# Patient Record
Sex: Female | Born: 1960 | Race: Black or African American | Hispanic: No | Marital: Married | Smoking: Never smoker
Health system: Southern US, Community
[De-identification: ages and names within clinical notes are randomized; demographics above are authoritative.]

## PROBLEM LIST (undated history)

## (undated) DIAGNOSIS — N809 Endometriosis, unspecified: Secondary | ICD-10-CM

## (undated) DIAGNOSIS — C801 Malignant (primary) neoplasm, unspecified: Secondary | ICD-10-CM

## (undated) DIAGNOSIS — D569 Thalassemia, unspecified: Secondary | ICD-10-CM

## (undated) DIAGNOSIS — I10 Essential (primary) hypertension: Secondary | ICD-10-CM

## (undated) HISTORY — PX: MYOMECTOMY: SHX85

## (undated) HISTORY — PX: DIAGNOSTIC LAPAROSCOPY: SUR761

---

## 2015-04-04 ENCOUNTER — Other Ambulatory Visit: Payer: Self-pay | Admitting: Gastroenterology

## 2015-04-04 DIAGNOSIS — K6389 Other specified diseases of intestine: Secondary | ICD-10-CM

## 2015-04-06 ENCOUNTER — Encounter (HOSPITAL_COMMUNITY): Payer: Self-pay | Admitting: Emergency Medicine

## 2015-04-06 ENCOUNTER — Telehealth: Payer: Self-pay | Admitting: Internal Medicine

## 2015-04-06 ENCOUNTER — Inpatient Hospital Stay (HOSPITAL_COMMUNITY)
Admission: EM | Admit: 2015-04-06 | Discharge: 2015-04-11 | DRG: 330 | Disposition: A | Discharge status: Home / Self Care | Payer: BLUE CROSS/BLUE SHIELD | Attending: Internal Medicine | Admitting: Internal Medicine

## 2015-04-06 ENCOUNTER — Emergency Department (HOSPITAL_COMMUNITY): Payer: BLUE CROSS/BLUE SHIELD

## 2015-04-06 ENCOUNTER — Inpatient Hospital Stay (HOSPITAL_COMMUNITY): Payer: BLUE CROSS/BLUE SHIELD

## 2015-04-06 DIAGNOSIS — R1110 Vomiting, unspecified: Secondary | ICD-10-CM

## 2015-04-06 DIAGNOSIS — Z886 Allergy status to analgesic agent status: Secondary | ICD-10-CM | POA: Diagnosis not present

## 2015-04-06 DIAGNOSIS — R1909 Other intra-abdominal and pelvic swelling, mass and lump: Secondary | ICD-10-CM | POA: Diagnosis present

## 2015-04-06 DIAGNOSIS — K660 Peritoneal adhesions (postprocedural) (postinfection): Secondary | ICD-10-CM | POA: Diagnosis present

## 2015-04-06 DIAGNOSIS — K769 Liver disease, unspecified: Secondary | ICD-10-CM

## 2015-04-06 DIAGNOSIS — D509 Iron deficiency anemia, unspecified: Secondary | ICD-10-CM | POA: Diagnosis present

## 2015-04-06 DIAGNOSIS — D259 Leiomyoma of uterus, unspecified: Secondary | ICD-10-CM | POA: Diagnosis present

## 2015-04-06 DIAGNOSIS — Z91011 Allergy to milk products: Secondary | ICD-10-CM

## 2015-04-06 DIAGNOSIS — C787 Secondary malignant neoplasm of liver and intrahepatic bile duct: Secondary | ICD-10-CM

## 2015-04-06 DIAGNOSIS — C19 Malignant neoplasm of rectosigmoid junction: Secondary | ICD-10-CM | POA: Diagnosis present

## 2015-04-06 DIAGNOSIS — C801 Malignant (primary) neoplasm, unspecified: Secondary | ICD-10-CM

## 2015-04-06 DIAGNOSIS — R1032 Left lower quadrant pain: Secondary | ICD-10-CM | POA: Diagnosis present

## 2015-04-06 DIAGNOSIS — I10 Essential (primary) hypertension: Secondary | ICD-10-CM | POA: Diagnosis present

## 2015-04-06 DIAGNOSIS — C189 Malignant neoplasm of colon, unspecified: Secondary | ICD-10-CM | POA: Diagnosis present

## 2015-04-06 DIAGNOSIS — N809 Endometriosis, unspecified: Secondary | ICD-10-CM | POA: Diagnosis present

## 2015-04-06 DIAGNOSIS — K625 Hemorrhage of anus and rectum: Secondary | ICD-10-CM | POA: Diagnosis present

## 2015-04-06 DIAGNOSIS — R1900 Intra-abdominal and pelvic swelling, mass and lump, unspecified site: Secondary | ICD-10-CM | POA: Diagnosis present

## 2015-04-06 DIAGNOSIS — D563 Thalassemia minor: Secondary | ICD-10-CM | POA: Diagnosis present

## 2015-04-06 DIAGNOSIS — K6389 Other specified diseases of intestine: Secondary | ICD-10-CM

## 2015-04-06 DIAGNOSIS — R630 Anorexia: Secondary | ICD-10-CM | POA: Diagnosis present

## 2015-04-06 DIAGNOSIS — Z79899 Other long term (current) drug therapy: Secondary | ICD-10-CM

## 2015-04-06 DIAGNOSIS — Z830 Family history of human immunodeficiency virus [HIV] disease: Secondary | ICD-10-CM | POA: Diagnosis not present

## 2015-04-06 DIAGNOSIS — C187 Malignant neoplasm of sigmoid colon: Secondary | ICD-10-CM

## 2015-04-06 DIAGNOSIS — E876 Hypokalemia: Secondary | ICD-10-CM | POA: Diagnosis present

## 2015-04-06 DIAGNOSIS — R112 Nausea with vomiting, unspecified: Secondary | ICD-10-CM

## 2015-04-06 DIAGNOSIS — D500 Iron deficiency anemia secondary to blood loss (chronic): Secondary | ICD-10-CM | POA: Diagnosis present

## 2015-04-06 DIAGNOSIS — D569 Thalassemia, unspecified: Secondary | ICD-10-CM | POA: Diagnosis present

## 2015-04-06 DIAGNOSIS — D649 Anemia, unspecified: Secondary | ICD-10-CM

## 2015-04-06 DIAGNOSIS — Z95828 Presence of other vascular implants and grafts: Secondary | ICD-10-CM

## 2015-04-06 DIAGNOSIS — Z800 Family history of malignant neoplasm of digestive organs: Secondary | ICD-10-CM

## 2015-04-06 HISTORY — DX: Thalassemia, unspecified: D56.9

## 2015-04-06 HISTORY — DX: Endometriosis, unspecified: N80.9

## 2015-04-06 HISTORY — DX: Essential (primary) hypertension: I10

## 2015-04-06 LAB — FOLATE: FOLATE: 32.7 ng/mL (ref >5.9)

## 2015-04-06 LAB — COMPREHENSIVE METABOLIC PANEL
ALT: 10 U/L — AB (ref 14–54)
AST: 22 U/L (ref 15–41)
Albumin: 3.9 g/dL (ref 3.5–5.0)
Alkaline Phosphatase: 63 U/L (ref 38–126)
Anion gap: 11 (ref 5–15)
BUN: 10 mg/dL (ref 6–20)
CHLORIDE: 104 mmol/L (ref 101–111)
CO2: 24 mmol/L (ref 22–32)
CREATININE: 0.9 mg/dL (ref 0.44–1.00)
Calcium: 9.5 mg/dL (ref 8.9–10.3)
GFR calc non Af Amer: >60 mL/min (ref >60)
Glucose, Bld: 105 mg/dL — ABNORMAL HIGH (ref 65–99)
Potassium: 3.2 mmol/L — ABNORMAL LOW (ref 3.5–5.1)
SODIUM: 139 mmol/L (ref 135–145)
Total Bilirubin: 0.8 mg/dL (ref 0.3–1.2)
Total Protein: 7.7 g/dL (ref 6.5–8.1)

## 2015-04-06 LAB — FERRITIN: Ferritin: 8 ng/mL — ABNORMAL LOW (ref 11–307)

## 2015-04-06 LAB — CBC WITH DIFFERENTIAL/PLATELET
BASOS ABS: 0 10*3/uL (ref 0.0–0.1)
Basophils Relative: 0 % (ref 0–1)
EOS ABS: 0.1 10*3/uL (ref 0.0–0.7)
Eosinophils Relative: 1 % (ref 0–5)
HCT: 32.6 % — ABNORMAL LOW (ref 36.0–46.0)
Hemoglobin: 9.6 g/dL — ABNORMAL LOW (ref 12.0–15.0)
LYMPHS PCT: 9 % — AB (ref 12–46)
Lymphs Abs: 0.7 10*3/uL (ref 0.7–4.0)
MCH: 17.5 pg — ABNORMAL LOW (ref 26.0–34.0)
MCHC: 29.4 g/dL — ABNORMAL LOW (ref 30.0–36.0)
MCV: 59.5 fL — ABNORMAL LOW (ref 78.0–100.0)
Monocytes Absolute: 0.3 10*3/uL (ref 0.1–1.0)
Monocytes Relative: 4 % (ref 3–12)
NEUTROS PCT: 86 % — AB (ref 43–77)
Neutro Abs: 6.6 10*3/uL (ref 1.7–7.7)
PLATELETS: 735 10*3/uL — AB (ref 150–400)
RBC: 5.48 MIL/uL — ABNORMAL HIGH (ref 3.87–5.11)
RDW: 29.4 % — ABNORMAL HIGH (ref 11.5–15.5)
WBC: 7.7 10*3/uL (ref 4.0–10.5)

## 2015-04-06 LAB — RETICULOCYTES: RBC.: 5.49 MIL/uL — ABNORMAL HIGH (ref 3.87–5.11)

## 2015-04-06 LAB — VITAMIN B12: VITAMIN B 12: 636 pg/mL (ref 180–914)

## 2015-04-06 LAB — LIPASE, BLOOD: Lipase: 15 U/L — ABNORMAL LOW (ref 22–51)

## 2015-04-06 LAB — IRON AND TIBC
Iron: 17 ug/dL — ABNORMAL LOW (ref 28–170)
Saturation Ratios: 4 % — ABNORMAL LOW (ref 10.4–31.8)
TIBC: 406 ug/dL (ref 250–450)
UIBC: 389 ug/dL

## 2015-04-06 LAB — AMMONIA: AMMONIA: 17 umol/L (ref 9–35)

## 2015-04-06 MED ORDER — SODIUM CHLORIDE 0.9 % IV SOLN: INTRAVENOUS | Status: DC

## 2015-04-06 MED ORDER — HYDROCODONE-ACETAMINOPHEN 5-325 MG PO TABS: 1.0000 | ORAL_TABLET | ORAL | Status: DC | PRN

## 2015-04-06 MED ORDER — ONDANSETRON HCL 4 MG PO TABS
4.0000 mg | ORAL_TABLET | Freq: Four times a day (QID) | ORAL | Status: DC | PRN
  Filled 2015-04-06: qty 1

## 2015-04-06 MED ORDER — ONDANSETRON HCL 4 MG/2ML IJ SOLN: 4.0000 mg | Freq: Three times a day (TID) | INTRAMUSCULAR | Status: DC | PRN

## 2015-04-06 MED ORDER — ACETAMINOPHEN 650 MG RE SUPP: 650.0000 mg | Freq: Four times a day (QID) | RECTAL | Status: DC | PRN

## 2015-04-06 MED ORDER — IOHEXOL 300 MG/ML  SOLN
100.0000 mL | Freq: Once | INTRAMUSCULAR | Status: AC | PRN
  Administered 2015-04-06: 100 mL via INTRAVENOUS

## 2015-04-06 MED ORDER — HYDROMORPHONE HCL 1 MG/ML IJ SOLN: 1.0000 mg | INTRAMUSCULAR | Status: DC | PRN

## 2015-04-06 MED ORDER — NIFEDIPINE ER 30 MG PO TB24
30.0000 mg | ORAL_TABLET | Freq: Every day | ORAL | Status: DC
  Administered 2015-04-07 – 2015-04-10 (×3): 30 mg via ORAL
  Filled 2015-04-06 (×5): qty 1

## 2015-04-06 MED ORDER — ONDANSETRON HCL 4 MG/2ML IJ SOLN
4.0000 mg | Freq: Once | INTRAMUSCULAR | Status: AC
  Administered 2015-04-06: 4 mg via INTRAVENOUS
  Filled 2015-04-06: qty 2

## 2015-04-06 MED ORDER — SODIUM CHLORIDE 0.9 % IV SOLN
INTRAVENOUS | Status: DC
  Administered 2015-04-06 – 2015-04-07 (×3): via INTRAVENOUS

## 2015-04-06 MED ORDER — ONDANSETRON HCL 4 MG/2ML IJ SOLN
4.0000 mg | Freq: Four times a day (QID) | INTRAMUSCULAR | Status: DC | PRN
  Administered 2015-04-07 (×3): 4 mg via INTRAVENOUS
  Filled 2015-04-06 (×3): qty 2

## 2015-04-06 MED ORDER — POTASSIUM CHLORIDE 10 MEQ/100ML IV SOLN
10.0000 meq | INTRAVENOUS | Status: AC
  Administered 2015-04-06 (×3): 10 meq via INTRAVENOUS
  Filled 2015-04-06 (×2): qty 100

## 2015-04-06 MED ORDER — SODIUM CHLORIDE 0.9 % IJ SOLN
3.0000 mL | Freq: Two times a day (BID) | INTRAMUSCULAR | Status: DC
  Administered 2015-04-07 – 2015-04-09 (×3): 3 mL via INTRAVENOUS

## 2015-04-06 MED ORDER — SODIUM CHLORIDE 0.9 % IV BOLUS (SEPSIS)
1000.0000 mL | Freq: Once | INTRAVENOUS | Status: AC
  Administered 2015-04-06: 1000 mL via INTRAVENOUS

## 2015-04-06 MED ORDER — IOHEXOL 300 MG/ML  SOLN
50.0000 mL | Freq: Once | INTRAMUSCULAR | Status: AC | PRN
  Administered 2015-04-06: 50 mL via ORAL

## 2015-04-06 MED ORDER — ACETAMINOPHEN 325 MG PO TABS
650.0000 mg | ORAL_TABLET | Freq: Four times a day (QID) | ORAL | Status: DC | PRN
  Administered 2015-04-06 – 2015-04-09 (×3): 650 mg via ORAL
  Filled 2015-04-06 (×4): qty 2

## 2015-04-06 NOTE — Consult Note (Signed)
[place]  [phone]) [phone] [phone]  Inpatient New Consult Note   Patient Care Team: Benito Mccreedy, MD as PCP - General (Internal Medicine) Juanita Craver, MD as Consulting Physician (Gastroenterology) 04/06/2015  Referring physician: Dr. Tana Coast  CHIEF COMPLAINTS/PURPOSE OF CONSULTATION:  Rectal bleeding  HISTORY OF PRESENTING ILLNESS:  Christina [name] 54 y.o. female with past medical history of endometriosis, uterine fibroids, thalassemia trait, iron deficient anemia, who presents to the [facility] emergency room for worsening nausea and vomiting. She was recently found to have a sigmoid rectal mass.   She has been having intermittent rectal bleeding for the past 3 months. She thought it was related to her endometriosis, did not seek medical attention. She also has intermittent mild nausea, especially in the morning, and left lower quadrant abdominal pain. She was found to have profound anemia with hemoglobin 6.5 last week, and received blood transfusion. She was referred to gastroenterologist Dr. Collene Mares last week, underwent colonoscopy 2 days ago, which showed a obstructing sigmoid rectal mass, per patient, the colonoscopy report is not available today. Biopsy was done, but the pathology report is still pending.  She developed severe nausea, and vomited several times with clear gastric liquid. She called [name], and I was told to come to [facility] emergency room by on-call physician Dr. Carlean Purl.  She had a CT scan done in the emergency room today.  Her appetite was fairly normal up to 2 days ago, when she was found to have a colorectal mass. She lost about 45 pounds in the past few weeks. 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  family history was positive for colon cancer in her maternal cousin. She is married, lives with her husband, no children.  MEDICAL HISTORY:  Past Medical History    Diagnosis Date  . Hypertension   . Endometriosis   . Thalassanemia     SURGICAL HISTORY: History reviewed. No pertinent past surgical history.  SOCIAL HISTORY: Social History   Social History  . Marital Status: Married    Spouse Name: N/A  . Number of Children: N/A  . Years of Education: N/A   Occupational History  . Not on file.   Social History Main Topics  . Smoking status: Never Smoker   . Smokeless tobacco: Not on file  . Alcohol Use: Yes  . Drug Use: No  . Sexual Activity: Not on file   Other Topics Concern  . Not on file   Social History Narrative  . No narrative on file    FAMILY HISTORY: History reviewed. No pertinent family history.  ALLERGIES:  is allergic to milk-related compounds and nsaids.  MEDICATIONS:  Current Facility-Administered Medications  Medication Dose Route Frequency Provider Last Rate Last Dose  . 0.9 %  sodium chloride infusion   Intravenous Continuous Ripudeep Krystal Eaton, MD 75 mL/hr at 04/06/15 1424    . acetaminophen (TYLENOL) tablet 650 mg  650 mg Oral Q6H PRN Ripudeep Krystal Eaton, MD   650 mg at 04/06/15 1522   Or  . acetaminophen (TYLENOL) suppository 650 mg  650 mg Rectal Q6H PRN Ripudeep Krystal Eaton, MD      . HYDROcodone-acetaminophen (NORCO/VICODIN) 5-325 MG per tablet 1 tablet  1 tablet Oral Q4H PRN Ripudeep K Rai, MD      . HYDROmorphone (DILAUDID) injection 1 mg  1 mg Intravenous Q4H PRN Ripudeep Krystal Eaton, MD      . Derrill Memo ON 04/07/2015] NIFEdipine (  PROCARDIA-XL/ADALAT CC) 24 hr tablet 30 mg  30 mg Oral Daily Ripudeep K Rai, MD      . ondansetron (ZOFRAN) tablet 4 mg  4 mg Oral Q6H PRN Ripudeep Krystal Eaton, MD       Or  . ondansetron (ZOFRAN) injection 4 mg  4 mg Intravenous Q6H PRN Ripudeep K Rai, MD      . potassium chloride 10 mEq in 100 mL IVPB  10 mEq Intravenous Q1 Hr x 3 Ripudeep K Rai, MD 100 mL/hr at 04/06/15 1514 10 mEq at 04/06/15 1514  . sodium chloride 0.9 % injection 3 mL  3 mL Intravenous Q12H Ripudeep K Rai, MD   3 mL at 04/06/15  1430    REVIEW OF SYSTEMS:   Constitutional: Denies fevers, chills or abnormal night sweats Eyes: Denies blurriness of vision, double vision or watery eyes Ears, nose, mouth, throat, and face: Denies mucositis or sore throat Respiratory: Denies cough, dyspnea or wheezes Cardiovascular: Denies palpitation, chest discomfort or lower extremity swelling Gastrointestinal:  Denies nausea, heartburn or change in bowel habits Skin: Denies abnormal skin rashes Lymphatics: Denies new lymphadenopathy or easy bruising Neurological:Denies numbness, tingling or new weaknesses Behavioral/Psych: Mood is stable, no new changes  All other systems were reviewed with the patient and are negative.  PHYSICAL EXAMINATION: ECOG PERFORMANCE STATUS: 1 - Symptomatic but completely ambulatory  Filed Vitals:   04/06/15 1330  BP: 147/72  Pulse: 78  Temp: 98.6 F (37 C)  Resp: 20   Filed Weights   04/06/15 0912 04/06/15 1330  Weight: 160 lb (72.576 kg) 160 lb (72.576 kg)    GENERAL:alert, no distress and comfortable SKIN: skin color, texture, turgor are normal, no rashes or significant lesions EYES: normal, conjunctiva are pink and non-injected, sclera clear OROPHARYNX:no exudate, no erythema and lips, buccal mucosa, and tongue normal  NECK: supple, thyroid normal size, non-tender, without nodularity LYMPH:  no palpable lymphadenopathy in the cervical, axillary or inguinal LUNGS: clear to auscultation and percussion with normal breathing effort HEART: regular rate & rhythm and no murmurs and no lower extremity edema ABDOMEN:abdomen soft, non-tender and normal bowel sounds Musculoskeletal:no cyanosis of digits and no clubbing  PSYCH: alert & oriented x 3 with fluent speech NEURO: no focal motor/sensory deficits  LABORATORY DATA:  I have reviewed the data as listed Lab Results  Component Value Date   WBC 7.7 04/06/2015   HGB 9.6* 04/06/2015   HCT 32.6* 04/06/2015   MCV 59.5* 04/06/2015   PLT 735*  04/06/2015    Recent Labs  04/06/15 0945  NA 139  K 3.2*  CL 104  CO2 24  GLUCOSE 105*  BUN 10  CREATININE 0.90  CALCIUM 9.5  GFRNONAA >60  GFRAA >60  PROT 7.7  ALBUMIN 3.9  AST 22  ALT 10*  ALKPHOS 63  BILITOT 0.8    RADIOGRAPHIC STUDIES: I have personally reviewed the radiological images as listed and agreed with the findings in the report. Ct Chest W Contrast  04/06/2015   ADDENDUM REPORT: 04/06/2015 13:18  ADDENDUM: Upon further evaluation, The masslike lesion in the pelvis previously question matted small bowel loops represent 9 cm sigmoid colon cancer.   Electronically Signed   By: Abelardo Diesel M.D.   On: 04/06/2015 13:18   04/06/2015   CLINICAL DATA:  Vomiting.  EXAM: CT CHEST, ABDOMEN, AND PELVIS WITH CONTRAST  TECHNIQUE: Multidetector CT imaging of the chest, abdomen and pelvis was performed following the standard protocol during bolus administration of intravenous  contrast.  CONTRAST:  24mL OMNIPAQUE IOHEXOL 300 MG/ML SOLN, 158mL OMNIPAQUE IOHEXOL 300 MG/ML SOLN  COMPARISON:  None.  FINDINGS: CT CHEST FINDINGS  Mediastinum/Nodes: There is no mediastinal or hilar lymphadenopathy. There is no mediastinal hematoma. The heart size is normal. There is no pericardial effusion.  Lungs/Pleura: There is no pulmonary mass or nodule. No focal pneumonia or pleural effusion is identified.  Musculoskeletal: No acute abnormality is identified within the visualized bones.  CT ABDOMEN PELVIS FINDINGS  Hepatobiliary: There is a 9.1 x 6.1 cm heterogeneously enhancing mass in the left lobe liver. There is a 0.9 x 1.6 cm mild enhancing mass in the dome of the liver. There is a 1.9 x 1.6 cm enhancing mass at the junction of caudate and right lobe liver. A 3 mm low density lesion is identified in the inferior left lobe liver. The gallbladder is normal.  Pancreas: Normal.  Spleen: Normal.  Adrenals/Urinary Tract: The adrenal glands are normal. They kidneys are normal. There is no nephrolithiasis or  hydroureteronephrosis bilaterally.  Stomach/Bowel: There is a question 5.8 cm conglomerate of matted bowel loops in the mid lower abdomen. There is no small bowel obstruction. The colon is normal.  Vascular/Lymphatic: The aorta is normal. There is no aneurysmal dilatation of the aorta. There is no abdominal lymphadenopathy.  Reproductive: There is a 6 x 6.9 cm heterogeneously enhancing mass question arising from the uterus. A second mass, inseparable from the uterus that is lower density than adjacent uterine myometrium measures 5.7 x 7.8 cm.  Other: None.  Musculoskeletal: No focal discrete lytic or blastic lesion is identified within the visualized bones.  IMPRESSION: No acute abnormality identified within the chest. No evidence of metastatic disease.  Several enhancing masses in the liver consistent with liver metastasis.  Masses within the pelvis likely of the Uterine origin. Given the findings within the liver, malignant neoplasm is not excluded.  A conglomerate of matted bowel loops in the mid lower abdomen. There is no evidence of small bowel obstruction.  Electronically Signed: By: Abelardo Diesel M.D. On: 04/06/2015 11:49   Ct Abdomen Pelvis W Contrast  04/06/2015   ADDENDUM REPORT: 04/06/2015 13:18  ADDENDUM: Upon further evaluation, The masslike lesion in the pelvis previously question matted small bowel loops represent 9 cm sigmoid colon cancer.   Electronically Signed   By: Abelardo Diesel M.D.   On: 04/06/2015 13:18   04/06/2015   CLINICAL DATA:  Vomiting.  EXAM: CT CHEST, ABDOMEN, AND PELVIS WITH CONTRAST  TECHNIQUE: Multidetector CT imaging of the chest, abdomen and pelvis was performed following the standard protocol during bolus administration of intravenous contrast.  CONTRAST:  23mL OMNIPAQUE IOHEXOL 300 MG/ML SOLN, 144mL OMNIPAQUE IOHEXOL 300 MG/ML SOLN  COMPARISON:  None.  FINDINGS: CT CHEST FINDINGS  Mediastinum/Nodes: There is no mediastinal or hilar lymphadenopathy. There is no mediastinal  hematoma. The heart size is normal. There is no pericardial effusion.  Lungs/Pleura: There is no pulmonary mass or nodule. No focal pneumonia or pleural effusion is identified.  Musculoskeletal: No acute abnormality is identified within the visualized bones.  CT ABDOMEN PELVIS FINDINGS  Hepatobiliary: There is a 9.1 x 6.1 cm heterogeneously enhancing mass in the left lobe liver. There is a 0.9 x 1.6 cm mild enhancing mass in the dome of the liver. There is a 1.9 x 1.6 cm enhancing mass at the junction of caudate and right lobe liver. A 3 mm low density lesion is identified in the inferior left lobe liver. The gallbladder  is normal.  Pancreas: Normal.  Spleen: Normal.  Adrenals/Urinary Tract: The adrenal glands are normal. They kidneys are normal. There is no nephrolithiasis or hydroureteronephrosis bilaterally.  Stomach/Bowel: There is a question 5.8 cm conglomerate of matted bowel loops in the mid lower abdomen. There is no small bowel obstruction. The colon is normal.  Vascular/Lymphatic: The aorta is normal. There is no aneurysmal dilatation of the aorta. There is no abdominal lymphadenopathy.  Reproductive: There is a 6 x 6.9 cm heterogeneously enhancing mass question arising from the uterus. A second mass, inseparable from the uterus that is lower density than adjacent uterine myometrium measures 5.7 x 7.8 cm.  Other: None.  Musculoskeletal: No focal discrete lytic or blastic lesion is identified within the visualized bones.  IMPRESSION: No acute abnormality identified within the chest. No evidence of metastatic disease.  Several enhancing masses in the liver consistent with liver metastasis.  Masses within the pelvis likely of the Uterine origin. Given the findings within the liver, malignant neoplasm is not excluded.  A conglomerate of matted bowel loops in the mid lower abdomen. There is no evidence of small bowel obstruction.  Electronically Signed: By: Abelardo Diesel M.D. On: 04/06/2015 11:49     ASSESSMENT & PLAN:   54 year old African-American female, with past medical history of hypertension, thalassemia trait, iron deficient anemia, no history of uterine fibroids, endometriosis, who presents with intermittent rectal bleeding, mild nausea, and mild intermittent left lower quadrant abdominal pain.   1. 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 Sharrie Rothman today. The large pelvic masses are likely uterine or fibroids, benign. The sigmoid rectal mass measures about 3.4 x 7 cm, with extranodal chronic extension. Her liver lesions are very concerning for metastasis. -I'll obtain her sigmoid colon mass biopsy result tomorrow. Although this is likely adenocarcinoma, neuroendocrine tumor is also a small possibility. -We have checked her tumor markers including CEA. -I recommend liver biopsy to confirm the liver metastasis. -We discussed the indication for surgery in stage IV colon cancer, including obstruction and severe bleeding. She does not have images findings or clinical symptom of bowel obstruction, but Dr. Collene Mares was not able to pass the scope during the colonoscopy, she may need standing or pedal surgery if she develops clinical symptoms of obstruction. -We also discussed palliative radiation can be considered for bleeding and obstruction. -Given the likely stage IV disease, she would need systemic chemotherapy. I'll wait for the final pathology result, and go over the chemotherapy regimen with her.  2. Anemia -Secondary to GI bleeding and iron deficiency -Ferritin level is pending, I will likely give her dose of ferriheme 510 mg when she is in hospital  Recommendations -I'll discuss with Dr. Shane Crutch, and GI to see if she would need stenting or surgery for her obstructing sigmoid rectal mass -liver biopsy by IR to confirm metastasis  -I will follow her colon mass biopsy pathology result -if biopsy confirmed  adenocarcinoma, I would need a port placement to start her chemotherapy -If ferritin is low, please give 1 dose of ferriheme 510 mg  I will follow up closely  All questions were answered. The patient knows to call the clinic with any problems, questions or concerns. I spent 55 minutes counseling the patient face to face. The total time spent in the appointment was 60 minutes and more than 50% was on counseling.     Truitt Merle, MD 04/06/2015 4:07 PM

## 2015-04-06 NOTE — Telephone Encounter (Signed)
Called [phone] - has had 3 episodes of vomiting last night into this AM, no bleeding, mild abd pain, no fever  Had colonoscopy 2 d ago Dr. Collene Mares  Per pt - scope would not pass through a mass   Told her to go to [facility] ED for eval

## 2015-04-06 NOTE — ED Notes (Signed)
Patient c/o vomiting that started last pm, patient reports that she is having "soft stools", and pain in her lt lower abd and lower back. Patient had colonoscopy on Friday that showed a mass in her sigmoid colon. Patient was advised by MD on call at [facility] Endoscopy clinic to come to ED.

## 2015-04-06 NOTE — H&P (Signed)
History and Physical        Hospital Admission Note Date: 04/06/2015  Patient name: Christina [name] Medical record number: [id number] Date of birth: 04/19/1960 Age: 54 y.o. Gender: female  PCP: No primary care provider on file.  Referring physician: Dr Audie Pinto   Chief Complaint:  Nausea, vomiting, rectal bleeding  HPI: Patient is a 54 year old female with hypertension, endometriosis, chronic anemia presented to ED with complaints of intractable nausea and vomiting that started last night. History was obtained from the patient and her husband in the room. Patient reported that she has been having rectal bleeding off and on for last 3 months. Patient was seen by gastroenterology, Dr. Collene Mares, underwent colonoscopy on 8/26 and Dr. Collene Mares was unable to pass the scope through the large nearly obstructing mass. Dr. Collene Mares had contacted surgery, biopsies were obtained of the mass, patient was scheduled to have CT scan of the abdomen on 8/29. However overnight patient started having several episodes of nausea and vomiting and presented to [facility] after the recommendations from Dr. Carlean Purl for further workup. At the time of my examination, patient complains of left lower quadrant abdominal pain, 6/10, not radiating, associated with nausea. Currently no vomiting, fevers or chills. Patient reports that she had loss of appetite in the last 1 week and may have lost about 5 pounds. She has a history of colon cancer in one of her cousins, otherwise not in her immediate family and siblings. ED workup showed sodium of 139, potassium 3.2, creatinine 0.9 every BC 7.7 hemoglobin 9.6, platelets 735. Per Dr. Collene Mares, patient had received 2 units of packed RBC transfusion last week in [place] for the hemoglobin of 6.2, confirmed from the patient. CT of the abdomen/pelvis showed several enhancing masses in the  liver consistent with liver metastases, masses in the pelvis likely of uterine origin, 9 cm sigmoid colon mass.    Review of Systems:  Constitutional: Denies fever, chills, diaphoresis, + poor appetite and fatigue.  HEENT: Denies photophobia, eye pain, redness, hearing loss, ear pain, congestion, sore throat, rhinorrhea, sneezing, mouth sores, trouble swallowing, neck pain, neck stiffness and tinnitus.   Respiratory: Denies SOB, DOE, cough, chest tightness,  and wheezing.   Cardiovascular: Denies chest pain, palpitations and leg swelling.  Gastrointestinal: Please see history of present illness  Genitourinary: Denies dysuria, urgency, frequency, hematuria, flank pain and difficulty urinating.  Musculoskeletal: Denies myalgias, back pain, joint swelling, arthralgias and gait problem.  Skin: Denies pallor, rash and wound.  Neurological: Denies dizziness, seizures, syncope, weakness, light-headedness, numbness and headaches.  Hematological: Denies adenopathy. Easy bruising, personal or family bleeding history  Psychiatric/Behavioral: Denies suicidal ideation, mood changes, confusion, nervousness, sleep disturbance and agitation  Past Medical History: Past Medical History  Diagnosis Date  . Hypertension   . Endometriosis   . Thalassanemia     History reviewed. No pertinent past surgical history.  Medications: Prior to Admission medications   Medication Sig Start Date End Date Taking? Authorizing Provider  NIFEdipine (PROCARDIA-XL/ADALAT CC) 30 MG 24 hr tablet Take 30 mg by mouth daily.   Yes Historical Provider, MD    Allergies:   Allergies  Allergen Reactions  . Milk-Related Compounds Diarrhea  .  Nsaids Other (See Comments)    Heavy menstrual bleeding     Social History:  reports that she has never smoked. She does not have any smokeless tobacco history on file. She reports that she drinks alcohol occasionally. She reports that she does not use illicit drugs.  Family  History: Patient reports no history of any GI or reproductive cancers in her immediate family or siblings. History of colon cancer in one of her cousins.  Physical Exam: Blood pressure 147/75, pulse 80, temperature 98.2 F (36.8 C), temperature source Oral, resp. rate 18, height 5\' 6"  (1.676 m), weight 72.576 kg (160 lb), SpO2 100 %. General: Alert, awake, oriented x3, in no acute distress. HEENT: normocephalic, atraumatic, anicteric sclera, pink conjunctiva, pupils equal and reactive to light and accomodation, oropharynx clear Neck: supple, no masses or lymphadenopathy, no goiter, no bruits  Heart: Regular rate and rhythm, without murmurs, rubs or gallops. Lungs: Clear to auscultation bilaterally, no wheezing, rales or rhonchi. Abdomen: Soft, mild tenderness in the left lower quadrant to deep palpation, nondistended, positive bowel sounds, no masses. Extremities: No clubbing, cyanosis or edema with positive pedal pulses. Neuro: Grossly intact, no focal neurological deficits, strength 5/5 upper and lower extremities bilaterally Psych: alert and oriented x 3, normal mood and affect Skin: no rashes or lesions, warm and dry   LABS on Admission:  Basic Metabolic Panel:  Recent Labs Lab 04/06/15 0945  NA 139  K 3.2*  CL 104  CO2 24  GLUCOSE 105*  BUN 10  CREATININE 0.90  CALCIUM 9.5   Liver Function Tests:  Recent Labs Lab 04/06/15 0945  AST 22  ALT 10*  ALKPHOS 63  BILITOT 0.8  PROT 7.7  ALBUMIN 3.9    Recent Labs Lab 04/06/15 0945  LIPASE 15*   No results for input(s): AMMONIA in the last 168 hours. CBC:  Recent Labs Lab 04/06/15 0945  WBC 7.7  NEUTROABS 6.6  HGB 9.6*  HCT 32.6*  MCV 59.5*  PLT 735*   Cardiac Enzymes: No results for input(s): CKTOTAL, CKMB, CKMBINDEX, TROPONINI in the last 168 hours. BNP: Invalid input(s): POCBNP CBG: No results for input(s): GLUCAP in the last 168 hours.  Radiological Exams on Admission:  Ct Chest W  Contrast  04/06/2015   ADDENDUM REPORT: 04/06/2015 13:18  ADDENDUM: Upon further evaluation, The masslike lesion in the pelvis previously question matted small bowel loops represent 9 cm sigmoid colon cancer.   Electronically Signed   By: Abelardo Diesel M.D.   On: 04/06/2015 13:18   04/06/2015   CLINICAL DATA:  Vomiting.  EXAM: CT CHEST, ABDOMEN, AND PELVIS WITH CONTRAST  TECHNIQUE: Multidetector CT imaging of the chest, abdomen and pelvis was performed following the standard protocol during bolus administration of intravenous contrast.  CONTRAST:  68mL OMNIPAQUE IOHEXOL 300 MG/ML SOLN, 127mL OMNIPAQUE IOHEXOL 300 MG/ML SOLN  COMPARISON:  None.  FINDINGS: CT CHEST FINDINGS  Mediastinum/Nodes: There is no mediastinal or hilar lymphadenopathy. There is no mediastinal hematoma. The heart size is normal. There is no pericardial effusion.  Lungs/Pleura: There is no pulmonary mass or nodule. No focal pneumonia or pleural effusion is identified.  Musculoskeletal: No acute abnormality is identified within the visualized bones.  CT ABDOMEN PELVIS FINDINGS  Hepatobiliary: There is a 9.1 x 6.1 cm heterogeneously enhancing mass in the left lobe liver. There is a 0.9 x 1.6 cm mild enhancing mass in the dome of the liver. There is a 1.9 x 1.6 cm enhancing mass at the junction  of caudate and right lobe liver. A 3 mm low density lesion is identified in the inferior left lobe liver. The gallbladder is normal.  Pancreas: Normal.  Spleen: Normal.  Adrenals/Urinary Tract: The adrenal glands are normal. They kidneys are normal. There is no nephrolithiasis or hydroureteronephrosis bilaterally.  Stomach/Bowel: There is a question 5.8 cm conglomerate of matted bowel loops in the mid lower abdomen. There is no small bowel obstruction. The colon is normal.  Vascular/Lymphatic: The aorta is normal. There is no aneurysmal dilatation of the aorta. There is no abdominal lymphadenopathy.  Reproductive: There is a 6 x 6.9 cm heterogeneously  enhancing mass question arising from the uterus. A second mass, inseparable from the uterus that is lower density than adjacent uterine myometrium measures 5.7 x 7.8 cm.  Other: None.  Musculoskeletal: No focal discrete lytic or blastic lesion is identified within the visualized bones.  IMPRESSION: No acute abnormality identified within the chest. No evidence of metastatic disease.  Several enhancing masses in the liver consistent with liver metastasis.  Masses within the pelvis likely of the Uterine origin. Given the findings within the liver, malignant neoplasm is not excluded.  A conglomerate of matted bowel loops in the mid lower abdomen. There is no evidence of small bowel obstruction.  Electronically Signed: By: Abelardo Diesel M.D. On: 04/06/2015 11:49   Ct Abdomen Pelvis W Contrast  04/06/2015   ADDENDUM REPORT: 04/06/2015 13:18  ADDENDUM: Upon further evaluation, The masslike lesion in the pelvis previously question matted small bowel loops represent 9 cm sigmoid colon cancer.   Electronically Signed   By: Abelardo Diesel M.D.   On: 04/06/2015 13:18   04/06/2015   CLINICAL DATA:  Vomiting.  EXAM: CT CHEST, ABDOMEN, AND PELVIS WITH CONTRAST  TECHNIQUE: Multidetector CT imaging of the chest, abdomen and pelvis was performed following the standard protocol during bolus administration of intravenous contrast.  CONTRAST:  2mL OMNIPAQUE IOHEXOL 300 MG/ML SOLN, 1104mL OMNIPAQUE IOHEXOL 300 MG/ML SOLN  COMPARISON:  None.  FINDINGS: CT CHEST FINDINGS  Mediastinum/Nodes: There is no mediastinal or hilar lymphadenopathy. There is no mediastinal hematoma. The heart size is normal. There is no pericardial effusion.  Lungs/Pleura: There is no pulmonary mass or nodule. No focal pneumonia or pleural effusion is identified.  Musculoskeletal: No acute abnormality is identified within the visualized bones.  CT ABDOMEN PELVIS FINDINGS  Hepatobiliary: There is a 9.1 x 6.1 cm heterogeneously enhancing mass in the left lobe  liver. There is a 0.9 x 1.6 cm mild enhancing mass in the dome of the liver. There is a 1.9 x 1.6 cm enhancing mass at the junction of caudate and right lobe liver. A 3 mm low density lesion is identified in the inferior left lobe liver. The gallbladder is normal.  Pancreas: Normal.  Spleen: Normal.  Adrenals/Urinary Tract: The adrenal glands are normal. They kidneys are normal. There is no nephrolithiasis or hydroureteronephrosis bilaterally.  Stomach/Bowel: There is a question 5.8 cm conglomerate of matted bowel loops in the mid lower abdomen. There is no small bowel obstruction. The colon is normal.  Vascular/Lymphatic: The aorta is normal. There is no aneurysmal dilatation of the aorta. There is no abdominal lymphadenopathy.  Reproductive: There is a 6 x 6.9 cm heterogeneously enhancing mass question arising from the uterus. A second mass, inseparable from the uterus that is lower density than adjacent uterine myometrium measures 5.7 x 7.8 cm.  Other: None.  Musculoskeletal: No focal discrete lytic or blastic lesion is identified within the  visualized bones.  IMPRESSION: No acute abnormality identified within the chest. No evidence of metastatic disease.  Several enhancing masses in the liver consistent with liver metastasis.  Masses within the pelvis likely of the Uterine origin. Given the findings within the liver, malignant neoplasm is not excluded.  A conglomerate of matted bowel loops in the mid lower abdomen. There is no evidence of small bowel obstruction.  Electronically Signed: By: Abelardo Diesel M.D. On: 04/06/2015 11:49    *I have personally reviewed the images above*    Assessment/Plan  Principal Problem:   Nausea & vomiting with colon mass newly diagnosed with liver metastasis - will admit for further workup, placed on clear liquid diet for now, IV fluid hydration, antiemetics - Discussed in detail with Dr. Collene Mares, Unable to read endoscopy report on the Epic. Per Dr. Collene Mares, patient had the  flexible sigmoidoscopy on 8/26 and had large nearly obstructing mass ~10cm in the rectosigmoid colon. Biopsies have been obtained. Dr. Collene Mares had already contacted general surgery to be seen on 8/31.  - I have consulted oncology, spoke with Dr. Burr Medico and general surgery, spoke with Dr. Lucia Gaskins  - There is a question of pelvic masses separately from the colon on the CT scan, I have ordered a pelvic and transvaginal ultrasound for further workup. Patient has long-standing history of endometriosis and fibroids - Ordered CEA, CA-19-9, CEA 125   Active Problems:   Pelvic mass, Liver metastasis - Patient has a long-standing history of endometriosis and fibroids, follow transvaginal and pelvic ultrasound for further workup to rule out separate GU malignancy     Hypokalemia likely due to nausea and vomiting - Replaced IV     Anemia - Patient appears to have anemia, but Dr. Collene Mares, had received 2 units of packed RBC transfusion last week with for a hemoglobin of 6.2  - Obtain anemia panel for further workup    essential hypertension Continue nifedipine   DVT prophylaxis:  SCDs   CODE STATUS:  full code   Family Communication: Admission, patients condition and plan of care including tests being ordered have been discussed with the patient and  Husband who indicates understanding and agree with the plan and Code Status  Disposition plan: Further plan will depend as patient's clinical course evolves and further radiologic and laboratory data become available.   Time Spent on Admission: 61 minutes   Trevar Boehringer M.D. [facility] 04/06/2015, 1:29 PM Pager: [phone]  If 7PM-7AM, please contact night-coverage [email or web address] Password [email or web address]

## 2015-04-06 NOTE — Progress Notes (Signed)
   Brief note - I spoke to patient on phone and had her come to ED. Obstructing sigmoid tumor - patient has report  CT suggests uterine and liver masses - ? Fibroids and liver mets from [facility] more likely [facility] to see I will let Dr. Collene Mares know she is here when I sign out.  Gatha Mayer, MD, [facility] [phone] (pager) 04/06/2015 1:41 PM

## 2015-04-06 NOTE — ED Provider Notes (Signed)
CSN: [id number]     Arrival date & time 04/06/15  0904 History   First MD Initiated Contact with Patient 04/06/15 0913     Chief Complaint  Patient presents with  . Emesis      HPI Patient c/o vomiting that started last pm, patient reports that she is having "soft stools", and pain in her lt lower abd and lower back. Patient had colonoscopy on Friday that showed a mass in her sigmoid colon. Patient was advised by MD on call at [facility] to come to ED.  Past Medical History  Diagnosis Date  . Hypertension   . Endometriosis   . Thalassanemia    History reviewed. No pertinent past surgical history. No family history on file. Social History  Substance Use Topics  . Smoking status: Never Smoker   . Smokeless tobacco: None  . Alcohol Use: Yes   OB History    No data available     Review of Systems  All other systems reviewed and are negative.     Allergies  Milk-related compounds and Nsaids  Home Medications   Prior to Admission medications   Medication Sig Start Date End Date Taking? Authorizing Provider  NIFEdipine (PROCARDIA-XL/ADALAT CC) 30 MG 24 hr tablet Take 30 mg by mouth daily.   Yes Historical Provider, MD   BP 147/75 mmHg  Pulse 80  Temp(Src) 98.2 F (36.8 C) (Oral)  Resp 18  Ht 5\' 6"  (1.676 m)  Wt 160 lb (72.576 kg)  BMI 25.84 kg/m2  SpO2 100% Physical Exam  Constitutional: She is oriented to person, place, and time. She appears well-developed and well-nourished. No distress.  HENT:  Head: Normocephalic and atraumatic.  Eyes: Pupils are equal, round, and reactive to light.  Neck: Normal range of motion.  Cardiovascular: Normal rate and intact distal pulses.   Pulmonary/Chest: No respiratory distress.  Abdominal: Normal appearance. She exhibits no distension. There is tenderness. There is no rebound and no guarding.    Musculoskeletal: Normal range of motion.  Neurological: She is alert and oriented to person, place, and time. No  cranial nerve deficit.  Skin: Skin is warm and dry. No rash noted.  Psychiatric: She has a normal mood and affect. Her behavior is normal.  Nursing note and vitals reviewed.   ED Course  Procedures (including critical care time) Medications  sodium chloride 0.9 % bolus 1,000 mL (0 mLs Intravenous Stopped 04/06/15 1017)  ondansetron (ZOFRAN) injection 4 mg (4 mg Intravenous Given 04/06/15 0945)  iohexol (OMNIPAQUE) 300 MG/ML solution 50 mL (50 mLs Oral Contrast Given 04/06/15 0949)  iohexol (OMNIPAQUE) 300 MG/ML solution 100 mL (100 mLs Intravenous Contrast Given 04/06/15 1118)    Labs Review Labs Reviewed  CBC WITH DIFFERENTIAL/PLATELET - Abnormal; Notable for the following:    RBC 5.48 (*)    Hemoglobin 9.6 (*)    HCT 32.6 (*)    MCV 59.5 (*)    MCH 17.5 (*)    MCHC 29.4 (*)    RDW 29.4 (*)    Platelets 735 (*)    Neutrophils Relative % 86 (*)    Lymphocytes Relative 9 (*)    All other components within normal limits  COMPREHENSIVE METABOLIC PANEL - Abnormal; Notable for the following:    Potassium 3.2 (*)    Glucose, Bld 105 (*)    ALT 10 (*)    All other components within normal limits  LIPASE, BLOOD - Abnormal; Notable for the following:    Lipase  15 (*)    All other components within normal limits    Imaging Review Ct Chest W Contrast  04/06/2015   CLINICAL DATA:  Vomiting.  EXAM: CT CHEST, ABDOMEN, AND PELVIS WITH CONTRAST  TECHNIQUE: Multidetector CT imaging of the chest, abdomen and pelvis was performed following the standard protocol during bolus administration of intravenous contrast.  CONTRAST:  22mL OMNIPAQUE IOHEXOL 300 MG/ML SOLN, 166mL OMNIPAQUE IOHEXOL 300 MG/ML SOLN  COMPARISON:  None.  FINDINGS: CT CHEST FINDINGS  Mediastinum/Nodes: There is no mediastinal or hilar lymphadenopathy. There is no mediastinal hematoma. The heart size is normal. There is no pericardial effusion.  Lungs/Pleura: There is no pulmonary mass or nodule. No focal pneumonia or pleural  effusion is identified.  Musculoskeletal: No acute abnormality is identified within the visualized bones.  CT ABDOMEN PELVIS FINDINGS  Hepatobiliary: There is a 9.1 x 6.1 cm heterogeneously enhancing mass in the left lobe liver. There is a 0.9 x 1.6 cm mild enhancing mass in the dome of the liver. There is a 1.9 x 1.6 cm enhancing mass at the junction of caudate and right lobe liver. A 3 mm low density lesion is identified in the inferior left lobe liver. The gallbladder is normal.  Pancreas: Normal.  Spleen: Normal.  Adrenals/Urinary Tract: The adrenal glands are normal. They kidneys are normal. There is no nephrolithiasis or hydroureteronephrosis bilaterally.  Stomach/Bowel: There is a question 5.8 cm conglomerate of matted bowel loops in the mid lower abdomen. There is no small bowel obstruction. The colon is normal.  Vascular/Lymphatic: The aorta is normal. There is no aneurysmal dilatation of the aorta. There is no abdominal lymphadenopathy.  Reproductive: There is a 6 x 6.9 cm heterogeneously enhancing mass question arising from the uterus. A second mass, inseparable from the uterus that is lower density than adjacent uterine myometrium measures 5.7 x 7.8 cm.  Other: None.  Musculoskeletal: No focal discrete lytic or blastic lesion is identified within the visualized bones.  IMPRESSION: No acute abnormality identified within the chest. No evidence of metastatic disease.  Several enhancing masses in the liver consistent with liver metastasis.  Masses within the pelvis likely of the Uterine origin. Given the findings within the liver, malignant neoplasm is not excluded.  A conglomerate of matted bowel loops in the mid lower abdomen. There is no evidence of small bowel obstruction.   Electronically Signed   By: Abelardo Diesel M.D.   On: 04/06/2015 11:49   Ct Abdomen Pelvis W Contrast  04/06/2015   CLINICAL DATA:  Vomiting.  EXAM: CT CHEST, ABDOMEN, AND PELVIS WITH CONTRAST  TECHNIQUE: Multidetector CT imaging  of the chest, abdomen and pelvis was performed following the standard protocol during bolus administration of intravenous contrast.  CONTRAST:  53mL OMNIPAQUE IOHEXOL 300 MG/ML SOLN, 128mL OMNIPAQUE IOHEXOL 300 MG/ML SOLN  COMPARISON:  None.  FINDINGS: CT CHEST FINDINGS  Mediastinum/Nodes: There is no mediastinal or hilar lymphadenopathy. There is no mediastinal hematoma. The heart size is normal. There is no pericardial effusion.  Lungs/Pleura: There is no pulmonary mass or nodule. No focal pneumonia or pleural effusion is identified.  Musculoskeletal: No acute abnormality is identified within the visualized bones.  CT ABDOMEN PELVIS FINDINGS  Hepatobiliary: There is a 9.1 x 6.1 cm heterogeneously enhancing mass in the left lobe liver. There is a 0.9 x 1.6 cm mild enhancing mass in the dome of the liver. There is a 1.9 x 1.6 cm enhancing mass at the junction of caudate and right lobe liver.  A 3 mm low density lesion is identified in the inferior left lobe liver. The gallbladder is normal.  Pancreas: Normal.  Spleen: Normal.  Adrenals/Urinary Tract: The adrenal glands are normal. They kidneys are normal. There is no nephrolithiasis or hydroureteronephrosis bilaterally.  Stomach/Bowel: There is a question 5.8 cm conglomerate of matted bowel loops in the mid lower abdomen. There is no small bowel obstruction. The colon is normal.  Vascular/Lymphatic: The aorta is normal. There is no aneurysmal dilatation of the aorta. There is no abdominal lymphadenopathy.  Reproductive: There is a 6 x 6.9 cm heterogeneously enhancing mass question arising from the uterus. A second mass, inseparable from the uterus that is lower density than adjacent uterine myometrium measures 5.7 x 7.8 cm.  Other: None.  Musculoskeletal: No focal discrete lytic or blastic lesion is identified within the visualized bones.  IMPRESSION: No acute abnormality identified within the chest. No evidence of metastatic disease.  Several enhancing masses in the  liver consistent with liver metastasis.  Masses within the pelvis likely of the Uterine origin. Given the findings within the liver, malignant neoplasm is not excluded.  A conglomerate of matted bowel loops in the mid lower abdomen. There is no evidence of small bowel obstruction.   Electronically Signed   By: Abelardo Diesel M.D.   On: 04/06/2015 11:49   I have personally reviewed and evaluated these images and lab results as part of my medical decision-making.    MDM   Final diagnoses:  Intractable vomiting with nausea, vomiting of unspecified type  Liver metastases        Leonard Schwartz, MD 04/06/15 1228

## 2015-04-06 NOTE — Consult Note (Addendum)
Re:   Christina [name] DOB:   04/19/1960 MRN:   [id number]   Consultation - Dirk Dress  ASSESSMENT AND PLAN: 1.  Obstructing sigmoid cancer  Though there is not dilated bowel on CT scan - Dr. Collene Mares could not get her scope through the tumor.  Unclear how much of a bowel prep she had.  But will probably need a sigmoid colectomy up front to control the primary cancer.  Dr. Johney Maine is our surgeon of the week and I will review this with him.  2.  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  Will need oncology input for further treatment.  CEA pending.  3.  Probably large uterine fibroid with dilated uterus.  Will need gynecology input for management and plan.  4.  HTN x 20+ years 5.  Thalassemia, trait 6.  Anemia  Probably secondary to the tumor  Chief Complaint  Patient presents with  . Emesis   REFERRING PHYSICIAN:  Dr. Estill Cotta  HISTORY OF PRESENT ILLNESS: Christina [name] is a 54 y.o. (DOB: 04/19/1960)  AA  female whose primary care physician is Dr. Emilee Hero Bonsu.  She comes to the [facility] ER having vomiting last night into this AM and passing blood by rectum.  She had a colonoscopy on Friday, 04/04/2015, by Dr. Verdia Kuba who saw a obstructing colon cancer.  I do not have Dr. Lorie Apley colonoscopy report, but apparently she talked to Dr. Zella Richer about the patient.  She has not prior history of GI disease.  She has had no prior colonoscopy.  She did have a laparoscopy for endometriosis.  And she had a myomectomy for a uterine tumor in 1999 in [place].  She also said that she had a endometrial ablations around the same time.  She has seen Dr. Tharon Aquas, GYN, in [place] - but last saw him about 2 years ago.  She had a CT scan on 04/06/2015 - showing several enhancing lesions in the liver, possible uterine fibroids, and a 9 cm mass in the sigmoid colon.  The bowel is not obviously obstructed.    Past Medical History  Diagnosis Date  . Hypertension   . Endometriosis   .  Thalassanemia      History reviewed. No pertinent past surgical history.    Current Facility-Administered Medications  Medication Dose Route Frequency Provider Last Rate Last Dose  . potassium chloride 10 mEq in 100 mL IVPB  10 mEq Intravenous Q1 Hr x 3 Ripudeep K Rai, MD 100 mL/hr at 04/06/15 1305 10 mEq at 04/06/15 1305   Current Outpatient Prescriptions  Medication Sig Dispense Refill  . NIFEdipine (PROCARDIA-XL/ADALAT CC) 30 MG 24 hr tablet Take 30 mg by mouth daily.        Allergies  Allergen Reactions  . Milk-Related Compounds Diarrhea  . Nsaids Other (See Comments)    Heavy menstrual bleeding     REVIEW OF SYSTEMS: Skin:  No history of rash.  No history of abnormal moles. Infection:  No history of hepatitis or HIV.  No history of MRSA. Neurologic:  No history of stroke.  No history of seizure.  No history of headaches. Cardiac:  HTN x 20 years.  Has not had any cardiac event. Pulmonary:  Does not smoke cigarettes.  No asthma or bronchitis.  No OSA/CPAP.  Endocrine:  No diabetes. No thyroid disease. Gastrointestinal:  No history of stomach disease.  No history of liver disease.  No history of gall bladder disease.  No history of  pancreas disease.  No history of colon disease. Urologic:  No history of kidney stones.  No history of bladder infections. Musculoskeletal:  No history of joint or back disease. GYN:  No children.  She did have a laparoscopy for endometriosis.  And she had a myomectomy for a uterine tumor in 1999 in [place].  She also said that she had a endometrial ablations around the same time.  She has seen Dr. Tharon Aquas, GYN, in [place] - but last saw him about 2 years ago.  Her last mammogram was at 54 years of age. Hematologic:  Thalassemia, trait Psycho-social:  The patient is oriented.   The patient has no obvious psychologic or social impairment to understanding our conversation and plan.  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 Her husband is in the room with her. His name is Iona Beard.  They were married in 2014.   She was born in [place], but grew up in [place].  Her husband is from [place].  PHYSICAL EXAM: BP 147/75 mmHg  Pulse 80  Temp(Src) 98.2 F (36.8 C) (Oral)  Resp 18  Ht 5\' 6"  (1.676 m)  Wt 72.576 kg (160 lb)  BMI 25.84 kg/m2  SpO2 100%  General: WN AA F who is alert and generally healthy appearing.  HEENT: Normal. Pupils equal. Neck: Supple. No mass.  No thyroid mass. Lymph Nodes:  No supraclavicular or cervical nodes. Lungs: Clear to auscultation and symmetric breath sounds. Heart:  RRR. No murmur or rub. Breasts:  Right - no mass  Left - no Mass  Abdomen: Soft. Mass effect in the LLQ with tenderness in this area. Rectal: Not done. Extremities:  Good strength and ROM  in upper and lower extremities. Neurologic:  Grossly intact to motor and sensory function. Psychiatric: Has normal mood and affect. Behavior is normal.   DATA REVIEWED: Epic notes  Alphonsa Overall, MD,  [facility] [address] [place].,  [place], [place]    [place] Phone:  [place]:  [phone]

## 2015-04-07 ENCOUNTER — Ambulatory Visit (HOSPITAL_COMMUNITY): Payer: Self-pay

## 2015-04-07 ENCOUNTER — Encounter (HOSPITAL_COMMUNITY): Payer: Self-pay | Admitting: Surgery

## 2015-04-07 DIAGNOSIS — D569 Thalassemia, unspecified: Secondary | ICD-10-CM | POA: Diagnosis present

## 2015-04-07 DIAGNOSIS — I10 Essential (primary) hypertension: Secondary | ICD-10-CM

## 2015-04-07 DIAGNOSIS — R1900 Intra-abdominal and pelvic swelling, mass and lump, unspecified site: Secondary | ICD-10-CM

## 2015-04-07 LAB — BASIC METABOLIC PANEL
Anion gap: 8 (ref 5–15)
CHLORIDE: 107 mmol/L (ref 101–111)
CO2: 28 mmol/L (ref 22–32)
Calcium: 9.1 mg/dL (ref 8.9–10.3)
Creatinine, Ser: 0.8 mg/dL (ref 0.44–1.00)
GFR calc non Af Amer: >60 mL/min (ref >60)
Glucose, Bld: 91 mg/dL (ref 65–99)
POTASSIUM: 3.3 mmol/L — AB (ref 3.5–5.1)
SODIUM: 143 mmol/L (ref 135–145)

## 2015-04-07 LAB — CBC
HEMATOCRIT: 30.9 % — AB (ref 36.0–46.0)
Hemoglobin: 8.9 g/dL — ABNORMAL LOW (ref 12.0–15.0)
MCH: 17.3 pg — ABNORMAL LOW (ref 26.0–34.0)
MCHC: 28.8 g/dL — ABNORMAL LOW (ref 30.0–36.0)
MCV: 60.2 fL — AB (ref 78.0–100.0)
Platelets: 597 10*3/uL — ABNORMAL HIGH (ref 150–400)
RBC: 5.13 MIL/uL — AB (ref 3.87–5.11)
RDW: 29.4 % — ABNORMAL HIGH (ref 11.5–15.5)
WBC: 7.2 10*3/uL (ref 4.0–10.5)

## 2015-04-07 LAB — CEA: CEA: 467.3 ng/mL — ABNORMAL HIGH (ref 0.0–4.7)

## 2015-04-07 LAB — CA 125: CA 125: 11.9 U/mL (ref 0.0–38.1)

## 2015-04-07 LAB — PROTIME-INR
INR: 1.17 (ref 0.00–1.49)
Prothrombin Time: 15.1 seconds (ref 11.6–15.2)

## 2015-04-07 LAB — CANCER ANTIGEN 19-9: CA 19-9: 1605 U/mL — ABNORMAL HIGH (ref 0–35)

## 2015-04-07 LAB — APTT: APTT: 32 s (ref 24–37)

## 2015-04-07 MED ORDER — CHLORHEXIDINE GLUCONATE 4 % EX LIQD
60.0000 mL | Freq: Once | CUTANEOUS | Status: DC
  Filled 2015-04-07: qty 60

## 2015-04-07 MED ORDER — NEOMYCIN SULFATE 500 MG PO TABS
1000.0000 mg | ORAL_TABLET | ORAL | Status: AC
  Administered 2015-04-07 (×3): 1000 mg via ORAL
  Filled 2015-04-07 (×3): qty 2

## 2015-04-07 MED ORDER — SODIUM CHLORIDE 0.9 % IV SOLN
INTRAVENOUS | Status: DC
  Administered 2015-04-08 (×2): via INTRAVENOUS
  Administered 2015-04-09: 1000 mL via INTRAVENOUS

## 2015-04-07 MED ORDER — LACTATED RINGERS IV BOLUS (SEPSIS): 1000.0000 mL | Freq: Three times a day (TID) | INTRAVENOUS | Status: AC | PRN

## 2015-04-07 MED ORDER — DEXTROSE 5 % IV SOLN
2.0000 g | INTRAVENOUS | Status: AC
  Administered 2015-04-08: 2 g via INTRAVENOUS
  Filled 2015-04-07: qty 2

## 2015-04-07 MED ORDER — LIP MEDEX EX OINT
1.0000 "application " | TOPICAL_OINTMENT | Freq: Two times a day (BID) | CUTANEOUS | Status: DC
  Administered 2015-04-07 – 2015-04-11 (×6): 1 via TOPICAL
  Filled 2015-04-07 (×2): qty 7

## 2015-04-07 MED ORDER — ALUM & MAG HYDROXIDE-SIMETH 200-200-20 MG/5ML PO SUSP: 30.0000 mL | Freq: Four times a day (QID) | ORAL | Status: DC | PRN

## 2015-04-07 MED ORDER — POLYETHYLENE GLYCOL 3350 17 G PO PACK
34.0000 g | PACK | Freq: Two times a day (BID) | ORAL | Status: DC
  Administered 2015-04-07 – 2015-04-08 (×2): 34 g via ORAL
  Filled 2015-04-07 (×4): qty 2

## 2015-04-07 MED ORDER — HEPARIN SODIUM (PORCINE) 5000 UNIT/ML IJ SOLN
5000.0000 [IU] | Freq: Once | INTRAMUSCULAR | Status: AC
  Administered 2015-04-08: 5000 [IU] via SUBCUTANEOUS

## 2015-04-07 MED ORDER — METRONIDAZOLE 500 MG PO TABS
1000.0000 mg | ORAL_TABLET | ORAL | Status: AC
  Administered 2015-04-07 (×3): 1000 mg via ORAL
  Filled 2015-04-07 (×3): qty 2

## 2015-04-07 MED ORDER — BUPIVACAINE LIPOSOME 1.3 % IJ SUSP
20.0000 mL | INTRAMUSCULAR | Status: AC
  Filled 2015-04-07 (×2): qty 20

## 2015-04-07 MED ORDER — SODIUM CHLORIDE 0.9 % IV SOLN
510.0000 mg | Freq: Once | INTRAVENOUS | Status: AC
  Administered 2015-04-07: 510 mg via INTRAVENOUS
  Filled 2015-04-07: qty 17

## 2015-04-07 MED ORDER — ALVIMOPAN 12 MG PO CAPS
12.0000 mg | ORAL_CAPSULE | Freq: Once | ORAL | Status: AC
  Administered 2015-04-08: 12 mg via ORAL
  Filled 2015-04-07: qty 1

## 2015-04-07 MED ORDER — CHLORHEXIDINE GLUCONATE 4 % EX LIQD
60.0000 mL | Freq: Once | CUTANEOUS | Status: AC
  Administered 2015-04-07: 4 via TOPICAL
  Filled 2015-04-07: qty 60

## 2015-04-07 MED ORDER — SODIUM CHLORIDE 0.9 % IV SOLN
INTRAVENOUS | Status: DC
  Filled 2015-04-07 (×2): qty 6

## 2015-04-07 MED ORDER — POTASSIUM CHLORIDE CRYS ER 20 MEQ PO TBCR
40.0000 meq | EXTENDED_RELEASE_TABLET | Freq: Once | ORAL | Status: AC
  Administered 2015-04-07: 40 meq via ORAL
  Filled 2015-04-07: qty 2

## 2015-04-07 NOTE — Progress Notes (Signed)
Christina [name]   DOB:04/19/1960   BC#:[id number]   HWT#:[id number]  Subjective: she had mild nausea this morning, otherwise doing well. Was seen by Dr. Johney Maine today    Objective:  Filed Vitals:   04/07/15 1350  BP: 157/79  Pulse: 77  Temp: 98 F (36.7 C)  Resp: 18    Body mass index is 25.84 kg/(m^2).  Intake/Output Summary (Last 24 hours) at 04/07/15 1818 Last data filed at 04/07/15 0551  Gross per 24 hour  Intake 1398.75 ml  Output      0 ml  Net 1398.75 ml     Sclerae unicteric  Oropharynx clear  No peripheral adenopathy  Lungs clear -- no rales or rhonchi  Heart regular rate and rhythm  Abdomen benign  MSK no focal spinal tenderness, no peripheral edema  Neuro nonfocal   CBG (last 3)  No results for input(s): GLUCAP in the last 72 hours.   Labs:  Lab Results  Component Value Date   WBC 7.2 04/07/2015   HGB 8.9* 04/07/2015   HCT 30.9* 04/07/2015   MCV 60.2* 04/07/2015   PLT 597* 04/07/2015   NEUTROABS 6.6 04/06/2015    _0 @  Urine Studies No results for input(s): UHGB, CRYS in the last 72 hours.  Invalid input(s): UACOL, UAPR, USPG, UPH, UTP, UGL, UKET, UBIL, UNIT, UROB, ULEU, UEPI, UWBC, URBC, UBAC, CAST, UCOM, BILUA  Basic Metabolic Panel:  Recent Labs Lab 04/06/15 0945 04/07/15 0537  NA 139 143  K 3.2* 3.3*  CL 104 107  CO2 24 28  GLUCOSE 105* 91  BUN 10 <5*  CREATININE 0.90 0.80  CALCIUM 9.5 9.1   GFR Estimated Creatinine Clearance: 81 mL/min (by C-G formula based on Cr of 0.8). Liver Function Tests:  Recent Labs Lab 04/06/15 0945  AST 22  ALT 10*  ALKPHOS 63  BILITOT 0.8  PROT 7.7  ALBUMIN 3.9    Recent Labs Lab 04/06/15 0945  LIPASE 15*    Recent Labs Lab 04/06/15 1432  AMMONIA 17   Coagulation profile  Recent Labs Lab 04/07/15 1100  INR 1.17    CBC:  Recent Labs Lab 04/06/15 0945 04/07/15 0537  WBC 7.7 7.2  NEUTROABS 6.6  --   HGB 9.6* 8.9*  HCT 32.6* 30.9*  MCV 59.5* 60.2*  PLT 735*  597*   Cardiac Enzymes: No results for input(s): CKTOTAL, CKMB, CKMBINDEX, TROPONINI in the last 168 hours. BNP: Invalid input(s): POCBNP CBG: No results for input(s): GLUCAP in the last 168 hours. D-Dimer No results for input(s): DDIMER in the last 72 hours. Hgb A1c No results for input(s): HGBA1C in the last 72 hours. Lipid Profile No results for input(s): CHOL, HDL, LDLCALC, TRIG, CHOLHDL, LDLDIRECT in the last 72 hours. Thyroid function studies No results for input(s): TSH, T4TOTAL, T3FREE, THYROIDAB in the last 72 hours.  Invalid input(s): FREET3 Anemia work up  Recent Labs  04/06/15 1432  VITAMINB12 636  FOLATE 32.7  FERRITIN 8*  TIBC 406  IRON 17*  RETICCTPCT RESULTS UNAVAILABLE DUE TO INTERFERING SUBSTANCE   Microbiology No results found for this or any previous visit (from the past 240 hour(s)).  CEA  Status: Finalresult Visible to patient:  Not Released Nextappt: None            Ref Range 1d ago    CEA 0.0 - 4.7 ng/mL 467.3 (H)   Comments: (NOTE)         Cancer antigen 19-9  Status: Finalresult Visible to patient:  Not  Released Nextappt: None            Ref Range 1d ago    CA 19-9 0 - 35 U/mL 1605 (H)          Studies:  Ct Chest W Contrast  04/06/2015   ADDENDUM REPORT: 04/06/2015 13:18  ADDENDUM: Upon further evaluation, The masslike lesion in the pelvis previously question matted small bowel loops represent 9 cm sigmoid colon cancer.   Electronically Signed   By: Abelardo Diesel M.D.   On: 04/06/2015 13:18   04/06/2015   CLINICAL DATA:  Vomiting.  EXAM: CT CHEST, ABDOMEN, AND PELVIS WITH CONTRAST  TECHNIQUE: Multidetector CT imaging of the chest, abdomen and pelvis was performed following the standard protocol during bolus administration of intravenous contrast.  CONTRAST:  49m OMNIPAQUE IOHEXOL 300 MG/ML SOLN, 1024mOMNIPAQUE IOHEXOL 300 MG/ML SOLN  COMPARISON:  None.  FINDINGS: CT CHEST FINDINGS   Mediastinum/Nodes: There is no mediastinal or hilar lymphadenopathy. There is no mediastinal hematoma. The heart size is normal. There is no pericardial effusion.  Lungs/Pleura: There is no pulmonary mass or nodule. No focal pneumonia or pleural effusion is identified.  Musculoskeletal: No acute abnormality is identified within the visualized bones.  CT ABDOMEN PELVIS FINDINGS  Hepatobiliary: There is a 9.1 x 6.1 cm heterogeneously enhancing mass in the left lobe liver. There is a 0.9 x 1.6 cm mild enhancing mass in the dome of the liver. There is a 1.9 x 1.6 cm enhancing mass at the junction of caudate and right lobe liver. A 3 mm low density lesion is identified in the inferior left lobe liver. The gallbladder is normal.  Pancreas: Normal.  Spleen: Normal.  Adrenals/Urinary Tract: The adrenal glands are normal. They kidneys are normal. There is no nephrolithiasis or hydroureteronephrosis bilaterally.  Stomach/Bowel: There is a question 5.8 cm conglomerate of matted bowel loops in the mid lower abdomen. There is no small bowel obstruction. The colon is normal.  Vascular/Lymphatic: The aorta is normal. There is no aneurysmal dilatation of the aorta. There is no abdominal lymphadenopathy.  Reproductive: There is a 6 x 6.9 cm heterogeneously enhancing mass question arising from the uterus. A second mass, inseparable from the uterus that is lower density than adjacent uterine myometrium measures 5.7 x 7.8 cm.  Other: None.  Musculoskeletal: No focal discrete lytic or blastic lesion is identified within the visualized bones.  IMPRESSION: No acute abnormality identified within the chest. No evidence of metastatic disease.  Several enhancing masses in the liver consistent with liver metastasis.  Masses within the pelvis likely of the Uterine origin. Given the findings within the liver, malignant neoplasm is not excluded.  A conglomerate of matted bowel loops in the mid lower abdomen. There is no evidence of small bowel  obstruction.  Electronically Signed: By: WeAbelardo Diesel.D. On: 04/06/2015 11:49   UsKorearansvaginal Non-ob  04/06/2015   CLINICAL DATA:  Followup CT to evaluate possible uterine masses. History of uterine fibroids, endometriosis.  EXAM: TRANSABDOMINAL AND TRANSVAGINAL ULTRASOUND OF PELVIS  TECHNIQUE: Both transabdominal and transvaginal ultrasound examinations of the pelvis were performed. Transabdominal technique was performed for global imaging of the pelvis including uterus, ovaries, adnexal regions, and pelvic cul-de-sac. It was necessary to proceed with endovaginal exam following the transabdominal exam to visualize the endometrium and ovaries.  COMPARISON:  CT today.  FINDINGS: Uterus  Measurements: 6.3 x 6.3 x 11.2 cm. There is at least 1 fibroid along the right side of the uterine body measuring  2.2 x 2.4 x 2.8 cm. Large mass seen on CT abutting the right-sided the lower uterus is not well demonstrated by ultrasound transabdominally nor endovaginally due to moderate adnexal bowel gas.  Endometrium  Thickness: 4 mm.  No focal abnormality visualized.  Right ovary  Measurements: Not visualized.  Left ovary  Measurements: Not visualized.  Other findings  No free fluid.  IMPRESSION: Mildly enlarged uterus with at least 1 fibroid over the right side of the uterine body measuring 2.8 cm. The large 7 cm mass seen on CT which appears to be arising from the right side of the lower uterine segment is not well demonstrated sonographically.  Nonvisualization of the ovaries.  No free fluid.   Electronically Signed   By: Marin Olp M.D.   On: 04/06/2015 21:05   US Pelvis Complete  04/06/2015   CLINICAL DATA:  Followup CT to evaluate possible uterine masses. History of uterine fibroids, endometriosis.  EXAM: TRANSABDOMINAL AND TRANSVAGINAL ULTRASOUND OF PELVIS  TECHNIQUE: Both transabdominal and transvaginal ultrasound examinations of the pelvis were performed. Transabdominal technique was performed for global  imaging of the pelvis including uterus, ovaries, adnexal regions, and pelvic cul-de-sac. It was necessary to proceed with endovaginal exam following the transabdominal exam to visualize the endometrium and ovaries.  COMPARISON:  CT today.  FINDINGS: Uterus  Measurements: 6.3 x 6.3 x 11.2 cm. There is at least 1 fibroid along the right side of the uterine body measuring 2.2 x 2.4 x 2.8 cm. Large mass seen on CT abutting the right-sided the lower uterus is not well demonstrated by ultrasound transabdominally nor endovaginally due to moderate adnexal bowel gas.  Endometrium  Thickness: 4 mm.  No focal abnormality visualized.  Right ovary  Measurements: Not visualized.  Left ovary  Measurements: Not visualized.  Other findings  No free fluid.  IMPRESSION: Mildly enlarged uterus with at least 1 fibroid over the right side of the uterine body measuring 2.8 cm. The large 7 cm mass seen on CT which appears to be arising from the right side of the lower uterine segment is not well demonstrated sonographically.  Nonvisualization of the ovaries.  No free fluid.   Electronically Signed   By: Marin Olp M.D.   On: 04/06/2015 21:05   Ct Abdomen Pelvis W Contrast  04/06/2015   ADDENDUM REPORT: 04/06/2015 13:18  ADDENDUM: Upon further evaluation, The masslike lesion in the pelvis previously question matted small bowel loops represent 9 cm sigmoid colon cancer.   Electronically Signed   By: Abelardo Diesel M.D.   On: 04/06/2015 13:18   04/06/2015   CLINICAL DATA:  Vomiting.  EXAM: CT CHEST, ABDOMEN, AND PELVIS WITH CONTRAST  TECHNIQUE: Multidetector CT imaging of the chest, abdomen and pelvis was performed following the standard protocol during bolus administration of intravenous contrast.  CONTRAST:  70m OMNIPAQUE IOHEXOL 300 MG/ML SOLN, 1078mOMNIPAQUE IOHEXOL 300 MG/ML SOLN  COMPARISON:  None.  FINDINGS: CT CHEST FINDINGS  Mediastinum/Nodes: There is no mediastinal or hilar lymphadenopathy. There is no mediastinal hematoma.  The heart size is normal. There is no pericardial effusion.  Lungs/Pleura: There is no pulmonary mass or nodule. No focal pneumonia or pleural effusion is identified.  Musculoskeletal: No acute abnormality is identified within the visualized bones.  CT ABDOMEN PELVIS FINDINGS  Hepatobiliary: There is a 9.1 x 6.1 cm heterogeneously enhancing mass in the left lobe liver. There is a 0.9 x 1.6 cm mild enhancing mass in the dome of the liver. There is a  1.9 x 1.6 cm enhancing mass at the junction of caudate and right lobe liver. A 3 mm low density lesion is identified in the inferior left lobe liver. The gallbladder is normal.  Pancreas: Normal.  Spleen: Normal.  Adrenals/Urinary Tract: The adrenal glands are normal. They kidneys are normal. There is no nephrolithiasis or hydroureteronephrosis bilaterally.  Stomach/Bowel: There is a question 5.8 cm conglomerate of matted bowel loops in the mid lower abdomen. There is no small bowel obstruction. The colon is normal.  Vascular/Lymphatic: The aorta is normal. There is no aneurysmal dilatation of the aorta. There is no abdominal lymphadenopathy.  Reproductive: There is a 6 x 6.9 cm heterogeneously enhancing mass question arising from the uterus. A second mass, inseparable from the uterus that is lower density than adjacent uterine myometrium measures 5.7 x 7.8 cm.  Other: None.  Musculoskeletal: No focal discrete lytic or blastic lesion is identified within the visualized bones.  IMPRESSION: No acute abnormality identified within the chest. No evidence of metastatic disease.  Several enhancing masses in the liver consistent with liver metastasis.  Masses within the pelvis likely of the Uterine origin. Given the findings within the liver, malignant neoplasm is not excluded.  A conglomerate of matted bowel loops in the mid lower abdomen. There is no evidence of small bowel obstruction.  Electronically Signed: By: Abelardo Diesel M.D. On: 04/06/2015 11:49    Assessment:  54 year old African-American female, with past medical history of hypertension, thalassemia trait, iron deficient anemia, no history of uterine fibroids, endometriosis, who presents with intermittent rectal bleeding, mild nausea, and mild intermittent left lower quadrant abdominal pain.  1. 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 Collene Mares today, who does not think her colon mass can be stented -I spoke with colorectal surgeon Dr. Johney Maine today. Giving the sigmoid colon mass causing obstruction, I agree with the plan to proceed with primary sigmoid colon tumor resection tomorrow morning -Dr. gross will also biopsy the left lobe liver lesion, and place a port for her tomorrow -I will plan to start chemotherapy when she recovers from her surgery, hopefully in 3-4 weeks. -I'll request MSI and the rest mutation test on her liver biopsy tissue  2. Iron deficient anemia -She has received IV Feraheme 1 dose today, we'll repeat next week   I will follow up.   Truitt Merle, MD 04/07/2015  6:18 PM

## 2015-04-07 NOTE — Progress Notes (Signed)
[facility]                                                                              Patient Demographics  Christina [name], is a 54 y.o. female, DOB - 04/19/1960, BZJ:[id number]  Admit date - 04/06/2015   Admitting Physician Clerance Umland Krystal Eaton, MD  Outpatient Primary MD for the patient is [name],[name], MD  LOS - 1   Chief Complaint  Patient presents with  . Emesis       Brief HPI    Patient is a 54 year old female with hypertension, endometriosis, chronic anemia presented to ED with complaints of intractable nausea and vomiting that started last night. History was obtained from the patient and her husband in the room. Patient reported that she has been having rectal bleeding off and on for last 3 months. Patient was seen by gastroenterology, Dr. Collene Mares, underwent colonoscopy on 8/26 and Dr. Collene Mares was unable to pass the scope through the large nearly obstructing mass. Dr. Collene Mares had contacted surgery, biopsies were obtained of the mass, patient was scheduled to have CT scan of the abdomen on 8/29. However overnight patient started having several episodes of nausea and vomiting and presented to [facility] after the recommendations from Dr. Carlean Purl for further workup. At the time of my examination, patient complains of left lower quadrant abdominal pain, 6/10, not radiating, associated with nausea. Currently no vomiting, fevers or chills. Patient reports that she had loss of appetite in the last 1 week and may have lost about 5 pounds. She has a history of colon cancer in one of her cousins, otherwise not in her immediate family and siblings. ED workup showed sodium of 139, potassium 3.2, creatinine 0.9 every BC 7.7 hemoglobin 9.6, platelets 735. Per Dr. Collene Mares, patient had received 2 units of packed RBC transfusion last week in [place] for the hemoglobin of 6.2, confirmed from the patient. CT of the abdomen/pelvis showed several enhancing masses in the liver consistent  with liver metastases, masses in the pelvis likely of uterine origin, 9 cm sigmoid colon mass.   Assessment & Plan   Principal Problem:  Nausea & vomiting with colon mass newly diagnosed with liver metastasis - Continue clear liquid diet for now, highly appreciate surgical service and oncology for their recommendations. - patient had the flexible sigmoidoscopy on 8/26 and had large nearly obstructing mass ~10cm in the rectosigmoid colon. Colon biopsies are still pending - CEA elevated at 467,CA-19-9 also elevated at 1605, CA-125 normal -Oncology following. Per surgery, planned colectomy and liver biopsy tomorrow 8/30, might need colostomy.  Active Problems:  Pelvic mass likely due to uterine fibroid  - Pelvic ultrasound showed mildly enlarged uterus with at least one fibroid and large 7 cm mass seen on CT is not well demonstrated sonographically   Hypokalemia likely due to nausea and vomiting Replaced   Iron deficiency anemia - Ferritin 8, will give 1 dose of IV Feraheme  essential hypertension Continue nifedipine  Code Status: Full code  Family Communication: Discussed in detail with the patient, all imaging results, lab results explained to the patient and her husband at the bedside   Disposition  Plan: Not medically ready  time Spent in minutes 25 minutes  Procedures  CT abdomen and pelvis Pelvic ultrasound and transvaginal ultrasound  Consults  Interventional radiology   Gen. Surgery Oncology  DVT Prophylaxis  heparin subcutaneous  Medications  Scheduled Meds: . [START ON 04/08/2015] alvimopan  12 mg Oral Once  . [START ON 04/08/2015] bupivacaine liposome  20 mL Infiltration On Call to OR  . [START ON 04/08/2015] cefoTEtan (CEFOTAN) 2 GM IVPB  2 g Intravenous On Call to OR  . chlorhexidine  60 mL Topical Once   And  . [START ON 04/08/2015] chlorhexidine  60 mL Topical Once  . [START ON 04/08/2015] clindamycin / gentamicin INTRAPERITONEAL Lavage irrigation    Intraperitoneal To OR  . [START ON 04/08/2015] heparin  5,000 Units Subcutaneous Once  . lip balm  1 application Topical BID  . metroNIDAZOLE  1,000 mg Oral 3 times per day on Mon  . neomycin  1,000 mg Oral 3 times per day on Mon  . NIFEdipine  30 mg Oral Daily  . polyethylene glycol  34 g Oral BID  . sodium chloride  3 mL Intravenous Q12H   Continuous Infusions: . sodium chloride     PRN Meds:.acetaminophen **OR** acetaminophen, alum & mag hydroxide-simeth, HYDROcodone-acetaminophen, HYDROmorphone (DILAUDID) injection, lactated ringers, ondansetron **OR** ondansetron (ZOFRAN) IV   Antibiotics   Anti-infectives    Start     Dose/Rate Route Frequency Ordered Stop   04/08/15 0800  clindamycin (CLEOCIN) 900 mg, gentamicin (GARAMYCIN) 240 mg in sodium chloride 0.9 % 1,000 mL for intraperitoneal lavage    Comments:  Pharmacy may adjust dosing strength, schedule, rate of infusion, etc as needed to optimize therapy    Intraperitoneal To Surgery 04/07/15 1128 04/09/15 0800   04/08/15 0600  cefoTEtan (CEFOTAN) 2 g in dextrose 5 % 50 mL IVPB     2 g 100 mL/hr over 30 Minutes Intravenous On call to O.R. 04/07/15 1128 04/09/15 0559   04/07/15 1300  neomycin (MYCIFRADIN) tablet 1,000 mg     1,000 mg Oral 3 times per day on Mon 04/07/15 1128 04/14/15 1259   04/07/15 1300  metroNIDAZOLE (FLAGYL) tablet 1,000 mg    Comments:  Take 2 pills (=1000mg ) by mouth at 1pm, 3pm, and 10pm the day before your colorectal operation   1,000 mg Oral 3 times per day on Mon 04/07/15 1128 04/14/15 1259        Subjective:   [name] was seen and examined today.  overnight remained stable however this morning had nausea but no vomiting. Left lower quadrant abdominal discomfort still persists. Patient denies dizziness, chest pain, shortness of breath,  new weakness, numbess, tingling. No acute events overnight.    Objective:   Blood pressure 121/73, pulse 75, temperature 98.1 F (36.7 C), temperature  source Oral, resp. rate 19, height 5\' 6"  (1.676 m), weight 72.576 kg (160 lb), SpO2 100 %.  Wt Readings from Last 3 Encounters:  04/06/15 72.576 kg (160 lb)     Intake/Output Summary (Last 24 hours) at 04/07/15 1222 Last data filed at 04/07/15 0551  Gross per 24 hour  Intake 1398.75 ml  Output      0 ml  Net 1398.75 ml    Exam  General: Alert and oriented x 3, NAD  HEENT:  PERRLA, EOMI, Anicteric Sclera, mucous membranes moist.   Neck: Supple, no JVD, no masses  CVS: S1 S2 auscultated, no rubs, murmurs or gallops. Regular rate and rhythm.  Respiratory: Clear to  auscultation bilaterally, no wheezing, rales or rhonchi  Abdomen: Soft, mildly tender in the left lower quadrant , nondistended, + bowel sounds  Ext: no cyanosis clubbing or edema  Neuro: AAOx3, Cr N's II- XII. Strength 5/5 upper and lower extremities bilaterally  Skin: No rashes  Psych: Normal affect and demeanor, alert and oriented x3    Data Review   Micro Results No results found for this or any previous visit (from the past 240 hour(s)).  Radiology Reports Ct Chest W Contrast  04/06/2015   ADDENDUM REPORT: 04/06/2015 13:18  ADDENDUM: Upon further evaluation, The masslike lesion in the pelvis previously question matted small bowel loops represent 9 cm sigmoid colon cancer.   Electronically Signed   By: Abelardo Diesel M.D.   On: 04/06/2015 13:18   04/06/2015   CLINICAL DATA:  Vomiting.  EXAM: CT CHEST, ABDOMEN, AND PELVIS WITH CONTRAST  TECHNIQUE: Multidetector CT imaging of the chest, abdomen and pelvis was performed following the standard protocol during bolus administration of intravenous contrast.  CONTRAST:  50mL OMNIPAQUE IOHEXOL 300 MG/ML SOLN, 162mL OMNIPAQUE IOHEXOL 300 MG/ML SOLN  COMPARISON:  None.  FINDINGS: CT CHEST FINDINGS  Mediastinum/Nodes: There is no mediastinal or hilar lymphadenopathy. There is no mediastinal hematoma. The heart size is normal. There is no pericardial effusion.   Lungs/Pleura: There is no pulmonary mass or nodule. No focal pneumonia or pleural effusion is identified.  Musculoskeletal: No acute abnormality is identified within the visualized bones.  CT ABDOMEN PELVIS FINDINGS  Hepatobiliary: There is a 9.1 x 6.1 cm heterogeneously enhancing mass in the left lobe liver. There is a 0.9 x 1.6 cm mild enhancing mass in the dome of the liver. There is a 1.9 x 1.6 cm enhancing mass at the junction of caudate and right lobe liver. A 3 mm low density lesion is identified in the inferior left lobe liver. The gallbladder is normal.  Pancreas: Normal.  Spleen: Normal.  Adrenals/Urinary Tract: The adrenal glands are normal. They kidneys are normal. There is no nephrolithiasis or hydroureteronephrosis bilaterally.  Stomach/Bowel: There is a question 5.8 cm conglomerate of matted bowel loops in the mid lower abdomen. There is no small bowel obstruction. The colon is normal.  Vascular/Lymphatic: The aorta is normal. There is no aneurysmal dilatation of the aorta. There is no abdominal lymphadenopathy.  Reproductive: There is a 6 x 6.9 cm heterogeneously enhancing mass question arising from the uterus. A second mass, inseparable from the uterus that is lower density than adjacent uterine myometrium measures 5.7 x 7.8 cm.  Other: None.  Musculoskeletal: No focal discrete lytic or blastic lesion is identified within the visualized bones.  IMPRESSION: No acute abnormality identified within the chest. No evidence of metastatic disease.  Several enhancing masses in the liver consistent with liver metastasis.  Masses within the pelvis likely of the Uterine origin. Given the findings within the liver, malignant neoplasm is not excluded.  A conglomerate of matted bowel loops in the mid lower abdomen. There is no evidence of small bowel obstruction.  Electronically Signed: By: Abelardo Diesel M.D. On: 04/06/2015 11:49   US Transvaginal Non-ob  04/06/2015   CLINICAL DATA:  Followup CT to evaluate  possible uterine masses. History of uterine fibroids, endometriosis.  EXAM: TRANSABDOMINAL AND TRANSVAGINAL ULTRASOUND OF PELVIS  TECHNIQUE: Both transabdominal and transvaginal ultrasound examinations of the pelvis were performed. Transabdominal technique was performed for global imaging of the pelvis including uterus, ovaries, adnexal regions, and pelvic cul-de-sac. It was necessary to proceed with  endovaginal exam following the transabdominal exam to visualize the endometrium and ovaries.  COMPARISON:  CT today.  FINDINGS: Uterus  Measurements: 6.3 x 6.3 x 11.2 cm. There is at least 1 fibroid along the right side of the uterine body measuring 2.2 x 2.4 x 2.8 cm. Large mass seen on CT abutting the right-sided the lower uterus is not well demonstrated by ultrasound transabdominally nor endovaginally due to moderate adnexal bowel gas.  Endometrium  Thickness: 4 mm.  No focal abnormality visualized.  Right ovary  Measurements: Not visualized.  Left ovary  Measurements: Not visualized.  Other findings  No free fluid.  IMPRESSION: Mildly enlarged uterus with at least 1 fibroid over the right side of the uterine body measuring 2.8 cm. The large 7 cm mass seen on CT which appears to be arising from the right side of the lower uterine segment is not well demonstrated sonographically.  Nonvisualization of the ovaries.  No free fluid.   Electronically Signed   By: Marin Olp M.D.   On: 04/06/2015 21:05   US Pelvis Complete  04/06/2015   CLINICAL DATA:  Followup CT to evaluate possible uterine masses. History of uterine fibroids, endometriosis.  EXAM: TRANSABDOMINAL AND TRANSVAGINAL ULTRASOUND OF PELVIS  TECHNIQUE: Both transabdominal and transvaginal ultrasound examinations of the pelvis were performed. Transabdominal technique was performed for global imaging of the pelvis including uterus, ovaries, adnexal regions, and pelvic cul-de-sac. It was necessary to proceed with endovaginal exam following the transabdominal  exam to visualize the endometrium and ovaries.  COMPARISON:  CT today.  FINDINGS: Uterus  Measurements: 6.3 x 6.3 x 11.2 cm. There is at least 1 fibroid along the right side of the uterine body measuring 2.2 x 2.4 x 2.8 cm. Large mass seen on CT abutting the right-sided the lower uterus is not well demonstrated by ultrasound transabdominally nor endovaginally due to moderate adnexal bowel gas.  Endometrium  Thickness: 4 mm.  No focal abnormality visualized.  Right ovary  Measurements: Not visualized.  Left ovary  Measurements: Not visualized.  Other findings  No free fluid.  IMPRESSION: Mildly enlarged uterus with at least 1 fibroid over the right side of the uterine body measuring 2.8 cm. The large 7 cm mass seen on CT which appears to be arising from the right side of the lower uterine segment is not well demonstrated sonographically.  Nonvisualization of the ovaries.  No free fluid.   Electronically Signed   By: Marin Olp M.D.   On: 04/06/2015 21:05   Ct Abdomen Pelvis W Contrast  04/06/2015   ADDENDUM REPORT: 04/06/2015 13:18  ADDENDUM: Upon further evaluation, The masslike lesion in the pelvis previously question matted small bowel loops represent 9 cm sigmoid colon cancer.   Electronically Signed   By: Abelardo Diesel M.D.   On: 04/06/2015 13:18   04/06/2015   CLINICAL DATA:  Vomiting.  EXAM: CT CHEST, ABDOMEN, AND PELVIS WITH CONTRAST  TECHNIQUE: Multidetector CT imaging of the chest, abdomen and pelvis was performed following the standard protocol during bolus administration of intravenous contrast.  CONTRAST:  51mL OMNIPAQUE IOHEXOL 300 MG/ML SOLN, 190mL OMNIPAQUE IOHEXOL 300 MG/ML SOLN  COMPARISON:  None.  FINDINGS: CT CHEST FINDINGS  Mediastinum/Nodes: There is no mediastinal or hilar lymphadenopathy. There is no mediastinal hematoma. The heart size is normal. There is no pericardial effusion.  Lungs/Pleura: There is no pulmonary mass or nodule. No focal pneumonia or pleural effusion is identified.   Musculoskeletal: No acute abnormality is identified within the  visualized bones.  CT ABDOMEN PELVIS FINDINGS  Hepatobiliary: There is a 9.1 x 6.1 cm heterogeneously enhancing mass in the left lobe liver. There is a 0.9 x 1.6 cm mild enhancing mass in the dome of the liver. There is a 1.9 x 1.6 cm enhancing mass at the junction of caudate and right lobe liver. A 3 mm low density lesion is identified in the inferior left lobe liver. The gallbladder is normal.  Pancreas: Normal.  Spleen: Normal.  Adrenals/Urinary Tract: The adrenal glands are normal. They kidneys are normal. There is no nephrolithiasis or hydroureteronephrosis bilaterally.  Stomach/Bowel: There is a question 5.8 cm conglomerate of matted bowel loops in the mid lower abdomen. There is no small bowel obstruction. The colon is normal.  Vascular/Lymphatic: The aorta is normal. There is no aneurysmal dilatation of the aorta. There is no abdominal lymphadenopathy.  Reproductive: There is a 6 x 6.9 cm heterogeneously enhancing mass question arising from the uterus. A second mass, inseparable from the uterus that is lower density than adjacent uterine myometrium measures 5.7 x 7.8 cm.  Other: None.  Musculoskeletal: No focal discrete lytic or blastic lesion is identified within the visualized bones.  IMPRESSION: No acute abnormality identified within the chest. No evidence of metastatic disease.  Several enhancing masses in the liver consistent with liver metastasis.  Masses within the pelvis likely of the Uterine origin. Given the findings within the liver, malignant neoplasm is not excluded.  A conglomerate of matted bowel loops in the mid lower abdomen. There is no evidence of small bowel obstruction.  Electronically Signed: By: Abelardo Diesel M.D. On: 04/06/2015 11:49    CBC  Recent Labs Lab 04/06/15 0945 04/07/15 0537  WBC 7.7 7.2  HGB 9.6* 8.9*  HCT 32.6* 30.9*  PLT 735* 597*  MCV 59.5* 60.2*  MCH 17.5* 17.3*  MCHC 29.4* 28.8*  RDW 29.4*  29.4*  LYMPHSABS 0.7  --   MONOABS 0.3  --   EOSABS 0.1  --   BASOSABS 0.0  --     Chemistries   Recent Labs Lab 04/06/15 0945 04/07/15 0537  NA 139 143  K 3.2* 3.3*  CL 104 107  CO2 24 28  GLUCOSE 105* 91  BUN 10 <5*  CREATININE 0.90 0.80  CALCIUM 9.5 9.1  AST 22  --   ALT 10*  --   ALKPHOS 63  --   BILITOT 0.8  --    ------------------------------------------------------------------------------------------------------------------ estimated creatinine clearance is 81 mL/min (by C-G formula based on Cr of 0.8). ------------------------------------------------------------------------------------------------------------------ No results for input(s): HGBA1C in the last 72 hours. ------------------------------------------------------------------------------------------------------------------ No results for input(s): CHOL, HDL, LDLCALC, TRIG, CHOLHDL, LDLDIRECT in the last 72 hours. ------------------------------------------------------------------------------------------------------------------ No results for input(s): TSH, T4TOTAL, T3FREE, THYROIDAB in the last 72 hours.  Invalid input(s): FREET3 ------------------------------------------------------------------------------------------------------------------  Recent Labs  04/06/15 1432  VITAMINB12 636  FOLATE 32.7  FERRITIN 8*  TIBC 406  IRON 17*  RETICCTPCT RESULTS UNAVAILABLE DUE TO INTERFERING SUBSTANCE    Coagulation profile  Recent Labs Lab 04/07/15 1100  INR 1.17    No results for input(s): DDIMER in the last 72 hours.  Cardiac Enzymes No results for input(s): CKMB, TROPONINI, MYOGLOBIN in the last 168 hours.  Invalid input(s): CK ------------------------------------------------------------------------------------------------------------------ Invalid input(s): POCBNP  No results for input(s): GLUCAP in the last 72 hours.   Dail Meece M.D. [facility] 04/07/2015, 12:22 PM  Pager:  [phone] Between 7am to 7pm - call Pager - [phone]  After 7pm go to [email or web address] -  password [email or web address]  Call night coverage person covering after 7pm

## 2015-04-07 NOTE — Consult Note (Signed)
Chief Complaint: Patient was seen in consultation today for image guided liver lesion biopsy Chief Complaint  Patient presents with  . 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 She was recently found to have a sigmoid rectal mass and has had intermittent rectal bleeding for the past 3 months.  Pt had colonoscopy 3 days ago but scope could not pass through mass- biopsy results pending. CT A/P 8/28 revealed several liver lesions . Request now received for image guided liver lesion biopsy.   Past Medical History  Diagnosis Date  . Hypertension   . Endometriosis   . Thalassanemia     Past Surgical History  Procedure Laterality Date  . Myomectomy      Gyn in [place]  . Diagnostic laparoscopy      Endometriosis    Allergies: Milk-related compounds and Nsaids  Medications: Prior to Admission medications   Medication Sig Start Date End Date Taking? Authorizing Provider  NIFEdipine (PROCARDIA-XL/ADALAT CC) 30 MG 24 hr tablet Take 30 mg by mouth daily.   Yes Historical Provider, MD     History reviewed. No pertinent family history.  Social History   Social History  . Marital Status: Married    Spouse Name: N/A  . Number of Children: N/A  . Years of Education: N/A   Occupational History  . Internal Medicine doctor     Works in [employer]  . Smoking status: Never Smoker   . Smokeless tobacco: None  . Alcohol Use: Yes  . Drug Use: No  . Sexual Activity: Not Asked   Other Topics Concern  . None   Social History Narrative      Review of Systems   Constitutional: Positive for unexpected weight change. Negative for fever and chills.  Respiratory:  Negative for cough and shortness of breath.   Cardiovascular: Negative for chest pain.  Gastrointestinal: Positive for nausea, vomiting and blood in stool.       Intermittent LLQ discomfort  Genitourinary: Negative for dysuria and hematuria.  Musculoskeletal: Negative for back pain.  Neurological: Negative for headaches.    Vital Signs: BP 121/73 mmHg  Pulse 75  Temp(Src) 98.1 F (36.7 C) (Oral)  Resp 19  Ht 5\' 6"  (1.676 m)  Wt 160 lb (72.576 kg)  BMI 25.84 kg/m2  SpO2 100%  Physical Exam  Constitutional: She is oriented to person, place, and time. She appears well-developed and well-nourished.  Cardiovascular: Normal rate and regular rhythm.   Pulmonary/Chest: Effort normal.  Sl dim BS rt base, left clear  Abdominal: Soft. Bowel sounds are normal.  Mild LLQ discomfort to palpation  Musculoskeletal: Normal range of motion. She exhibits no edema.  Neurological: She is alert and oriented to person, place, and time.    Mallampati Score:     Imaging: Ct Chest W Contrast  04/06/2015   ADDENDUM REPORT: 04/06/2015 13:18  ADDENDUM: Upon further evaluation, The masslike lesion in the pelvis previously question matted small bowel loops represent 9 cm sigmoid colon cancer.   Electronically Signed   By: Abelardo Diesel M.D.   On: 04/06/2015 13:18   04/06/2015   CLINICAL DATA:  Vomiting.  EXAM: CT CHEST, ABDOMEN, AND PELVIS WITH CONTRAST  TECHNIQUE: Multidetector CT imaging  of the chest, abdomen and pelvis was performed following the standard protocol during bolus administration of intravenous contrast.  CONTRAST:  70mL OMNIPAQUE IOHEXOL 300 MG/ML SOLN, 131mL OMNIPAQUE IOHEXOL 300 MG/ML SOLN  COMPARISON:  None.  FINDINGS: CT CHEST FINDINGS  Mediastinum/Nodes: There is no mediastinal or hilar lymphadenopathy. There is no mediastinal hematoma. The heart size is normal. There is no pericardial effusion.  Lungs/Pleura: There is no pulmonary mass or nodule. No focal pneumonia or pleural effusion is  identified.  Musculoskeletal: No acute abnormality is identified within the visualized bones.  CT ABDOMEN PELVIS FINDINGS  Hepatobiliary: There is a 9.1 x 6.1 cm heterogeneously enhancing mass in the left lobe liver. There is a 0.9 x 1.6 cm mild enhancing mass in the dome of the liver. There is a 1.9 x 1.6 cm enhancing mass at the junction of caudate and right lobe liver. A 3 mm low density lesion is identified in the inferior left lobe liver. The gallbladder is normal.  Pancreas: Normal.  Spleen: Normal.  Adrenals/Urinary Tract: The adrenal glands are normal. They kidneys are normal. There is no nephrolithiasis or hydroureteronephrosis bilaterally.  Stomach/Bowel: There is a question 5.8 cm conglomerate of matted bowel loops in the mid lower abdomen. There is no small bowel obstruction. The colon is normal.  Vascular/Lymphatic: The aorta is normal. There is no aneurysmal dilatation of the aorta. There is no abdominal lymphadenopathy.  Reproductive: There is a 6 x 6.9 cm heterogeneously enhancing mass question arising from the uterus. A second mass, inseparable from the uterus that is lower density than adjacent uterine myometrium measures 5.7 x 7.8 cm.  Other: None.  Musculoskeletal: No focal discrete lytic or blastic lesion is identified within the visualized bones.  IMPRESSION: No acute abnormality identified within the chest. No evidence of metastatic disease.  Several enhancing masses in the liver consistent with liver metastasis.  Masses within the pelvis likely of the Uterine origin. Given the findings within the liver, malignant neoplasm is not excluded.  A conglomerate of matted bowel loops in the mid lower abdomen. There is no evidence of small bowel obstruction.  Electronically Signed: By: Abelardo Diesel M.D. On: 04/06/2015 11:49   US Transvaginal Non-ob  04/06/2015   CLINICAL DATA:  Followup CT to evaluate possible uterine masses. History of uterine fibroids, endometriosis.  EXAM: TRANSABDOMINAL AND  TRANSVAGINAL ULTRASOUND OF PELVIS  TECHNIQUE: Both transabdominal and transvaginal ultrasound examinations of the pelvis were performed. Transabdominal technique was performed for global imaging of the pelvis including uterus, ovaries, adnexal regions, and pelvic cul-de-sac. It was necessary to proceed with endovaginal exam following the transabdominal exam to visualize the endometrium and ovaries.  COMPARISON:  CT today.  FINDINGS: Uterus  Measurements: 6.3 x 6.3 x 11.2 cm. There is at least 1 fibroid along the right side of the uterine body measuring 2.2 x 2.4 x 2.8 cm. Large mass seen on CT abutting the right-sided the lower uterus is not well demonstrated by ultrasound transabdominally nor endovaginally due to moderate adnexal bowel gas.  Endometrium  Thickness: 4 mm.  No focal abnormality visualized.  Right ovary  Measurements: Not visualized.  Left ovary  Measurements: Not visualized.  Other findings  No free fluid.  IMPRESSION: Mildly enlarged uterus with at least 1 fibroid over the right side of the uterine body measuring 2.8 cm. The large 7 cm mass seen on CT which appears to be arising from the right side of the lower uterine segment is not well demonstrated sonographically.  Nonvisualization  of the ovaries.  No free fluid.   Electronically Signed   By: Marin Olp M.D.   On: 04/06/2015 21:05   US Pelvis Complete  04/06/2015   CLINICAL DATA:  Followup CT to evaluate possible uterine masses. History of uterine fibroids, endometriosis.  EXAM: TRANSABDOMINAL AND TRANSVAGINAL ULTRASOUND OF PELVIS  TECHNIQUE: Both transabdominal and transvaginal ultrasound examinations of the pelvis were performed. Transabdominal technique was performed for global imaging of the pelvis including uterus, ovaries, adnexal regions, and pelvic cul-de-sac. It was necessary to proceed with endovaginal exam following the transabdominal exam to visualize the endometrium and ovaries.  COMPARISON:  CT today.  FINDINGS: Uterus   Measurements: 6.3 x 6.3 x 11.2 cm. There is at least 1 fibroid along the right side of the uterine body measuring 2.2 x 2.4 x 2.8 cm. Large mass seen on CT abutting the right-sided the lower uterus is not well demonstrated by ultrasound transabdominally nor endovaginally due to moderate adnexal bowel gas.  Endometrium  Thickness: 4 mm.  No focal abnormality visualized.  Right ovary  Measurements: Not visualized.  Left ovary  Measurements: Not visualized.  Other findings  No free fluid.  IMPRESSION: Mildly enlarged uterus with at least 1 fibroid over the right side of the uterine body measuring 2.8 cm. The large 7 cm mass seen on CT which appears to be arising from the right side of the lower uterine segment is not well demonstrated sonographically.  Nonvisualization of the ovaries.  No free fluid.   Electronically Signed   By: Marin Olp M.D.   On: 04/06/2015 21:05   Ct Abdomen Pelvis W Contrast  04/06/2015   ADDENDUM REPORT: 04/06/2015 13:18  ADDENDUM: Upon further evaluation, The masslike lesion in the pelvis previously question matted small bowel loops represent 9 cm sigmoid colon cancer.   Electronically Signed   By: Abelardo Diesel M.D.   On: 04/06/2015 13:18   04/06/2015   CLINICAL DATA:  Vomiting.  EXAM: CT CHEST, ABDOMEN, AND PELVIS WITH CONTRAST  TECHNIQUE: Multidetector CT imaging of the chest, abdomen and pelvis was performed following the standard protocol during bolus administration of intravenous contrast.  CONTRAST:  75mL OMNIPAQUE IOHEXOL 300 MG/ML SOLN, 175mL OMNIPAQUE IOHEXOL 300 MG/ML SOLN  COMPARISON:  None.  FINDINGS: CT CHEST FINDINGS  Mediastinum/Nodes: There is no mediastinal or hilar lymphadenopathy. There is no mediastinal hematoma. The heart size is normal. There is no pericardial effusion.  Lungs/Pleura: There is no pulmonary mass or nodule. No focal pneumonia or pleural effusion is identified.  Musculoskeletal: No acute abnormality is identified within the visualized bones.  CT  ABDOMEN PELVIS FINDINGS  Hepatobiliary: There is a 9.1 x 6.1 cm heterogeneously enhancing mass in the left lobe liver. There is a 0.9 x 1.6 cm mild enhancing mass in the dome of the liver. There is a 1.9 x 1.6 cm enhancing mass at the junction of caudate and right lobe liver. A 3 mm low density lesion is identified in the inferior left lobe liver. The gallbladder is normal.  Pancreas: Normal.  Spleen: Normal.  Adrenals/Urinary Tract: The adrenal glands are normal. They kidneys are normal. There is no nephrolithiasis or hydroureteronephrosis bilaterally.  Stomach/Bowel: There is a question 5.8 cm conglomerate of matted bowel loops in the mid lower abdomen. There is no small bowel obstruction. The colon is normal.  Vascular/Lymphatic: The aorta is normal. There is no aneurysmal dilatation of the aorta. There is no abdominal lymphadenopathy.  Reproductive: There is a 6 x 6.9 cm  heterogeneously enhancing mass question arising from the uterus. A second mass, inseparable from the uterus that is lower density than adjacent uterine myometrium measures 5.7 x 7.8 cm.  Other: None.  Musculoskeletal: No focal discrete lytic or blastic lesion is identified within the visualized bones.  IMPRESSION: No acute abnormality identified within the chest. No evidence of metastatic disease.  Several enhancing masses in the liver consistent with liver metastasis.  Masses within the pelvis likely of the Uterine origin. Given the findings within the liver, malignant neoplasm is not excluded.  A conglomerate of matted bowel loops in the mid lower abdomen. There is no evidence of small bowel obstruction.  Electronically Signed: By: Abelardo Diesel M.D. On: 04/06/2015 11:49    Labs:  CBC:  Recent Labs  04/06/15 0945 04/07/15 0537  WBC 7.7 7.2  HGB 9.6* 8.9*  HCT 32.6* 30.9*  PLT 735* 597*    COAGS: No results for input(s): INR, APTT in the last 8760 hours.  BMP:  Recent Labs  04/06/15 0945 04/07/15 0537  NA 139 143  K  3.2* 3.3*  CL 104 107  CO2 24 28  GLUCOSE 105* 91  BUN 10 <5*  CALCIUM 9.5 9.1  CREATININE 0.90 0.80  GFRNONAA >60 >60  GFRAA >60 >60    LIVER FUNCTION TESTS:  Recent Labs  04/06/15 0945  BILITOT 0.8  AST 22  ALT 10*  ALKPHOS 63  PROT 7.7  ALBUMIN 3.9    TUMOR MARKERS:  Recent Labs  04/06/15 1432  CEA 467.3*  CA199 1605*    Assessment and Plan: Pt with sigmoid/rectal mass, anemia, liver lesions. Sigmoid mass path pending. Request initially received for image guided liver lesion biopsy; however, [facility] has now scheduled pt for sigmoid colectomy tomorrow and will perform liver bx at that time. Pt aware of plans.     Thank you for this interesting consult.  I greatly enjoyed meeting Christina [name] and look forward to participating in their care.  A copy of this report was sent to the requesting provider on this date.  Signed: D. Rowe Robert 04/07/2015, 10:50 AM   I spent a total of 20 minutes in face to face in clinical consultation, greater than 50% of which was counseling/coordinating care for image guided liver lesion biopsy

## 2015-04-07 NOTE — Progress Notes (Signed)
[facility]  [address] [place], [place] [address] Phone: [phone] FAX: [phone]   Lonnetta Kniskern [id number] 04/19/1960   Problem List:   Principal Problem:   Nausea & vomiting Active Problems:   Liver metastasis from colorectal cancer   Cancer of sigmoid colon - partially obstructing    Pelvic mass - 9cm - ?fibroid vs drop metastasis   Hypokalemia   Anemia   Hypertension   Thalassanemia        Assessment  Near obstructing long sigmoid mass strongly suspicious for colorectal cancer.  Pathology pending  Large left lateral liver and a few small right liver masses strongly suspicious for metastatic colorectal cancer.  Large pelvic mass adherent to left upper uterus strongly suspicious for benign fibroid.  Plan:  Spent the past hour performing history and physical, reviewing films, discussing with my partners & patient & Dr Burr Medico.  The initial gastroenterologist, Dr. Collene Mares, had discussed with the initial surgeon, Dr. Zella Richer before the patient became admitted.    I believe that the colonic mass needs to be palliated since it is partially obstructing at the least with symptoms.  Dr. Burr Medico would like to see if the gastroenterology team could perhaps stent this mass to palliate the obstruction.  That would allow the patient to get to palliative chemotherapy faster, dependent on the pathology results which should be back by tomorrow morning.  ISurgery will wait to see what gastroenterology thinks.  If the patient is not able to be stented, I would proceed with palliative sigmoid colectomy.  Because she is not massively dilated/anemia/unstable, hopefully can do re-anastomosis in the absence of carcinomatosis or other surprises.  Try minimally invasive approach.  Laparoscopic versus robotic depending on equipment availability.  I would leave the presumed pelvic mass (fibroid) alone since she does not seem to having any pelvic  pain or bleeding issues.  While she has had endometriosis and and a myomectomy surgery in the past, it has been many years without any issues.  Would not want to make the surgery more complicated.  At some point she needs biopsy of the liver to diagnose the mass and confirm suspicion of metastatic disease.  If it comes to surgery, could do that at the same time.  Otherwise continue plan of interventional radiology perc core biopsy if primary can be palliated with stenting.  VTE prophylaxis- SCDs, etc  Mobilize as tolerated to help recovery  I updated the patient's status to the patient.  Recommendations were made.  Questions were answered.  She is amenable to surgery or stenting or chemotherapy as needed.  The patient expressed understanding & appreciation.   [name], M.D., F.A.C.S. Gastrointestinal and Minimally Invasive Surgery [facility] [address], [place] [address] [place], [place] [address] [phone] Main / Paging   04/07/2015  Subjective:  Chart reviewed.  History physical done.  Patient with some left lower quadrant discomfort but no vomiting.  Some nausea.  Tolerating some sips of clears.  Objective:  Vital signs:  Filed Vitals:   04/06/15 1131 04/06/15 1330 04/06/15 2135 04/07/15 0520  BP: 147/75 147/72 124/66 121/73  Pulse: 80 78 73 75  Temp:  98.6 F (37 C) 98.3 F (36.8 C) 98.1 F (36.7 C)  TempSrc:  Oral Oral Oral  Resp: 18 20 18 19   Height:  5' 6"  (1.676 m)    Weight:  72.576 kg (160 lb)    SpO2: 100% 100% 100% 100%  Last BM Date: 04/06/15  Intake/Output   Yesterday:  08/28 0701 - 08/29 0700 In: 1398.8 [P.O.:240; I.V.:1158.8] Out: -  This shift:     Bowel function:  Flatus: y  BM: small  Drain: n/a  Physical Exam:  General: Pt awake/alert/oriented x4 in no acute distress.  Calm, smiling Eyes: PERRL, normal EOM.  Sclera clear.  No icterus Neuro: CN II-XII intact w/o focal sensory/motor deficits. Lymph:  No head/neck/groin lymphadenopathy Psych:  No delerium/psychosis/paranoia HENT: Normocephalic, Mucus membranes moist.  No thrush Neck: Supple, No tracheal deviation Chest: No chest wall pain w good excursion CV:  Pulses intact.  Regular rhythm MS: Normal AROM mjr joints.  No obvious deformity Abdomen: Soft.  Nondistended.  Mildly tender at LLQ only.  No evidence of peritonitis.  No incarcerated hernias. Ext:  SCDs BLE.  No mjr edema.  No cyanosis Skin: No petechiae / purpura  Results:   Labs: Results for orders placed or performed during the hospital encounter of 04/06/15 (from the past 48 hour(s))  CBC with Differential/Platelet     Status: Abnormal   Collection Time: 04/06/15  9:45 AM  Result Value Ref Range   WBC 7.7 4.0 - 10.5 K/uL   RBC 5.48 (H) 3.87 - 5.11 MIL/uL   Hemoglobin 9.6 (L) 12.0 - 15.0 g/dL   HCT 32.6 (L) 36.0 - 46.0 %   MCV 59.5 (L) 78.0 - 100.0 fL   MCH 17.5 (L) 26.0 - 34.0 pg   MCHC 29.4 (L) 30.0 - 36.0 g/dL   RDW 29.4 (H) 11.5 - 15.5 %   Platelets 735 (H) 150 - 400 K/uL    Comment: RESULT REPEATED AND VERIFIED   Neutrophils Relative % 86 (H) 43 - 77 %   Lymphocytes Relative 9 (L) 12 - 46 %   Monocytes Relative 4 3 - 12 %   Eosinophils Relative 1 0 - 5 %   Basophils Relative 0 0 - 1 %   Neutro Abs 6.6 1.7 - 7.7 K/uL   Lymphs Abs 0.7 0.7 - 4.0 K/uL   Monocytes Absolute 0.3 0.1 - 1.0 K/uL   Eosinophils Absolute 0.1 0.0 - 0.7 K/uL   Basophils Absolute 0.0 0.0 - 0.1 K/uL   RBC Morphology TEARDROP CELLS     Comment: TARGET CELLS SCHISTOCYTES PRESENT (2-5/hpf)    Smear Review PLATELET COUNT CONFIRMED BY SMEAR   Comprehensive metabolic panel     Status: Abnormal   Collection Time: 04/06/15  9:45 AM  Result Value Ref Range   Sodium 139 135 - 145 mmol/L   Potassium 3.2 (L) 3.5 - 5.1 mmol/L   Chloride 104 101 - 111 mmol/L   CO2 24 22 - 32 mmol/L   Glucose, Bld 105 (H) 65 - 99 mg/dL   BUN 10 6 - 20 mg/dL   Creatinine, Ser 0.90 0.44 - 1.00 mg/dL   Calcium  9.5 8.9 - 10.3 mg/dL   Total Protein 7.7 6.5 - 8.1 g/dL   Albumin 3.9 3.5 - 5.0 g/dL   AST 22 15 - 41 U/L   ALT 10 (L) 14 - 54 U/L   Alkaline Phosphatase 63 38 - 126 U/L   Total Bilirubin 0.8 0.3 - 1.2 mg/dL   GFR calc non Af Amer >60 >60 mL/min   GFR calc Af Amer >60 >60 mL/min    Comment: (NOTE) The eGFR has been calculated using the CKD EPI equation. This calculation has not been validated in all clinical situations. eGFR's persistently <60 mL/min signify possible  Chronic Kidney Disease.    Anion gap 11 5 - 15  Lipase, blood     Status: Abnormal   Collection Time: 04/06/15  9:45 AM  Result Value Ref Range   Lipase 15 (L) 22 - 51 U/L  CEA     Status: Abnormal   Collection Time: 04/06/15  2:32 PM  Result Value Ref Range   CEA 467.3 (H) 0.0 - 4.7 ng/mL    Comment: (NOTE)       Roche ECLIA methodology       Nonsmokers  <3.9                                     Smokers     <5.6 Performed At: [facility] [place], [place] [id number] Lindon Romp MD [id number]   CA 125     Status: None   Collection Time: 04/06/15  2:32 PM  Result Value Ref Range   CA 125 11.9 0.0 - 38.1 U/mL    Comment: (NOTE) Roche ECLIA methodology Performed At: [facility] [place], [place] [id number] Lindon Romp MD [id number]   Cancer antigen 19-9     Status: Abnormal   Collection Time: 04/06/15  2:32 PM  Result Value Ref Range   CA 19-9 1605 (H) 0 - 35 U/mL    Comment: (NOTE) Results confirmed on dilution. Roche ECLIA methodology Performed At: [facility] [place], [place] [id number] Lindon Romp MD [id number]   Vitamin B12     Status: None   Collection Time: 04/06/15  2:32 PM  Result Value Ref Range   Vitamin B-12 636 180 - 914 pg/mL    Comment: (NOTE) This assay is not validated for testing neonatal or myeloproliferative syndrome specimens for Vitamin B12 levels. Performed at [facility]    Folate     Status: None   Collection Time: 04/06/15  2:32 PM  Result Value Ref Range   Folate 32.7 >5.9 ng/mL    Comment: Performed at [facility]  Iron and TIBC     Status: Abnormal   Collection Time: 04/06/15  2:32 PM  Result Value Ref Range   Iron 17 (L) 28 - 170 ug/dL   TIBC 406 250 - 450 ug/dL   Saturation Ratios 4 (L) 10.4 - 31.8 %   UIBC 389 ug/dL    Comment: Performed at [facility]  Ferritin     Status: Abnormal   Collection Time: 04/06/15  2:32 PM  Result Value Ref Range   Ferritin 8 (L) 11 - 307 ng/mL    Comment: Performed at [facility]  Reticulocytes     Status: Abnormal   Collection Time: 04/06/15  2:32 PM  Result Value Ref Range   Retic Ct Pct RESULTS UNAVAILABLE DUE TO INTERFERING SUBSTANCE 0.4 - 3.1 %   RBC. 5.49 (H) 3.87 - 5.11 MIL/uL   Retic Count, Manual NOT CALCULATED 19.0 - 186.0 K/uL  Ammonia     Status: None   Collection Time: 04/06/15  2:32 PM  Result Value Ref Range   Ammonia 17 9 - 35 umol/L  Basic metabolic panel     Status: Abnormal   Collection Time: 04/07/15  5:37 AM  Result Value Ref Range   Sodium 143 135 - 145 mmol/L   Potassium 3.3 (L) 3.5 - 5.1 mmol/L   Chloride 107  101 - 111 mmol/L   CO2 28 22 - 32 mmol/L   Glucose, Bld 91 65 - 99 mg/dL   BUN <5 (L) 6 - 20 mg/dL   Creatinine, Ser 0.80 0.44 - 1.00 mg/dL   Calcium 9.1 8.9 - 10.3 mg/dL   GFR calc non Af Amer >60 >60 mL/min   GFR calc Af Amer >60 >60 mL/min    Comment: (NOTE) The eGFR has been calculated using the CKD EPI equation. This calculation has not been validated in all clinical situations. eGFR's persistently <60 mL/min signify possible Chronic Kidney Disease.    Anion gap 8 5 - 15  CBC     Status: Abnormal   Collection Time: 04/07/15  5:37 AM  Result Value Ref Range   WBC 7.2 4.0 - 10.5 K/uL   RBC 5.13 (H) 3.87 - 5.11 MIL/uL   Hemoglobin 8.9 (L) 12.0 - 15.0 g/dL   HCT 30.9 (L) 36.0 - 46.0 %   MCV 60.2 (L) 78.0 - 100.0 fL   MCH 17.3 (L) 26.0 -  34.0 pg   MCHC 28.8 (L) 30.0 - 36.0 g/dL   RDW 29.4 (H) 11.5 - 15.5 %   Platelets 597 (H) 150 - 400 K/uL    Imaging / Studies: Ct Chest W Contrast  04/06/2015   ADDENDUM REPORT: 04/06/2015 13:18  ADDENDUM: Upon further evaluation, The masslike lesion in the pelvis previously question matted small bowel loops represent 9 cm sigmoid colon cancer.   Electronically Signed   By: Abelardo Diesel M.D.   On: 04/06/2015 13:18   04/06/2015   CLINICAL DATA:  Vomiting.  EXAM: CT CHEST, ABDOMEN, AND PELVIS WITH CONTRAST  TECHNIQUE: Multidetector CT imaging of the chest, abdomen and pelvis was performed following the standard protocol during bolus administration of intravenous contrast.  CONTRAST:  57m OMNIPAQUE IOHEXOL 300 MG/ML SOLN, 1025mOMNIPAQUE IOHEXOL 300 MG/ML SOLN  COMPARISON:  None.  FINDINGS: CT CHEST FINDINGS  Mediastinum/Nodes: There is no mediastinal or hilar lymphadenopathy. There is no mediastinal hematoma. The heart size is normal. There is no pericardial effusion.  Lungs/Pleura: There is no pulmonary mass or nodule. No focal pneumonia or pleural effusion is identified.  Musculoskeletal: No acute abnormality is identified within the visualized bones.  CT ABDOMEN PELVIS FINDINGS  Hepatobiliary: There is a 9.1 x 6.1 cm heterogeneously enhancing mass in the left lobe liver. There is a 0.9 x 1.6 cm mild enhancing mass in the dome of the liver. There is a 1.9 x 1.6 cm enhancing mass at the junction of caudate and right lobe liver. A 3 mm low density lesion is identified in the inferior left lobe liver. The gallbladder is normal.  Pancreas: Normal.  Spleen: Normal.  Adrenals/Urinary Tract: The adrenal glands are normal. They kidneys are normal. There is no nephrolithiasis or hydroureteronephrosis bilaterally.  Stomach/Bowel: There is a question 5.8 cm conglomerate of matted bowel loops in the mid lower abdomen. There is no small bowel obstruction. The colon is normal.  Vascular/Lymphatic: The aorta is normal.  There is no aneurysmal dilatation of the aorta. There is no abdominal lymphadenopathy.  Reproductive: There is a 6 x 6.9 cm heterogeneously enhancing mass question arising from the uterus. A second mass, inseparable from the uterus that is lower density than adjacent uterine myometrium measures 5.7 x 7.8 cm.  Other: None.  Musculoskeletal: No focal discrete lytic or blastic lesion is identified within the visualized bones.  IMPRESSION: No acute abnormality identified within the chest. No evidence of metastatic  disease.  Several enhancing masses in the liver consistent with liver metastasis.  Masses within the pelvis likely of the Uterine origin. Given the findings within the liver, malignant neoplasm is not excluded.  A conglomerate of matted bowel loops in the mid lower abdomen. There is no evidence of small bowel obstruction.  Electronically Signed: By: Abelardo Diesel M.D. On: 04/06/2015 11:49   US Transvaginal Non-ob  04/06/2015   CLINICAL DATA:  Followup CT to evaluate possible uterine masses. History of uterine fibroids, endometriosis.  EXAM: TRANSABDOMINAL AND TRANSVAGINAL ULTRASOUND OF PELVIS  TECHNIQUE: Both transabdominal and transvaginal ultrasound examinations of the pelvis were performed. Transabdominal technique was performed for global imaging of the pelvis including uterus, ovaries, adnexal regions, and pelvic cul-de-sac. It was necessary to proceed with endovaginal exam following the transabdominal exam to visualize the endometrium and ovaries.  COMPARISON:  CT today.  FINDINGS: Uterus  Measurements: 6.3 x 6.3 x 11.2 cm. There is at least 1 fibroid along the right side of the uterine body measuring 2.2 x 2.4 x 2.8 cm. Large mass seen on CT abutting the right-sided the lower uterus is not well demonstrated by ultrasound transabdominally nor endovaginally due to moderate adnexal bowel gas.  Endometrium  Thickness: 4 mm.  No focal abnormality visualized.  Right ovary  Measurements: Not visualized.   Left ovary  Measurements: Not visualized.  Other findings  No free fluid.  IMPRESSION: Mildly enlarged uterus with at least 1 fibroid over the right side of the uterine body measuring 2.8 cm. The large 7 cm mass seen on CT which appears to be arising from the right side of the lower uterine segment is not well demonstrated sonographically.  Nonvisualization of the ovaries.  No free fluid.   Electronically Signed   By: Marin Olp M.D.   On: 04/06/2015 21:05   US Pelvis Complete  04/06/2015   CLINICAL DATA:  Followup CT to evaluate possible uterine masses. History of uterine fibroids, endometriosis.  EXAM: TRANSABDOMINAL AND TRANSVAGINAL ULTRASOUND OF PELVIS  TECHNIQUE: Both transabdominal and transvaginal ultrasound examinations of the pelvis were performed. Transabdominal technique was performed for global imaging of the pelvis including uterus, ovaries, adnexal regions, and pelvic cul-de-sac. It was necessary to proceed with endovaginal exam following the transabdominal exam to visualize the endometrium and ovaries.  COMPARISON:  CT today.  FINDINGS: Uterus  Measurements: 6.3 x 6.3 x 11.2 cm. There is at least 1 fibroid along the right side of the uterine body measuring 2.2 x 2.4 x 2.8 cm. Large mass seen on CT abutting the right-sided the lower uterus is not well demonstrated by ultrasound transabdominally nor endovaginally due to moderate adnexal bowel gas.  Endometrium  Thickness: 4 mm.  No focal abnormality visualized.  Right ovary  Measurements: Not visualized.  Left ovary  Measurements: Not visualized.  Other findings  No free fluid.  IMPRESSION: Mildly enlarged uterus with at least 1 fibroid over the right side of the uterine body measuring 2.8 cm. The large 7 cm mass seen on CT which appears to be arising from the right side of the lower uterine segment is not well demonstrated sonographically.  Nonvisualization of the ovaries.  No free fluid.   Electronically Signed   By: Marin Olp M.D.   On:  04/06/2015 21:05   Ct Abdomen Pelvis W Contrast  04/06/2015   ADDENDUM REPORT: 04/06/2015 13:18  ADDENDUM: Upon further evaluation, The masslike lesion in the pelvis previously question matted small bowel loops represent 9 cm sigmoid  colon cancer.   Electronically Signed   By: Abelardo Diesel M.D.   On: 04/06/2015 13:18   04/06/2015   CLINICAL DATA:  Vomiting.  EXAM: CT CHEST, ABDOMEN, AND PELVIS WITH CONTRAST  TECHNIQUE: Multidetector CT imaging of the chest, abdomen and pelvis was performed following the standard protocol during bolus administration of intravenous contrast.  CONTRAST:  69m OMNIPAQUE IOHEXOL 300 MG/ML SOLN, 1064mOMNIPAQUE IOHEXOL 300 MG/ML SOLN  COMPARISON:  None.  FINDINGS: CT CHEST FINDINGS  Mediastinum/Nodes: There is no mediastinal or hilar lymphadenopathy. There is no mediastinal hematoma. The heart size is normal. There is no pericardial effusion.  Lungs/Pleura: There is no pulmonary mass or nodule. No focal pneumonia or pleural effusion is identified.  Musculoskeletal: No acute abnormality is identified within the visualized bones.  CT ABDOMEN PELVIS FINDINGS  Hepatobiliary: There is a 9.1 x 6.1 cm heterogeneously enhancing mass in the left lobe liver. There is a 0.9 x 1.6 cm mild enhancing mass in the dome of the liver. There is a 1.9 x 1.6 cm enhancing mass at the junction of caudate and right lobe liver. A 3 mm low density lesion is identified in the inferior left lobe liver. The gallbladder is normal.  Pancreas: Normal.  Spleen: Normal.  Adrenals/Urinary Tract: The adrenal glands are normal. They kidneys are normal. There is no nephrolithiasis or hydroureteronephrosis bilaterally.  Stomach/Bowel: There is a question 5.8 cm conglomerate of matted bowel loops in the mid lower abdomen. There is no small bowel obstruction. The colon is normal.  Vascular/Lymphatic: The aorta is normal. There is no aneurysmal dilatation of the aorta. There is no abdominal lymphadenopathy.  Reproductive:  There is a 6 x 6.9 cm heterogeneously enhancing mass question arising from the uterus. A second mass, inseparable from the uterus that is lower density than adjacent uterine myometrium measures 5.7 x 7.8 cm.  Other: None.  Musculoskeletal: No focal discrete lytic or blastic lesion is identified within the visualized bones.  IMPRESSION: No acute abnormality identified within the chest. No evidence of metastatic disease.  Several enhancing masses in the liver consistent with liver metastasis.  Masses within the pelvis likely of the Uterine origin. Given the findings within the liver, malignant neoplasm is not excluded.  A conglomerate of matted bowel loops in the mid lower abdomen. There is no evidence of small bowel obstruction.  Electronically Signed: By: WeAbelardo Diesel.D. On: 04/06/2015 11:49    Medications / Allergies: per chart  Antibiotics: Anti-infectives    None        Note: Portions of this report may have been transcribed using voice recognition software. Every effort was made to ensure accuracy; however, inadvertent computerized transcription errors may be present.   Any transcriptional errors that result from this process are unintentional.     StAdin HectorM.D., F.A.C.S. Gastrointestinal and Minimally Invasive Surgery [facility] [id number] [phone] / Paging   04/07/2015  CARE TEAM:  PCP: OSBenito MccreedyMD  Outpatient Care Team: Patient Care Team: GeBenito MccreedyMD as PCP - General (Internal Medicine) JyJuanita CraverMD as Consulting Physician (Gastroenterology)  Inpatient Treatment Team: Treatment Team: Attending Provider: RiMendel CorningMD; Consulting Physician: MdNolon NationsMD; Consulting Physician: JyJuanita CraverMD; Technician: SaGuadalupe MapleNTHawaiiConsulting Physician: YaTruitt MerleMD; Registered Nurse: PaRoe RutherfordRN; Rounding Team: WlThreasa BeardsMD; Technician: CrMertha Baars NT; Registered Nurse: KaLavell LusterRN

## 2015-04-07 NOTE — Progress Notes (Deleted)
HPI: 54 year old black female, initially seen by me in consultation on 04/03/15 for rectal bleeding [that she has had for about 3 months] and severe anemia requiring transfusions last week when she had bled down to 6.2 gm/dl had a flexible sigmoidoscopy [changed from a colonoscopy as I could not pass the scope through the mass] with biopsies of a large sigmoid colon near obstructing mass, presented to the ER with nausea and vomiting over the weekend-multiple liver mets on admission CT. Dr, Burr Medico has contacted me about stenting this mass so that she could proceed with chemotherapy. Appreciate all the help from the consultants.  PMH 1) Uterine fibroids with history of DUB. 2) HTN. 3) Endometriosis 4) Thalassemia.   Family History 2 brothers died of AIDS in their 20's. One maternal first cousin with colon cancer in their 6's. She has 2 other sisters in [place] who are healthy.  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 Denies ETOH or drug use.   Vital signs in last 24 hours: Temp:  [98 F (36.7 C)-98.3 F (36.8 C)] 98 F (36.7 C) (08/29 1350) Pulse Rate:  [73-77] 77 (08/29 1350) Resp:  [18-19] 18 (08/29 1350) BP: (121-157)/(66-79) 157/79 mmHg (08/29 1350) SpO2:  [100 %] 100 % (08/29 1350) Last BM Date: 04/07/15  Intake/Output from previous day: 08/28 0701 - 08/29 0700 In: 1398.8 [P.O.:240; I.V.:1158.8] Out: -  Intake/Output this shift:   General appearance: alert, cooperative, appears older than stated age and no distress Resp: clear to auscultation bilaterally Cardio: regular rate and rhythm, S1, S2 normal, no murmur, click, rub or gallop GI: soft with LLQ tenderness on palpation without gaurding; bowel sounds normal; no masses,  no organomegaly  Lab Results:  Recent Labs  04/06/15 0945 04/07/15 0537  WBC 7.7 7.2  HGB 9.6* 8.9*  HCT 32.6* 30.9*  PLT 735* 597*   BMET  Recent Labs  04/06/15 0945 04/07/15 0537  NA 139 143  K 3.2*  3.3*  CL 104 107  CO2 24 28  GLUCOSE 105* 91  BUN 10 <5*  CREATININE 0.90 0.80  CALCIUM 9.5 9.1   LFT  Recent Labs  04/06/15 0945  PROT 7.7  ALBUMIN 3.9  AST 22  ALT 10*  ALKPHOS 63  BILITOT 0.8   PT/INR  Recent Labs  04/07/15 1100  LABPROT 15.1  INR 1.17   Studies/Results: Ct Chest W Contrast  04/06/2015   ADDENDUM REPORT: 04/06/2015 13:18  ADDENDUM: Upon further evaluation, The masslike lesion in the pelvis previously question matted small bowel loops represent 9 cm sigmoid colon cancer.   Electronically Signed   By: Abelardo Diesel M.D.   On: 04/06/2015 13:18   04/06/2015   CLINICAL DATA:  Vomiting.  EXAM: CT CHEST, ABDOMEN, AND PELVIS WITH CONTRAST  TECHNIQUE: Multidetector CT imaging of the chest, abdomen and pelvis was performed following the standard protocol during bolus administration of intravenous contrast.  CONTRAST:  69mL OMNIPAQUE IOHEXOL 300 MG/ML SOLN, 167mL OMNIPAQUE IOHEXOL 300 MG/ML SOLN  COMPARISON:  None.  FINDINGS: CT CHEST FINDINGS  Mediastinum/Nodes: There is no mediastinal or hilar lymphadenopathy. There is no mediastinal hematoma. The heart size is normal. There is no pericardial effusion.  Lungs/Pleura: There is no pulmonary mass or nodule. No focal pneumonia or pleural effusion is identified.  Musculoskeletal: No acute abnormality is identified within the visualized bones.  CT ABDOMEN PELVIS FINDINGS  Hepatobiliary: There is a 9.1 x 6.1 cm heterogeneously enhancing mass in the left  lobe liver. There is a 0.9 x 1.6 cm mild enhancing mass in the dome of the liver. There is a 1.9 x 1.6 cm enhancing mass at the junction of caudate and right lobe liver. A 3 mm low density lesion is identified in the inferior left lobe liver. The gallbladder is normal.  Pancreas: Normal.  Spleen: Normal.  Adrenals/Urinary Tract: The adrenal glands are normal. They kidneys are normal. There is no nephrolithiasis or hydroureteronephrosis bilaterally.  Stomach/Bowel: There is a  question 5.8 cm conglomerate of matted bowel loops in the mid lower abdomen. There is no small bowel obstruction. The colon is normal.  Vascular/Lymphatic: The aorta is normal. There is no aneurysmal dilatation of the aorta. There is no abdominal lymphadenopathy.  Reproductive: There is a 6 x 6.9 cm heterogeneously enhancing mass question arising from the uterus. A second mass, inseparable from the uterus that is lower density than adjacent uterine myometrium measures 5.7 x 7.8 cm.  Other: None.  Musculoskeletal: No focal discrete lytic or blastic lesion is identified within the visualized bones.  IMPRESSION: No acute abnormality identified within the chest. No evidence of metastatic disease.  Several enhancing masses in the liver consistent with liver metastasis.  Masses within the pelvis likely of the Uterine origin. Given the findings within the liver, malignant neoplasm is not excluded.  A conglomerate of matted bowel loops in the mid lower abdomen. There is no evidence of small bowel obstruction.  Electronically Signed: By: Abelardo Diesel M.D. On: 04/06/2015 11:49   US Transvaginal Non-ob  04/06/2015   CLINICAL DATA:  Followup CT to evaluate possible uterine masses. History of uterine fibroids, endometriosis.  EXAM: TRANSABDOMINAL AND TRANSVAGINAL ULTRASOUND OF PELVIS  TECHNIQUE: Both transabdominal and transvaginal ultrasound examinations of the pelvis were performed. Transabdominal technique was performed for global imaging of the pelvis including uterus, ovaries, adnexal regions, and pelvic cul-de-sac. It was necessary to proceed with endovaginal exam following the transabdominal exam to visualize the endometrium and ovaries.  COMPARISON:  CT today.  FINDINGS: Uterus  Measurements: 6.3 x 6.3 x 11.2 cm. There is at least 1 fibroid along the right side of the uterine body measuring 2.2 x 2.4 x 2.8 cm. Large mass seen on CT abutting the right-sided the lower uterus is not well demonstrated by ultrasound  transabdominally nor endovaginally due to moderate adnexal bowel gas.  Endometrium  Thickness: 4 mm.  No focal abnormality visualized.  Right ovary  Measurements: Not visualized.  Left ovary  Measurements: Not visualized.  Other findings  No free fluid.  IMPRESSION: Mildly enlarged uterus with at least 1 fibroid over the right side of the uterine body measuring 2.8 cm. The large 7 cm mass seen on CT which appears to be arising from the right side of the lower uterine segment is not well demonstrated sonographically.  Nonvisualization of the ovaries.  No free fluid.   Electronically Signed   By: Marin Olp M.D.   On: 04/06/2015 21:05   US Pelvis Complete  04/06/2015   CLINICAL DATA:  Followup CT to evaluate possible uterine masses. History of uterine fibroids, endometriosis.  EXAM: TRANSABDOMINAL AND TRANSVAGINAL ULTRASOUND OF PELVIS  TECHNIQUE: Both transabdominal and transvaginal ultrasound examinations of the pelvis were performed. Transabdominal technique was performed for global imaging of the pelvis including uterus, ovaries, adnexal regions, and pelvic cul-de-sac. It was necessary to proceed with endovaginal exam following the transabdominal exam to visualize the endometrium and ovaries.  COMPARISON:  CT today.  FINDINGS: Uterus  Measurements:  6.3 x 6.3 x 11.2 cm. There is at least 1 fibroid along the right side of the uterine body measuring 2.2 x 2.4 x 2.8 cm. Large mass seen on CT abutting the right-sided the lower uterus is not well demonstrated by ultrasound transabdominally nor endovaginally due to moderate adnexal bowel gas.  Endometrium  Thickness: 4 mm.  No focal abnormality visualized.  Right ovary  Measurements: Not visualized.  Left ovary  Measurements: Not visualized.  Other findings  No free fluid.  IMPRESSION: Mildly enlarged uterus with at least 1 fibroid over the right side of the uterine body measuring 2.8 cm. The large 7 cm mass seen on CT which appears to be arising from the right side  of the lower uterine segment is not well demonstrated sonographically.  Nonvisualization of the ovaries.  No free fluid.   Electronically Signed   By: Marin Olp M.D.   On: 04/06/2015 21:05   Ct Abdomen Pelvis W Contrast  04/06/2015   ADDENDUM REPORT: 04/06/2015 13:18  ADDENDUM: Upon further evaluation, The masslike lesion in the pelvis previously question matted small bowel loops represent 9 cm sigmoid colon cancer.   Electronically Signed   By: Abelardo Diesel M.D.   On: 04/06/2015 13:18   04/06/2015   CLINICAL DATA:  Vomiting.  EXAM: CT CHEST, ABDOMEN, AND PELVIS WITH CONTRAST  TECHNIQUE: Multidetector CT imaging of the chest, abdomen and pelvis was performed following the standard protocol during bolus administration of intravenous contrast.  CONTRAST:  78mL OMNIPAQUE IOHEXOL 300 MG/ML SOLN, 161mL OMNIPAQUE IOHEXOL 300 MG/ML SOLN  COMPARISON:  None.  FINDINGS: CT CHEST FINDINGS  Mediastinum/Nodes: There is no mediastinal or hilar lymphadenopathy. There is no mediastinal hematoma. The heart size is normal. There is no pericardial effusion.  Lungs/Pleura: There is no pulmonary mass or nodule. No focal pneumonia or pleural effusion is identified.  Musculoskeletal: No acute abnormality is identified within the visualized bones.  CT ABDOMEN PELVIS FINDINGS  Hepatobiliary: There is a 9.1 x 6.1 cm heterogeneously enhancing mass in the left lobe liver. There is a 0.9 x 1.6 cm mild enhancing mass in the dome of the liver. There is a 1.9 x 1.6 cm enhancing mass at the junction of caudate and right lobe liver. A 3 mm low density lesion is identified in the inferior left lobe liver. The gallbladder is normal.  Pancreas: Normal.  Spleen: Normal.  Adrenals/Urinary Tract: The adrenal glands are normal. They kidneys are normal. There is no nephrolithiasis or hydroureteronephrosis bilaterally.  Stomach/Bowel: There is a question 5.8 cm conglomerate of matted bowel loops in the mid lower abdomen. There is no small bowel  obstruction. The colon is normal.  Vascular/Lymphatic: The aorta is normal. There is no aneurysmal dilatation of the aorta. There is no abdominal lymphadenopathy.  Reproductive: There is a 6 x 6.9 cm heterogeneously enhancing mass question arising from the uterus. A second mass, inseparable from the uterus that is lower density than adjacent uterine myometrium measures 5.7 x 7.8 cm.  Other: None.  Musculoskeletal: No focal discrete lytic or blastic lesion is identified within the visualized bones.  IMPRESSION: No acute abnormality identified within the chest. No evidence of metastatic disease.  Several enhancing masses in the liver consistent with liver metastasis.  Masses within the pelvis likely of the Uterine origin. Given the findings within the liver, malignant neoplasm is not excluded.  A conglomerate of matted bowel loops in the mid lower abdomen. There is no evidence of small bowel obstruction.  Electronically  Signed: By: Abelardo Diesel M.D. On: 04/06/2015 11:49    Medications: I have reviewed the patient's current medications.  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 As per my discussion with Dr. Johney Maine, and Dr. Burr Medico, I do not think this mass can be stented; plans are to do a partial colectomy tomorrow with a liver biopsy,  2) Severe iron deficiency anemia secondary to colon mass. 3) HTN. 4) Uterine fibroids vs drop mets.   LOS: 1 day   Lacey Dotson 04/07/2015, 6:41 PM

## 2015-04-07 NOTE — Progress Notes (Signed)
D/w Dr Burr Medico Tumor not amenable to stenting Plan colectomy & liver biopsy tomorrow.  Might need colostomy Will try bowel prep w oral Abx as tolerated, enema in AM Will do core liver Bx intraop - d/w Int Rad to hold off.  The anatomy & physiology of the digestive tract was discussed.  The pathophysiology of the colon was discussed.  Natural history risks without surgery was discussed.   I feel the risks of no intervention will lead to serious problems that outweigh the operative risks; therefore, I recommended a partial colectomy to remove the pathology.  Minimally invasive (Robotic/Laparoscopic) & open techniques were discussed.   Risks such as bleeding, infection, abscess, leak, reoperation, possible ostomy, hernia, heart attack, stroke, death, and other risks were discussed.  I noted a good likelihood this will help address the problem.   Goals of post-operative recovery were discussed as well.   Need for adequate nutrition, daily bowel regimen and healthy physical activity, to optimize recovery was noted as well. We will work to minimize complications.  Educational materials were available as well.  Questions were answered.  The patient expresses understanding & wishes to proceed with surgery.  Christina [name], M.D., F.A.C.S. Gastrointestinal and Minimally Invasive Surgery [facility] [address] [place], [place] [id number] [phone] Main / Paging

## 2015-04-07 NOTE — Consult Note (Signed)
WOC ostomy consult note Patient seen per request of Dr.Gross for preoperative stoma site selection. He has requested a marking for both a temporary loop ileostomy and a permanent colostomy. Patient's abdomen assessed in the sitting and standing positions.  She has a palpable, albeit narrow rectus muscle and a small subumbilical roll of adipose that will make a RLQ ileostomy a challenge.  She has a deep crease (which I have marked with a surgical marking pen) in the umbilical plane. Two sites are selected:   RUQ ileostomy mark is 5cm to the right of the umbilicus and 7.5ZW above.   LLQ colostomy mark is 6cm to the left of the umbilicus and 2cm below.  Both sites are marked with a surgical skin marking pen and are covered with a thin film transparent dressing. Patient is taught that in the event of an ostomy creation, that one of 4 WOC nurses (including myself) would be working with her to teach her about the stoma and pouching systems.  She is taught that the nurses her in the hospital are well skilled in the care of people having her surgery. She finds comfort in knowing that many of the staff members here are faithful and that they will be praying for her during the course of her stay with [place].  Patient's two friends, one of which is her pastor, are with her today and she asks them to stay for my marking and visit.  All are appreciative of the information provided.  Patient declines reading material about an ostomy or even seeing a sample pouch at this time as she states, "it might not happen". She does indicate that if she does receive an ostomy intraoperatively, she will be eager to learn and that her husband would also be a part of her "team". [place] nursing team will not follow, but will remain available to this patient, the nursing, surgical and medical teams.  Please re-consult if a stoma is created. Thanks, Maudie Flakes, MSN, RN, [place], [place], [place] [phone])

## 2015-04-08 ENCOUNTER — Encounter (HOSPITAL_COMMUNITY): Payer: Self-pay | Admitting: Certified Registered Nurse Anesthetist

## 2015-04-08 ENCOUNTER — Inpatient Hospital Stay (HOSPITAL_COMMUNITY): Payer: BLUE CROSS/BLUE SHIELD

## 2015-04-08 ENCOUNTER — Encounter (HOSPITAL_COMMUNITY)
Admission: EM | Disposition: A | Discharge status: Home / Self Care | Payer: Self-pay | Source: Home / Self Care | Attending: Internal Medicine

## 2015-04-08 ENCOUNTER — Inpatient Hospital Stay (HOSPITAL_COMMUNITY): Payer: BLUE CROSS/BLUE SHIELD | Admitting: Certified Registered Nurse Anesthetist

## 2015-04-08 DIAGNOSIS — C189 Malignant neoplasm of colon, unspecified: Secondary | ICD-10-CM | POA: Diagnosis present

## 2015-04-08 DIAGNOSIS — C787 Secondary malignant neoplasm of liver and intrahepatic bile duct: Secondary | ICD-10-CM

## 2015-04-08 HISTORY — PX: COLON RESECTION: SHX5231

## 2015-04-08 HISTORY — PX: PORTACATH PLACEMENT: SHX2246

## 2015-04-08 HISTORY — PX: LIVER BIOPSY: SHX301

## 2015-04-08 HISTORY — PX: LAPAROSCOPIC SIGMOID COLECTOMY: SHX5928

## 2015-04-08 LAB — CBC WITH DIFFERENTIAL/PLATELET
BASOS PCT: 0 % (ref 0–1)
Basophils Absolute: 0 10*3/uL (ref 0.0–0.1)
Eosinophils Absolute: 0.4 10*3/uL (ref 0.0–0.7)
Eosinophils Relative: 5 % (ref 0–5)
HEMATOCRIT: 30.2 % — AB (ref 36.0–46.0)
HEMOGLOBIN: 8.7 g/dL — AB (ref 12.0–15.0)
LYMPHS ABS: 1.5 10*3/uL (ref 0.7–4.0)
LYMPHS PCT: 20 % (ref 12–46)
MCH: 17.4 pg — AB (ref 26.0–34.0)
MCHC: 28.8 g/dL — AB (ref 30.0–36.0)
MCV: 60.3 fL — AB (ref 78.0–100.0)
MONOS PCT: 10 % (ref 3–12)
Monocytes Absolute: 0.7 10*3/uL (ref 0.1–1.0)
NEUTROS ABS: 4.7 10*3/uL (ref 1.7–7.7)
Neutrophils Relative %: 65 % (ref 43–77)
Platelets: 579 10*3/uL — ABNORMAL HIGH (ref 150–400)
RBC: 5.01 MIL/uL (ref 3.87–5.11)
RDW: 29.7 % — AB (ref 11.5–15.5)
WBC: 7.3 10*3/uL (ref 4.0–10.5)

## 2015-04-08 LAB — SURGICAL PCR SCREEN
MRSA, PCR: NEGATIVE
STAPHYLOCOCCUS AUREUS: NEGATIVE

## 2015-04-08 LAB — PROTIME-INR
INR: 1.26 (ref 0.00–1.49)
PROTHROMBIN TIME: 15.9 s — AB (ref 11.6–15.2)

## 2015-04-08 LAB — ABO/RH: ABO/RH(D): O NEG

## 2015-04-08 SURGERY — LAPAROSCOPIC SIGMOID COLON RESECTION
Anesthesia: General | Site: Chest

## 2015-04-08 MED ORDER — HEPARIN SOD (PORK) LOCK FLUSH 100 UNIT/ML IV SOLN
INTRAVENOUS | Status: DC | PRN
  Administered 2015-04-08: 500 [IU] via INTRAVENOUS

## 2015-04-08 MED ORDER — MIDAZOLAM HCL 2 MG/2ML IJ SOLN
INTRAMUSCULAR | Status: AC
Start: 2015-04-08 — End: 2015-04-08
  Filled 2015-04-08: qty 4

## 2015-04-08 MED ORDER — PROMETHAZINE HCL 25 MG/ML IJ SOLN
12.5000 mg | Freq: Once | INTRAMUSCULAR | Status: AC
  Administered 2015-04-08: 12.5 mg via INTRAVENOUS
  Filled 2015-04-08: qty 1

## 2015-04-08 MED ORDER — FENTANYL CITRATE (PF) 250 MCG/5ML IJ SOLN
INTRAMUSCULAR | Status: AC
  Filled 2015-04-08: qty 25

## 2015-04-08 MED ORDER — OXYCODONE HCL 5 MG/5ML PO SOLN
5.0000 mg | Freq: Once | ORAL | Status: DC | PRN
  Filled 2015-04-08: qty 5

## 2015-04-08 MED ORDER — MIDAZOLAM HCL 5 MG/5ML IJ SOLN
INTRAMUSCULAR | Status: DC | PRN
  Administered 2015-04-08: 2 mg via INTRAVENOUS

## 2015-04-08 MED ORDER — PROPOFOL 10 MG/ML IV BOLUS
INTRAVENOUS | Status: AC
  Filled 2015-04-08: qty 20

## 2015-04-08 MED ORDER — FENTANYL CITRATE (PF) 100 MCG/2ML IJ SOLN
INTRAMUSCULAR | Status: AC
  Filled 2015-04-08: qty 4

## 2015-04-08 MED ORDER — BUPIVACAINE-EPINEPHRINE 0.25% -1:200000 IJ SOLN
INTRAMUSCULAR | Status: AC
  Filled 2015-04-08: qty 1

## 2015-04-08 MED ORDER — EPHEDRINE SULFATE 50 MG/ML IJ SOLN
INTRAMUSCULAR | Status: AC
  Filled 2015-04-08: qty 1

## 2015-04-08 MED ORDER — DIPHENHYDRAMINE HCL 12.5 MG/5ML PO ELIX: 12.5000 mg | ORAL_SOLUTION | Freq: Four times a day (QID) | ORAL | Status: DC | PRN

## 2015-04-08 MED ORDER — FENTANYL CITRATE (PF) 100 MCG/2ML IJ SOLN
INTRAMUSCULAR | Status: DC | PRN
  Administered 2015-04-08 (×9): 50 ug via INTRAVENOUS

## 2015-04-08 MED ORDER — HYDROMORPHONE HCL 1 MG/ML IJ SOLN: 0.2500 mg | INTRAMUSCULAR | Status: DC | PRN

## 2015-04-08 MED ORDER — SACCHAROMYCES BOULARDII 250 MG PO CAPS
250.0000 mg | ORAL_CAPSULE | Freq: Two times a day (BID) | ORAL | Status: DC
  Administered 2015-04-08 – 2015-04-11 (×6): 250 mg via ORAL
  Filled 2015-04-08 (×7): qty 1

## 2015-04-08 MED ORDER — SODIUM CHLORIDE 0.9 % IV SOLN: 250.0000 mL | INTRAVENOUS | Status: DC | PRN

## 2015-04-08 MED ORDER — 0.9 % SODIUM CHLORIDE (POUR BTL) OPTIME
TOPICAL | Status: DC | PRN
  Administered 2015-04-08: 2000 mL

## 2015-04-08 MED ORDER — PROPOFOL 10 MG/ML IV BOLUS
INTRAVENOUS | Status: DC | PRN
  Administered 2015-04-08: 150 mg via INTRAVENOUS

## 2015-04-08 MED ORDER — DEXAMETHASONE SODIUM PHOSPHATE 10 MG/ML IJ SOLN
INTRAMUSCULAR | Status: DC | PRN
  Administered 2015-04-08: 10 mg via INTRAVENOUS

## 2015-04-08 MED ORDER — HEPARIN SODIUM (PORCINE) 5000 UNIT/ML IJ SOLN
INTRAMUSCULAR | Status: AC
  Filled 2015-04-08: qty 1

## 2015-04-08 MED ORDER — DEXTROSE 5 % IV SOLN
2.0000 g | Freq: Two times a day (BID) | INTRAVENOUS | Status: AC
  Administered 2015-04-09: 2 g via INTRAVENOUS
  Filled 2015-04-08: qty 2

## 2015-04-08 MED ORDER — BUPIVACAINE-EPINEPHRINE (PF) 0.25% -1:200000 IJ SOLN
INTRAMUSCULAR | Status: AC
  Filled 2015-04-08: qty 30

## 2015-04-08 MED ORDER — GLYCOPYRROLATE 0.2 MG/ML IJ SOLN
INTRAMUSCULAR | Status: AC
  Filled 2015-04-08: qty 3

## 2015-04-08 MED ORDER — ONDANSETRON HCL 4 MG/2ML IJ SOLN
INTRAMUSCULAR | Status: DC | PRN
  Administered 2015-04-08: 4 mg via INTRAVENOUS

## 2015-04-08 MED ORDER — HYDROMORPHONE HCL 1 MG/ML IJ SOLN
INTRAMUSCULAR | Status: DC | PRN
  Administered 2015-04-08: 1 mg via INTRAVENOUS
  Administered 2015-04-08 (×2): .5 mg via INTRAVENOUS

## 2015-04-08 MED ORDER — PHENYLEPHRINE HCL 10 MG/ML IJ SOLN
INTRAMUSCULAR | Status: DC | PRN
  Administered 2015-04-08: 40 ug via INTRAVENOUS

## 2015-04-08 MED ORDER — ONDANSETRON HCL 4 MG/2ML IJ SOLN
INTRAMUSCULAR | Status: AC
  Filled 2015-04-08: qty 2

## 2015-04-08 MED ORDER — PROMETHAZINE HCL 25 MG/ML IJ SOLN: 6.2500 mg | INTRAMUSCULAR | Status: DC | PRN

## 2015-04-08 MED ORDER — HYDROMORPHONE HCL 1 MG/ML IJ SOLN
0.5000 mg | INTRAMUSCULAR | Status: DC | PRN
  Administered 2015-04-08 – 2015-04-09 (×3): 1 mg via INTRAVENOUS
  Administered 2015-04-09: 0.5 mg via INTRAVENOUS
  Administered 2015-04-09 (×4): 1 mg via INTRAVENOUS
  Administered 2015-04-09: 0.5 mg via INTRAVENOUS
  Administered 2015-04-10 (×2): 1 mg via INTRAVENOUS
  Filled 2015-04-08 (×10): qty 1
  Filled 2015-04-08: qty 2

## 2015-04-08 MED ORDER — SODIUM CHLORIDE 0.9 % IV SOLN
INTRAVENOUS | Status: DC | PRN
  Administered 2015-04-08: 70 mL

## 2015-04-08 MED ORDER — ACETAMINOPHEN 500 MG PO TABS
1000.0000 mg | ORAL_TABLET | Freq: Three times a day (TID) | ORAL | Status: DC
  Administered 2015-04-08 – 2015-04-11 (×8): 1000 mg via ORAL
  Filled 2015-04-08 (×10): qty 2

## 2015-04-08 MED ORDER — BACITRACIN ZINC 500 UNIT/GM EX OINT
TOPICAL_OINTMENT | CUTANEOUS | Status: AC
  Filled 2015-04-08: qty 28.35

## 2015-04-08 MED ORDER — ROCURONIUM BROMIDE 100 MG/10ML IV SOLN
INTRAVENOUS | Status: AC
  Filled 2015-04-08: qty 1

## 2015-04-08 MED ORDER — LACTATED RINGERS IV SOLN
INTRAVENOUS | Status: DC
  Administered 2015-04-08: 1000 mL via INTRAVENOUS

## 2015-04-08 MED ORDER — NEOSTIGMINE METHYLSULFATE 10 MG/10ML IV SOLN
INTRAVENOUS | Status: AC
  Filled 2015-04-08: qty 1

## 2015-04-08 MED ORDER — CEFOTETAN DISODIUM-DEXTROSE 2-2.08 GM-% IV SOLR
INTRAVENOUS | Status: AC
  Filled 2015-04-08: qty 50

## 2015-04-08 MED ORDER — OXYCODONE HCL 5 MG PO TABS: 5.0000 mg | ORAL_TABLET | Freq: Once | ORAL | Status: DC | PRN

## 2015-04-08 MED ORDER — SODIUM CHLORIDE 0.9 % IJ SOLN
INTRAMUSCULAR | Status: AC
  Filled 2015-04-08: qty 50

## 2015-04-08 MED ORDER — SUGAMMADEX SODIUM 200 MG/2ML IV SOLN
INTRAVENOUS | Status: AC
  Filled 2015-04-08: qty 2

## 2015-04-08 MED ORDER — LACTATED RINGERS IV SOLN
INTRAVENOUS | Status: DC | PRN
  Administered 2015-04-08 (×2): via INTRAVENOUS

## 2015-04-08 MED ORDER — ENOXAPARIN SODIUM 40 MG/0.4ML ~~LOC~~ SOLN
40.0000 mg | SUBCUTANEOUS | Status: DC
  Administered 2015-04-09 – 2015-04-11 (×3): 40 mg via SUBCUTANEOUS
  Filled 2015-04-08 (×4): qty 0.4

## 2015-04-08 MED ORDER — SODIUM CHLORIDE 0.9 % IJ SOLN: 3.0000 mL | Freq: Two times a day (BID) | INTRAMUSCULAR | Status: DC

## 2015-04-08 MED ORDER — SUCCINYLCHOLINE CHLORIDE 20 MG/ML IJ SOLN
INTRAMUSCULAR | Status: DC | PRN
  Administered 2015-04-08: 100 mg via INTRAVENOUS

## 2015-04-08 MED ORDER — SUGAMMADEX SODIUM 200 MG/2ML IV SOLN
INTRAVENOUS | Status: DC | PRN
  Administered 2015-04-08: 150 mg via INTRAVENOUS

## 2015-04-08 MED ORDER — ALVIMOPAN 12 MG PO CAPS
12.0000 mg | ORAL_CAPSULE | Freq: Two times a day (BID) | ORAL | Status: DC
  Filled 2015-04-08 (×4): qty 1

## 2015-04-08 MED ORDER — LABETALOL HCL 5 MG/ML IV SOLN
INTRAVENOUS | Status: AC
  Administered 2015-04-08: 5 mg
  Filled 2015-04-08: qty 4

## 2015-04-08 MED ORDER — SODIUM CHLORIDE 0.9 % IJ SOLN: 3.0000 mL | INTRAMUSCULAR | Status: DC | PRN

## 2015-04-08 MED ORDER — ROCURONIUM BROMIDE 100 MG/10ML IV SOLN
INTRAVENOUS | Status: DC | PRN
  Administered 2015-04-08 (×2): 10 mg via INTRAVENOUS
  Administered 2015-04-08: 40 mg via INTRAVENOUS
  Administered 2015-04-08: 10 mg via INTRAVENOUS

## 2015-04-08 MED ORDER — LIDOCAINE HCL (CARDIAC) 20 MG/ML IV SOLN
INTRAVENOUS | Status: DC | PRN
  Administered 2015-04-08: 80 mg via INTRAVENOUS

## 2015-04-08 MED ORDER — HEPARIN SOD (PORK) LOCK FLUSH 100 UNIT/ML IV SOLN
INTRAVENOUS | Status: AC
  Filled 2015-04-08: qty 5

## 2015-04-08 MED ORDER — BUPIVACAINE-EPINEPHRINE 0.25% -1:200000 IJ SOLN
INTRAMUSCULAR | Status: DC | PRN
Start: 2015-04-08 — End: 2015-04-08
  Administered 2015-04-08: 30 mL

## 2015-04-08 MED ORDER — LIDOCAINE HCL (CARDIAC) 20 MG/ML IV SOLN
INTRAVENOUS | Status: AC
  Filled 2015-04-08: qty 5

## 2015-04-08 MED ORDER — DIPHENHYDRAMINE HCL 50 MG/ML IJ SOLN: 12.5000 mg | Freq: Four times a day (QID) | INTRAMUSCULAR | Status: DC | PRN

## 2015-04-08 MED ORDER — LACTATED RINGERS IR SOLN
Status: DC | PRN
  Administered 2015-04-08: 1000 mL

## 2015-04-08 MED ORDER — HYDROMORPHONE HCL 2 MG/ML IJ SOLN
INTRAMUSCULAR | Status: AC
  Filled 2015-04-08: qty 1

## 2015-04-08 MED ORDER — OXYCODONE HCL 5 MG PO TABS: 5.0000 mg | ORAL_TABLET | ORAL | Status: DC | PRN

## 2015-04-08 MED ORDER — PHENYLEPHRINE 40 MCG/ML (10ML) SYRINGE FOR IV PUSH (FOR BLOOD PRESSURE SUPPORT)
PREFILLED_SYRINGE | INTRAVENOUS | Status: AC
  Filled 2015-04-08: qty 10

## 2015-04-08 MED ORDER — LACTATED RINGERS IV SOLN
INTRAVENOUS | Status: DC | PRN
  Administered 2015-04-08: 13:00:00 via INTRAVENOUS

## 2015-04-08 MED ORDER — SODIUM CHLORIDE 0.9 % IR SOLN
Freq: Once | Status: AC
  Administered 2015-04-08: 10 mL
  Filled 2015-04-08: qty 1.2

## 2015-04-08 MED ORDER — GENTAMICIN SULFATE 40 MG/ML IJ SOLN
INTRAMUSCULAR | Status: DC | PRN
  Administered 2015-04-08: 16:00:00 via INTRAPERITONEAL

## 2015-04-08 MED ORDER — SODIUM CHLORIDE 0.9 % IJ SOLN
INTRAMUSCULAR | Status: AC
  Filled 2015-04-08: qty 10

## 2015-04-08 SURGICAL SUPPLY — 92 items
APPLIER CLIP 5 13 M/L LIGAMAX5 (MISCELLANEOUS)
APPLIER CLIP ROT 10 11.4 M/L (STAPLE)
BAG DECANTER FOR FLEXI CONT (MISCELLANEOUS) ×4 IMPLANT
BLADE EXTENDED COATED 6.5IN (ELECTRODE) IMPLANT
BLADE HEX COATED 2.75 (ELECTRODE) IMPLANT
BLADE SURG SZ11 CARB STEEL (BLADE) ×4 IMPLANT
CABLE HIGH FREQUENCY MONO STRZ (ELECTRODE) IMPLANT
CATH KIT ON-Q SILVERSOAK 7.5IN (CATHETERS) IMPLANT
CELLS DAT CNTRL 66122 CELL SVR (MISCELLANEOUS) IMPLANT
CHLORAPREP W/TINT 26ML (MISCELLANEOUS) ×4 IMPLANT
CLIP APPLIE 5 13 M/L LIGAMAX5 (MISCELLANEOUS) IMPLANT
CLIP APPLIE ROT 10 11.4 M/L (STAPLE) IMPLANT
COVER MAYO STAND STRL (DRAPES) ×4 IMPLANT
COVER PROBE U/S 5X48 (MISCELLANEOUS) ×4 IMPLANT
COVER PROBE W GEL 5X96 (DRAPES) ×4 IMPLANT
COVER SURGICAL LIGHT HANDLE (MISCELLANEOUS) ×4 IMPLANT
DECANTER SPIKE VIAL GLASS SM (MISCELLANEOUS) ×4 IMPLANT
DRAIN CHANNEL 19F RND (DRAIN) IMPLANT
DRAPE C-ARM 42X120 X-RAY (DRAPES) ×4 IMPLANT
DRAPE LAPAROSCOPIC ABDOMINAL (DRAPES) ×4 IMPLANT
DRAPE LAPAROTOMY T 98X78 PEDS (DRAPES) ×4 IMPLANT
DRAPE PED LAPAROTOMY (DRAPES) ×4 IMPLANT
DRAPE SHEET LG 3/4 BI-LAMINATE (DRAPES) ×4 IMPLANT
DRAPE UTILITY XL STRL (DRAPES) ×4 IMPLANT
DRAPE WARM FLUID 44X44 (DRAPE) ×4 IMPLANT
DRSG OPSITE POSTOP 4X10 (GAUZE/BANDAGES/DRESSINGS) IMPLANT
DRSG OPSITE POSTOP 4X6 (GAUZE/BANDAGES/DRESSINGS) ×4 IMPLANT
DRSG OPSITE POSTOP 4X8 (GAUZE/BANDAGES/DRESSINGS) IMPLANT
DRSG TEGADERM 2-3/8X2-3/4 SM (GAUZE/BANDAGES/DRESSINGS) ×8 IMPLANT
DRSG TEGADERM 4X4.75 (GAUZE/BANDAGES/DRESSINGS) IMPLANT
DRSG TELFA 3X8 NADH (GAUZE/BANDAGES/DRESSINGS) ×4 IMPLANT
ELECT PENCIL ROCKER SW 15FT (MISCELLANEOUS) ×8 IMPLANT
ELECT REM PT RETURN 15FT ADLT (MISCELLANEOUS) ×4 IMPLANT
ELECT REM PT RETURN 9FT ADLT (ELECTROSURGICAL) ×4
ELECTRODE REM PT RTRN 9FT ADLT (ELECTROSURGICAL) ×2 IMPLANT
ENDOLOOP SUT PDS II  0 18 (SUTURE)
ENDOLOOP SUT PDS II 0 18 (SUTURE) IMPLANT
EVACUATOR SILICONE 100CC (DRAIN) IMPLANT
GAUZE SPONGE 2X2 8PLY STRL LF (GAUZE/BANDAGES/DRESSINGS) ×4 IMPLANT
GAUZE SPONGE 4X4 12PLY STRL (GAUZE/BANDAGES/DRESSINGS) ×4 IMPLANT
GAUZE SPONGE 4X4 16PLY XRAY LF (GAUZE/BANDAGES/DRESSINGS) ×4 IMPLANT
GLOVE ECLIPSE 8.0 STRL XLNG CF (GLOVE) ×8 IMPLANT
GLOVE INDICATOR 8.0 STRL GRN (GLOVE) ×8 IMPLANT
GOWN STRL REUS W/TWL XL LVL3 (GOWN DISPOSABLE) ×16 IMPLANT
KIT BASIN OR (CUSTOM PROCEDURE TRAY) ×4 IMPLANT
KIT PORT POWER 8FR ISP CVUE (Catheter) ×4 IMPLANT
LEGGING LITHOTOMY PAIR STRL (DRAPES) ×4 IMPLANT
LUBRICANT JELLY K Y 4OZ (MISCELLANEOUS) IMPLANT
NEEDLE BIOPSY 14X6 SOFT TISS (NEEDLE) ×4 IMPLANT
NEEDLE HYPO 25X1 1.5 SAFETY (NEEDLE) ×4 IMPLANT
PACK BASIC VI WITH GOWN DISP (CUSTOM PROCEDURE TRAY) ×4 IMPLANT
PACK GENERAL/GYN (CUSTOM PROCEDURE TRAY) ×4 IMPLANT
PORT LAP GEL ALEXIS MED 5-9CM (MISCELLANEOUS) IMPLANT
RTRCTR WOUND ALEXIS 18CM MED (MISCELLANEOUS)
SCISSORS LAP 5X35 DISP (ENDOMECHANICALS) ×4 IMPLANT
SEALER TISSUE G2 STRG ARTC 35C (ENDOMECHANICALS) ×4 IMPLANT
SET IRRIG TUBING LAPAROSCOPIC (IRRIGATION / IRRIGATOR) ×4 IMPLANT
SLEEVE XCEL OPT CAN 5 100 (ENDOMECHANICALS) ×8 IMPLANT
SPONGE GAUZE 2X2 STER 10/PKG (GAUZE/BANDAGES/DRESSINGS) ×4
STAPLER CIRC ILS CVD 33MM 37CM (STAPLE) ×4 IMPLANT
STAPLER CUT CVD 40MM BLUE (STAPLE) ×4 IMPLANT
STAPLER VISISTAT 35W (STAPLE) ×4 IMPLANT
SUT ETHILON 2 0 PS N (SUTURE) IMPLANT
SUT MNCRL AB 4-0 PS2 18 (SUTURE) ×8 IMPLANT
SUT PDS AB 1 CTX 36 (SUTURE) IMPLANT
SUT PDS AB 1 TP1 96 (SUTURE) ×8 IMPLANT
SUT PROLENE 0 CT 2 (SUTURE) IMPLANT
SUT PROLENE 2 0 SH DA (SUTURE) ×8 IMPLANT
SUT SILK 2 0 (SUTURE) ×2
SUT SILK 2 0 SH CR/8 (SUTURE) ×4 IMPLANT
SUT SILK 2-0 18XBRD TIE 12 (SUTURE) ×2 IMPLANT
SUT SILK 3 0 (SUTURE) ×2
SUT SILK 3 0 SH CR/8 (SUTURE) ×4 IMPLANT
SUT SILK 3-0 18XBRD TIE 12 (SUTURE) ×2 IMPLANT
SUT VICRYL 0 UR6 27IN ABS (SUTURE) ×4 IMPLANT
SYR 20CC LL (SYRINGE) ×4 IMPLANT
SYRINGE 10CC LL (SYRINGE) ×4 IMPLANT
SYS LAPSCP GELPORT 120MM (MISCELLANEOUS)
SYSTEM LAPSCP GELPORT 120MM (MISCELLANEOUS) IMPLANT
TAPE UMBILICAL COTTON 1/8X30 (MISCELLANEOUS) ×4 IMPLANT
TOWEL OR 17X26 10 PK STRL BLUE (TOWEL DISPOSABLE) ×4 IMPLANT
TOWEL OR NON WOVEN STRL DISP B (DISPOSABLE) ×4 IMPLANT
TRAY FOLEY W/METER SILVER 14FR (SET/KITS/TRAYS/PACK) ×4 IMPLANT
TRAY FOLEY W/METER SILVER 16FR (SET/KITS/TRAYS/PACK) IMPLANT
TRAY LAPAROSCOPIC (CUSTOM PROCEDURE TRAY) ×4 IMPLANT
TROCAR BLADELESS OPT 5 100 (ENDOMECHANICALS) ×4 IMPLANT
TROCAR XCEL NON-BLD 11X100MML (ENDOMECHANICALS) IMPLANT
TUBING CONNECTING 10 (TUBING) IMPLANT
TUBING CONNECTING 10' (TUBING)
TUBING FILTER THERMOFLATOR (ELECTROSURGICAL) ×4 IMPLANT
TUNNELER SHEATH ON-Q 16GX12 DP (PAIN MANAGEMENT) IMPLANT
YANKAUER SUCT BULB TIP 10FT TU (MISCELLANEOUS) ×4 IMPLANT

## 2015-04-08 NOTE — Progress Notes (Signed)
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  Pathology pending  Large left lateral liver and a few small right liver masses strongly suspicious for metastatic colorectal cancer.  Large pelvic mass adherent to left upper uterus strongly suspicious for benign fibroid.  Plan:  Lap colectomy, liver biopsy, port-a-catheter  The anatomy & physiology of the digestive tract was discussed.  The pathophysiology of the colon was discussed.  Natural history risks without surgery was discussed.   I feel the risks of no intervention will lead to serious problems that outweigh the operative risks; therefore, I recommended a partial colectomy to remove the pathology.  Minimally invasive (Robotic/Laparoscopic) & open techniques were discussed.   Risks such as bleeding, infection, abscess, leak, reoperation, possible ostomy, hernia, heart attack, stroke, death, and other risks were discussed.  I noted a good likelihood this will help address the problem.   Goals of post-operative recovery were discussed as well.   Need for adequate nutrition, daily bowel regimen and healthy physical activity, to optimize recovery was noted as well. We will work to minimize complications.  Educational materials were available as well.  Questions were answered.  The patient expresses understanding & wishes to proceed with surgery.  Use of a central venous catheter for intravenous therapy was discussed.   Technique of catheter placement using ultrasound and fluoroscopy guidance was discussed.  Risks such as bleeding, infection, pneumothorax, catheter occlusion, reoperation, and other risks were discussed.   I noted a good likelihood this will help address the problem.  Questions were answered.  The patient expressed understanding & wishes to proceed.   I updated the patient's status to the patient.  Recommendations were made.  Questions were answered.  She is amenable to surgery or stenting or chemotherapy as needed.  The patient expressed understanding & appreciation.   [name], M.D., F.A.C.S. Gastrointestinal and Minimally Invasive Surgery [facility] [address] [place], [place] [address] [phone] Main / Paging   04/08/2015  Subjective:  No new events  Patient with some left lower quadrant discomfort but no vomiting.  Some nausea.  Tolerating some sips of clears.  Objective:  Vital signs:  Filed Vitals:   04/07/15 1350 04/07/15 2103 04/08/15 0523 04/08/15 1037  BP: 157/79 138/70 141/67 148/70  Pulse: 77 70 73 73  Temp: 98 F (36.7 C) 98.7 F (37.1 C) 98.3 F (36.8 C) 98.2 F (36.8 C)  TempSrc: Oral Oral Oral Oral  Resp: 18 18 18 20   Height:      Weight:      SpO2: 100% 98% 99% 100%    Last BM Date: 04/08/15  Intake/Output   Yesterday:  08/29 0701 - 08/30 0700 In: 1159.3 [P.O.:760; I.V.:399.3] Out: -  This shift:  Total I/O In: -  Out: 2 [Urine:1; Stool:1]  Bowel function:  Flatus: y  BM: small  Drain: n/a  Physical Exam:  General: Pt awake/alert/oriented x4 in no acute  distress.  Calm Eyes: PERRL, normal EOM.  Sclera clear.  No icterus Neuro: CN II-XII intact w/o focal sensory/motor deficits. Lymph: No head/neck/groin lymphadenopathy Psych:  No delerium/psychosis/paranoia HENT: Normocephalic, Mucus membranes moist.  No thrush Neck: Supple, No tracheal deviation Chest: No chest wall pain w good  excursion CV:  Pulses intact.  Regular rhythm MS: Normal AROM mjr joints.  No obvious deformity Abdomen: Soft.  Nondistended.  Minimally tender at LLQ only.  No evidence of peritonitis.  No incarcerated hernias. Ext:  SCDs BLE.  No mjr edema.  No cyanosis Skin: No petechiae / purpura  Results:   Labs: Results for orders placed or performed during the hospital encounter of 04/06/15 (from the past 48 hour(s))  CEA     Status: Abnormal   Collection Time: 04/06/15  2:32 PM  Result Value Ref Range   CEA 467.3 (H) 0.0 - 4.7 ng/mL    Comment: (NOTE)       Roche ECLIA methodology       Nonsmokers  <3.9                                     Smokers     <5.6 Performed At: [facility] [place], [place] [id number] Lindon Romp MD [id number]   CA 125     Status: None   Collection Time: 04/06/15  2:32 PM  Result Value Ref Range   CA 125 11.9 0.0 - 38.1 U/mL    Comment: (NOTE) Roche ECLIA methodology Performed At: [facility] [place], [place] [id number] Lindon Romp MD [id number]   Cancer antigen 19-9     Status: Abnormal   Collection Time: 04/06/15  2:32 PM  Result Value Ref Range   CA 19-9 1605 (H) 0 - 35 U/mL    Comment: (NOTE) Results confirmed on dilution. Roche ECLIA methodology Performed At: [facility] [place], [place] [id number] Lindon Romp MD [id number]   Vitamin B12     Status: None   Collection Time: 04/06/15  2:32 PM  Result Value Ref Range   Vitamin B-12 636 180 - 914 pg/mL    Comment: (NOTE) This assay is not validated for testing neonatal or myeloproliferative syndrome specimens for Vitamin B12 levels. Performed at [facility]   Folate     Status: None   Collection Time: 04/06/15  2:32 PM  Result Value Ref Range   Folate 32.7 >5.9 ng/mL    Comment: Performed at [facility]  Iron and TIBC     Status: Abnormal   Collection Time: 04/06/15  2:32 PM   Result Value Ref Range   Iron 17 (L) 28 - 170 ug/dL   TIBC 406 250 - 450 ug/dL   Saturation Ratios 4 (L) 10.4 - 31.8 %   UIBC 389 ug/dL    Comment: Performed at [facility]  Ferritin     Status: Abnormal   Collection Time: 04/06/15  2:32 PM  Result Value Ref Range   Ferritin 8 (L) 11 - 307 ng/mL    Comment: Performed at [facility]  Reticulocytes     Status: Abnormal   Collection Time: 04/06/15  2:32 PM  Result Value Ref Range   Retic Ct Pct RESULTS UNAVAILABLE DUE TO INTERFERING SUBSTANCE 0.4 - 3.1 %   RBC. 5.49 (H) 3.87 - 5.11 MIL/uL   Retic  Count, Manual NOT CALCULATED 19.0 - 186.0 K/uL  Ammonia     Status: None   Collection Time: 04/06/15  2:32 PM  Result Value Ref Range   Ammonia 17 9 - 35 umol/L  Basic metabolic panel     Status: Abnormal   Collection Time: 04/07/15  5:37 AM  Result Value Ref Range   Sodium 143 135 - 145 mmol/L   Potassium 3.3 (L) 3.5 - 5.1 mmol/L   Chloride 107 101 - 111 mmol/L   CO2 28 22 - 32 mmol/L   Glucose, Bld 91 65 - 99 mg/dL   BUN <5 (L) 6 - 20 mg/dL   Creatinine, Ser 0.80 0.44 - 1.00 mg/dL   Calcium 9.1 8.9 - 10.3 mg/dL   GFR calc non Af Amer >60 >60 mL/min   GFR calc Af Amer >60 >60 mL/min    Comment: (NOTE) The eGFR has been calculated using the CKD EPI equation. This calculation has not been validated in all clinical situations. eGFR's persistently <60 mL/min signify possible Chronic Kidney Disease.    Anion gap 8 5 - 15  CBC     Status: Abnormal   Collection Time: 04/07/15  5:37 AM  Result Value Ref Range   WBC 7.2 4.0 - 10.5 K/uL   RBC 5.13 (H) 3.87 - 5.11 MIL/uL   Hemoglobin 8.9 (L) 12.0 - 15.0 g/dL   HCT 30.9 (L) 36.0 - 46.0 %   MCV 60.2 (L) 78.0 - 100.0 fL   MCH 17.3 (L) 26.0 - 34.0 pg   MCHC 28.8 (L) 30.0 - 36.0 g/dL   RDW 29.4 (H) 11.5 - 15.5 %   Platelets 597 (H) 150 - 400 K/uL  Protime-INR     Status: None   Collection Time: 04/07/15 11:00 AM  Result Value Ref Range   Prothrombin Time 15.1 11.6 -  15.2 seconds   INR 1.17 0.00 - 1.49  APTT     Status: None   Collection Time: 04/07/15 11:00 AM  Result Value Ref Range   aPTT 32 24 - 37 seconds  Surgical pcr screen     Status: None   Collection Time: 04/08/15 12:57 AM  Result Value Ref Range   MRSA, PCR NEGATIVE NEGATIVE   Staphylococcus aureus NEGATIVE NEGATIVE    Comment:        The Xpert SA Assay (FDA approved for NASAL specimens in patients over 42 years of age), is one component of a comprehensive surveillance program.  Test performance has been validated by [facility] for patients greater than or equal to 19 year old. It is not intended to diagnose infection nor to guide or monitor treatment.   Protime-INR     Status: Abnormal   Collection Time: 04/08/15  5:53 AM  Result Value Ref Range   Prothrombin Time 15.9 (H) 11.6 - 15.2 seconds   INR 1.26 0.00 - 1.49  CBC WITH DIFFERENTIAL     Status: Abnormal   Collection Time: 04/08/15  5:53 AM  Result Value Ref Range   WBC 7.3 4.0 - 10.5 K/uL   RBC 5.01 3.87 - 5.11 MIL/uL   Hemoglobin 8.7 (L) 12.0 - 15.0 g/dL   HCT 30.2 (L) 36.0 - 46.0 %   MCV 60.3 (L) 78.0 - 100.0 fL   MCH 17.4 (L) 26.0 - 34.0 pg   MCHC 28.8 (L) 30.0 - 36.0 g/dL   RDW 29.7 (H) 11.5 - 15.5 %   Platelets 579 (H) 150 - 400 K/uL  Comment: LARGE PLATELETS PRESENT PLATELET COUNT CONFIRMED BY SMEAR    Neutrophils Relative % 65 43 - 77 %   Lymphocytes Relative 20 12 - 46 %   Monocytes Relative 10 3 - 12 %   Eosinophils Relative 5 0 - 5 %   Basophils Relative 0 0 - 1 %   Neutro Abs 4.7 1.7 - 7.7 K/uL   Lymphs Abs 1.5 0.7 - 4.0 K/uL   Monocytes Absolute 0.7 0.1 - 1.0 K/uL   Eosinophils Absolute 0.4 0.0 - 0.7 K/uL   Basophils Absolute 0.0 0.0 - 0.1 K/uL   RBC Morphology POLYCHROMASIA PRESENT     Comment: TARGET CELLS TEARDROP CELLS   Type and screen     Status: None (Preliminary result)   Collection Time: 04/08/15 11:18 AM  Result Value Ref Range   ABO/RH(D) O NEG    Antibody Screen PENDING     Sample Expiration 04/11/2015   ABO/Rh     Status: None   Collection Time: 04/08/15 11:24 AM  Result Value Ref Range   ABO/RH(D) O NEG     Imaging / Studies: US Transvaginal Non-ob  24-Apr-2015   CLINICAL DATA:  Followup CT to evaluate possible uterine masses. History of uterine fibroids, endometriosis.  EXAM: TRANSABDOMINAL AND TRANSVAGINAL ULTRASOUND OF PELVIS  TECHNIQUE: Both transabdominal and transvaginal ultrasound examinations of the pelvis were performed. Transabdominal technique was performed for global imaging of the pelvis including uterus, ovaries, adnexal regions, and pelvic cul-de-sac. It was necessary to proceed with endovaginal exam following the transabdominal exam to visualize the endometrium and ovaries.  COMPARISON:  CT today.  FINDINGS: Uterus  Measurements: 6.3 x 6.3 x 11.2 cm. There is at least 1 fibroid along the right side of the uterine body measuring 2.2 x 2.4 x 2.8 cm. Large mass seen on CT abutting the right-sided the lower uterus is not well demonstrated by ultrasound transabdominally nor endovaginally due to moderate adnexal bowel gas.  Endometrium  Thickness: 4 mm.  No focal abnormality visualized.  Right ovary  Measurements: Not visualized.  Left ovary  Measurements: Not visualized.  Other findings  No free fluid.  IMPRESSION: Mildly enlarged uterus with at least 1 fibroid over the right side of the uterine body measuring 2.8 cm. The large 7 cm mass seen on CT which appears to be arising from the right side of the lower uterine segment is not well demonstrated sonographically.  Nonvisualization of the ovaries.  No free fluid.   Electronically Signed   By: Marin Olp M.D.   On: 2015/04/24 21:05   US Pelvis Complete  2015/04/24   CLINICAL DATA:  Followup CT to evaluate possible uterine masses. History of uterine fibroids, endometriosis.  EXAM: TRANSABDOMINAL AND TRANSVAGINAL ULTRASOUND OF PELVIS  TECHNIQUE: Both transabdominal and transvaginal ultrasound examinations of  the pelvis were performed. Transabdominal technique was performed for global imaging of the pelvis including uterus, ovaries, adnexal regions, and pelvic cul-de-sac. It was necessary to proceed with endovaginal exam following the transabdominal exam to visualize the endometrium and ovaries.  COMPARISON:  CT today.  FINDINGS: Uterus  Measurements: 6.3 x 6.3 x 11.2 cm. There is at least 1 fibroid along the right side of the uterine body measuring 2.2 x 2.4 x 2.8 cm. Large mass seen on CT abutting the right-sided the lower uterus is not well demonstrated by ultrasound transabdominally nor endovaginally due to moderate adnexal bowel gas.  Endometrium  Thickness: 4 mm.  No focal abnormality visualized.  Right ovary  Measurements:  Not visualized.  Left ovary  Measurements: Not visualized.  Other findings  No free fluid.  IMPRESSION: Mildly enlarged uterus with at least 1 fibroid over the right side of the uterine body measuring 2.8 cm. The large 7 cm mass seen on CT which appears to be arising from the right side of the lower uterine segment is not well demonstrated sonographically.  Nonvisualization of the ovaries.  No free fluid.   Electronically Signed   By: Marin Olp M.D.   On: 04/06/2015 21:05    Medications / Allergies: per chart  Antibiotics: Anti-infectives    Start     Dose/Rate Route Frequency Ordered Stop   04/08/15 0800  clindamycin (CLEOCIN) 900 mg, gentamicin (GARAMYCIN) 240 mg in sodium chloride 0.9 % 1,000 mL for intraperitoneal lavage    Comments:  Pharmacy may adjust dosing strength, schedule, rate of infusion, etc as needed to optimize therapy    Intraperitoneal To Surgery 04/07/15 1128 04/09/15 0800   04/08/15 0600  cefoTEtan (CEFOTAN) 2 g in dextrose 5 % 50 mL IVPB     2 g 100 mL/hr over 30 Minutes Intravenous On call to O.R. 04/07/15 1128 04/09/15 0559   04/07/15 1300  neomycin (MYCIFRADIN) tablet 1,000 mg     1,000 mg Oral 3 times per day on Mon 04/07/15 1128 04/07/15 2208    04/07/15 1300  metroNIDAZOLE (FLAGYL) tablet 1,000 mg    Comments:  Take 2 pills (=1058m) by mouth at 1pm, 3pm, and 10pm the day before your colorectal operation   1,000 mg Oral 3 times per day on Mon 04/07/15 1128 04/07/15 2208        Note: Portions of this report may have been transcribed using voice recognition software. Every effort was made to ensure accuracy; however, inadvertent computerized transcription errors may be present.   Any transcriptional errors that result from this process are unintentional.     SAdin Hector M.D., F.A.C.S. Gastrointestinal and Minimally Invasive Surgery [facility] [address] [place] [phone] / Paging   04/08/2015  CARE TEAM:  PCP: OBenito Mccreedy MD  Outpatient Care Team: Patient Care Team: GBenito Mccreedy MD as PCP - General (Internal Medicine) JJuanita Craver MD as Consulting Physician (Gastroenterology) SMichael Boston MD as Consulting Physician (General Surgery)  Inpatient Treatment Team: Treatment Team: Attending Provider: RMendel Corning MD; Consulting Physician: MNolon Nations MD; Consulting Physician: JJuanita Craver MD; Technician: SGuadalupe Maple [place] Consulting Physician: YTruitt Merle MD; Registered Nurse: PRoe Rutherford RN; Rounding Team: WThreasa Beards MD; Technician: CMertha Baars NT; Registered Nurse: KLavell Luster RN; Registered Nurse: IBertrum Sol RN; Technician: KCarlisle Beers NT

## 2015-04-08 NOTE — Anesthesia Procedure Notes (Signed)
Procedure Name: Intubation Date/Time: 04/08/2015 1:25 PM Performed by: Montel Clock Pre-anesthesia Checklist: Patient identified, Emergency Drugs available, Suction available, Patient being monitored and Timeout performed Patient Re-evaluated:Patient Re-evaluated prior to inductionOxygen Delivery Method: Circle system utilized Preoxygenation: Pre-oxygenation with 100% oxygen Intubation Type: IV induction, Rapid sequence and Cricoid Pressure applied Laryngoscope Size: Mac and 3 Grade View: Grade II Tube type: Oral Tube size: 7.5 mm Number of attempts: 1 Airway Equipment and Method: Stylet Placement Confirmation: ETT inserted through vocal cords under direct vision,  positive ETCO2 and breath sounds checked- equal and bilateral Secured at: 21 cm Tube secured with: Tape Dental Injury: Teeth and Oropharynx as per pre-operative assessment

## 2015-04-08 NOTE — Progress Notes (Signed)
[facility]                                                                              Patient Demographics  Christina [name], is a 54 y.o. female, DOB - 04/19/1960, EVO:[id number]  Admit date - 04/06/2015   Admitting Physician Ripudeep Krystal Eaton, MD  Outpatient Primary MD for the patient is [name],[name], MD  LOS - 2   Chief Complaint  Patient presents with  . Emesis       Brief HPI    Patient is a 54 year old female with hypertension, endometriosis, chronic anemia presented to ED with complaints of intractable nausea and vomiting that started last night. History was obtained from the patient and her husband in the room. Patient reported that she has been having rectal bleeding off and on for last 3 months. Patient was seen by gastroenterology, Dr. Collene Mares, underwent colonoscopy on 8/26 and Dr. Collene Mares was unable to pass the scope through the large nearly obstructing mass. Dr. Collene Mares had contacted surgery, biopsies were obtained of the mass, patient was scheduled to have CT scan of the abdomen on 8/29. However overnight patient started having several episodes of nausea and vomiting and presented to [facility] after the recommendations from Dr. Carlean Purl for further workup. At the time of my examination, patient complains of left lower quadrant abdominal pain, 6/10, not radiating, associated with nausea. Currently no vomiting, fevers or chills. Patient reports that she had loss of appetite in the last 1 week and may have lost about 5 pounds. She has a history of colon cancer in one of her cousins, otherwise not in her immediate family and siblings. ED workup showed sodium of 139, potassium 3.2, creatinine 0.9 every BC 7.7 hemoglobin 9.6, platelets 735. Per Dr. Collene Mares, patient had received 2 units of packed RBC transfusion last week in [place] for the hemoglobin of 6.2, confirmed from the patient. CT of the abdomen/pelvis showed several enhancing masses in the liver consistent  with liver metastases, masses in the pelvis likely of uterine origin, 9 cm sigmoid colon mass.   Assessment & Plan   Principal Problem:  Nausea & vomiting with colon mass newly diagnosed with liver metastasis - patient had the flexible sigmoidoscopy on 8/26 and had large nearly obstructing mass ~10cm in the rectosigmoid colon. Colon biopsies are still pending - CEA elevated at 467,CA-19-9 also elevated at 1605, CA-125 normal - NPO today for lap colectomy, intraoperative liver biopsy and port placement  Active Problems:  Pelvic mass likely due to uterine fibroid  - Pelvic ultrasound showed mildly enlarged uterus with at least one fibroid and large 7 cm mass seen on CT is not well demonstrated sonographically    Iron deficiency anemia - Ferritin 8, s/p 1 dose of IV Feraheme 8/29  essential hypertension Continue nifedipine  Code Status: Full code  Family Communication: Discussed in detail with the patient, all imaging results, lab results explained to the patient and her husband at the bedside   Disposition Plan: Not medically ready  time Spent in minutes 25 minutes  Procedures  CT abdomen and pelvis Pelvic ultrasound and transvaginal ultrasound  Consults  Interventional  radiology   Gen. Surgery Oncology GI  DVT Prophylaxis  heparin subcutaneous  Medications  Scheduled Meds: . bupivacaine liposome  20 mL Infiltration On Call to OR  . cefoTEtan (CEFOTAN) 2 GM IVPB  2 g Intravenous On Call to OR  . chlorhexidine  60 mL Topical Once  . clindamycin / gentamicin INTRAPERITONEAL Lavage irrigation   Intraperitoneal To OR  . [MAR Hold] lip balm  1 application Topical BID  . [MAR Hold] NIFEdipine  30 mg Oral Daily  . [MAR Hold] polyethylene glycol  34 g Oral BID  . [MAR Hold] sodium chloride  3 mL Intravenous Q12H   Continuous Infusions: . sodium chloride Stopped (04/08/15 1203)   PRN Meds:.[MAR Hold] acetaminophen **OR** [MAR Hold] acetaminophen, [MAR Hold] alum & mag  hydroxide-simeth, [MAR Hold] HYDROcodone-acetaminophen, [MAR Hold]  HYDROmorphone (DILAUDID) injection, [MAR Hold] lactated ringers, [MAR Hold] ondansetron **OR** [MAR Hold] ondansetron (ZOFRAN) IV   Antibiotics   Anti-infectives    Start     Dose/Rate Route Frequency Ordered Stop   04/08/15 0800  clindamycin (CLEOCIN) 900 mg, gentamicin (GARAMYCIN) 240 mg in sodium chloride 0.9 % 1,000 mL for intraperitoneal lavage    Comments:  Pharmacy may adjust dosing strength, schedule, rate of infusion, etc as needed to optimize therapy    Intraperitoneal To Surgery 04/07/15 1128 04/09/15 0800   04/08/15 0600  cefoTEtan (CEFOTAN) 2 g in dextrose 5 % 50 mL IVPB     2 g 100 mL/hr over 30 Minutes Intravenous On call to O.R. 04/07/15 1128 04/09/15 0559   04/07/15 1300  neomycin (MYCIFRADIN) tablet 1,000 mg     1,000 mg Oral 3 times per day on Mon 04/07/15 1128 04/07/15 2208   04/07/15 1300  metroNIDAZOLE (FLAGYL) tablet 1,000 mg    Comments:  Take 2 pills (=1000mg ) by mouth at 1pm, 3pm, and 10pm the day before your colorectal operation   1,000 mg Oral 3 times per day on Mon 04/07/15 1128 04/07/15 2208        Subjective:   Christina [name] was seen and examined today. diarrhea due to colon prep otherwise no acute issues, no abdominal pain nausea vomiting. Patient denies dizziness, chest pain, shortness of breath,  new weakness, numbess, tingling. No acute events overnight.    Objective:   Blood pressure 148/70, pulse 73, temperature 98.2 F (36.8 C), temperature source Oral, resp. rate 20, height 5\' 6"  (1.676 m), weight 72.576 kg (160 lb), SpO2 100 %.  Wt Readings from Last 3 Encounters:  04/06/15 72.576 kg (160 lb)     Intake/Output Summary (Last 24 hours) at 04/08/15 1223 Last data filed at 04/08/15 0809  Gross per 24 hour  Intake 959.25 ml  Output      2 ml  Net 957.25 ml    Exam  General: Alert and oriented x 3, NAD  HEENT:  PERRLA, EOMI, Anicteric Sclera, mucous membranes  moist.   Neck: Supple, no JVD, no masses  CVS: S1 S2 auscultated, no rubs, murmurs or gallops. Regular rate and rhythm.  Respiratory: CTAB  Abdomen: Soft, NT, ND, NBS  Ext: no cyanosis clubbing or edema  Neuro: no new deficits  Skin: No rashes  Psych: Normal affect and demeanor, alert and oriented x3    Data Review   Micro Results Recent Results (from the past 240 hour(s))  Surgical pcr screen     Status: None   Collection Time: 04/08/15 12:57 AM  Result Value Ref Range Status   MRSA, PCR NEGATIVE NEGATIVE  Final   Staphylococcus aureus NEGATIVE NEGATIVE Final    Comment:        The Xpert SA Assay (FDA approved for NASAL specimens in patients over 69 years of age), is one component of a comprehensive surveillance program.  Test performance has been validated by [facility] for patients greater than or equal to 27 year old. It is not intended to diagnose infection nor to guide or monitor treatment.     Radiology Reports Ct Chest W Contrast  04/06/2015   ADDENDUM REPORT: 04/06/2015 13:18  ADDENDUM: Upon further evaluation, The masslike lesion in the pelvis previously question matted small bowel loops represent 9 cm sigmoid colon cancer.   Electronically Signed   By: Abelardo Diesel M.D.   On: 04/06/2015 13:18   04/06/2015   CLINICAL DATA:  Vomiting.  EXAM: CT CHEST, ABDOMEN, AND PELVIS WITH CONTRAST  TECHNIQUE: Multidetector CT imaging of the chest, abdomen and pelvis was performed following the standard protocol during bolus administration of intravenous contrast.  CONTRAST:  33mL OMNIPAQUE IOHEXOL 300 MG/ML SOLN, 187mL OMNIPAQUE IOHEXOL 300 MG/ML SOLN  COMPARISON:  None.  FINDINGS: CT CHEST FINDINGS  Mediastinum/Nodes: There is no mediastinal or hilar lymphadenopathy. There is no mediastinal hematoma. The heart size is normal. There is no pericardial effusion.  Lungs/Pleura: There is no pulmonary mass or nodule. No focal pneumonia or pleural effusion is identified.   Musculoskeletal: No acute abnormality is identified within the visualized bones.  CT ABDOMEN PELVIS FINDINGS  Hepatobiliary: There is a 9.1 x 6.1 cm heterogeneously enhancing mass in the left lobe liver. There is a 0.9 x 1.6 cm mild enhancing mass in the dome of the liver. There is a 1.9 x 1.6 cm enhancing mass at the junction of caudate and right lobe liver. A 3 mm low density lesion is identified in the inferior left lobe liver. The gallbladder is normal.  Pancreas: Normal.  Spleen: Normal.  Adrenals/Urinary Tract: The adrenal glands are normal. They kidneys are normal. There is no nephrolithiasis or hydroureteronephrosis bilaterally.  Stomach/Bowel: There is a question 5.8 cm conglomerate of matted bowel loops in the mid lower abdomen. There is no small bowel obstruction. The colon is normal.  Vascular/Lymphatic: The aorta is normal. There is no aneurysmal dilatation of the aorta. There is no abdominal lymphadenopathy.  Reproductive: There is a 6 x 6.9 cm heterogeneously enhancing mass question arising from the uterus. A second mass, inseparable from the uterus that is lower density than adjacent uterine myometrium measures 5.7 x 7.8 cm.  Other: None.  Musculoskeletal: No focal discrete lytic or blastic lesion is identified within the visualized bones.  IMPRESSION: No acute abnormality identified within the chest. No evidence of metastatic disease.  Several enhancing masses in the liver consistent with liver metastasis.  Masses within the pelvis likely of the Uterine origin. Given the findings within the liver, malignant neoplasm is not excluded.  A conglomerate of matted bowel loops in the mid lower abdomen. There is no evidence of small bowel obstruction.  Electronically Signed: By: Abelardo Diesel M.D. On: 04/06/2015 11:49   US Transvaginal Non-ob  04/06/2015   CLINICAL DATA:  Followup CT to evaluate possible uterine masses. History of uterine fibroids, endometriosis.  EXAM: TRANSABDOMINAL AND TRANSVAGINAL  ULTRASOUND OF PELVIS  TECHNIQUE: Both transabdominal and transvaginal ultrasound examinations of the pelvis were performed. Transabdominal technique was performed for global imaging of the pelvis including uterus, ovaries, adnexal regions, and pelvic cul-de-sac. It was necessary to proceed with endovaginal exam  following the transabdominal exam to visualize the endometrium and ovaries.  COMPARISON:  CT today.  FINDINGS: Uterus  Measurements: 6.3 x 6.3 x 11.2 cm. There is at least 1 fibroid along the right side of the uterine body measuring 2.2 x 2.4 x 2.8 cm. Large mass seen on CT abutting the right-sided the lower uterus is not well demonstrated by ultrasound transabdominally nor endovaginally due to moderate adnexal bowel gas.  Endometrium  Thickness: 4 mm.  No focal abnormality visualized.  Right ovary  Measurements: Not visualized.  Left ovary  Measurements: Not visualized.  Other findings  No free fluid.  IMPRESSION: Mildly enlarged uterus with at least 1 fibroid over the right side of the uterine body measuring 2.8 cm. The large 7 cm mass seen on CT which appears to be arising from the right side of the lower uterine segment is not well demonstrated sonographically.  Nonvisualization of the ovaries.  No free fluid.   Electronically Signed   By: Marin Olp M.D.   On: 04/06/2015 21:05   US Pelvis Complete  04/06/2015   CLINICAL DATA:  Followup CT to evaluate possible uterine masses. History of uterine fibroids, endometriosis.  EXAM: TRANSABDOMINAL AND TRANSVAGINAL ULTRASOUND OF PELVIS  TECHNIQUE: Both transabdominal and transvaginal ultrasound examinations of the pelvis were performed. Transabdominal technique was performed for global imaging of the pelvis including uterus, ovaries, adnexal regions, and pelvic cul-de-sac. It was necessary to proceed with endovaginal exam following the transabdominal exam to visualize the endometrium and ovaries.  COMPARISON:  CT today.  FINDINGS: Uterus  Measurements: 6.3  x 6.3 x 11.2 cm. There is at least 1 fibroid along the right side of the uterine body measuring 2.2 x 2.4 x 2.8 cm. Large mass seen on CT abutting the right-sided the lower uterus is not well demonstrated by ultrasound transabdominally nor endovaginally due to moderate adnexal bowel gas.  Endometrium  Thickness: 4 mm.  No focal abnormality visualized.  Right ovary  Measurements: Not visualized.  Left ovary  Measurements: Not visualized.  Other findings  No free fluid.  IMPRESSION: Mildly enlarged uterus with at least 1 fibroid over the right side of the uterine body measuring 2.8 cm. The large 7 cm mass seen on CT which appears to be arising from the right side of the lower uterine segment is not well demonstrated sonographically.  Nonvisualization of the ovaries.  No free fluid.   Electronically Signed   By: Marin Olp M.D.   On: 04/06/2015 21:05   Ct Abdomen Pelvis W Contrast  04/06/2015   ADDENDUM REPORT: 04/06/2015 13:18  ADDENDUM: Upon further evaluation, The masslike lesion in the pelvis previously question matted small bowel loops represent 9 cm sigmoid colon cancer.   Electronically Signed   By: Abelardo Diesel M.D.   On: 04/06/2015 13:18   04/06/2015   CLINICAL DATA:  Vomiting.  EXAM: CT CHEST, ABDOMEN, AND PELVIS WITH CONTRAST  TECHNIQUE: Multidetector CT imaging of the chest, abdomen and pelvis was performed following the standard protocol during bolus administration of intravenous contrast.  CONTRAST:  45mL OMNIPAQUE IOHEXOL 300 MG/ML SOLN, 130mL OMNIPAQUE IOHEXOL 300 MG/ML SOLN  COMPARISON:  None.  FINDINGS: CT CHEST FINDINGS  Mediastinum/Nodes: There is no mediastinal or hilar lymphadenopathy. There is no mediastinal hematoma. The heart size is normal. There is no pericardial effusion.  Lungs/Pleura: There is no pulmonary mass or nodule. No focal pneumonia or pleural effusion is identified.  Musculoskeletal: No acute abnormality is identified within the visualized bones.  CT ABDOMEN PELVIS  FINDINGS  Hepatobiliary: There is a 9.1 x 6.1 cm heterogeneously enhancing mass in the left lobe liver. There is a 0.9 x 1.6 cm mild enhancing mass in the dome of the liver. There is a 1.9 x 1.6 cm enhancing mass at the junction of caudate and right lobe liver. A 3 mm low density lesion is identified in the inferior left lobe liver. The gallbladder is normal.  Pancreas: Normal.  Spleen: Normal.  Adrenals/Urinary Tract: The adrenal glands are normal. They kidneys are normal. There is no nephrolithiasis or hydroureteronephrosis bilaterally.  Stomach/Bowel: There is a question 5.8 cm conglomerate of matted bowel loops in the mid lower abdomen. There is no small bowel obstruction. The colon is normal.  Vascular/Lymphatic: The aorta is normal. There is no aneurysmal dilatation of the aorta. There is no abdominal lymphadenopathy.  Reproductive: There is a 6 x 6.9 cm heterogeneously enhancing mass question arising from the uterus. A second mass, inseparable from the uterus that is lower density than adjacent uterine myometrium measures 5.7 x 7.8 cm.  Other: None.  Musculoskeletal: No focal discrete lytic or blastic lesion is identified within the visualized bones.  IMPRESSION: No acute abnormality identified within the chest. No evidence of metastatic disease.  Several enhancing masses in the liver consistent with liver metastasis.  Masses within the pelvis likely of the Uterine origin. Given the findings within the liver, malignant neoplasm is not excluded.  A conglomerate of matted bowel loops in the mid lower abdomen. There is no evidence of small bowel obstruction.  Electronically Signed: By: Abelardo Diesel M.D. On: 04/06/2015 11:49    CBC  Recent Labs Lab 04/06/15 0945 04/07/15 0537 04/08/15 0553  WBC 7.7 7.2 7.3  HGB 9.6* 8.9* 8.7*  HCT 32.6* 30.9* 30.2*  PLT 735* 597* 579*  MCV 59.5* 60.2* 60.3*  MCH 17.5* 17.3* 17.4*  MCHC 29.4* 28.8* 28.8*  RDW 29.4* 29.4* 29.7*  LYMPHSABS 0.7  --  1.5  MONOABS  0.3  --  0.7  EOSABS 0.1  --  0.4  BASOSABS 0.0  --  0.0    Chemistries   Recent Labs Lab 04/06/15 0945 04/07/15 0537  NA 139 143  K 3.2* 3.3*  CL 104 107  CO2 24 28  GLUCOSE 105* 91  BUN 10 <5*  CREATININE 0.90 0.80  CALCIUM 9.5 9.1  AST 22  --   ALT 10*  --   ALKPHOS 63  --   BILITOT 0.8  --    ------------------------------------------------------------------------------------------------------------------ estimated creatinine clearance is 81 mL/min (by C-G formula based on Cr of 0.8). ------------------------------------------------------------------------------------------------------------------ No results for input(s): HGBA1C in the last 72 hours. ------------------------------------------------------------------------------------------------------------------ No results for input(s): CHOL, HDL, LDLCALC, TRIG, CHOLHDL, LDLDIRECT in the last 72 hours. ------------------------------------------------------------------------------------------------------------------ No results for input(s): TSH, T4TOTAL, T3FREE, THYROIDAB in the last 72 hours.  Invalid input(s): FREET3 ------------------------------------------------------------------------------------------------------------------  Recent Labs  04/06/15 1432  VITAMINB12 636  FOLATE 32.7  FERRITIN 8*  TIBC 406  IRON 17*  RETICCTPCT RESULTS UNAVAILABLE DUE TO INTERFERING SUBSTANCE    Coagulation profile  Recent Labs Lab 04/07/15 1100 04/08/15 0553  INR 1.17 1.26    No results for input(s): DDIMER in the last 72 hours.  Cardiac Enzymes No results for input(s): CKMB, TROPONINI, MYOGLOBIN in the last 168 hours.  Invalid input(s): CK ------------------------------------------------------------------------------------------------------------------ Invalid input(s): POCBNP  No results for input(s): GLUCAP in the last 72 hours.   RAI,RIPUDEEP M.D. [facility] 04/08/2015, 12:23 PM  Pager:  [phone] Between  7am to 7pm - call Pager - [phone]  After 7pm go to [email or web address] - password [email or web address]  Call night coverage person covering after 7pm

## 2015-04-08 NOTE — Consult Note (Addendum)
Christina Craver, MD Physician Addendum Gastroenterology Progress Notes 04/07/2015  6:41 PM    Expand All Collapse All   HPI: 53 year old black female, initially seen by me in consultation on 04/03/15 for rectal bleeding [that she has had for about 3 months] and severe anemia requiring transfusions last week when she had bled down to 6.2 gm/dl had a flexible sigmoidoscopy [changed from a colonoscopy as I could not pass the scope through the mass] with biopsies of a large sigmoid colon near obstructing mass, presented to the ER with nausea and vomiting over the weekend-multiple liver mets on admission CT. Dr, Burr Medico has contacted me about stenting this mass so that she could proceed with chemotherapy. Appreciate all the help from the consultants.  PMH 1) Uterine fibroids with history of DUB. 2) HTN. 3) Endometriosis 4) Thalassemia.   Family History 2 brothers died of AIDS in their 8's. One maternal first cousin with colon cancer in their 23's. She has 2 other sisters in [place] who are healthy.  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 Denies ETOH or drug use.   Vital signs in last 24 hours: Temp:  [98 F (36.7 C)-98.3 F (36.8 C)] 98 F (36.7 C) (08/29 1350) Pulse Rate:  [73-77] 77 (08/29 1350) Resp:  [18-19] 18 (08/29 1350) BP: (121-157)/(66-79) 157/79 mmHg (08/29 1350) SpO2:  [100 %] 100 % (08/29 1350) Last BM Date: 04/07/15  Intake/Output from previous day: 08/28 0701 - 08/29 0700 In: 1398.8 [P.O.:240; I.V.:1158.8] Out: -  Intake/Output this shift:   General appearance: alert, cooperative, appears older than stated age and no distress Resp: clear to auscultation bilaterally Cardio: regular rate and rhythm, S1, S2 normal, no murmur, click, rub or gallop GI: soft with LLQ tenderness on palpation without gaurding; bowel sounds normal; no masses,  no organomegaly  Lab Results:  Recent Labs (last 2 labs)      Recent Labs   04/06/15 0945   04/07/15 0537   WBC  7.7  7.2   HGB  9.6*  8.9*   HCT  32.6*  30.9*   PLT  735*  597*      BMET  Recent Labs (last 2 labs)      Recent Labs   04/06/15 0945  04/07/15 0537   NA  139  143   K  3.2*  3.3*   CL  104  107   CO2  24  28   GLUCOSE  105*  91   BUN  10  <5*   CREATININE  0.90  0.80   CALCIUM  9.5  9.1      LFT  Recent Labs (last 2 labs)      Recent Labs   04/06/15 0945   PROT  7.7   ALBUMIN  3.9   AST  22   ALT  10*   ALKPHOS  63   BILITOT  0.8      PT/INR  Recent Labs (last 2 labs)      Recent Labs   04/07/15 1100   LABPROT  15.1   INR  1.17      Studies/Results:  Imaging Results (Last 48 hours)    Ct Chest W Contrast  04/06/2015   ADDENDUM REPORT: 04/06/2015 13:18  ADDENDUM: Upon further evaluation, The masslike lesion in the pelvis previously question matted small bowel loops represent 9 cm sigmoid colon cancer.   Electronically Signed   By: Mallie Darting.D.  On: 04/06/2015 13:18   04/06/2015   CLINICAL DATA:  Vomiting.  EXAM: CT CHEST, ABDOMEN, AND PELVIS WITH CONTRAST  TECHNIQUE: Multidetector CT imaging of the chest, abdomen and pelvis was performed following the standard protocol during bolus administration of intravenous contrast.  CONTRAST:  55mL OMNIPAQUE IOHEXOL 300 MG/ML SOLN, 158mL OMNIPAQUE IOHEXOL 300 MG/ML SOLN  COMPARISON:  None.  FINDINGS: CT CHEST FINDINGS  Mediastinum/Nodes: There is no mediastinal or hilar lymphadenopathy. There is no mediastinal hematoma. The heart size is normal. There is no pericardial effusion.  Lungs/Pleura: There is no pulmonary mass or nodule. No focal pneumonia or pleural effusion is identified.  Musculoskeletal: No acute abnormality is identified within the visualized bones.  CT ABDOMEN PELVIS FINDINGS  Hepatobiliary: There is a 9.1 x 6.1 cm heterogeneously enhancing mass in the left lobe liver. There is a 0.9 x 1.6 cm mild enhancing mass in the dome of the liver. There is a 1.9 x 1.6 cm enhancing mass at  the junction of caudate and right lobe liver. A 3 mm low density lesion is identified in the inferior left lobe liver. The gallbladder is normal.  Pancreas: Normal.  Spleen: Normal.  Adrenals/Urinary Tract: The adrenal glands are normal. They kidneys are normal. There is no nephrolithiasis or hydroureteronephrosis bilaterally.  Stomach/Bowel: There is a question 5.8 cm conglomerate of matted bowel loops in the mid lower abdomen. There is no small bowel obstruction. The colon is normal.  Vascular/Lymphatic: The aorta is normal. There is no aneurysmal dilatation of the aorta. There is no abdominal lymphadenopathy.  Reproductive: There is a 6 x 6.9 cm heterogeneously enhancing mass question arising from the uterus. A second mass, inseparable from the uterus that is lower density than adjacent uterine myometrium measures 5.7 x 7.8 cm.  Other: None.  Musculoskeletal: No focal discrete lytic or blastic lesion is identified within the visualized bones.  IMPRESSION: No acute abnormality identified within the chest. No evidence of metastatic disease.  Several enhancing masses in the liver consistent with liver metastasis.  Masses within the pelvis likely of the Uterine origin. Given the findings within the liver, malignant neoplasm is not excluded. A conglomerate of matted bowel loops in the mid lower abdomen. There is no evidence of small bowel obstruction.  Electronically Signed: By: Abelardo Diesel M.D. On: 04/06/2015 11:49   US Transvaginal Non-ob  04/06/2015   CLINICAL DATA:  Followup CT to evaluate possible uterine masses. History of uterine fibroids, endometriosis.  EXAM: TRANSABDOMINAL AND TRANSVAGINAL ULTRASOUND OF PELVIS  TECHNIQUE: Both transabdominal and transvaginal ultrasound examinations of the pelvis were performed. Transabdominal technique was performed for global imaging of the pelvis including uterus, ovaries, adnexal regions, and pelvic cul-de-sac. It was necessary to proceed with endovaginal exam  following the transabdominal exam to visualize the endometrium and ovaries.  COMPARISON:  CT today.  FINDINGS: Uterus  Measurements: 6.3 x 6.3 x 11.2 cm. There is at least 1 fibroid along the right side of the uterine body measuring 2.2 x 2.4 x 2.8 cm. Large mass seen on CT abutting the right-sided the lower uterus is not well demonstrated by ultrasound transabdominally nor endovaginally due to moderate adnexal bowel gas.  Endometrium  Thickness: 4 mm.  No focal abnormality visualized.  Right ovary  Measurements: Not visualized.  Left ovary  Measurements: Not visualized.  Other findings  No free fluid.  IMPRESSION: Mildly enlarged uterus with at least 1 fibroid over the right side of the uterine body measuring 2.8 cm. The large 7 cm  mass seen on CT which appears to be arising from the right side of the lower uterine segment is not well demonstrated sonographically.  Nonvisualization of the ovaries.  No free fluid.   Electronically Signed   By: Marin Olp M.D.   On: 04/06/2015 21:05   US Pelvis Complete  04/06/2015   CLINICAL DATA:  Followup CT to evaluate possible uterine masses. History of uterine fibroids, endometriosis.  EXAM: TRANSABDOMINAL AND TRANSVAGINAL ULTRASOUND OF PELVIS  TECHNIQUE: Both transabdominal and transvaginal ultrasound examinations of the pelvis were performed. Transabdominal technique was performed for global imaging of the pelvis including uterus, ovaries, adnexal regions, and pelvic cul-de-sac. It was necessary to proceed with endovaginal exam following the transabdominal exam to visualize the endometrium and ovaries.  COMPARISON:  CT today.  FINDINGS: Uterus  Measurements: 6.3 x 6.3 x 11.2 cm. There is at least 1 fibroid along the right side of the uterine body measuring 2.2 x 2.4 x 2.8 cm. Large mass seen on CT abutting the right-sided the lower uterus is not well demonstrated by ultrasound transabdominally nor endovaginally due to moderate adnexal bowel gas.  Endometrium  Thickness:  4 mm.  No focal abnormality visualized.  Right ovary  Measurements: Not visualized.  Left ovary  Measurements: Not visualized.  Other findings  No free fluid.  IMPRESSION: Mildly enlarged uterus with at least 1 fibroid over the right side of the uterine body measuring 2.8 cm. The large 7 cm mass seen on CT which appears to be arising from the right side of the lower uterine segment is not well demonstrated sonographically.  Nonvisualization of the ovaries.  No free fluid.   Electronically Signed   By: Marin Olp M.D.   On: 04/06/2015 21:05   Ct Abdomen Pelvis W Contrast  04/06/2015   ADDENDUM REPORT: 04/06/2015 13:18  ADDENDUM: Upon further evaluation, The masslike lesion in the pelvis previously question matted small bowel loops represent 9 cm sigmoid colon cancer.   Electronically Signed   By: Abelardo Diesel M.D.   On: 04/06/2015 13:18   04/06/2015   CLINICAL DATA:  Vomiting.  EXAM: CT CHEST, ABDOMEN, AND PELVIS WITH CONTRAST  TECHNIQUE: Multidetector CT imaging of the chest, abdomen and pelvis was performed following the standard protocol during bolus administration of intravenous contrast.  CONTRAST:  41mL OMNIPAQUE IOHEXOL 300 MG/ML SOLN, 125mL OMNIPAQUE IOHEXOL 300 MG/ML SOLN  COMPARISON:  None.  FINDINGS: CT CHEST FINDINGS  Mediastinum/Nodes: There is no mediastinal or hilar lymphadenopathy. There is no mediastinal hematoma. The heart size is normal. There is no pericardial effusion.  Lungs/Pleura: There is no pulmonary mass or nodule. No focal pneumonia or pleural effusion is identified.  Musculoskeletal: No acute abnormality is identified within the visualized bones.  CT ABDOMEN PELVIS FINDINGS  Hepatobiliary: There is a 9.1 x 6.1 cm heterogeneously enhancing mass in the left lobe liver. There is a 0.9 x 1.6 cm mild enhancing mass in the dome of the liver. There is a 1.9 x 1.6 cm enhancing mass at the junction of caudate and right lobe liver. A 3 mm low density lesion is identified in the inferior  left lobe liver. The gallbladder is normal.  Pancreas: Normal.  Spleen: Normal.  Adrenals/Urinary Tract: The adrenal glands are normal. They kidneys are normal. There is no nephrolithiasis or hydroureteronephrosis bilaterally.  Stomach/Bowel: There is a question 5.8 cm conglomerate of matted bowel loops in the mid lower abdomen. There is no small bowel obstruction. The colon is normal.  Vascular/Lymphatic: The  aorta is normal. There is no aneurysmal dilatation of the aorta. There is no abdominal lymphadenopathy.  Reproductive: There is a 6 x 6.9 cm heterogeneously enhancing mass question arising from the uterus. A second mass, inseparable from the uterus that is lower density than adjacent uterine myometrium measures 5.7 x 7.8 cm.  Other: None.  Musculoskeletal: No focal discrete lytic or blastic lesion is identified within the visualized bones.  IMPRESSION: No acute abnormality identified within the chest. No evidence of metastatic disease.  Several enhancing masses in the liver consistent with liver metastasis.  Masses within the pelvis likely of the Uterine origin. Given the findings within the liver, malignant neoplasm is not excluded. A conglomerate of matted bowel loops in the mid lower abdomen. There is no evidence of small bowel obstruction.  Electronically Signed: By: Abelardo Diesel M.D. On: 04/06/2015 11:49     Medications: I have reviewed the patient's current medications.  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 As per my discussion with Dr. Johney Maine, and Dr. Burr Medico, I do not think this mass can be stented; plans are to do a partial colectomy tomorrow with a liver biopsy,   2) Severe iron deficiency anemia secondary to colon mass. 3) HTN. 4) Uterine fibroids vs drop mets.    LOS: 1 day   Navea Woodrow 04/07/2015, 6:41 PM           Revision History

## 2015-04-08 NOTE — Op Note (Signed)
04/08/2015  5:33 PM  PATIENT:  Christina [name]  54 y.o. female  Patient Care Team: Benito Mccreedy, MD as PCP - General (Internal Medicine) Juanita Craver, MD as Consulting Physician (Gastroenterology) Michael Boston, MD as Consulting Physician (General Surgery)  PRE-OPERATIVE DIAGNOSIS:  obstructing sigmoid colon cancer, liver mass  POST-OPERATIVE DIAGNOSIS:  Obstructing sigmoid colon cancer, liver mass  PROCEDURE:   LAPAROSCOPIC SIGMOID COLECTOMY Laparoscopic mobilization of the splenic flexure of the colon CORE NEEDLE LIVER BIOPSY RIGID PROCTOSCOPY INSERTION PORT-A-CATH  SURGEON:  Surgeon(s): Michael Boston, MD Greer Pickerel, MD - Assist  ANESTHESIA:     local and general Experel at end  EBL:  Total I/O In: 1000 [I.V.:1000] Out: 45 [Urine:476; Stool:1; Blood:125]  Delay start of Pharmacological VTE agent (>24hrs) due to surgical blood loss or risk of bleeding:  no  DRAINS: none   SPECIMEN:    1.  Sigmoid colon. (open end proximal)  2.  Distal anastomotic ring.  3.  Cord liver biopsies.  Sent for frozen.  Adenocarcinoma confirmed.  DISPOSITION OF SPECIMEN:  PATHOLOGY  COUNTS:  YES  PLAN OF CARE: Admit to inpatient   PATIENT DISPOSITION:  PACU - hemodynamically stable.  INDICATION:    Pleasant physician with rectal bleeding.  Found to have near obstructing colon cancer mass.  Then became more obstructed.  Liver masses as well.  Recommedation made for laparotomy, possible open palliative resection of primary cancer since it has been nearly completely obstructing.  Also biopsy of liver.  Insertion of porta catheter.  The anatomy & physiology of the digestive tract was discussed.  The pathophysiology was discussed.  Natural history risks without surgery was discussed.   I worked to give an overview of the disease and the frequent need to have multispecialty involvement.  I feel the risks of no intervention will lead to serious problems that outweigh the operative  risks; therefore, I recommended a partial colectomy to remove the pathology.  Laparoscopic & open techniques were discussed.   Risks such as bleeding, infection, abscess, leak, reoperation, possible ostomy, hernia, heart attack, death, and other risks were discussed.  I noted a good likelihood this will help address the problem.   Goals of post-operative recovery were discussed as well.  We will work to minimize complications.  Educational materials on the pathology had been given in the office.  Questions were answered.    Use of a central venous catheter for intravenous therapy was discussed.  Technique of catheter placement using ultrasound and fluoroscopy guidance was discussed.  Risks such as bleeding, infection, pneumothorax, catheter occlusion, reoperation, and other risks were discussed.   I noted a good likelihood this will help address the problem.  Questions were answered.  The patient expressed understanding & wishes to proceed.  The patient expressed understanding & wished to proceed with surgery.  OR FINDINGS:   Patient had a bulky mass in the mid sigmoid colon.  Mild dilation proximally but no severe obstruction.  Patient had very bulky tumor in left lateral sector of liver erupting through posterior region but not adherent to stomach.  No obvious metastatic disease on peritoneum.  Large bulky fibroid on left anterior dome of uterus.    The anastomosis rests 13 cm from the anal verge by rigid proctoscopy.  It is an 8 Fr Clear Vue PowerPort.  Tip rests at the superior vena cava/right atrial junction  DESCRIPTION:   Informed consent was confirmed.  The patient underwent general anaesthesia without difficulty.  The patient was positioned appropriately.  VTE prevention in place.  The patient's abdomen was clipped, prepped, & draped in a sterile fashion.  Surgical timeout confirmed our plan.  The patient was positioned in reverse Trendelenburg.  Abdominal entry was gained using  optical entry technique in the right upper abdomen.  Entry was clean.  I induced carbon dioxide insufflation.  Camera inspection revealed no injury.  Extra ports were carefully placed under direct laparoscopic visualization.  I could see an obvious bulky mass in the proximal/mid sigmoid colon.  The left lateral sector liver was obviously enlarged.  I could roll it off the lesser curvature of the stomach to see the bulky mass.  This correlated with the obvious tumor replacing most of the left lateral sector.  I did 14G TruCut needle core biopsy passes through the left subcostal ridge into the liver through center of the mass x5 until I got 3 good cores.  Sent that for frozen section.  Came back positive for adenocarcinoma, favor colonic.  I turned attention back towards the lower abdomen.  I could see greater omental adhesions to the left pelvis and especially the large cluster of fibroids on the left anterior dome of the uterus.  I transected the greater omentum off this as well as the pelvic sidewall.  I reflected the greater omentum and the upper abdomen the small bowel in the upper abdomen. I scored the base of peritoneum of the right side of the mesentery of the left colon from the ligament of Treitz to the peritoneal reflection of the mid rectum.  I elevated the sigmoid mesentery and enetered into the retro-mesenteric plane. We were able to identify the left ureter and gonadal vessels. We kept those posterior within the retroperitoneum and elevated the left colon mesentery off that. I did isolated IMA pedicle but did not ligate it yet.  I continued distally and got into the avascular plane posterior to the mesorectum. This allowed me to help mobilize the rectum as well by freeing the mesorectum off the sacrum.  I mobilized the peritoneal coverings towards the peritoneal reflection on both the right and left sides of the rectum.  I could see the right and left ureters and stayed away from them.  I kept the  lateral vascular pedicles to the rectum intact.  I left the nerve ergentes in the retroperitoneal space.  I skeletonized the lymph nodes off the inferior mesenteric artery pedicle.  I went down to its takeoff from the aorta.  I isolated the inferior mesenteric vein off of the ligament of Treitz just cephalad to that as well.  After confirming the left ureter was out of the way, I went ahead and ligated the inferior mesenteric artery pedicle with bipolar EnSeal just near its takeoff from the aorta. I did ligate the inferior mesenteric vein in a similar fashion.  We ensured hemostasis. I skeletonized the mesorectum at the junction at the proximal rectum using blunt dissection & bipolar EnSeal.  I mobilized the left colon in a lateral to medial fashion off the line of Toldt up towards the splenic flexure.  Despite that I could not get good mobilization of the left colon to reach into the pelvis.  I therefore completed retroperitoneal dissection off the left kidney and adrenal.  I mobilized the splenic flexure and a lateral to medial and superior to inferior fashion.  Transected off the inferior pancreatic ridge is well.  Treated off to the came to the dominant left middle colic pedicle.  With that I had much  better mobilization of distal transverse colon to reach down into the pelvis.  I skeletonized the mesorectum of the proximal rectum.  I transected the proximal left colonic mesentery radially just proximal to the inferior mesenteric vein to keep that pedicle and lymph nodes tacked to the proximal descending colon since it was somewhat foreshortened and good margins.  I placed wound protector through a Pfannenstiel incision in the suprapubic region, taking care to avoid bladder injury. I transected bowel at the proximal rectum using a contour stapler. I was able to eviscerate the rectosigmoid and left colon out the wound.  The area in the proximal descending colon that I had previously transected its mesentery  was viable, soft, and easily reached down. I clamped the colon proximal to this area using a soft bowel clamp. I transected at the descending colon with a scalpel. I got healthy bleeding mucosa. I transected the remaining specimen mesentery in a radial fashion to preserve good blood supply to the proximal colon end.  We sent the rectosigmoid colon specimen off to go to pathology.  We sized the colon orifice.  I chose a 33 EEA anvil stapler system. I placed the anvil to the open end of the descending colon and closed around it using a 0 Prolene pursestring.  We did copious irrigation with crystalloid solution.  Hemostasis was good.  The distal end of the colon at the handle easily reached down to the rectal stump.  Dr Redmond Pulling scrubbed down and did gentle anal dilation and advanced the EEA stapler up the rectal stump. The spike was brought out at the provimal end of the rectal stump under direct visualization.  I attached the anvil of the proximal colon the spike of the stapler. Anvil was tightened down and held clamped for 60 seconds. The EEA stapler was fired and held clamped for 30 seconds. The stapler was released & removed. We noted 2 excellent anastomotic rings. Blue stitch is in the proximal ring.  Dr Redmond Pulling did rigid proctoscopy noted the anastomosis was at 13 cm from the anal verge consistent with the proximal rectum.  We did a final irrigation of antibiotic solution (900 mg clindamycin/240 mg gentamicin in a liter of crystalloid) & held that for the pelvic air leak test .  The rectum was insufflated the rectum while clamping the colon proximal to that anastomosis.  There was a negative air leak test. There was no tension of mesentery or bowel at the anastomosis.   Tissues looked viable.  We did diagnostic laparoscopy.  Hemostasis was good.   Ureters & bowel uninjured.  The anastomosis looked healthy.  I brought down greater omentum towards the pelvis  We changed gown and gloves.  The patient was  re-draped.  Sterile unused instruments were used from this point out per colon SSI prevention protocol.   I closed the 110mm port sites using Monocryl stitch and sterile dressing.  I closed the Pfannenstiel wound using a 0 Vicryl vertical peritoneal closure and a #1 PDS transverse anterior rectal fascial closure. I closed the skin with some interrupted Monocryl stitches. I placed antibiotic-soaked wicks into the closure at the corners & centrally x4 between those areas.  I used 0.25% bupivacaine at the beginning case near the incision.  At the end the case, I placed liposomal bupivacaine (Experel) as a field block around the Pfannenstiel incision.  Sterile dressings applied.  Next we focused attention on placement of the portacatheter per request of her medical oncologist.  Gen. GelPad paced behind  thoracic spine and the chest and neck were prepped and draped in a sterile fashion.  Surgical timeout confirmed our plan.  I used the SonoSite ultrasound to identify the right jugular vein.  I made a venipuncture on first attempt under ultrasonic guidance.  Passed a wire under fluoroscopic guidance into the inferior vena cava.  I created a pocket in the right lateral supine infraclavicular space.  I tunneled the Port-A-Cath from the clavicular wound to the puncture site on the right neck.  I placed 20 rolling sutures in 4 corners of the wound and through the port of the clear view.  Held this clamp.  Attached the catheter to the port.  It flushed well.  Placed a dilator over the wire using Seldinger technique.  I then replaced with the dilator inside the sheath.  I used fluoroscopy while I  pulled the wire such that the curved tip was at the superior vena cava/right atrial junction.  I then pulled back towards the neck puncture site to measure the intravenous segment.  I trimmed the tunneled catheter to that appropriate length.  Wire and tunneler removed.  Catheter passed through the sheath.  Sheath peeled away.   Fluoroscopy revealed the tip of the portacatheter  was more in the right atrium.  Pulled back 2 cm in the subcutaneous tissues.  Fluoroscopy confirmed tipped now at the superior vena cava/right atrial junction.  Catheter flushed and aspirated well.  Secured the catheter collar to the port.  Tied the Prolenes sutures down to secure the port to the infra clavicular pectoralis fascia in the pocket..  Catheter flushed and aspirated well.  Used flush of diluted heparinized saline.  Skin at the port site closed with 4 Monocryl suture.  Sterile dressing applied.  I placed the port of the  Patient is being extubated go to recovery room. I discussed postop care with the patient in detail the office & in the holding area. Instructions are written.  I'm about to locate family and discuss it with them as well.  [name], M.D., F.A.C.S. Gastrointestinal and Minimally Invasive Surgery [facility] [address], [place], [place] [address] [phone] Main / Paging

## 2015-04-08 NOTE — Discharge Instructions (Signed)
SURGERY: POST OP INSTRUCTIONS °(Surgery for small bowel obstruction, colon resection, etc)  ° °1. DIET: Follow a light bland diet the first 24 hours after arrival home, such as soup, liquids, crackers, etc.  Be sure to include lots of fluids daily.  Avoid fast food or heavy meals as your are more likely to get nauseated.  Stay on a low fat diet the next few days after surgery.  Gradually add a fiber supplement to your diet over the next week.   Your should try to eat a low-fat, high fiber diet the rest of your life thereafter (See Below). °  °2. Take your usually prescribed home medications unless otherwise directed.  OK to take aspirin.    If you are on strong blood thinners (warfarin/Coumadin, Plavix, Xerelto, Eliquis, etc), discuss with your surgeon, medicine PCP, and/or cardiologist for instructions on when to restart the blood thinner & if blood monitoring is needed (PT/INR blood check, etc).  Usually you can restart any strong blood thinners after the second postoperative day. ° °3. PAIN CONTROL: ° °Pain after surgery or related to activity is often due to strain/injury to muscle, tendon, nerves and/or incisions.  This pain is usually short-term and will improve in a few months.  ° °Many people find it helpful to do the following things TOGETHER to help speed the process of healing and to get back to regular activity more quickly: ° °1. Avoid heavy physical activity at first °a. No lifting greater than 20 pounds at first, then increase to lifting as tolerated over the next few weeks °b. Do not “push through” the pain.  Listen to your body and avoid positions and maneuvers than reproduce the pain.  Wait a few days before trying something more intense °c. Walking is okay as tolerated, but go slowly and stop when getting sore.  If you can walk 30 minutes without stopping or pain, you can try more intense activity (running, jogging, aerobics, cycling, swimming, treadmill, sex, sports, weightlifting, etc  ) °d. Remember: If it hurts to do it, then don’t do it! ° °2. Take Anti-inflammatory medication °a. Choose ONE of the following over-the-counter medications: °i.            Acetaminophen 500mg tabs (Tylenol) 1-2 pills with every meal and just before bedtime (avoid if you have liver problems) °ii.            Naproxen 220mg tabs (ex. Aleve) 1-2 pills twice a day (avoid if you have kidney, stomach, IBD, or bleeding problems) °iii. Ibuprofen 200mg tabs (ex. Advil, Motrin) 3-4 pills with every meal and just before bedtime (avoid if you have kidney, stomach, IBD, or bleeding problems) °b. Take with food/snack around the clock for 1-2 weeks °i. This helps the muscle and nerve tissues become less irritable and calm down faster ° °3. Use a Heating pad or Ice/Cold Pack °a. Most patients will experience some swelling and bruising around the incisions.  Swelling and bruising can take several weeks to resolve. °i. Ice packs or heating pads (30-60 minutes up to 6 times a day) will help. °ii. Use ice for the first few days to help decrease swelling and bruising °iii. Switch to heat to help relax tight/sore spots and speed recovery.  °iv. Some people prefer to use ice alone, heat alone, alternating between ice & heat.  Experiment to what works for you °a. May use warm bath/hottub  or showers ° °4. Try Gentle Massage and/or Stretching  °a. at the area of   pain many times a day °b. stop if you feel pain - do not overdo it °5. Prescription for pain medication (such as oxycodone, hydrocodone, etc) should be given to you upon discharge.  Take your pain medication as prescribed. °a. If you are having problems/concerns with the prescription medicine (does not control pain, nausea, vomiting, rash, itching, etc), please call us [phone] to see if we need to switch you to a different pain medicine that will work better for you and/or control your side effect better. °b. If you need a refill on your pain medication, please contact your  pharmacy.  They will contact our office to request authorization. Prescriptions will not be filled after 5 pm or on week-ends. °c.  °Try these steps together to help you body heal faster and avoid making things get worse.  Doing just one of these things may not be enough.   ° °If you are not getting better after two weeks or are noticing you are getting worse, contact our office for further advice; we may need to re-evaluate you & see what other things we can do to help. ° ° °GETTING TO GOOD BOWEL HEALTH. °Irregular bowel habits such as constipation and diarrhea can lead to many problems over time.  Having one soft bowel movement a day is the most important way to prevent further problems.  The anorectal canal is designed to handle stretching and feces to safely manage our ability to get rid of solid waste (feces, poop, stool) out of our body.  BUT, hard constipated stools can act like ripping concrete bricks and diarrhea can be a burning fire to this very sensitive area of our body, causing inflamed hemorrhoids, anal fissures, increasing risk is perirectal abscesses, abdominal pain/bloating, an making irritable bowel worse.     ° °The goal: ONE SOFT BOWEL MOVEMENT A DAY!  To have soft, regular bowel movements:  °• Drink plenty of fluids, consider 4-6 tall glasses of water a day.   °• Take plenty of fiber.  Fiber is the undigested part of plant food that passes into the colon, acting s “natures broom” to encourage bowel motility and movement.  Fiber can absorb and hold large amounts of water. This results in a larger, bulkier stool, which is soft and easier to pass. Work gradually over several weeks up to 6 servings a day of fiber (25g a day even more if needed) in the form of: °o Vegetables -- Root (potatoes, carrots, turnips), leafy green (lettuce, salad greens, celery, spinach), or cooked high residue (cabbage, broccoli, etc) °o Fruit -- Fresh (unpeeled skin & pulp), Dried (prunes, apricots, cherries, etc ),  or  stewed ( applesauce)  °o Whole grain breads, pasta, etc (whole wheat)  °o Bran cereals  °• Bulking Agents -- This type of water-retaining fiber generally is easily obtained each day by one of the following:  °o Psyllium bran -- The psyllium plant is remarkable because its ground seeds can retain so much water. This product is available as Metamucil, Konsyl, Effersyllium, Per Diem Fiber, or the less expensive generic preparation in drug and health food stores. Although labeled a laxative, it really is not a laxative.  °o Methylcellulose -- This is another fiber derived from wood which also retains water. It is available as Citrucel. °o Polyethylene Glycol - and “artificial” fiber commonly called Miralax or Glycolax.  It is helpful for people with gassy or bloated feelings with regular fiber °o Flax Seed - a less gassy fiber than   psyllium °• No reading or other relaxing activity while on the toilet. If bowel movements take longer than 5 minutes, you are too constipated °• AVOID CONSTIPATION.  High fiber and water intake usually takes care of this.  Sometimes a laxative is needed to stimulate more frequent bowel movements, but  °• Laxatives are not a good long-term solution as it can wear the colon out.  They can help jump-start bowels if constipated, but should be relied on constantly without discussing with your doctor °o Osmotics (Milk of Magnesia, Fleets phosphosoda, Magnesium citrate, MiraLax, GoLytely) are safer than  °o Stimulants (Senokot, Castor Oil, Dulcolax, Ex Lax)    °o Avoid taking laxatives for more than 7 days in a row. °•  IF SEVERELY CONSTIPATED, try a Bowel Retraining Program: °o Do not use laxatives.  °o Eat a diet high in roughage, such as bran cereals and leafy vegetables.  °o Drink six (6) ounces of prune or apricot juice each morning.  °o Eat two (2) large servings of stewed fruit each day.  °o Take one (1) heaping tablespoon of a psyllium-based bulking agent twice a day. Use sugar-free  sweetener when possible to avoid excessive calories.  °o Eat a normal breakfast.  °o Set aside 15 minutes after breakfast to sit on the toilet, but do not strain to have a bowel movement.  °o If you do not have a bowel movement by the third day, use an enema and repeat the above steps.  °• Controlling diarrhea °o Switch to liquids and simpler foods for a few days to avoid stressing your intestines further. °o Avoid dairy products (especially milk & ice cream) for a short time.  The intestines often can lose the ability to digest lactose when stressed. °o Avoid foods that cause gassiness or bloating.  Typical foods include beans and other legumes, cabbage, broccoli, and dairy foods.  Every person has some sensitivity to other foods, so listen to our body and avoid those foods that trigger problems for you. °o Adding fiber (Citrucel, Metamucil, psyllium, Miralax) gradually can help thicken stools by absorbing excess fluid and retrain the intestines to act more normally.  Slowly increase the dose over a few weeks.  Too much fiber too soon can backfire and cause cramping & bloating. °o Probiotics (such as active yogurt, Align, etc) may help repopulate the intestines and colon with normal bacteria and calm down a sensitive digestive tract.  Most studies show it to be of mild help, though, and such products can be costly. °o Medicines: °- Bismuth subsalicylate (ex. Kayopectate, Pepto Bismol) every 30 minutes for up to 6 doses can help control diarrhea.  Avoid if pregnant. °- Loperamide (Immodium) can slow down diarrhea.  Start with two tablets (4mg total) first and then try one tablet every 6 hours.  Avoid if you are having fevers or severe pain.  If you are not better or start feeling worse, stop all medicines and call your doctor for advice °o Call your doctor if you are getting worse or not better.  Sometimes further testing (cultures, endoscopy, X-ray studies, bloodwork, etc) may be needed to help diagnose and treat  the cause of the diarrhea. ° °TROUBLESHOOTING IRREGULAR BOWELS °1) Avoid extremes of bowel movements (no bad constipation/diarrhea) °2) Miralax 17gm mixed in 8oz. water or juice-daily. May use BID as needed.  °3) Gas-x,Phazyme, etc. as needed for gas & bloating.  °4) Soft,bland diet. No spicy,greasy,fried foods.  °5) Prilosec over-the-counter as needed  °6) May hold   gluten/wheat products from diet to see if symptoms improve.  7)  May try probiotics (Align, Activa, etc) to help calm the bowels down 7) If symptoms become worse call back immediately.   4. Wash / shower every day.  You may shower over the incision / wound.  Avoid baths until 5 days after surgery.  Continue to shower over incision(s) after the dressing is off.  5. Remove your waterproof bandages 5 days after surgery.  You may leave the incision open to air.  Remove any wicks or ribbons in your wound.  If you have an open wound, please see wound care instructions. You may replace a dressing/Band-Aid to cover the incision for comfort if you wish.  6. ACTIVITIES as tolerated:   a. You may resume regular (light) daily activities beginning the next day--such as daily self-care, walking, climbing stairs--gradually increasing activities as tolerated.  If you can walk 30 minutes without difficulty, it is safe to try more intense activity such as jogging, treadmill, bicycling, low-impact aerobics, swimming, etc. b. Save the most intensive and strenuous activity for last (Usually 3-6 weeks after surgery) such as sit-ups, heavy lifting, contact sports, etc  Refrain from any heavy lifting or straining until you are off narcotics for pain control.   c. DO NOT PUSH THROUGH PAIN.  Let pain be your guide: If it hurts to do something, don't do it.  Pain is your body warning you to avoid that activity for another week until the pain goes down. d. You may drive when you are no longer taking prescription pain medication, you can comfortably wear a seatbelt, and  you can safely maneuver your car and apply brakes. e. Dennis Bast may have sexual intercourse when it is comfortable. If it hurts to do something, don't do it.  7. FOLLOW UP in our office a. Please call [facility] at [phone] to set up an appointment to see your surgeon in the office for a follow-up appointment approximately 2-3 weeks after your surgery. b. Make sure that you call for this appointment the day you arrive home to insure a convenient appointment time.  8. IF YOU HAVE DISABILITY OR FAMILY LEAVE FORMS, BRING THEM TO THE OFFICE FOR PROCESSING.  DO NOT GIVE THEM TO YOUR DOCTOR.   WHEN TO CALL us [phone]: 1. Poor pain control 2. Reactions / problems with new medications (rash/itching, nausea, etc)  3. Fever over 101.5 F (38.5 C) 4. Inability to urinate 5. Nausea and/or vomiting 6. Worsening swelling or bruising 7. Continued bleeding from incision. 8. Increased pain, redness, or drainage from the incision  The clinic staff is available to answer your questions during regular business hours (8:30am-5pm).  Please dont hesitate to call and ask to speak to one of our nurses for clinical concerns.   A surgeon from [facility] is always on call at the hospitals   If you have a medical emergency, go to the nearest emergency room or call 911.    [facility], [place], [place], [place], [place]  [address] ? MAIN: [phone] ? TOLL FREE: [phone] ? FAX [phone] [email or web address]  GETTING TO GOOD BOWEL HEALTH. Irregular bowel habits such as constipation and diarrhea can lead to many problems over time.  Having one soft bowel movement a day is the most important way to prevent further problems.  The anorectal canal is designed to handle stretching and feces to safely manage our ability to get rid of solid waste (  feces, poop, stool) out of our body.  BUT, hard constipated stools can act like ripping concrete bricks and  diarrhea can be a burning fire to this very sensitive area of our body, causing inflamed hemorrhoids, anal fissures, increasing risk is perirectal abscesses, abdominal pain/bloating, an making irritable bowel worse.      The goal: ONE SOFT BOWEL MOVEMENT A DAY!  To have soft, regular bowel movements:   Drink plenty of fluids, consider 4-6 tall glasses of water a day.    Take plenty of fiber.  Fiber is the undigested part of plant food that passes into the colon, acting s natures broom to encourage bowel motility and movement.  Fiber can absorb and hold large amounts of water. This results in a larger, bulkier stool, which is soft and easier to pass. Work gradually over several weeks up to 6 servings a day of fiber (25g a day even more if needed) in the form of: o Vegetables -- Root (potatoes, carrots, turnips), leafy green (lettuce, salad greens, celery, spinach), or cooked high residue (cabbage, broccoli, etc) o Fruit -- Fresh (unpeeled skin & pulp), Dried (prunes, apricots, cherries, etc ),  or stewed ( applesauce)  o Whole grain breads, pasta, etc (whole wheat)  o Bran cereals   Bulking Agents -- This type of water-retaining fiber generally is easily obtained each day by one of the following:  o Psyllium bran -- The psyllium plant is remarkable because its ground seeds can retain so much water. This product is available as Metamucil, Konsyl, Effersyllium, Per Diem Fiber, or the less expensive generic preparation in drug and health food stores. Although labeled a laxative, it really is not a laxative.  o Methylcellulose -- This is another fiber derived from wood which also retains water. It is available as Citrucel. o Polyethylene Glycol - and artificial fiber commonly called Miralax or Glycolax.  It is helpful for people with gassy or bloated feelings with regular fiber o Flax Seed - a less gassy fiber than psyllium  No reading or other relaxing activity while on the toilet. If bowel  movements take longer than 5 minutes, you are too constipated  AVOID CONSTIPATION.  High fiber and water intake usually takes care of this.  Sometimes a laxative is needed to stimulate more frequent bowel movements, but   Laxatives are not a good long-term solution as it can wear the colon out.  They can help jump-start bowels if constipated, but should be relied on constantly without discussing with your doctor o Osmotics (Milk of Magnesia, Fleets phosphosoda, Magnesium citrate, MiraLax, GoLytely) are safer than  o Stimulants (Senokot, Castor Oil, Dulcolax, Ex Lax)    o Avoid taking laxatives for more than 7 days in a row.   IF SEVERELY CONSTIPATED, try a Bowel Retraining Program: o Do not use laxatives.  o Eat a diet high in roughage, such as bran cereals and leafy vegetables.  o Drink six (6) ounces of prune or apricot juice each morning.  o Eat two (2) large servings of stewed fruit each day.  o Take one (1) heaping tablespoon of a psyllium-based bulking agent twice a day. Use sugar-free sweetener when possible to avoid excessive calories.  o Eat a normal breakfast.  o Set aside 15 minutes after breakfast to sit on the toilet, but do not strain to have a bowel movement.  o If you do not have a bowel movement by the third day, use an enema and repeat the above steps.  Controlling diarrhea o Switch to liquids and simpler foods for a few days to avoid stressing your intestines further. o Avoid dairy products (especially milk & ice cream) for a short time.  The intestines often can lose the ability to digest lactose when stressed. o Avoid foods that cause gassiness or bloating.  Typical foods include beans and other legumes, cabbage, broccoli, and dairy foods.  Every person has some sensitivity to other foods, so listen to our body and avoid those foods that trigger problems for you. o Adding fiber (Citrucel, Metamucil, psyllium, Miralax) gradually can help thicken stools by absorbing excess  fluid and retrain the intestines to act more normally.  Slowly increase the dose over a few weeks.  Too much fiber too soon can backfire and cause cramping & bloating. o Probiotics (such as active yogurt, Align, etc) may help repopulate the intestines and colon with normal bacteria and calm down a sensitive digestive tract.  Most studies show it to be of mild help, though, and such products can be costly. o Medicines: - Bismuth subsalicylate (ex. Kayopectate, Pepto Bismol) every 30 minutes for up to 6 doses can help control diarrhea.  Avoid if pregnant. - Loperamide (Immodium) can slow down diarrhea.  Start with two tablets (4mg  total) first and then try one tablet every 6 hours.  Avoid if you are having fevers or severe pain.  If you are not better or start feeling worse, stop all medicines and call your doctor for advice o Call your doctor if you are getting worse or not better.  Sometimes further testing (cultures, endoscopy, X-ray studies, bloodwork, etc) may be needed to help diagnose and treat the cause of the diarrhea.  TROUBLESHOOTING IRREGULAR BOWELS 1) Avoid extremes of bowel movements (no bad constipation/diarrhea) 2) Miralax 17gm mixed in 8oz. water or juice-daily. May use BID as needed.  3) Gas-x,Phazyme, etc. as needed for gas & bloating.  4) Soft,bland diet. No spicy,greasy,fried foods.  5) Prilosec over-the-counter as needed  6) May hold gluten/wheat products from diet to see if symptoms improve.  7)  May try probiotics (Align, Activa, etc) to help calm the bowels down 7) If symptoms become worse call back immediately.  PORT-A-CATHETER PLACEMENT:  INSTRUCTIONS AFTER SURGERY  DIET: Follow a light bland diet the first night after arrival home, such as soup, liquids, crackers, etc.  Be sure to include lots of fluids daily.  Avoid fast food or heavy meals as your are more likely to get nauseated.    Take your usually prescribed home medications unless otherwise directed.  PAIN  CONTROL: Pain is best controlled by a usual combination of three different methods TOGETHER: Ice/Heat Over the counter acetaminophen pain medication Prescription pain medication  Most patients will experience some swelling and bruising around the incisions.  Ice packs or heating pads (30-60 minutes up to 6 times a day) will help. Use ice for the first few days to help decrease swelling and bruising, then switch to heat to help relax tight/sore spots and speed recovery.  Some people prefer to use ice alone, heat alone, alternating between ice & heat.  Experiment to what works for you.  Swelling and bruising can take several weeks to resolve.    It is helpful to take an over-the-counter pain medication regularly for the first few weeks.  Using acetaminophen (Tylenol, etc) 500-650mg  four times a day (every meal & bedtime) is usually safest since NSAIDs like ibuprofen are not advisable in patients with kidney disease.  A  prescription for pain medication (such as oxycodone, hydrocodone, etc) may be given to you upon discharge.  Take your pain medication as prescribed.   If you are having problems/concerns with the prescription medicine (does not control pain, nausea, vomiting, rash, itching, etc), please call us [phone] to see if we need to switch you to a different pain medicine that will work better for you and/or control your side effect better. If you need a refill on your pain medication, please contact your pharmacy.  They will contact our office to request authorization. Prescriptions will not be filled after 5 pm or on week-ends.  Avoid getting constipated.   Between the surgery and the pain medications, it is common to experience some constipation.  Increasing fluid intake and taking a fiber supplement (such as Metamucil, Citrucel, FiberCon, MiraLax, etc) 1-2 times a day regularly will usually help prevent this problem from occurring.  A mild laxative (prune juice, Milk of Magnesia,  MiraLax, etc) should be taken according to package directions if there are no bowel movements after 48 hours.    Wash / shower every day.   You may shower over the dressings as they are waterproof.  Continue to shower over incision(s) after the dressing is off.  The Chemotherapy / Oncology nurse will remove your waterproof bandages in their office a few days after surgery.  Do not remove the bandages unless you are 5 days out from surgery  ACTIVITIES as tolerated:   You may resume regular (light) daily activities beginning the next day--such as daily self-care, walking, climbing stairs--gradually increasing activities as tolerated.  If you can walk 30 minutes without difficulty, it is safe to try more intense activity such as jogging, treadmill, bicycling, low-impact aerobics, swimming, etc. Save the most intensive and strenuous activity for last such as push-ups, heavy lifting, contact sports, etc  Refrain from any heavy lifting or straining until you are off narcotics for pain control.    DO NOT PUSH THROUGH PAIN.  Let pain be your guide: If it hurts to do something, don't do it.  Pain is your body warning you to avoid that activity for another week until the pain goes down. You may drive when you are no longer taking prescription pain medication, you can comfortably wear a seatbelt, and you can safely maneuver your car and apply brakes. You may have sexual intercourse when it is comfortable.   FOLLOW UP with the Oncology / Chemotherapy nurses closely after surgery. Please call [facility] at [phone] only as needed.  The infusion nurses & Oncology usually follow you closely, making the need for follow-up in our office redundant and therefore not needed.  If they or you have concerns, please call us for possible follow-up in our office 9. IF YOU HAVE DISABILITY OR FAMILY LEAVE FORMS, BRING THEM TO THE OFFICE FOR PROCESSING.  DO NOT GIVE THEM TO YOUR DOCTOR.   WHEN TO CALL us [phone]  [phone]: Poor pain control Reactions / problems with new medications (rash/itching, nausea, etc)  Fever over 101.5 F (38.5 C) Worsening swelling or bruising Continued bleeding from incision. Increased pain, redness, or drainage from the incision   The clinic staff is available to answer your questions during regular business hours (8:30am-5pm).  Please dont hesitate to call and ask to speak to one of our nurses for clinical concerns.   If you have a medical emergency, go to the nearest emergency room or call 911.  A surgeon from [facility] is  always on call at the [facility], [place], [place], [place],   [address] ? MAIN: [phone] ? TOLL FREE: [phone] ?  FAX [phone] [email or web address]   Managing Pain  Pain after surgery or related to activity is often due to strain/injury to muscle, tendon, nerves and/or incisions.  This pain is usually short-term and will improve in a few months.   Many people find it helpful to do the following things TOGETHER to help speed the process of healing and to get back to regular activity more quickly:  6. Avoid heavy physical activity at first a. No lifting greater than 20 pounds at first, then increase to lifting as tolerated over the next few weeks b. Do not push through the pain.  Listen to your body and avoid positions and maneuvers than reproduce the pain.  Wait a few days before trying something more intense c. Walking is okay as tolerated, but go slowly and stop when getting sore.  If you can walk 30 minutes without stopping or pain, you can try more intense activity (running, jogging, aerobics, cycling, swimming, treadmill, sex, sports, weightlifting, etc ) d. Remember: If it hurts to do it, then dont do it!  7. Take Anti-inflammatory medication a. Choose ONE of the following over-the-counter medications: i.            Acetaminophen 500mg  tabs  (Tylenol) 1-2 pills with every meal and just before bedtime (avoid if you have liver problems) ii.            Naproxen 220mg  tabs (ex. Aleve) 1-2 pills twice a day (avoid if you have kidney, stomach, IBD, or bleeding problems) iii. Ibuprofen 200mg  tabs (ex. Advil, Motrin) 3-4 pills with every meal and just before bedtime (avoid if you have kidney, stomach, IBD, or bleeding problems) b. Take with food/snack around the clock for 1-2 weeks i. This helps the muscle and nerve tissues become less irritable and calm down faster  8. Use a Heating pad or Ice/Cold Pack a. 4-6 times a day b. May use warm bath/hottub  or showers  9. Try Gentle Massage and/or Stretching  a. at the area of pain many times a day b. stop if you feel pain - do not overdo it  Try these steps together to help you body heal faster and avoid making things get worse.  Doing just one of these things may not be enough.    If you are not getting better after two weeks or are noticing you are getting worse, contact our office for further advice; we may need to re-evaluate you & see what other things we can do to help.  Colorectal Cancer Colorectal cancer is an abnormal growth of tissue (tumor) in the colon or rectum that is cancerous (malignant). Unlike noncancerous (benign) tumors, malignant tumors can spread to other parts of your body. The colon is the large bowel or large intestine. The rectum is the last several inches of the colon.  RISK FACTORS The exact cause of colorectal cancer is unknown. However, the following factors may increase your chances of getting colorectal cancer:   Age older than 42 years.   Abnormal growths (polyps) on the inner wall of the colon or rectum.   Diabetes.   African American race.   Family history of hereditary nonpolyposis colorectal cancer. This condition is caused by changes in the genes that are responsible for repairing mismatched DNA.   Personal history of cancer. A  person who  has already had colorectal cancer may develop it a second time. Also, women with a history of ovarian, uterine, or breast cancer are at a somewhat higher risk of developing colorectal cancer.  Certain hereditary conditions.  Eating a diet that is high in fat (especially animal fat) and low in fiber, fruits, and vegetables.  Sedentary lifestyle.  Inflammatory bowel disease, including ulcerative colitis and Crohn's disease.   Smoking.   Excessive alcohol use.  SYMPTOMS Early colorectal cancer often does not cause symptoms. As the cancer grows, symptoms may include:   Changes in bowel habits.  Diarrhea.   Constipation.   Feeling like the bowel does not empty completely after a bowel movement.   Blood in the stool.   Stools that are narrower than usual.   Abdominal discomfort, pain, bloating, fullness, or cramps.  Frequent gas pain.   Unexplained weight loss.   Constant tiredness.   Nausea and vomiting.  DIAGNOSIS  Your health care provider will ask about your medical history. He or she may also perform a number of procedures, such as:   A physical exam.  A digital rectal exam.  A fecal occult blood test.  A barium enema.  Blood tests.   X-rays.   Imaging tests, such as CT scans or MRIs.   Taking a tissue sample (biopsy) from your colon or rectum to look for cancer cells.   A sigmoidoscopy to view the inside of the last part of your colon.   A colonoscopy to view the inside of your entire colon.   An endorectal ultrasound to see how deep a rectal tumor has grown and whether the cancer has spread to lymph nodes or other nearby tissues.  Your cancer will be staged to determine its severity and extent. Staging is a careful attempt to find out the size of the tumor, whether the cancer has spread, and if so, to what parts of the body. You may need to have more tests to determine the stage of your cancer. The test results will help determine  what treatment plan is best for you.   Stage 0. The cancer is found only in the innermost lining of the colon or rectum.   Stage I. The cancer has grown into the inner wall of the colon or rectum. The cancer has not yet reached the outer wall of the colon.   Stage II. The cancer extends more deeply into or through the wall of the colon or rectum. It may have invaded nearby tissue, but cancer cells have not spread to the lymph nodes.   Stage III. The cancer has spread to nearby lymph nodes but not to other parts of the body.   Stage IV. The cancer has spread to other parts of the body, such as the liver or lungs.  Your health care provider may tell you the detailed stage of your cancer, which includes both a number and a letter.  TREATMENT  Depending on the type and stage, colorectal cancer may be treated with surgery, radiation therapy, chemotherapy, targeted therapy, or radiofrequency ablation. Some people have a combination of these therapies. Surgery may be done to remove the polyps from your colon. In early stages, your health care provider may be able to do this during a colonoscopy. In later stages, surgery may be done to remove part of your colon.  HOME CARE INSTRUCTIONS   Take medicines only as directed by your health care provider.   Maintain a healthy diet.  Consider joining a support group. This may help you learn to cope with the stress of having colorectal cancer.   Seek advice to help you manage treatment of side effects.   Keep all follow-up visits as directed by your health care provider.   Inform your cancer specialist if you are admitted to the hospital.  SEEK MEDICAL CARE IF:  Your diarrhea or constipation does not go away.   Your bowel habits change.  You have increased abdominal pain.   You notice new fatigue or weakness.  You lose weight. Document Released: 07/26/2005 Document Revised: 12/10/2013 Document Reviewed: 01/18/2013 [employer]  Patient Information 2015 [place], [place]. This information is not intended to replace advice given to you by your health care provider. Make sure you discuss any questions you have with your health care provider.

## 2015-04-08 NOTE — Transfer of Care (Signed)
Immediate Anesthesia Transfer of Care Note  Patient: Christina [name]  Procedure(s) Performed: Procedure(s) with comments: LAPAROSCOPIC  RESECTION OF PART OF  SIGMOID COLON, CONTAINING CANCER,. INSERTION OF PORT A CATH (N/A) CORE NEEDLE LIVER BIOPSY (N/A) INSERTION PORT-A-CATH (N/A) - right ij  Patient Location: PACU  Anesthesia Type:General  Level of Consciousness: awake  Airway & Oxygen Therapy: Patient Spontanous Breathing and Patient connected to face mask oxygen  Post-op Assessment: Report given to RN and Post -op Vital signs reviewed and stable  Post vital signs: Reviewed and stable  Last Vitals:  Filed Vitals:   04/08/15 1037  BP: 148/70  Pulse: 73  Temp: 36.8 C  Resp: 20    Complications: No apparent anesthesia complications

## 2015-04-08 NOTE — Progress Notes (Signed)
Labetalol 5 mg given IV- per Dr Ola Spurr

## 2015-04-08 NOTE — Anesthesia Preprocedure Evaluation (Addendum)
Anesthesia Evaluation  Patient identified by MRN, date of birth, ID band Patient awake    Reviewed: Allergy & Precautions, NPO status , Patient's Chart, lab work & pertinent test results  Airway Mallampati: II  TM Distance: >3 FB Neck ROM: Full    Dental  (+) Teeth Intact, Dental Advisory Given   Pulmonary neg pulmonary ROS,  breath sounds clear to auscultation        Cardiovascular hypertension, Pt. on medications Rhythm:Regular Rate:Normal     Neuro/Psych negative neurological ROS     GI/Hepatic Liver mets Colon CA with liver mets   Endo/Other  negative endocrine ROS  Renal/GU negative Renal ROS     Musculoskeletal negative musculoskeletal ROS (+)   Abdominal   Peds  Hematology  (+) anemia ,   Anesthesia Other Findings   Reproductive/Obstetrics                            CBC    Component Value Date/Time   WBC 7.3 04/08/2015 0553   RBC 5.01 04/08/2015 0553   RBC 5.49* 04/06/2015 1432   HGB 8.7* 04/08/2015 0553   HCT 30.2* 04/08/2015 0553   PLT 579* 04/08/2015 0553   MCV 60.3* 04/08/2015 0553   MCH 17.4* 04/08/2015 0553   MCHC 28.8* 04/08/2015 0553   RDW 29.7* 04/08/2015 0553   LYMPHSABS 1.5 04/08/2015 0553   MONOABS 0.7 04/08/2015 0553   EOSABS 0.4 04/08/2015 0553   BASOSABS 0.0 04/08/2015 0553    Lab Results  Component Value Date   CREATININE 0.80 04/07/2015   BUN <5* 04/07/2015   NA 143 04/07/2015   K 3.3* 04/07/2015   CL 107 04/07/2015   CO2 28 04/07/2015    Anesthesia Physical Anesthesia Plan  ASA: III  Anesthesia Plan: General   Post-op Pain Management:    Induction: Intravenous  Airway Management Planned: Oral ETT  Additional Equipment:   Intra-op Plan:   Post-operative Plan: Extubation in OR  Informed Consent: I have reviewed the patients History and Physical, chart, labs and discussed the procedure including the risks, benefits and alternatives  for the proposed anesthesia with the patient or authorized representative who has indicated his/her understanding and acceptance.   Dental advisory given  Plan Discussed with: CRNA  Anesthesia Plan Comments:         Anesthesia Quick Evaluation

## 2015-04-08 NOTE — Anesthesia Postprocedure Evaluation (Signed)
  Anesthesia Post-op Note  Patient: Christina [name]  Procedure(s) Performed: Procedure(s) with comments: LAPAROSCOPIC  RESECTION OF PART OF  SIGMOID COLON, CONTAINING CANCER,. INSERTION OF PORT A CATH (N/A) CORE NEEDLE LIVER BIOPSY (N/A) INSERTION PORT-A-CATH (N/A) - right ij  Patient Location: PACU  Anesthesia Type:General  Level of Consciousness: awake and alert   Airway and Oxygen Therapy: Patient Spontanous Breathing  Post-op Pain: mild  Post-op Assessment: Post-op Vital signs reviewed              Post-op Vital Signs: Reviewed  Last Vitals:  Filed Vitals:   04/08/15 1832  BP: 174/81  Pulse:   Temp: 36.5 C  Resp: 14    Complications: No apparent anesthesia complications

## 2015-04-09 ENCOUNTER — Encounter (HOSPITAL_COMMUNITY): Payer: Self-pay | Admitting: Surgery

## 2015-04-09 DIAGNOSIS — C189 Malignant neoplasm of colon, unspecified: Secondary | ICD-10-CM

## 2015-04-09 LAB — BASIC METABOLIC PANEL
Anion gap: 10 (ref 5–15)
CALCIUM: 8.9 mg/dL (ref 8.9–10.3)
CHLORIDE: 103 mmol/L (ref 101–111)
CO2: 25 mmol/L (ref 22–32)
CREATININE: 0.86 mg/dL (ref 0.44–1.00)
GFR calc Af Amer: >60 mL/min (ref >60)
GFR calc non Af Amer: >60 mL/min (ref >60)
Glucose, Bld: 107 mg/dL — ABNORMAL HIGH (ref 65–99)
Potassium: 3.6 mmol/L (ref 3.5–5.1)
SODIUM: 138 mmol/L (ref 135–145)

## 2015-04-09 LAB — HEMOGLOBIN A1C
Hgb A1c MFr Bld: 5.7 % — ABNORMAL HIGH (ref 4.8–5.6)
MEAN PLASMA GLUCOSE: 117 mg/dL

## 2015-04-09 LAB — CBC
HEMATOCRIT: 28.9 % — AB (ref 36.0–46.0)
HEMOGLOBIN: 8.4 g/dL — AB (ref 12.0–15.0)
MCH: 17.5 pg — AB (ref 26.0–34.0)
MCHC: 29.1 g/dL — AB (ref 30.0–36.0)
MCV: 60.1 fL — ABNORMAL LOW (ref 78.0–100.0)
Platelets: 575 10*3/uL — ABNORMAL HIGH (ref 150–400)
RBC: 4.81 MIL/uL (ref 3.87–5.11)
RDW: 29.8 % — AB (ref 11.5–15.5)
WBC: 11.5 10*3/uL — ABNORMAL HIGH (ref 4.0–10.5)

## 2015-04-09 LAB — MAGNESIUM: Magnesium: 1.6 mg/dL — ABNORMAL LOW (ref 1.7–2.4)

## 2015-04-09 MED ORDER — METOPROLOL TARTRATE 12.5 MG HALF TABLET
12.5000 mg | ORAL_TABLET | Freq: Two times a day (BID) | ORAL | Status: DC | PRN
  Filled 2015-04-09: qty 1

## 2015-04-09 MED ORDER — SODIUM CHLORIDE 0.9 % IV SOLN: 250.0000 mL | INTRAVENOUS | Status: DC | PRN

## 2015-04-09 MED ORDER — METOPROLOL TARTRATE 1 MG/ML IV SOLN
5.0000 mg | Freq: Four times a day (QID) | INTRAVENOUS | Status: DC | PRN
  Filled 2015-04-09: qty 5

## 2015-04-09 MED ORDER — SODIUM CHLORIDE 0.9 % IJ SOLN: 3.0000 mL | INTRAMUSCULAR | Status: DC | PRN

## 2015-04-09 MED ORDER — SODIUM CHLORIDE 0.9 % IJ SOLN: 3.0000 mL | Freq: Two times a day (BID) | INTRAMUSCULAR | Status: DC

## 2015-04-09 MED ORDER — LACTATED RINGERS IV BOLUS (SEPSIS): 1000.0000 mL | Freq: Three times a day (TID) | INTRAVENOUS | Status: AC | PRN

## 2015-04-09 NOTE — Progress Notes (Signed)
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 - s/p lap colectomy 04/08/2015 Active Problems:   Liver metastasis from colorectal cancer   Nausea & vomiting    Pelvic mass - 9cm - ?fibroid vs drop metastasis   Hypokalemia   Anemia   Hypertension   Thalassanemia   Metastatic colon cancer to liver   1 Day Post-Op  04/08/2015  POST-OPERATIVE DIAGNOSIS: Obstructing sigmoid colon cancer, liver mass  PROCEDURE:  LAPAROSCOPIC SIGMOID COLECTOMY Laparoscopic mobilization of the splenic flexure of the colon CORE NEEDLE LIVER BIOPSY RIGID PROCTOSCOPY INSERTION PORT-A-CATH  SURGEON: Surgeon(s): Michael Boston, MD Greer Pickerel, MD - Assist  Assessment  Recovering  Plan:  -adv diet -Pain control. -f/u pathology -VTE prophylaxis- SCDs, etc -mobilize as tolerated to help recovery  I updated the patient's status to the patient & the patient's spouse.  Recommendations were made.  Questions were answered.  They expressed understanding & appreciation.   [name], M.D., F.A.C.S. Gastrointestinal and Minimally Invasive Surgery [facility] [address], [place], [place] [address] [phone] Main / Paging   04/09/2015  Subjective:  Walking in hallways C/o gassiness - mild Old blood in 1st BM Tol liquids  Objective:  Vital signs:  Filed Vitals:   04/08/15 2135 04/08/15 2204 04/09/15 0135 04/09/15 0602  BP: 164/80  172/84 152/79  Pulse: 71  76 80  Temp: 98.1 F (36.7 C)  98.4 F (36.9 C) 98.7 F (37.1 C)  TempSrc: Axillary  Oral Oral  Resp: _0 Height:      Weight:  73.573 kg (162 lb 3.2 oz)    SpO2: 100%  100% 100%    Last BM Date: 04/08/15  Intake/Output   Yesterday:  08/30 0701 -  08/31 0700 In: 3604.8 [P.O.:480; I.V.:3024.8; IV Piggyback:100] Out: 1502 [Urine:1376; Stool:1; Blood:125] This shift:     Bowel function:  Flatus: n  BM: small  Drain: n/a  Physical Exam:  General: Pt awake/alert/oriented x4 in no acute distress Eyes: PERRL, normal EOM.  Sclera clear.  No icterus Neuro: CN II-XII intact w/o focal sensory/motor deficits. Lymph: No head/neck/groin lymphadenopathy Psych:  No delerium/psychosis/paranoia HENT: Normocephalic, Mucus membranes moist.  No thrush Neck: Supple, No tracheal deviation Chest: No chest wall pain w good excursion CV:  Pulses intact.  Regular rhythm MS: Normal AROM mjr joints.  No obvious deformity Abdomen: Soft.  Nondistended.  Mildly tender at upper abdomen.  Pfannenstiel incision w/o pain.  No evidence of peritonitis.  No incarcerated hernias. Ext:  SCDs BLE.  No mjr edema.  No cyanosis Skin: No petechiae / purpura  Results:   Labs: Results for orders placed or performed during the hospital encounter of 04/06/15 (from the past 48 hour(s))  Protime-INR     Status: None   Collection Time: 04/07/15 11:00 AM  Result Value Ref Range   Prothrombin Time 15.1 11.6 - 15.2 seconds   INR 1.17 0.00 - 1.49  APTT     Status: None   Collection Time: 04/07/15 11:00 AM  Result Value Ref Range   aPTT 32 24 - 37 seconds  Surgical pcr screen     Status: None   Collection Time: 04/08/15 12:57 AM  Result Value Ref Range   MRSA, PCR NEGATIVE NEGATIVE  Staphylococcus aureus NEGATIVE NEGATIVE    Comment:        The Xpert SA Assay (FDA approved for NASAL specimens in patients over 2 years of age), is one component of a comprehensive surveillance program.  Test performance has been validated by [facility] for patients greater than or equal to 67 year old. It is not intended to diagnose infection nor to guide or monitor treatment.   Hemoglobin A1c     Status: Abnormal   Collection Time: 04/08/15  5:53 AM  Result Value Ref Range    Hgb A1c MFr Bld 5.7 (H) 4.8 - 5.6 %    Comment: (NOTE)         Pre-diabetes: 5.7 - 6.4         Diabetes: >6.4         Glycemic control for adults with diabetes: <7.0    Mean Plasma Glucose 117 mg/dL    Comment: (NOTE) Performed At: [facility] [place], [place] [id number] Lindon Romp MD IR:[id number]   Protime-INR     Status: Abnormal   Collection Time: 04/08/15  5:53 AM  Result Value Ref Range   Prothrombin Time 15.9 (H) 11.6 - 15.2 seconds   INR 1.26 0.00 - 1.49  CBC WITH DIFFERENTIAL     Status: Abnormal   Collection Time: 04/08/15  5:53 AM  Result Value Ref Range   WBC 7.3 4.0 - 10.5 K/uL   RBC 5.01 3.87 - 5.11 MIL/uL   Hemoglobin 8.7 (L) 12.0 - 15.0 g/dL   HCT 30.2 (L) 36.0 - 46.0 %   MCV 60.3 (L) 78.0 - 100.0 fL   MCH 17.4 (L) 26.0 - 34.0 pg   MCHC 28.8 (L) 30.0 - 36.0 g/dL   RDW 29.7 (H) 11.5 - 15.5 %   Platelets 579 (H) 150 - 400 K/uL    Comment: LARGE PLATELETS PRESENT PLATELET COUNT CONFIRMED BY SMEAR    Neutrophils Relative % 65 43 - 77 %   Lymphocytes Relative 20 12 - 46 %   Monocytes Relative 10 3 - 12 %   Eosinophils Relative 5 0 - 5 %   Basophils Relative 0 0 - 1 %   Neutro Abs 4.7 1.7 - 7.7 K/uL   Lymphs Abs 1.5 0.7 - 4.0 K/uL   Monocytes Absolute 0.7 0.1 - 1.0 K/uL   Eosinophils Absolute 0.4 0.0 - 0.7 K/uL   Basophils Absolute 0.0 0.0 - 0.1 K/uL   RBC Morphology POLYCHROMASIA PRESENT     Comment: TARGET CELLS TEARDROP CELLS   Type and screen     Status: None (Preliminary result)   Collection Time: 04/08/15 11:18 AM  Result Value Ref Range   ABO/RH(D) O NEG    Antibody Screen NEG    Sample Expiration 04/11/2015    Unit Number [id number]    Blood Component Type RED CELLS,LR    Unit division 00    Status of Unit ALLOCATED    Transfusion Status OK TO TRANSFUSE    Crossmatch Result Compatible    Unit Number [id number]    Blood Component Type RBC LR PHER2    Unit division 00    Status of Unit ALLOCATED     Transfusion Status OK TO TRANSFUSE    Crossmatch Result Compatible   ABO/Rh     Status: None   Collection Time: 04/08/15 11:24 AM  Result Value Ref Range   ABO/RH(D) O NEG   CBC     Status: Abnormal  Collection Time: 04/09/15  6:00 AM  Result Value Ref Range   WBC 11.5 (H) 4.0 - 10.5 K/uL   RBC 4.81 3.87 - 5.11 MIL/uL   Hemoglobin 8.4 (L) 12.0 - 15.0 g/dL   HCT 28.9 (L) 36.0 - 46.0 %   MCV 60.1 (L) 78.0 - 100.0 fL   MCH 17.5 (L) 26.0 - 34.0 pg   MCHC 29.1 (L) 30.0 - 36.0 g/dL   RDW 29.8 (H) 11.5 - 15.5 %   Platelets 575 (H) 150 - 400 K/uL    Comment: LARGE PLATELETS PRESENT PLATELET COUNT CONFIRMED BY SMEAR   Basic metabolic panel     Status: Abnormal   Collection Time: 04/09/15  6:00 AM  Result Value Ref Range   Sodium 138 135 - 145 mmol/L   Potassium 3.6 3.5 - 5.1 mmol/L   Chloride 103 101 - 111 mmol/L   CO2 25 22 - 32 mmol/L   Glucose, Bld 107 (H) 65 - 99 mg/dL   BUN <5 (L) 6 - 20 mg/dL   Creatinine, Ser 0.86 0.44 - 1.00 mg/dL   Calcium 8.9 8.9 - 10.3 mg/dL   GFR calc non Af Amer >60 >60 mL/min   GFR calc Af Amer >60 >60 mL/min    Comment: (NOTE) The eGFR has been calculated using the CKD EPI equation. This calculation has not been validated in all clinical situations. eGFR's persistently <60 mL/min signify possible Chronic Kidney Disease.    Anion gap 10 5 - 15  Magnesium     Status: Abnormal   Collection Time: 04/09/15  6:00 AM  Result Value Ref Range   Magnesium 1.6 (L) 1.7 - 2.4 mg/dL    Imaging / Studies: Dg Chest Port 1 View  04/08/2015   CLINICAL DATA:  New Port-A-Cath insertion.  EXAM: DG C-ARM 1-60 MIN-NO REPORT; PORTABLE CHEST - 1 VIEW  COMPARISON:  CT 04/06/2015  FINDINGS: Right anterior chest wall Port-A-Cath is present with tip projecting over the superior cavoatrial junction. Enlarged cardiac and mediastinal contours. No consolidative pulmonary opacities. Nipple shadow projects over the left lung base. Gas within the soft tissues overlying right  chest wall. No large pneumothorax.  IMPRESSION: Right anterior chest wall Port-A-Cath tip projects over the superior cavoatrial junction.  No large pneumothorax.   Electronically Signed   By: Lovey Newcomer M.D.   On: 04/08/2015 18:20   Dg C-arm 1-60 Min-no Report  04/08/2015   CLINICAL DATA: port placement   C-ARM 1-60 MINUTES  Fluoroscopy was utilized by the requesting physician.  No radiographic  interpretation.     Medications / Allergies: per chart  Antibiotics: Anti-infectives    Start     Dose/Rate Route Frequency Ordered Stop   04/09/15 0130  cefoTEtan (CEFOTAN) 2 g in dextrose 5 % 50 mL IVPB     2 g 100 mL/hr over 30 Minutes Intravenous Every 12 hours 04/08/15 1859 04/09/15 0142   04/08/15 1620  clindamycin (CLEOCIN) 900 mg, gentamicin (GARAMYCIN) 240 mg in sodium chloride 0.9 % 1,000 mL for intraperitoneal lavage  Status:  Discontinued       As needed 04/08/15 1621 04/08/15 1736   04/08/15 0800  clindamycin (CLEOCIN) 900 mg, gentamicin (GARAMYCIN) 240 mg in sodium chloride 0.9 % 1,000 mL for intraperitoneal lavage    Comments:  Pharmacy may adjust dosing strength, schedule, rate of infusion, etc as needed to optimize therapy    Intraperitoneal To Surgery 04/07/15 1128 04/09/15 0800   04/08/15 0600  cefoTEtan (CEFOTAN) 2 g  in dextrose 5 % 50 mL IVPB     2 g 100 mL/hr over 30 Minutes Intravenous On call to O.R. 04/07/15 1128 04/08/15 1330   04/07/15 1300  neomycin (MYCIFRADIN) tablet 1,000 mg     1,000 mg Oral 3 times per day on Mon 04/07/15 1128 04/07/15 2208   04/07/15 1300  metroNIDAZOLE (FLAGYL) tablet 1,000 mg    Comments:  Take 2 pills (=1075m) by mouth at 1pm, 3pm, and 10pm the day before your colorectal operation   1,000 mg Oral 3 times per day on Mon 04/07/15 1128 04/07/15 2208        Note: Portions of this report may have been transcribed using voice recognition software. Every effort was made to ensure accuracy; however, inadvertent computerized transcription errors  may be present.   Any transcriptional errors that result from this process are unintentional.     SAdin Hector M.D., F.A.C.S. Gastrointestinal and Minimally Invasive Surgery [facility] [address] [place] [phone] / Paging   04/09/2015  CARE TEAM:  PCP: OBenito Mccreedy MD  Outpatient Care Team: Patient Care Team: GBenito Mccreedy MD as PCP - General (Internal Medicine) JJuanita Craver MD as Consulting Physician (Gastroenterology) SMichael Boston MD as Consulting Physician (General Surgery) YTruitt Merle MD as Consulting Physician (Oncology)  Inpatient Treatment Team: Treatment Team: Attending Provider: RMendel Corning MD; Consulting Physician: MNolon Nations MD; Consulting Physician: JJuanita Craver MD; Technician: SGuadalupe Maple NHaw[place] Consulting Physician: YTruitt Merle MD; Registered Nurse: PRoe Rutherford RN; Rounding Team: WThreasa Beards MD; Technician: CMertha Baars NT; Registered Nurse: KLavell Luster RN; Registered Nurse: IBertrum Sol RN; Technician: BLeda Quail NT

## 2015-04-09 NOTE — Progress Notes (Signed)
[facility]                                                                              Patient Demographics  Christina [name], is a 54 y.o. female, DOB - 04/19/1960, XBD:[id number]  Admit date - 04/06/2015   Admitting Physician Ripudeep Krystal Eaton, MD  Outpatient Primary MD for the patient is [name],[name], MD  LOS - 3   Chief Complaint  Patient presents with  . Emesis       Brief HPI    Patient is a 54 year old female with hypertension, endometriosis, chronic anemia presented to ED with complaints of intractable nausea and vomiting that started last night. History was obtained from the patient and her husband in the room. Patient reported that she has been having rectal bleeding off and on for last 3 months. Patient was seen by gastroenterology, Dr. Collene Mares, underwent colonoscopy on 8/26 and Dr. Collene Mares was unable to pass the scope through the large nearly obstructing mass. Dr. Collene Mares had contacted surgery, biopsies were obtained of the mass, patient was scheduled to have CT scan of the abdomen on 8/29. However overnight patient started having several episodes of nausea and vomiting and presented to [facility] after the recommendations from Dr. Carlean Purl for further workup. At the time of my examination, patient complains of left lower quadrant abdominal pain, 6/10, not radiating, associated with nausea. Currently no vomiting, fevers or chills. Patient reports that she had loss of appetite in the last 1 week and may have lost about 5 pounds. She has a history of colon cancer in one of her cousins, otherwise not in her immediate family and siblings. ED workup showed sodium of 139, potassium 3.2, creatinine 0.9 every BC 7.7 hemoglobin 9.6, platelets 735. Per Dr. Collene Mares, patient had received 2 units of packed RBC transfusion last week in [place] for the hemoglobin of 6.2, confirmed from the patient. CT of the abdomen/pelvis showed several enhancing masses in the liver consistent  with liver metastases, masses in the pelvis likely of uterine origin, 9 cm sigmoid colon mass.  Oncology, general surgery was consulted. Patient underwent left breast upper colectomy, intraoperative liver biopsy and Port-A-Cath placement on 8/30.  Assessment & Plan   Principal Problem:  Nausea & vomiting with colon mass newly diagnosed with liver metastasis: Postop day #1 - patient had the flexible sigmoidoscopy on 8/26 and had large nearly obstructing mass ~10cm in the rectosigmoid colon. Colon biopsies are still pending - CEA elevated at 467,CA-19-9 also elevated at 1605, CA-125 normal - Underwent lap colectomy, intraoperative liver biopsy and port placement - Postop management by surgery  Active Problems:  Pelvic mass likely due to uterine fibroid  - Pelvic ultrasound showed mildly enlarged uterus with at least one fibroid and large 7 cm mass seen on CT is not well demonstrated sonographically    Iron deficiency anemia - Ferritin 8, s/p 1 dose of IV Feraheme 8/29  essential hypertension Continue nifedipine  Code Status: Full code  Family Communication: Discussed in detail with the patient, all imaging results, lab results explained to the patient and her husband at the bedside   Disposition Plan: Not  medically ready  time Spent in minutes 25 minutes  Procedures  CT abdomen and pelvis Pelvic ultrasound and transvaginal ultrasound  Consults  Interventional radiology   Gen. Surgery Oncology GI  DVT Prophylaxis  Lovenox  Medications  Scheduled Meds: . acetaminophen  1,000 mg Oral TID  . alvimopan  12 mg Oral BID  . enoxaparin (LOVENOX) injection  40 mg Subcutaneous Q24H  . lip balm  1 application Topical BID  . NIFEdipine  30 mg Oral Daily  . saccharomyces boulardii  250 mg Oral BID  . sodium chloride  3 mL Intravenous Q12H  . sodium chloride  3 mL Intravenous Q12H   Continuous Infusions:   PRN Meds:.sodium chloride, alum & mag hydroxide-simeth,  diphenhydrAMINE **OR** diphenhydrAMINE, HYDROmorphone (DILAUDID) injection, lactated ringers, metoprolol, metoprolol tartrate, ondansetron **OR** ondansetron (ZOFRAN) IV, promethazine, sodium chloride   Antibiotics   Anti-infectives    Start     Dose/Rate Route Frequency Ordered Stop   04/09/15 0130  cefoTEtan (CEFOTAN) 2 g in dextrose 5 % 50 mL IVPB     2 g 100 mL/hr over 30 Minutes Intravenous Every 12 hours 04/08/15 1859 04/09/15 0142   04/08/15 1620  clindamycin (CLEOCIN) 900 mg, gentamicin (GARAMYCIN) 240 mg in sodium chloride 0.9 % 1,000 mL for intraperitoneal lavage  Status:  Discontinued       As needed 04/08/15 1621 04/08/15 1736   04/08/15 0800  clindamycin (CLEOCIN) 900 mg, gentamicin (GARAMYCIN) 240 mg in sodium chloride 0.9 % 1,000 mL for intraperitoneal lavage  Status:  Discontinued    Comments:  Pharmacy may adjust dosing strength, schedule, rate of infusion, etc as needed to optimize therapy    Intraperitoneal To Surgery 04/07/15 1128 04/09/15 0944   04/08/15 0600  cefoTEtan (CEFOTAN) 2 g in dextrose 5 % 50 mL IVPB     2 g 100 mL/hr over 30 Minutes Intravenous On call to O.R. 04/07/15 1128 04/08/15 1330   04/07/15 1300  neomycin (MYCIFRADIN) tablet 1,000 mg     1,000 mg Oral 3 times per day on Mon 04/07/15 1128 04/07/15 2208   04/07/15 1300  metroNIDAZOLE (FLAGYL) tablet 1,000 mg    Comments:  Take 2 pills (=1000mg ) by mouth at 1pm, 3pm, and 10pm the day before your colorectal operation   1,000 mg Oral 3 times per day on Mon 04/07/15 1128 04/07/15 2208        Subjective:   Tam Savoia was seen and examined today. Doing well, ambulating in the hallways, no nausea or vomiting, no BM yet. Patient denies dizziness, chest pain, shortness of breath,  new weakness, numbess, tingling. No acute events overnight.    Objective:   Blood pressure 131/74, pulse 80, temperature 98 F (36.7 C), temperature source Oral, resp. rate 20, height 5\' 6"  (1.676 m), weight 73.573 kg  (162 lb 3.2 oz), SpO2 100 %.  Wt Readings from Last 3 Encounters:  04/08/15 73.573 kg (162 lb 3.2 oz)     Intake/Output Summary (Last 24 hours) at 04/09/15 1337 Last data filed at 04/09/15 1000  Gross per 24 hour  Intake 3604.75 ml  Output   2300 ml  Net 1304.75 ml    Exam  General: Alert and oriented x 3, NAD  HEENT:  PERRLA, EOMI,   Neck: Supple, no JVD, no masses  CVS: S1 S2 clear, RRR  Respiratory: CTAB  Abdomen: Soft, hypoactive bowel sounds  Ext: no cyanosis clubbing or edema  Neuro: no new deficits  Skin: No rashes  Psych: Normal  affect and demeanor, alert and oriented x3    Data Review   Micro Results Recent Results (from the past 240 hour(s))  Surgical pcr screen     Status: None   Collection Time: 04/08/15 12:57 AM  Result Value Ref Range Status   MRSA, PCR NEGATIVE NEGATIVE Final   Staphylococcus aureus NEGATIVE NEGATIVE Final    Comment:        The Xpert SA Assay (FDA approved for NASAL specimens in patients over 77 years of age), is one component of a comprehensive surveillance program.  Test performance has been validated by [facility] for patients greater than or equal to 83 year old. It is not intended to diagnose infection nor to guide or monitor treatment.     Radiology Reports Ct Chest W Contrast  04/06/2015   ADDENDUM REPORT: 04/06/2015 13:18  ADDENDUM: Upon further evaluation, The masslike lesion in the pelvis previously question matted small bowel loops represent 9 cm sigmoid colon cancer.   Electronically Signed   By: Abelardo Diesel M.D.   On: 04/06/2015 13:18   04/06/2015   CLINICAL DATA:  Vomiting.  EXAM: CT CHEST, ABDOMEN, AND PELVIS WITH CONTRAST  TECHNIQUE: Multidetector CT imaging of the chest, abdomen and pelvis was performed following the standard protocol during bolus administration of intravenous contrast.  CONTRAST:  69mL OMNIPAQUE IOHEXOL 300 MG/ML SOLN, 178mL OMNIPAQUE IOHEXOL 300 MG/ML SOLN  COMPARISON:  None.   FINDINGS: CT CHEST FINDINGS  Mediastinum/Nodes: There is no mediastinal or hilar lymphadenopathy. There is no mediastinal hematoma. The heart size is normal. There is no pericardial effusion.  Lungs/Pleura: There is no pulmonary mass or nodule. No focal pneumonia or pleural effusion is identified.  Musculoskeletal: No acute abnormality is identified within the visualized bones.  CT ABDOMEN PELVIS FINDINGS  Hepatobiliary: There is a 9.1 x 6.1 cm heterogeneously enhancing mass in the left lobe liver. There is a 0.9 x 1.6 cm mild enhancing mass in the dome of the liver. There is a 1.9 x 1.6 cm enhancing mass at the junction of caudate and right lobe liver. A 3 mm low density lesion is identified in the inferior left lobe liver. The gallbladder is normal.  Pancreas: Normal.  Spleen: Normal.  Adrenals/Urinary Tract: The adrenal glands are normal. They kidneys are normal. There is no nephrolithiasis or hydroureteronephrosis bilaterally.  Stomach/Bowel: There is a question 5.8 cm conglomerate of matted bowel loops in the mid lower abdomen. There is no small bowel obstruction. The colon is normal.  Vascular/Lymphatic: The aorta is normal. There is no aneurysmal dilatation of the aorta. There is no abdominal lymphadenopathy.  Reproductive: There is a 6 x 6.9 cm heterogeneously enhancing mass question arising from the uterus. A second mass, inseparable from the uterus that is lower density than adjacent uterine myometrium measures 5.7 x 7.8 cm.  Other: None.  Musculoskeletal: No focal discrete lytic or blastic lesion is identified within the visualized bones.  IMPRESSION: No acute abnormality identified within the chest. No evidence of metastatic disease.  Several enhancing masses in the liver consistent with liver metastasis.  Masses within the pelvis likely of the Uterine origin. Given the findings within the liver, malignant neoplasm is not excluded.  A conglomerate of matted bowel loops in the mid lower abdomen. There is  no evidence of small bowel obstruction.  Electronically Signed: By: Abelardo Diesel M.D. On: 04/06/2015 11:49   US Transvaginal Non-ob  04/06/2015   CLINICAL DATA:  Followup CT to evaluate possible uterine masses.  History of uterine fibroids, endometriosis.  EXAM: TRANSABDOMINAL AND TRANSVAGINAL ULTRASOUND OF PELVIS  TECHNIQUE: Both transabdominal and transvaginal ultrasound examinations of the pelvis were performed. Transabdominal technique was performed for global imaging of the pelvis including uterus, ovaries, adnexal regions, and pelvic cul-de-sac. It was necessary to proceed with endovaginal exam following the transabdominal exam to visualize the endometrium and ovaries.  COMPARISON:  CT today.  FINDINGS: Uterus  Measurements: 6.3 x 6.3 x 11.2 cm. There is at least 1 fibroid along the right side of the uterine body measuring 2.2 x 2.4 x 2.8 cm. Large mass seen on CT abutting the right-sided the lower uterus is not well demonstrated by ultrasound transabdominally nor endovaginally due to moderate adnexal bowel gas.  Endometrium  Thickness: 4 mm.  No focal abnormality visualized.  Right ovary  Measurements: Not visualized.  Left ovary  Measurements: Not visualized.  Other findings  No free fluid.  IMPRESSION: Mildly enlarged uterus with at least 1 fibroid over the right side of the uterine body measuring 2.8 cm. The large 7 cm mass seen on CT which appears to be arising from the right side of the lower uterine segment is not well demonstrated sonographically.  Nonvisualization of the ovaries.  No free fluid.   Electronically Signed   By: Marin Olp M.D.   On: 04/06/2015 21:05   US Pelvis Complete  04/06/2015   CLINICAL DATA:  Followup CT to evaluate possible uterine masses. History of uterine fibroids, endometriosis.  EXAM: TRANSABDOMINAL AND TRANSVAGINAL ULTRASOUND OF PELVIS  TECHNIQUE: Both transabdominal and transvaginal ultrasound examinations of the pelvis were performed. Transabdominal technique was  performed for global imaging of the pelvis including uterus, ovaries, adnexal regions, and pelvic cul-de-sac. It was necessary to proceed with endovaginal exam following the transabdominal exam to visualize the endometrium and ovaries.  COMPARISON:  CT today.  FINDINGS: Uterus  Measurements: 6.3 x 6.3 x 11.2 cm. There is at least 1 fibroid along the right side of the uterine body measuring 2.2 x 2.4 x 2.8 cm. Large mass seen on CT abutting the right-sided the lower uterus is not well demonstrated by ultrasound transabdominally nor endovaginally due to moderate adnexal bowel gas.  Endometrium  Thickness: 4 mm.  No focal abnormality visualized.  Right ovary  Measurements: Not visualized.  Left ovary  Measurements: Not visualized.  Other findings  No free fluid.  IMPRESSION: Mildly enlarged uterus with at least 1 fibroid over the right side of the uterine body measuring 2.8 cm. The large 7 cm mass seen on CT which appears to be arising from the right side of the lower uterine segment is not well demonstrated sonographically.  Nonvisualization of the ovaries.  No free fluid.   Electronically Signed   By: Marin Olp M.D.   On: 04/06/2015 21:05   Ct Abdomen Pelvis W Contrast  04/06/2015   ADDENDUM REPORT: 04/06/2015 13:18  ADDENDUM: Upon further evaluation, The masslike lesion in the pelvis previously question matted small bowel loops represent 9 cm sigmoid colon cancer.   Electronically Signed   By: Abelardo Diesel M.D.   On: 04/06/2015 13:18   04/06/2015   CLINICAL DATA:  Vomiting.  EXAM: CT CHEST, ABDOMEN, AND PELVIS WITH CONTRAST  TECHNIQUE: Multidetector CT imaging of the chest, abdomen and pelvis was performed following the standard protocol during bolus administration of intravenous contrast.  CONTRAST:  50mL OMNIPAQUE IOHEXOL 300 MG/ML SOLN, 155mL OMNIPAQUE IOHEXOL 300 MG/ML SOLN  COMPARISON:  None.  FINDINGS: CT CHEST FINDINGS  Mediastinum/Nodes: There is no mediastinal or hilar lymphadenopathy. There is no  mediastinal hematoma. The heart size is normal. There is no pericardial effusion.  Lungs/Pleura: There is no pulmonary mass or nodule. No focal pneumonia or pleural effusion is identified.  Musculoskeletal: No acute abnormality is identified within the visualized bones.  CT ABDOMEN PELVIS FINDINGS  Hepatobiliary: There is a 9.1 x 6.1 cm heterogeneously enhancing mass in the left lobe liver. There is a 0.9 x 1.6 cm mild enhancing mass in the dome of the liver. There is a 1.9 x 1.6 cm enhancing mass at the junction of caudate and right lobe liver. A 3 mm low density lesion is identified in the inferior left lobe liver. The gallbladder is normal.  Pancreas: Normal.  Spleen: Normal.  Adrenals/Urinary Tract: The adrenal glands are normal. They kidneys are normal. There is no nephrolithiasis or hydroureteronephrosis bilaterally.  Stomach/Bowel: There is a question 5.8 cm conglomerate of matted bowel loops in the mid lower abdomen. There is no small bowel obstruction. The colon is normal.  Vascular/Lymphatic: The aorta is normal. There is no aneurysmal dilatation of the aorta. There is no abdominal lymphadenopathy.  Reproductive: There is a 6 x 6.9 cm heterogeneously enhancing mass question arising from the uterus. A second mass, inseparable from the uterus that is lower density than adjacent uterine myometrium measures 5.7 x 7.8 cm.  Other: None.  Musculoskeletal: No focal discrete lytic or blastic lesion is identified within the visualized bones.  IMPRESSION: No acute abnormality identified within the chest. No evidence of metastatic disease.  Several enhancing masses in the liver consistent with liver metastasis.  Masses within the pelvis likely of the Uterine origin. Given the findings within the liver, malignant neoplasm is not excluded.  A conglomerate of matted bowel loops in the mid lower abdomen. There is no evidence of small bowel obstruction.  Electronically Signed: By: Abelardo Diesel M.D. On: 04/06/2015 11:49    Dg Chest Port 1 View  04/08/2015   CLINICAL DATA:  New Port-A-Cath insertion.  EXAM: DG C-ARM 1-60 MIN-NO REPORT; PORTABLE CHEST - 1 VIEW  COMPARISON:  CT 04/06/2015  FINDINGS: Right anterior chest wall Port-A-Cath is present with tip projecting over the superior cavoatrial junction. Enlarged cardiac and mediastinal contours. No consolidative pulmonary opacities. Nipple shadow projects over the left lung base. Gas within the soft tissues overlying right chest wall. No large pneumothorax.  IMPRESSION: Right anterior chest wall Port-A-Cath tip projects over the superior cavoatrial junction.  No large pneumothorax.   Electronically Signed   By: Lovey Newcomer M.D.   On: 04/08/2015 18:20   Dg C-arm 1-60 Min-no Report  04/08/2015   CLINICAL DATA: port placement   C-ARM 1-60 MINUTES  Fluoroscopy was utilized by the requesting physician.  No radiographic  interpretation.     CBC  Recent Labs Lab 04/06/15 0945 04/07/15 0537 04/08/15 0553 04/09/15 0600  WBC 7.7 7.2 7.3 11.5*  HGB 9.6* 8.9* 8.7* 8.4*  HCT 32.6* 30.9* 30.2* 28.9*  PLT 735* 597* 579* 575*  MCV 59.5* 60.2* 60.3* 60.1*  MCH 17.5* 17.3* 17.4* 17.5*  MCHC 29.4* 28.8* 28.8* 29.1*  RDW 29.4* 29.4* 29.7* 29.8*  LYMPHSABS 0.7  --  1.5  --   MONOABS 0.3  --  0.7  --   EOSABS 0.1  --  0.4  --   BASOSABS 0.0  --  0.0  --     Chemistries   Recent Labs Lab 04/06/15 0945 04/07/15 0537 04/09/15 0600  NA  139 143 138  K 3.2* 3.3* 3.6  CL 104 107 103  CO2 24 28 25   GLUCOSE 105* 91 107*  BUN 10 <5* <5*  CREATININE 0.90 0.80 0.86  CALCIUM 9.5 9.1 8.9  MG  --   --  1.6*  AST 22  --   --   ALT 10*  --   --   ALKPHOS 63  --   --   BILITOT 0.8  --   --    ------------------------------------------------------------------------------------------------------------------ estimated creatinine clearance is 75.8 mL/min (by C-G formula based on Cr of  0.86). ------------------------------------------------------------------------------------------------------------------  Recent Labs  04/08/15 0553  HGBA1C 5.7*   ------------------------------------------------------------------------------------------------------------------ No results for input(s): CHOL, HDL, LDLCALC, TRIG, CHOLHDL, LDLDIRECT in the last 72 hours. ------------------------------------------------------------------------------------------------------------------ No results for input(s): TSH, T4TOTAL, T3FREE, THYROIDAB in the last 72 hours.  Invalid input(s): FREET3 ------------------------------------------------------------------------------------------------------------------  Recent Labs  04/06/15 1432  VITAMINB12 636  FOLATE 32.7  FERRITIN 8*  TIBC 406  IRON 17*  RETICCTPCT RESULTS UNAVAILABLE DUE TO INTERFERING SUBSTANCE    Coagulation profile  Recent Labs Lab 04/07/15 1100 04/08/15 0553  INR 1.17 1.26    No results for input(s): DDIMER in the last 72 hours.  Cardiac Enzymes No results for input(s): CKMB, TROPONINI, MYOGLOBIN in the last 168 hours.  Invalid input(s): CK ------------------------------------------------------------------------------------------------------------------ Invalid input(s): POCBNP  No results for input(s): GLUCAP in the last 72 hours.   RAI,RIPUDEEP M.D. [facility] 04/09/2015, 1:37 PM  Pager: [phone] Between 7am to 7pm - call Pager - [phone]  After 7pm go to [email or web address] - password [email or web address]  Call night coverage person covering after 7pm

## 2015-04-10 LAB — CBC
HCT: 28.8 % — ABNORMAL LOW (ref 36.0–46.0)
Hemoglobin: 8.4 g/dL — ABNORMAL LOW (ref 12.0–15.0)
MCH: 17.8 pg — AB (ref 26.0–34.0)
MCHC: 29.2 g/dL — AB (ref 30.0–36.0)
MCV: 61 fL — ABNORMAL LOW (ref 78.0–100.0)
PLATELETS: 487 10*3/uL — AB (ref 150–400)
RBC: 4.72 MIL/uL (ref 3.87–5.11)
RDW: 30.5 % — AB (ref 11.5–15.5)
WBC: 11.5 10*3/uL — ABNORMAL HIGH (ref 4.0–10.5)

## 2015-04-10 LAB — BASIC METABOLIC PANEL
Anion gap: 6 (ref 5–15)
BUN: 8 mg/dL (ref 6–20)
CALCIUM: 8.8 mg/dL — AB (ref 8.9–10.3)
CO2: 29 mmol/L (ref 22–32)
CREATININE: 0.8 mg/dL (ref 0.44–1.00)
Chloride: 103 mmol/L (ref 101–111)
GFR calc Af Amer: >60 mL/min (ref >60)
Glucose, Bld: 96 mg/dL (ref 65–99)
POTASSIUM: 3.5 mmol/L (ref 3.5–5.1)
SODIUM: 138 mmol/L (ref 135–145)

## 2015-04-10 MED ORDER — OXYCODONE HCL 5 MG PO TABS
5.0000 mg | ORAL_TABLET | ORAL | Status: DC | PRN
  Administered 2015-04-10: 5 mg via ORAL
  Administered 2015-04-10 – 2015-04-11 (×5): 10 mg via ORAL
  Filled 2015-04-10 (×6): qty 2

## 2015-04-10 NOTE — Progress Notes (Signed)
[facility]                                                                              Patient Demographics  Christina [name], is a 54 y.o. female, DOB - 04/19/1960, OEU:[id number]  Admit date - 04/06/2015   Admitting Physician Ripudeep Krystal Eaton, MD  Outpatient Primary MD for the patient is [name],[name], MD  LOS - 4   Chief Complaint  Patient presents with  . Emesis       Brief HPI    Patient is a 54 year old female with hypertension, endometriosis, chronic anemia presented to ED with complaints of intractable nausea and vomiting that started last night. History was obtained from the patient and her husband in the room. Patient reported that she has been having rectal bleeding off and on for last 3 months. Patient was seen by gastroenterology, Dr. Collene Mares, underwent colonoscopy on 8/26 and Dr. Collene Mares was unable to pass the scope through the large nearly obstructing mass. Dr. Collene Mares had contacted surgery, biopsies were obtained of the mass, patient was scheduled to have CT scan of the abdomen on 8/29. However overnight patient started having several episodes of nausea and vomiting and presented to [facility] after the recommendations from Dr. Carlean Purl for further workup. At the time of my examination, patient complains of left lower quadrant abdominal pain, 6/10, not radiating, associated with nausea. Currently no vomiting, fevers or chills. Patient reports that she had loss of appetite in the last 1 week and may have lost about 5 pounds. She has a history of colon cancer in one of her cousins, otherwise not in her immediate family and siblings. ED workup showed sodium of 139, potassium 3.2, creatinine 0.9 every BC 7.7 hemoglobin 9.6, platelets 735. Per Dr. Collene Mares, patient had received 2 units of packed RBC transfusion last week in [place] for the hemoglobin of 6.2, confirmed from the patient. CT of the abdomen/pelvis showed several enhancing masses in the liver consistent  with liver metastases, masses in the pelvis likely of uterine origin, 9 cm sigmoid colon mass.  Oncology, general surgery was consulted. Patient underwent  upper colectomy, intraoperative liver biopsy and Port-A-Cath placement on 8/30.  Assessment & Plan   Principal Problem:  Nausea & vomiting with colon mass newly diagnosed with liver metastasis: Postop day #2 - patient had the flexible sigmoidoscopy on 8/26 and had large nearly obstructing mass ~10cm in the rectosigmoid colon. Colon biopsies are still pending - CEA elevated at 467,CA-19-9 also elevated at 1605, CA-125 normal - Underwent lap colectomy, intraoperative liver biopsy and port placement (per Dr. Johney Maine, frozen liver path with adenocarcinoma colon, but I cannot locate this in EPIC). - Postop management by surgery -Has been seen by oncology and will need to follow up with them in OP setting.  Active Problems:  Pelvic mass likely due to uterine fibroid  - Pelvic ultrasound showed mildly enlarged uterus with at least one fibroid and large 7 cm mass seen on CT is not well demonstrated sonographically    Iron deficiency anemia - Ferritin 8, s/p 1 dose of IV Feraheme 8/29  essential hypertension Continue nifedipine  Code Status:  Full code  Family Communication: Patient only  Disposition Plan: Not medically ready  time Spent in minutes 25 minutes  Procedures  CT abdomen and pelvis Pelvic ultrasound and transvaginal ultrasound  Consults  Interventional radiology   Gen. Surgery Oncology GI  DVT Prophylaxis  Lovenox  Medications  Scheduled Meds: . acetaminophen  1,000 mg Oral TID  . alvimopan  12 mg Oral BID  . enoxaparin (LOVENOX) injection  40 mg Subcutaneous Q24H  . lip balm  1 application Topical BID  . NIFEdipine  30 mg Oral Daily  . saccharomyces boulardii  250 mg Oral BID  . sodium chloride  3 mL Intravenous Q12H  . sodium chloride  3 mL Intravenous Q12H   Continuous Infusions:   PRN Meds:.sodium  chloride, alum & mag hydroxide-simeth, diphenhydrAMINE **OR** diphenhydrAMINE, HYDROmorphone (DILAUDID) injection, lactated ringers, metoprolol, metoprolol tartrate, ondansetron **OR** ondansetron (ZOFRAN) IV, oxyCODONE, promethazine, sodium chloride   Antibiotics   Anti-infectives    Start     Dose/Rate Route Frequency Ordered Stop   04/09/15 0130  cefoTEtan (CEFOTAN) 2 g in dextrose 5 % 50 mL IVPB     2 g 100 mL/hr over 30 Minutes Intravenous Every 12 hours 04/08/15 1859 04/09/15 0142   04/08/15 1620  clindamycin (CLEOCIN) 900 mg, gentamicin (GARAMYCIN) 240 mg in sodium chloride 0.9 % 1,000 mL for intraperitoneal lavage  Status:  Discontinued       As needed 04/08/15 1621 04/08/15 1736   04/08/15 0800  clindamycin (CLEOCIN) 900 mg, gentamicin (GARAMYCIN) 240 mg in sodium chloride 0.9 % 1,000 mL for intraperitoneal lavage  Status:  Discontinued    Comments:  Pharmacy may adjust dosing strength, schedule, rate of infusion, etc as needed to optimize therapy    Intraperitoneal To Surgery 04/07/15 1128 04/09/15 0944   04/08/15 0600  cefoTEtan (CEFOTAN) 2 g in dextrose 5 % 50 mL IVPB     2 g 100 mL/hr over 30 Minutes Intravenous On call to O.R. 04/07/15 1128 04/08/15 1330   04/07/15 1300  neomycin (MYCIFRADIN) tablet 1,000 mg     1,000 mg Oral 3 times per day on Mon 04/07/15 1128 04/07/15 2208   04/07/15 1300  metroNIDAZOLE (FLAGYL) tablet 1,000 mg    Comments:  Take 2 pills (=1000mg ) by mouth at 1pm, 3pm, and 10pm the day before your colorectal operation   1,000 mg Oral 3 times per day on Mon 04/07/15 1128 04/07/15 2208        Subjective:   Celisse Ciulla was seen and examined today. Doing well, ambulating in the hallways, no nausea or vomiting, no BM yet. Patient denies dizziness, chest pain, shortness of breath,  new weakness, numbess, tingling. No acute events overnight.    Objective:   Blood pressure 121/70, pulse 92, temperature 98.9 F (37.2 C), temperature source Oral, resp.  rate 18, height 5\' 6"  (1.676 m), weight 72.485 kg (159 lb 12.8 oz), SpO2 95 %.  Wt Readings from Last 3 Encounters:  04/10/15 72.485 kg (159 lb 12.8 oz)     Intake/Output Summary (Last 24 hours) at 04/10/15 0859 Last data filed at 04/10/15 0500  Gross per 24 hour  Intake      0 ml  Output   2700 ml  Net  -2700 ml    Exam  General: Alert and oriented x 3, NAD  HEENT:  PERRLA, EOMI,   Neck: Supple, no JVD, no masses  CVS: S1 S2 clear, RRR  Respiratory: CTAB  Abdomen: Soft, hypoactive bowel sounds  Ext: no cyanosis clubbing or edema  Neuro: no new deficits  Skin: No rashes  Psych: Normal affect and demeanor, alert and oriented x3    Data Review   Micro Results Recent Results (from the past 240 hour(s))  Surgical pcr screen     Status: None   Collection Time: 04/08/15 12:57 AM  Result Value Ref Range Status   MRSA, PCR NEGATIVE NEGATIVE Final   Staphylococcus aureus NEGATIVE NEGATIVE Final    Comment:        The Xpert SA Assay (FDA approved for NASAL specimens in patients over 23 years of age), is one component of a comprehensive surveillance program.  Test performance has been validated by [facility] for patients greater than or equal to 24 year old. It is not intended to diagnose infection nor to guide or monitor treatment.     Radiology Reports Ct Chest W Contrast  04/06/2015   ADDENDUM REPORT: 04/06/2015 13:18  ADDENDUM: Upon further evaluation, The masslike lesion in the pelvis previously question matted small bowel loops represent 9 cm sigmoid colon cancer.   Electronically Signed   By: Abelardo Diesel M.D.   On: 04/06/2015 13:18   04/06/2015   CLINICAL DATA:  Vomiting.  EXAM: CT CHEST, ABDOMEN, AND PELVIS WITH CONTRAST  TECHNIQUE: Multidetector CT imaging of the chest, abdomen and pelvis was performed following the standard protocol during bolus administration of intravenous contrast.  CONTRAST:  80mL OMNIPAQUE IOHEXOL 300 MG/ML SOLN, 139mL OMNIPAQUE  IOHEXOL 300 MG/ML SOLN  COMPARISON:  None.  FINDINGS: CT CHEST FINDINGS  Mediastinum/Nodes: There is no mediastinal or hilar lymphadenopathy. There is no mediastinal hematoma. The heart size is normal. There is no pericardial effusion.  Lungs/Pleura: There is no pulmonary mass or nodule. No focal pneumonia or pleural effusion is identified.  Musculoskeletal: No acute abnormality is identified within the visualized bones.  CT ABDOMEN PELVIS FINDINGS  Hepatobiliary: There is a 9.1 x 6.1 cm heterogeneously enhancing mass in the left lobe liver. There is a 0.9 x 1.6 cm mild enhancing mass in the dome of the liver. There is a 1.9 x 1.6 cm enhancing mass at the junction of caudate and right lobe liver. A 3 mm low density lesion is identified in the inferior left lobe liver. The gallbladder is normal.  Pancreas: Normal.  Spleen: Normal.  Adrenals/Urinary Tract: The adrenal glands are normal. They kidneys are normal. There is no nephrolithiasis or hydroureteronephrosis bilaterally.  Stomach/Bowel: There is a question 5.8 cm conglomerate of matted bowel loops in the mid lower abdomen. There is no small bowel obstruction. The colon is normal.  Vascular/Lymphatic: The aorta is normal. There is no aneurysmal dilatation of the aorta. There is no abdominal lymphadenopathy.  Reproductive: There is a 6 x 6.9 cm heterogeneously enhancing mass question arising from the uterus. A second mass, inseparable from the uterus that is lower density than adjacent uterine myometrium measures 5.7 x 7.8 cm.  Other: None.  Musculoskeletal: No focal discrete lytic or blastic lesion is identified within the visualized bones.  IMPRESSION: No acute abnormality identified within the chest. No evidence of metastatic disease.  Several enhancing masses in the liver consistent with liver metastasis.  Masses within the pelvis likely of the Uterine origin. Given the findings within the liver, malignant neoplasm is not excluded.  A conglomerate of matted  bowel loops in the mid lower abdomen. There is no evidence of small bowel obstruction.  Electronically Signed: By: Abelardo Diesel M.D. On: 04/06/2015 11:49  US Transvaginal Non-ob  04/06/2015   CLINICAL DATA:  Followup CT to evaluate possible uterine masses. History of uterine fibroids, endometriosis.  EXAM: TRANSABDOMINAL AND TRANSVAGINAL ULTRASOUND OF PELVIS  TECHNIQUE: Both transabdominal and transvaginal ultrasound examinations of the pelvis were performed. Transabdominal technique was performed for global imaging of the pelvis including uterus, ovaries, adnexal regions, and pelvic cul-de-sac. It was necessary to proceed with endovaginal exam following the transabdominal exam to visualize the endometrium and ovaries.  COMPARISON:  CT today.  FINDINGS: Uterus  Measurements: 6.3 x 6.3 x 11.2 cm. There is at least 1 fibroid along the right side of the uterine body measuring 2.2 x 2.4 x 2.8 cm. Large mass seen on CT abutting the right-sided the lower uterus is not well demonstrated by ultrasound transabdominally nor endovaginally due to moderate adnexal bowel gas.  Endometrium  Thickness: 4 mm.  No focal abnormality visualized.  Right ovary  Measurements: Not visualized.  Left ovary  Measurements: Not visualized.  Other findings  No free fluid.  IMPRESSION: Mildly enlarged uterus with at least 1 fibroid over the right side of the uterine body measuring 2.8 cm. The large 7 cm mass seen on CT which appears to be arising from the right side of the lower uterine segment is not well demonstrated sonographically.  Nonvisualization of the ovaries.  No free fluid.   Electronically Signed   By: Marin Olp M.D.   On: 04/06/2015 21:05   US Pelvis Complete  04/06/2015   CLINICAL DATA:  Followup CT to evaluate possible uterine masses. History of uterine fibroids, endometriosis.  EXAM: TRANSABDOMINAL AND TRANSVAGINAL ULTRASOUND OF PELVIS  TECHNIQUE: Both transabdominal and transvaginal ultrasound examinations of the  pelvis were performed. Transabdominal technique was performed for global imaging of the pelvis including uterus, ovaries, adnexal regions, and pelvic cul-de-sac. It was necessary to proceed with endovaginal exam following the transabdominal exam to visualize the endometrium and ovaries.  COMPARISON:  CT today.  FINDINGS: Uterus  Measurements: 6.3 x 6.3 x 11.2 cm. There is at least 1 fibroid along the right side of the uterine body measuring 2.2 x 2.4 x 2.8 cm. Large mass seen on CT abutting the right-sided the lower uterus is not well demonstrated by ultrasound transabdominally nor endovaginally due to moderate adnexal bowel gas.  Endometrium  Thickness: 4 mm.  No focal abnormality visualized.  Right ovary  Measurements: Not visualized.  Left ovary  Measurements: Not visualized.  Other findings  No free fluid.  IMPRESSION: Mildly enlarged uterus with at least 1 fibroid over the right side of the uterine body measuring 2.8 cm. The large 7 cm mass seen on CT which appears to be arising from the right side of the lower uterine segment is not well demonstrated sonographically.  Nonvisualization of the ovaries.  No free fluid.   Electronically Signed   By: Marin Olp M.D.   On: 04/06/2015 21:05   Ct Abdomen Pelvis W Contrast  04/06/2015   ADDENDUM REPORT: 04/06/2015 13:18  ADDENDUM: Upon further evaluation, The masslike lesion in the pelvis previously question matted small bowel loops represent 9 cm sigmoid colon cancer.   Electronically Signed   By: Abelardo Diesel M.D.   On: 04/06/2015 13:18   04/06/2015   CLINICAL DATA:  Vomiting.  EXAM: CT CHEST, ABDOMEN, AND PELVIS WITH CONTRAST  TECHNIQUE: Multidetector CT imaging of the chest, abdomen and pelvis was performed following the standard protocol during bolus administration of intravenous contrast.  CONTRAST:  31mL OMNIPAQUE IOHEXOL 300 MG/ML  SOLN, 139mL OMNIPAQUE IOHEXOL 300 MG/ML SOLN  COMPARISON:  None.  FINDINGS: CT CHEST FINDINGS  Mediastinum/Nodes: There is  no mediastinal or hilar lymphadenopathy. There is no mediastinal hematoma. The heart size is normal. There is no pericardial effusion.  Lungs/Pleura: There is no pulmonary mass or nodule. No focal pneumonia or pleural effusion is identified.  Musculoskeletal: No acute abnormality is identified within the visualized bones.  CT ABDOMEN PELVIS FINDINGS  Hepatobiliary: There is a 9.1 x 6.1 cm heterogeneously enhancing mass in the left lobe liver. There is a 0.9 x 1.6 cm mild enhancing mass in the dome of the liver. There is a 1.9 x 1.6 cm enhancing mass at the junction of caudate and right lobe liver. A 3 mm low density lesion is identified in the inferior left lobe liver. The gallbladder is normal.  Pancreas: Normal.  Spleen: Normal.  Adrenals/Urinary Tract: The adrenal glands are normal. They kidneys are normal. There is no nephrolithiasis or hydroureteronephrosis bilaterally.  Stomach/Bowel: There is a question 5.8 cm conglomerate of matted bowel loops in the mid lower abdomen. There is no small bowel obstruction. The colon is normal.  Vascular/Lymphatic: The aorta is normal. There is no aneurysmal dilatation of the aorta. There is no abdominal lymphadenopathy.  Reproductive: There is a 6 x 6.9 cm heterogeneously enhancing mass question arising from the uterus. A second mass, inseparable from the uterus that is lower density than adjacent uterine myometrium measures 5.7 x 7.8 cm.  Other: None.  Musculoskeletal: No focal discrete lytic or blastic lesion is identified within the visualized bones.  IMPRESSION: No acute abnormality identified within the chest. No evidence of metastatic disease.  Several enhancing masses in the liver consistent with liver metastasis.  Masses within the pelvis likely of the Uterine origin. Given the findings within the liver, malignant neoplasm is not excluded.  A conglomerate of matted bowel loops in the mid lower abdomen. There is no evidence of small bowel obstruction.  Electronically  Signed: By: Abelardo Diesel M.D. On: 04/06/2015 11:49   Dg Chest Port 1 View  04/08/2015   CLINICAL DATA:  New Port-A-Cath insertion.  EXAM: DG C-ARM 1-60 MIN-NO REPORT; PORTABLE CHEST - 1 VIEW  COMPARISON:  CT 04/06/2015  FINDINGS: Right anterior chest wall Port-A-Cath is present with tip projecting over the superior cavoatrial junction. Enlarged cardiac and mediastinal contours. No consolidative pulmonary opacities. Nipple shadow projects over the left lung base. Gas within the soft tissues overlying right chest wall. No large pneumothorax.  IMPRESSION: Right anterior chest wall Port-A-Cath tip projects over the superior cavoatrial junction.  No large pneumothorax.   Electronically Signed   By: Lovey Newcomer M.D.   On: 04/08/2015 18:20   Dg C-arm 1-60 Min-no Report  04/08/2015   CLINICAL DATA: port placement   C-ARM 1-60 MINUTES  Fluoroscopy was utilized by the requesting physician.  No radiographic  interpretation.     CBC  Recent Labs Lab 04/06/15 0945 04/07/15 0537 04/08/15 0553 04/09/15 0600 04/10/15 0530  WBC 7.7 7.2 7.3 11.5* 11.5*  HGB 9.6* 8.9* 8.7* 8.4* 8.4*  HCT 32.6* 30.9* 30.2* 28.9* 28.8*  PLT 735* 597* 579* 575* 487*  MCV 59.5* 60.2* 60.3* 60.1* 61.0*  MCH 17.5* 17.3* 17.4* 17.5* 17.8*  MCHC 29.4* 28.8* 28.8* 29.1* 29.2*  RDW 29.4* 29.4* 29.7* 29.8* 30.5*  LYMPHSABS 0.7  --  1.5  --   --   MONOABS 0.3  --  0.7  --   --   EOSABS 0.1  --  0.4  --   --   BASOSABS 0.0  --  0.0  --   --     Chemistries   Recent Labs Lab 04/06/15 0945 04/07/15 0537 04/09/15 0600 04/10/15 0530  NA 139 143 138 138  K 3.2* 3.3* 3.6 3.5  CL 104 107 103 103  CO2 24 28 25 29   GLUCOSE 105* 91 107* 96  BUN 10 <5* <5* 8  CREATININE 0.90 0.80 0.86 0.80  CALCIUM 9.5 9.1 8.9 8.8*  MG  --   --  1.6*  --   AST 22  --   --   --   ALT 10*  --   --   --   ALKPHOS 63  --   --   --   BILITOT 0.8  --   --   --     ------------------------------------------------------------------------------------------------------------------ estimated creatinine clearance is 81 mL/min (by C-G formula based on Cr of 0.8). ------------------------------------------------------------------------------------------------------------------  Recent Labs  04/08/15 0553  HGBA1C 5.7*   ------------------------------------------------------------------------------------------------------------------ No results for input(s): CHOL, HDL, LDLCALC, TRIG, CHOLHDL, LDLDIRECT in the last 72 hours. ------------------------------------------------------------------------------------------------------------------ No results for input(s): TSH, T4TOTAL, T3FREE, THYROIDAB in the last 72 hours.  Invalid input(s): FREET3 ------------------------------------------------------------------------------------------------------------------ No results for input(s): VITAMINB12, FOLATE, FERRITIN, TIBC, IRON, RETICCTPCT in the last 72 hours.  Coagulation profile  Recent Labs Lab 04/07/15 1100 04/08/15 0553  INR 1.17 1.26    No results for input(s): DDIMER in the last 72 hours.  Cardiac Enzymes No results for input(s): CKMB, TROPONINI, MYOGLOBIN in the last 168 hours.  Invalid input(s): CK ------------------------------------------------------------------------------------------------------------------ Invalid input(s): POCBNP  No results for input(s): GLUCAP in the last 72 hours.   Lelon Frohlich M.D. [facility] 04/10/2015, 8:59 AM  Pager: [phone] Between 7am to 7pm - call Pager - [phone]  After 7pm go to [email or web address] - password [email or web address]  Call night coverage person covering after 7pm

## 2015-04-10 NOTE — Progress Notes (Signed)
Pharmacy Brief Note - Alvimopan (Entereg)  The standing order set for alvimopan (Entereg) now includes an automatic order to discontinue the drug after the patient has had a bowel movement. The change was approved by the [place] and the Medical Executive Committee.   This patient has had bowel movements documented by nursing. Therefore, alvimopan has been discontinued. If there are questions, please contact the pharmacy at [phone].   Dia Sitter, PharmD, BCPS 04/10/2015 10:14 AM

## 2015-04-10 NOTE — Progress Notes (Signed)
2 Days Post-Op  Subjective: She is doing well.  Walking she is on a Lactose free diet.  She has had a BM.  She is currently using tylenol and Dilaudid for pain control.  Her incisions look fine.  I changed the lower dressing but did not take out the wicks yet.  Objective: Vital signs in last 24 hours: Temp:  [98 F (36.7 C)-99.1 F (37.3 C)] 98.9 F (37.2 C) (09/01 0500) Pulse Rate:  [80-102] 92 (09/01 0500) Resp:  [18-20] 18 (09/01 0500) BP: (110-158)/(59-74) 121/70 mmHg (09/01 0500) SpO2:  [95 %-100 %] 95 % (09/01 0500) Weight:  [72.485 kg (159 lb 12.8 oz)] 72.485 kg (159 lb 12.8 oz) (09/01 0553) Last BM Date: 04/09/15 Lactose free 2150 urine TM 99.3  Intake/Output from previous day: 08/31 0701 - 09/01 0700 In: -  Out: 2700 [Urine:2700] Intake/Output this shift:    General appearance: alert, cooperative and no distress Resp: clear to auscultation bilaterally GI: soft, sore, port sites look fine.  I took the dressing down and cleaned around the wicks placing a new dressing there.  Lab Results:   Recent Labs  04/09/15 0600 04/10/15 0530  WBC 11.5* 11.5*  HGB 8.4* 8.4*  HCT 28.9* 28.8*  PLT 575* 487*    BMET  Recent Labs  04/09/15 0600 04/10/15 0530  NA 138 138  K 3.6 3.5  CL 103 103  CO2 25 29  GLUCOSE 107* 96  BUN <5* 8  CREATININE 0.86 0.80  CALCIUM 8.9 8.8*   PT/INR  Recent Labs  04/07/15 1100 04/08/15 0553  LABPROT 15.1 15.9*  INR 1.17 1.26     Recent Labs Lab 04/06/15 0945  AST 22  ALT 10*  ALKPHOS 63  BILITOT 0.8  PROT 7.7  ALBUMIN 3.9     Lipase     Component Value Date/Time   LIPASE 15* 04/06/2015 0945     Studies/Results: Dg Chest Port 1 View  04/08/2015   CLINICAL DATA:  New Port-A-Cath insertion.  EXAM: DG C-ARM 1-60 MIN-NO REPORT; PORTABLE CHEST - 1 VIEW  COMPARISON:  CT 04/06/2015  FINDINGS: Right anterior chest wall Port-A-Cath is present with tip projecting over the superior cavoatrial junction. Enlarged cardiac  and mediastinal contours. No consolidative pulmonary opacities. Nipple shadow projects over the left lung base. Gas within the soft tissues overlying right chest wall. No large pneumothorax.  IMPRESSION: Right anterior chest wall Port-A-Cath tip projects over the superior cavoatrial junction.  No large pneumothorax.   Electronically Signed   By: Lovey Newcomer M.D.   On: 04/08/2015 18:20   Dg C-arm 1-60 Min-no Report  04/08/2015   CLINICAL DATA: port placement   C-ARM 1-60 MINUTES  Fluoroscopy was utilized by the requesting physician.  No radiographic  interpretation.     Medications: . acetaminophen  1,000 mg Oral TID  . alvimopan  12 mg Oral BID  . enoxaparin (LOVENOX) injection  40 mg Subcutaneous Q24H  . lip balm  1 application Topical BID  . NIFEdipine  30 mg Oral Daily  . saccharomyces boulardii  250 mg Oral BID  . sodium chloride  3 mL Intravenous Q12H  . sodium chloride  3 mL Intravenous Q12H     Prior to Admission medications   Medication Sig Start Date End Date Taking? Authorizing Provider  NIFEdipine (PROCARDIA-XL/ADALAT CC) 30 MG 24 hr tablet Take 30 mg by mouth daily.   Yes Historical Provider, MD  oxyCODONE (OXY IR/ROXICODONE) 5 MG immediate release tablet Take 1-2  tablets (5-10 mg total) by mouth every 4 (four) hours as needed for moderate pain, severe pain or breakthrough pain. 04/08/15   Michael Boston, MD     Assessment/Plan Obstructing sigmoid colon cancer, liver mass S/pLAPAROSCOPIC SIGMOID COLECTOMY, Laparoscopic mobilization of the splenic flexure of the colon CORE NEEDLE LIVER BIOPSY, RIGID PROCTOSCOPY, INSERTION PORT-A-CATH, 04/08/15, DR. Gross POD 2 Hz of uterine fibroids Hypertension Endometriosis Hx of Thalassemia Antibiotics:  Completed with preop dose DVT: Lovenox/SCD  Plan:  Add oxycodone and continue to mobilize.  See how she does with PO pain medicines.  She should be able to go home soon.  Path is still not ready.  She currently has no need for pre op BP  meds.    LOS: 4 days    Lon Klippel 04/10/2015

## 2015-04-11 LAB — BASIC METABOLIC PANEL
ANION GAP: 6 (ref 5–15)
BUN: 11 mg/dL (ref 6–20)
CALCIUM: 9 mg/dL (ref 8.9–10.3)
CO2: 30 mmol/L (ref 22–32)
Chloride: 103 mmol/L (ref 101–111)
Creatinine, Ser: 0.86 mg/dL (ref 0.44–1.00)
GFR calc Af Amer: >60 mL/min (ref >60)
GLUCOSE: 94 mg/dL (ref 65–99)
Potassium: 3.5 mmol/L (ref 3.5–5.1)
Sodium: 139 mmol/L (ref 135–145)

## 2015-04-11 LAB — CBC
HCT: 27.3 % — ABNORMAL LOW (ref 36.0–46.0)
Hemoglobin: 7.8 g/dL — ABNORMAL LOW (ref 12.0–15.0)
MCH: 17.7 pg — ABNORMAL LOW (ref 26.0–34.0)
MCHC: 28.6 g/dL — ABNORMAL LOW (ref 30.0–36.0)
MCV: 61.9 fL — AB (ref 78.0–100.0)
PLATELETS: 461 10*3/uL — AB (ref 150–400)
RBC: 4.41 MIL/uL (ref 3.87–5.11)
RDW: 31.4 % — AB (ref 11.5–15.5)
WBC: 9.8 10*3/uL (ref 4.0–10.5)

## 2015-04-11 MED ORDER — OXYCODONE HCL 5 MG PO TABS: 5.0000 mg | ORAL_TABLET | ORAL | Status: DC | PRN

## 2015-04-11 MED ORDER — ONDANSETRON HCL 4 MG PO TABS: 4.0000 mg | ORAL_TABLET | Freq: Four times a day (QID) | ORAL | Status: DC | PRN

## 2015-04-11 NOTE — Discharge Summary (Signed)
Physician Discharge Summary  Christina [name] QVZ:[id number] DOB: 04/19/1960 DOA: 04/06/2015  PCP: Benito Mccreedy, MD  Admit date: 04/06/2015 Discharge date: 04/11/2015  Time spent: 45 minutes  Recommendations for Outpatient Follow-up:  -Will be discharged home today. -Will follow up with Dr. Johney Maine in 2 weeks for continued post-op care. -Follow up with Dr. Burr Medico in 3 weeks for initiation of chemotherapy.   Discharge Diagnoses:  Principal Problem:   Cancer of sigmoid colon - partially obstructing - s/p lap colectomy 04/08/2015 Active Problems:   Nausea & vomiting    Pelvic mass - 9cm - ?fibroid vs drop metastasis   Liver metastasis from colorectal cancer   Hypokalemia   Anemia   Hypertension   Thalassanemia   Metastatic colon cancer to liver   Discharge Condition: Stable and improved  Filed Weights   04/08/15 2204 04/10/15 0553 04/11/15 0557  Weight: 73.573 kg (162 lb 3.2 oz) 72.485 kg (159 lb 12.8 oz) 73.12 kg (161 lb 3.2 oz)    History of present illness:  As per Dr. Tana Coast 8/28: Patient is a 54 year old female with hypertension, endometriosis, chronic anemia presented to ED with complaints of intractable nausea and vomiting that started last night. History was obtained from the patient and her husband in the room. Patient reported that she has been having rectal bleeding off and on for last 3 months. Patient was seen by gastroenterology, Dr. Collene Mares, underwent colonoscopy on 8/26 and Dr. Collene Mares was unable to pass the scope through the large nearly obstructing mass. Dr. Collene Mares had contacted surgery, biopsies were obtained of the mass, patient was scheduled to have CT scan of the abdomen on 8/29. However overnight patient started having several episodes of nausea and vomiting and presented to [facility] after the recommendations from Dr. Carlean Purl for further workup. At the time of my examination, patient complains of left lower quadrant abdominal pain, 6/10, not radiating, associated  with nausea. Currently no vomiting, fevers or chills. Patient reports that she had loss of appetite in the last 1 week and may have lost about 5 pounds. She has a history of colon cancer in one of her cousins, otherwise not in her immediate family and siblings. ED workup showed sodium of 139, potassium 3.2, creatinine 0.9 every BC 7.7 hemoglobin 9.6, platelets 735. Per Dr. Collene Mares, patient had received 2 units of packed RBC transfusion last week in [place] for the hemoglobin of 6.2, confirmed from the patient. CT of the abdomen/pelvis showed several enhancing masses in the liver consistent with liver metastases, masses in the pelvis likely of uterine origin, 9 cm sigmoid colon mass.   Hospital Course:   Nausea & vomiting with colon mass newly diagnosed with liver metastasis - patient had the flexible sigmoidoscopy on 8/26 and had large nearly obstructing mass ~10cm in the rectosigmoid colon. Colon biopsies are still pending - CEA elevated at 467,CA-19-9 also elevated at 1605, CA-125 normal - Underwent lap colectomy, intraoperative liver biopsy and port placement (per Dr. Johney Maine, frozen liver path with adenocarcinoma colon, but I cannot locate this in EPIC). - Postop management by surgery -Has been seen by oncology and will need to follow up with them in OP setting.  Active Problems:  Pelvic mass likely due to uterine fibroid  - Pelvic ultrasound showed mildly enlarged uterus with at least one fibroid and large 7 cm mass seen on CT is not well demonstrated sonographically   Iron deficiency anemia - Ferritin 8, s/p 1 dose of IV Feraheme 8/29  essential hypertension  Continue nifedipine  Procedures:  As above   Consultations:  Surgery  Oncology  Discharge Instructions  Discharge Instructions    Call MD for:  extreme fatigue    Complete by:  As directed      Call MD for:  hives    Complete by:  As directed      Call MD for:  persistant nausea and vomiting    Complete by:  As  directed      Call MD for:  redness, tenderness, or signs of infection (pain, swelling, redness, odor or green/yellow discharge around incision site)    Complete by:  As directed      Call MD for:  severe uncontrolled pain    Complete by:  As directed      Call MD for:    Complete by:  As directed   Temperature > 101.77F     Diet - low sodium heart healthy    Complete by:  As directed      Diet - low sodium heart healthy    Complete by:  As directed      Discharge instructions    Complete by:  As directed   Please see discharge instruction sheets.  Also refer to handout given an office.  Please call our office if you have any questions or concerns [phone]     Discharge wound care:    Complete by:  As directed   If you have closed incisions, shower and bathe over these incisions with soap and water every day.  Remove all surgical dressings on postoperative day #3.  You do not need to replace dressings over the closed incisions unless you feel more comfortable with a Band-Aid covering it.   If you have an open wound that requires packing, please see wound care instructions.  In general, remove all dressings, wash wound with soap and water and then replace with saline moistened gauze.  Do the dressing change at least every day.  Please call our office [phone] if you have further questions.     Driving Restrictions    Complete by:  As directed   No driving until off narcotics and can safely swerve away without pain during an emergency     Increase activity slowly    Complete by:  As directed   Walk an hour a day.  Use 20-30 minute walks.  When you can walk 30 minutes without difficulty, it is fine to restart low impact/moderate activities such as biking, jogging, swimming, sexual activity, etc.  Eventually you can increase to unrestricted activity when not feeling pain.  If you feel pain: STOP!Marland Kitchen   Let pain protect you from overdoing it.  Use ice/heat & over-the-counter pain  medications to help minimize soreness.  If that is not enough, then use your narcotic pain prescription as needed to remain active.  It is better to take extra pain medications and be more active than to stay bedridden to avoid all pain medications.     Increase activity slowly    Complete by:  As directed      Lifting restrictions    Complete by:  As directed   Avoid heavy lifting initially.  Do not push through pain.  You have no specific weight limit - if it hurts to do, DON'T DO IT.   If you feel no pain, you are not injuring anything.  Pain will protect you from injury.  Coughing and sneezing are far more stressful to your  incision than any lifting.  Avoid resuming heavy lifting / intense activity until off all narcotic pain medications.  When ready to exercise more, give yourself 2 weeks to gradually get back to full intense exercise/activity.     May shower / Bathe    Complete by:  As directed      May walk up steps    Complete by:  As directed      Sexual Activity Restrictions    Complete by:  As directed   Sexual activity as tolerated.  Do not push through pain.  Pain will protect you from injury.     Walk with assistance    Complete by:  As directed   Walk over an hour a day.  May use a walker/cane/companion to help with balance and stamina.            Medication List    TAKE these medications        NIFEdipine 30 MG 24 hr tablet  Commonly known as:  PROCARDIA-XL/ADALAT CC  Take 30 mg by mouth daily.     ondansetron 4 MG tablet  Commonly known as:  ZOFRAN  Take 1 tablet (4 mg total) by mouth every 6 (six) hours as needed for nausea.     oxyCODONE 5 MG immediate release tablet  Commonly known as:  Oxy IR/ROXICODONE  Take 1-2 tablets (5-10 mg total) by mouth every 4 (four) hours as needed for moderate pain, severe pain or breakthrough pain.     oxyCODONE 5 MG immediate release tablet  Commonly known as:  Oxy IR/ROXICODONE  Take 1-2 tablets (5-10 mg total) by mouth every  4 (four) hours as needed for moderate pain.       Allergies  Allergen Reactions  . Milk-Related Compounds Diarrhea  . Nsaids Other (See Comments)    Heavy menstrual bleeding        Follow-up Information    Follow up with GROSS,STEVEN C., MD. Schedule an appointment as soon as possible for a visit in 2 weeks.   Specialty:  General Surgery   Why:  To follow up after your operation, To follow up after your hospital stay   Contact information:   [place] [address] [phone]       Follow up with Truitt Merle, MD. Schedule an appointment as soon as possible for a visit in 2 weeks.   Specialties:  Hematology, Oncology   Contact information:   [place] [address] [phone]        The results of significant diagnostics from this hospitalization (including imaging, microbiology, ancillary and laboratory) are listed below for reference.    Significant Diagnostic Studies: Ct Chest W Contrast  04/06/2015   ADDENDUM REPORT: 04/06/2015 13:18  ADDENDUM: Upon further evaluation, The masslike lesion in the pelvis previously question matted small bowel loops represent 9 cm sigmoid colon cancer.   Electronically Signed   By: Abelardo Diesel M.D.   On: 04/06/2015 13:18   04/06/2015   CLINICAL DATA:  Vomiting.  EXAM: CT CHEST, ABDOMEN, AND PELVIS WITH CONTRAST  TECHNIQUE: Multidetector CT imaging of the chest, abdomen and pelvis was performed following the standard protocol during bolus administration of intravenous contrast.  CONTRAST:  54mL OMNIPAQUE IOHEXOL 300 MG/ML SOLN, 126mL OMNIPAQUE IOHEXOL 300 MG/ML SOLN  COMPARISON:  None.  FINDINGS: CT CHEST FINDINGS  Mediastinum/Nodes: There is no mediastinal or hilar lymphadenopathy. There is no mediastinal hematoma. The heart size is normal.  There is no pericardial effusion.  Lungs/Pleura: There is no pulmonary mass or nodule. No focal pneumonia or pleural effusion is identified.  Musculoskeletal: No acute  abnormality is identified within the visualized bones.  CT ABDOMEN PELVIS FINDINGS  Hepatobiliary: There is a 9.1 x 6.1 cm heterogeneously enhancing mass in the left lobe liver. There is a 0.9 x 1.6 cm mild enhancing mass in the dome of the liver. There is a 1.9 x 1.6 cm enhancing mass at the junction of caudate and right lobe liver. A 3 mm low density lesion is identified in the inferior left lobe liver. The gallbladder is normal.  Pancreas: Normal.  Spleen: Normal.  Adrenals/Urinary Tract: The adrenal glands are normal. They kidneys are normal. There is no nephrolithiasis or hydroureteronephrosis bilaterally.  Stomach/Bowel: There is a question 5.8 cm conglomerate of matted bowel loops in the mid lower abdomen. There is no small bowel obstruction. The colon is normal.  Vascular/Lymphatic: The aorta is normal. There is no aneurysmal dilatation of the aorta. There is no abdominal lymphadenopathy.  Reproductive: There is a 6 x 6.9 cm heterogeneously enhancing mass question arising from the uterus. A second mass, inseparable from the uterus that is lower density than adjacent uterine myometrium measures 5.7 x 7.8 cm.  Other: None.  Musculoskeletal: No focal discrete lytic or blastic lesion is identified within the visualized bones.  IMPRESSION: No acute abnormality identified within the chest. No evidence of metastatic disease.  Several enhancing masses in the liver consistent with liver metastasis.  Masses within the pelvis likely of the Uterine origin. Given the findings within the liver, malignant neoplasm is not excluded.  A conglomerate of matted bowel loops in the mid lower abdomen. There is no evidence of small bowel obstruction.  Electronically Signed: By: Abelardo Diesel M.D. On: 04/06/2015 11:49   US Transvaginal Non-ob  04/06/2015   CLINICAL DATA:  Followup CT to evaluate possible uterine masses. History of uterine fibroids, endometriosis.  EXAM: TRANSABDOMINAL AND TRANSVAGINAL ULTRASOUND OF PELVIS   TECHNIQUE: Both transabdominal and transvaginal ultrasound examinations of the pelvis were performed. Transabdominal technique was performed for global imaging of the pelvis including uterus, ovaries, adnexal regions, and pelvic cul-de-sac. It was necessary to proceed with endovaginal exam following the transabdominal exam to visualize the endometrium and ovaries.  COMPARISON:  CT today.  FINDINGS: Uterus  Measurements: 6.3 x 6.3 x 11.2 cm. There is at least 1 fibroid along the right side of the uterine body measuring 2.2 x 2.4 x 2.8 cm. Large mass seen on CT abutting the right-sided the lower uterus is not well demonstrated by ultrasound transabdominally nor endovaginally due to moderate adnexal bowel gas.  Endometrium  Thickness: 4 mm.  No focal abnormality visualized.  Right ovary  Measurements: Not visualized.  Left ovary  Measurements: Not visualized.  Other findings  No free fluid.  IMPRESSION: Mildly enlarged uterus with at least 1 fibroid over the right side of the uterine body measuring 2.8 cm. The large 7 cm mass seen on CT which appears to be arising from the right side of the lower uterine segment is not well demonstrated sonographically.  Nonvisualization of the ovaries.  No free fluid.   Electronically Signed   By: Marin Olp M.D.   On: 04/06/2015 21:05   US Pelvis Complete  04/06/2015   CLINICAL DATA:  Followup CT to evaluate possible uterine masses. History of uterine fibroids, endometriosis.  EXAM: TRANSABDOMINAL AND TRANSVAGINAL ULTRASOUND OF PELVIS  TECHNIQUE: Both transabdominal and transvaginal  ultrasound examinations of the pelvis were performed. Transabdominal technique was performed for global imaging of the pelvis including uterus, ovaries, adnexal regions, and pelvic cul-de-sac. It was necessary to proceed with endovaginal exam following the transabdominal exam to visualize the endometrium and ovaries.  COMPARISON:  CT today.  FINDINGS: Uterus  Measurements: 6.3 x 6.3 x 11.2 cm. There  is at least 1 fibroid along the right side of the uterine body measuring 2.2 x 2.4 x 2.8 cm. Large mass seen on CT abutting the right-sided the lower uterus is not well demonstrated by ultrasound transabdominally nor endovaginally due to moderate adnexal bowel gas.  Endometrium  Thickness: 4 mm.  No focal abnormality visualized.  Right ovary  Measurements: Not visualized.  Left ovary  Measurements: Not visualized.  Other findings  No free fluid.  IMPRESSION: Mildly enlarged uterus with at least 1 fibroid over the right side of the uterine body measuring 2.8 cm. The large 7 cm mass seen on CT which appears to be arising from the right side of the lower uterine segment is not well demonstrated sonographically.  Nonvisualization of the ovaries.  No free fluid.   Electronically Signed   By: Marin Olp M.D.   On: 04/06/2015 21:05   Ct Abdomen Pelvis W Contrast  04/06/2015   ADDENDUM REPORT: 04/06/2015 13:18  ADDENDUM: Upon further evaluation, The masslike lesion in the pelvis previously question matted small bowel loops represent 9 cm sigmoid colon cancer.   Electronically Signed   By: Abelardo Diesel M.D.   On: 04/06/2015 13:18   04/06/2015   CLINICAL DATA:  Vomiting.  EXAM: CT CHEST, ABDOMEN, AND PELVIS WITH CONTRAST  TECHNIQUE: Multidetector CT imaging of the chest, abdomen and pelvis was performed following the standard protocol during bolus administration of intravenous contrast.  CONTRAST:  45mL OMNIPAQUE IOHEXOL 300 MG/ML SOLN, 175mL OMNIPAQUE IOHEXOL 300 MG/ML SOLN  COMPARISON:  None.  FINDINGS: CT CHEST FINDINGS  Mediastinum/Nodes: There is no mediastinal or hilar lymphadenopathy. There is no mediastinal hematoma. The heart size is normal. There is no pericardial effusion.  Lungs/Pleura: There is no pulmonary mass or nodule. No focal pneumonia or pleural effusion is identified.  Musculoskeletal: No acute abnormality is identified within the visualized bones.  CT ABDOMEN PELVIS FINDINGS  Hepatobiliary: There  is a 9.1 x 6.1 cm heterogeneously enhancing mass in the left lobe liver. There is a 0.9 x 1.6 cm mild enhancing mass in the dome of the liver. There is a 1.9 x 1.6 cm enhancing mass at the junction of caudate and right lobe liver. A 3 mm low density lesion is identified in the inferior left lobe liver. The gallbladder is normal.  Pancreas: Normal.  Spleen: Normal.  Adrenals/Urinary Tract: The adrenal glands are normal. They kidneys are normal. There is no nephrolithiasis or hydroureteronephrosis bilaterally.  Stomach/Bowel: There is a question 5.8 cm conglomerate of matted bowel loops in the mid lower abdomen. There is no small bowel obstruction. The colon is normal.  Vascular/Lymphatic: The aorta is normal. There is no aneurysmal dilatation of the aorta. There is no abdominal lymphadenopathy.  Reproductive: There is a 6 x 6.9 cm heterogeneously enhancing mass question arising from the uterus. A second mass, inseparable from the uterus that is lower density than adjacent uterine myometrium measures 5.7 x 7.8 cm.  Other: None.  Musculoskeletal: No focal discrete lytic or blastic lesion is identified within the visualized bones.  IMPRESSION: No acute abnormality identified within the chest. No evidence of metastatic disease.  Several enhancing masses in the liver consistent with liver metastasis.  Masses within the pelvis likely of the Uterine origin. Given the findings within the liver, malignant neoplasm is not excluded.  A conglomerate of matted bowel loops in the mid lower abdomen. There is no evidence of small bowel obstruction.  Electronically Signed: By: Abelardo Diesel M.D. On: 04/06/2015 11:49   Dg Chest Port 1 View  04/08/2015   CLINICAL DATA:  New Port-A-Cath insertion.  EXAM: DG C-ARM 1-60 MIN-NO REPORT; PORTABLE CHEST - 1 VIEW  COMPARISON:  CT 04/06/2015  FINDINGS: Right anterior chest wall Port-A-Cath is present with tip projecting over the superior cavoatrial junction. Enlarged cardiac and mediastinal  contours. No consolidative pulmonary opacities. Nipple shadow projects over the left lung base. Gas within the soft tissues overlying right chest wall. No large pneumothorax.  IMPRESSION: Right anterior chest wall Port-A-Cath tip projects over the superior cavoatrial junction.  No large pneumothorax.   Electronically Signed   By: Lovey Newcomer M.D.   On: 04/08/2015 18:20   Dg C-arm 1-60 Min-no Report  04/08/2015   CLINICAL DATA: port placement   C-ARM 1-60 MINUTES  Fluoroscopy was utilized by the requesting physician.  No radiographic  interpretation.     Microbiology: Recent Results (from the past 240 hour(s))  Surgical pcr screen     Status: None   Collection Time: 04/08/15 12:57 AM  Result Value Ref Range Status   MRSA, PCR NEGATIVE NEGATIVE Final   Staphylococcus aureus NEGATIVE NEGATIVE Final    Comment:        The Xpert SA Assay (FDA approved for NASAL specimens in patients over 31 years of age), is one component of a comprehensive surveillance program.  Test performance has been validated by [facility] for patients greater than or equal to 24 year old. It is not intended to diagnose infection nor to guide or monitor treatment.      Labs: Basic Metabolic Panel:  Recent Labs Lab 04/06/15 0945 04/07/15 0537 04/09/15 0600 04/10/15 0530 04/11/15 0601  NA 139 143 138 138 139  K 3.2* 3.3* 3.6 3.5 3.5  CL 104 107 103 103 103  CO2 24 28 25 29 30   GLUCOSE 105* 91 107* 96 94  BUN 10 <5* <5* 8 11  CREATININE 0.90 0.80 0.86 0.80 0.86  CALCIUM 9.5 9.1 8.9 8.8* 9.0  MG  --   --  1.6*  --   --    Liver Function Tests:  Recent Labs Lab 04/06/15 0945  AST 22  ALT 10*  ALKPHOS 63  BILITOT 0.8  PROT 7.7  ALBUMIN 3.9    Recent Labs Lab 04/06/15 0945  LIPASE 15*    Recent Labs Lab 04/06/15 1432  AMMONIA 17   CBC:  Recent Labs Lab 04/06/15 0945 04/07/15 0537 04/08/15 0553 04/09/15 0600 04/10/15 0530 04/11/15 0601  WBC 7.7 7.2 7.3 11.5* 11.5* 9.8    NEUTROABS 6.6  --  4.7  --   --   --   HGB 9.6* 8.9* 8.7* 8.4* 8.4* 7.8*  HCT 32.6* 30.9* 30.2* 28.9* 28.8* 27.3*  MCV 59.5* 60.2* 60.3* 60.1* 61.0* 61.9*  PLT 735* 597* 579* 575* 487* 461*   Cardiac Enzymes: No results for input(s): CKTOTAL, CKMB, CKMBINDEX, TROPONINI in the last 168 hours. BNP: BNP (last 3 results) No results for input(s): BNP in the last 8760 hours.  ProBNP (last 3 results) No results for input(s): PROBNP in the last 8760 hours.  CBG: No results for  input(s): GLUCAP in the last 168 hours.     SignedLelon Frohlich  [facility] Pager: [phone] 04/11/2015, 2:40 PM

## 2015-04-11 NOTE — Progress Notes (Signed)
Discharge instructions discussed with patient. Voices understanding . Am assessment unchanged. Discharged via w/c with husband

## 2015-04-11 NOTE — Progress Notes (Signed)
Patient ID: Christina [name], female   DOB: 04/19/1960, 54 y.o.   MRN: [id number]     [address] [place], [place] [address]    Phone: [phone] FAX: [phone]     Subjective: No n/v, having BMs and tolerating POs.  Wants to go home.   Objective:  Vital signs:  Filed Vitals:   04/10/15 1000 04/10/15 1400 04/10/15 2157 04/11/15 0557  BP: 128/65 95/50 127/69 131/72  Pulse: 99 92 87 85  Temp: 98.7 F (37.1 C) 98.9 F (37.2 C) 98 F (36.7 C) 99 F (37.2 C)  TempSrc: Oral Oral Oral Oral  Resp:  17 16 16   Height:      Weight:    73.12 kg (161 lb 3.2 oz)  SpO2: 100% 96% 95% 100%    Last BM Date: 04/10/15  Intake/Output   Yesterday:  09/01 0701 - 09/02 0700 In: 240 [P.O.:240] Out: 1625 [Urine:1625] This shift:    I/O last 3 completed shifts: In: 240 [P.O.:240] Out: 2175 [Urine:2175]    Physical Exam: General: Pt awake/alert/oriented x4 in no acute distress Abdomen: Soft.  Nondistended.  Mildly tender at incisions only.  Incisions are c/d/i.  Lower pelvic dressing was removed.  No evidence of peritonitis.  No incarcerated hernias. Chest: right chest PAC dressing c/d/i.    Problem List:   Principal Problem:   Cancer of sigmoid colon - partially obstructing - s/p lap colectomy 04/08/2015 Active Problems:   Nausea & vomiting    Pelvic mass - 9cm - ?fibroid vs drop metastasis   Liver metastasis from colorectal cancer   Hypokalemia   Anemia   Hypertension   Thalassanemia   Metastatic colon cancer to liver    Results:   Labs: Results for orders placed or performed during the hospital encounter of 04/06/15 (from the past 48 hour(s))  CBC     Status: Abnormal   Collection Time: 04/10/15  5:30 AM  Result Value Ref Range   WBC 11.5 (H) 4.0 - 10.5 K/uL   RBC 4.72 3.87 - 5.11 MIL/uL   Hemoglobin 8.4 (L) 12.0 - 15.0 g/dL   HCT 28.8 (L) 36.0 - 46.0 %   MCV 61.0 (L) 78.0 - 100.0 fL   MCH 17.8  (L) 26.0 - 34.0 pg   MCHC 29.2 (L) 30.0 - 36.0 g/dL   RDW 30.5 (H) 11.5 - 15.5 %   Platelets 487 (H) 150 - 400 K/uL    Comment: SPECIMEN CHECKED FOR CLOTS PLATELET COUNT CONFIRMED BY SMEAR LARGE PLATELETS PRESENT   Basic metabolic panel     Status: Abnormal   Collection Time: 04/10/15  5:30 AM  Result Value Ref Range   Sodium 138 135 - 145 mmol/L   Potassium 3.5 3.5 - 5.1 mmol/L   Chloride 103 101 - 111 mmol/L   CO2 29 22 - 32 mmol/L   Glucose, Bld 96 65 - 99 mg/dL   BUN 8 6 - 20 mg/dL   Creatinine, Ser 0.80 0.44 - 1.00 mg/dL   Calcium 8.8 (L) 8.9 - 10.3 mg/dL   GFR calc non Af Amer >60 >60 mL/min   GFR calc Af Amer >60 >60 mL/min    Comment: (NOTE) The eGFR has been calculated using the CKD EPI equation. This calculation has not been validated in all clinical situations. eGFR's persistently <60 mL/min signify possible Chronic Kidney Disease.    Anion gap 6 5 - 15  CBC  Status: Abnormal   Collection Time: 04/11/15  6:01 AM  Result Value Ref Range   WBC 9.8 4.0 - 10.5 K/uL   RBC 4.41 3.87 - 5.11 MIL/uL   Hemoglobin 7.8 (L) 12.0 - 15.0 g/dL   HCT 27.3 (L) 36.0 - 46.0 %   MCV 61.9 (L) 78.0 - 100.0 fL   MCH 17.7 (L) 26.0 - 34.0 pg   MCHC 28.6 (L) 30.0 - 36.0 g/dL   RDW 31.4 (H) 11.5 - 15.5 %   Platelets 461 (H) 150 - 400 K/uL    Comment: SPECIMEN CHECKED FOR CLOTS REPEATED TO VERIFY PLATELET COUNT CONFIRMED BY SMEAR   Basic metabolic panel     Status: None   Collection Time: 04/11/15  6:01 AM  Result Value Ref Range   Sodium 139 135 - 145 mmol/L   Potassium 3.5 3.5 - 5.1 mmol/L   Chloride 103 101 - 111 mmol/L   CO2 30 22 - 32 mmol/L   Glucose, Bld 94 65 - 99 mg/dL   BUN 11 6 - 20 mg/dL   Creatinine, Ser 0.86 0.44 - 1.00 mg/dL   Calcium 9.0 8.9 - 10.3 mg/dL   GFR calc non Af Amer >60 >60 mL/min   GFR calc Af Amer >60 >60 mL/min    Comment: (NOTE) The eGFR has been calculated using the CKD EPI equation. This calculation has not been validated in all clinical  situations. eGFR's persistently <60 mL/min signify possible Chronic Kidney Disease.    Anion gap 6 5 - 15    Imaging / Studies: No results found.  Medications / Allergies:  Scheduled Meds: . acetaminophen  1,000 mg Oral TID  . enoxaparin (LOVENOX) injection  40 mg Subcutaneous Q24H  . lip balm  1 application Topical BID  . NIFEdipine  30 mg Oral Daily  . saccharomyces boulardii  250 mg Oral BID  . sodium chloride  3 mL Intravenous Q12H  . sodium chloride  3 mL Intravenous Q12H   Continuous Infusions:  PRN Meds:.sodium chloride, alum & mag hydroxide-simeth, diphenhydrAMINE **OR** diphenhydrAMINE, HYDROmorphone (DILAUDID) injection, lactated ringers, metoprolol, metoprolol tartrate, ondansetron **OR** ondansetron (ZOFRAN) IV, oxyCODONE, promethazine, sodium chloride  Antibiotics: Anti-infectives    Start     Dose/Rate Route Frequency Ordered Stop   04/09/15 0130  cefoTEtan (CEFOTAN) 2 g in dextrose 5 % 50 mL IVPB     2 g 100 mL/hr over 30 Minutes Intravenous Every 12 hours 04/08/15 1859 04/09/15 0142   04/08/15 1620  clindamycin (CLEOCIN) 900 mg, gentamicin (GARAMYCIN) 240 mg in sodium chloride 0.9 % 1,000 mL for intraperitoneal lavage  Status:  Discontinued       As needed 04/08/15 1621 04/08/15 1736   04/08/15 0800  clindamycin (CLEOCIN) 900 mg, gentamicin (GARAMYCIN) 240 mg in sodium chloride 0.9 % 1,000 mL for intraperitoneal lavage  Status:  Discontinued    Comments:  Pharmacy may adjust dosing strength, schedule, rate of infusion, etc as needed to optimize therapy    Intraperitoneal To Surgery 04/07/15 1128 04/09/15 0944   04/08/15 0600  cefoTEtan (CEFOTAN) 2 g in dextrose 5 % 50 mL IVPB     2 g 100 mL/hr over 30 Minutes Intravenous On call to O.R. 04/07/15 1128 04/08/15 1330   04/07/15 1300  neomycin (MYCIFRADIN) tablet 1,000 mg     1,000 mg Oral 3 times per day on Mon 04/07/15 1128 04/07/15 2208   04/07/15 1300  metroNIDAZOLE (FLAGYL) tablet 1,000 mg    Comments:  Take 2  pills (=105m) by mouth at 1pm, 3pm, and 10pm the day before your colorectal operation   1,000 mg Oral 3 times per day on Mon 04/07/15 1128 04/07/15 2208        Assessment/Plan Obstructing sigmoid colon cancer, liver mass POD#3 laparoscopic sigmoid colectomy, mobilization of splenic flexure, core needle liver biopsy, rigid proctoscopy, insertion of PAC 04/08/15 Dr. GJohney Maine-tolerating POs, having BMs, afebrile, ambulating and pain well controlled.  Meets criteria for discharge.  Instructions provided.    EErby Pian [facility] Pager [phone](7A-4:30P)   04/11/2015 9:21 AM

## 2015-04-12 LAB — TYPE AND SCREEN
ABO/RH(D): O NEG
ANTIBODY SCREEN: NEGATIVE
Unit division: 0
Unit division: 0

## 2015-04-16 ENCOUNTER — Encounter: Payer: Self-pay | Admitting: *Deleted

## 2015-04-16 ENCOUNTER — Telehealth: Payer: Self-pay | Admitting: Hematology

## 2015-04-16 ENCOUNTER — Other Ambulatory Visit (HOSPITAL_COMMUNITY)
Admission: RE | Admit: 2015-04-16 | Discharge: 2015-04-16 | Disposition: A | Discharge status: Home / Self Care | Payer: BLUE CROSS/BLUE SHIELD | Source: Ambulatory Visit | Attending: Hematology | Admitting: Hematology

## 2015-04-16 DIAGNOSIS — C189 Malignant neoplasm of colon, unspecified: Secondary | ICD-10-CM | POA: Diagnosis present

## 2015-04-16 DIAGNOSIS — C787 Secondary malignant neoplasm of liver and intrahepatic bile duct: Secondary | ICD-10-CM | POA: Insufficient documentation

## 2015-04-16 NOTE — Progress Notes (Signed)
Email request to [facility] Pathology for Extended KRAS testing on the following case:  Patient: Christina [name], Christina [name] Collected: 04/08/2015 Client: [facility] Accession: [id number] Received: 04/08/2015 Michael Boston, MD DOB: 04/19/1960 Age: 54 Gender: F Reported: 04/10/2015 [address] Patient Ph: [phone] MRN #: [id number] [place], [place] [address]

## 2015-04-16 NOTE — Telephone Encounter (Signed)
Hosp f/u appt-s/w patient and gave hosp f/u appt for 09/13 @ 10:45 w/Dr. Burr Medico

## 2015-04-22 ENCOUNTER — Encounter: Payer: Self-pay | Admitting: *Deleted

## 2015-04-22 ENCOUNTER — Ambulatory Visit (HOSPITAL_BASED_OUTPATIENT_CLINIC_OR_DEPARTMENT_OTHER): Payer: BLUE CROSS/BLUE SHIELD

## 2015-04-22 ENCOUNTER — Other Ambulatory Visit: Payer: Self-pay | Admitting: *Deleted

## 2015-04-22 ENCOUNTER — Telehealth: Payer: Self-pay | Admitting: Hematology

## 2015-04-22 ENCOUNTER — Ambulatory Visit (HOSPITAL_BASED_OUTPATIENT_CLINIC_OR_DEPARTMENT_OTHER): Payer: BLUE CROSS/BLUE SHIELD | Admitting: Hematology

## 2015-04-22 VITALS — BP 144/80 | HR 82 | Temp 98.4°F | Resp 20 | Ht 66.0 in | Wt 158.2 lb

## 2015-04-22 DIAGNOSIS — C787 Secondary malignant neoplasm of liver and intrahepatic bile duct: Secondary | ICD-10-CM

## 2015-04-22 DIAGNOSIS — D500 Iron deficiency anemia secondary to blood loss (chronic): Secondary | ICD-10-CM

## 2015-04-22 DIAGNOSIS — C189 Malignant neoplasm of colon, unspecified: Secondary | ICD-10-CM

## 2015-04-22 DIAGNOSIS — C187 Malignant neoplasm of sigmoid colon: Secondary | ICD-10-CM

## 2015-04-22 DIAGNOSIS — Z23 Encounter for immunization: Secondary | ICD-10-CM | POA: Diagnosis not present

## 2015-04-22 LAB — CBC WITH DIFFERENTIAL/PLATELET
BASO%: 0.3 % (ref 0.0–2.0)
BASOS ABS: 0 10*3/uL (ref 0.0–0.1)
EOS ABS: 0.3 10*3/uL (ref 0.0–0.5)
EOS%: 3.7 % (ref 0.0–7.0)
HEMATOCRIT: 33.9 % — AB (ref 34.8–46.6)
HEMOGLOBIN: 9.8 g/dL — AB (ref 11.6–15.9)
LYMPH%: 20.7 % (ref 14.0–49.7)
MCH: 17.9 pg — AB (ref 25.1–34.0)
MCHC: 28.9 g/dL — ABNORMAL LOW (ref 31.5–36.0)
MCV: 62 fL — AB (ref 79.5–101.0)
MONO#: 0.5 10*3/uL (ref 0.1–0.9)
MONO%: 5.4 % (ref 0.0–14.0)
NEUT%: 69.9 % (ref 38.4–76.8)
NEUTROS ABS: 6 10*3/uL (ref 1.5–6.5)
PLATELETS: 816 10*3/uL — AB (ref 145–400)
RBC: 5.46 10*6/uL — ABNORMAL HIGH (ref 3.70–5.45)
RDW: 38.7 % — ABNORMAL HIGH (ref 11.2–14.5)
WBC: 8.6 10*3/uL (ref 3.9–10.3)
lymph#: 1.8 10*3/uL (ref 0.9–3.3)

## 2015-04-22 LAB — COMPREHENSIVE METABOLIC PANEL (CC13)
ALBUMIN: 3.5 g/dL (ref 3.5–5.0)
ALK PHOS: 94 U/L (ref 40–150)
ALT: 11 U/L (ref 0–55)
ANION GAP: 9 meq/L (ref 3–11)
AST: 18 U/L (ref 5–34)
BILIRUBIN TOTAL: 0.43 mg/dL (ref 0.20–1.20)
BUN: 10.7 mg/dL (ref 7.0–26.0)
CALCIUM: 10.2 mg/dL (ref 8.4–10.4)
CO2: 29 mEq/L (ref 22–29)
Chloride: 105 mEq/L (ref 98–109)
Creatinine: 1.1 mg/dL (ref 0.6–1.1)
EGFR: 67 mL/min/{1.73_m2} — AB (ref >90)
GLUCOSE: 91 mg/dL (ref 70–140)
Potassium: 3.9 mEq/L (ref 3.5–5.1)
SODIUM: 143 meq/L (ref 136–145)
TOTAL PROTEIN: 8.2 g/dL (ref 6.4–8.3)

## 2015-04-22 LAB — TECHNOLOGIST REVIEW

## 2015-04-22 MED ORDER — LIDOCAINE-PRILOCAINE 2.5-2.5 % EX CREA: 1.0000 "application " | TOPICAL_CREAM | CUTANEOUS | Status: DC | PRN

## 2015-04-22 MED ORDER — ONDANSETRON HCL 8 MG PO TABS: 8.0000 mg | ORAL_TABLET | Freq: Two times a day (BID) | ORAL | Status: DC

## 2015-04-22 MED ORDER — INFLUENZA VAC SPLIT QUAD 0.5 ML IM SUSY
0.5000 mL | PREFILLED_SYRINGE | Freq: Once | INTRAMUSCULAR | Status: AC
  Administered 2015-04-22: 0.5 mL via INTRAMUSCULAR
  Filled 2015-04-22: qty 0.5

## 2015-04-22 NOTE — Telephone Encounter (Signed)
Gave and printed appt sched and avs for pt for Sept and OCT °

## 2015-04-22 NOTE — Progress Notes (Signed)
[place]  [phone] [phone]  Clinic follow up Note   Patient Care Team: Benito Mccreedy, MD as PCP - General (Internal Medicine) Juanita Craver, MD as Consulting Physician (Gastroenterology) Michael Boston, MD as Consulting Physician (General Surgery) Truitt Merle, MD as Consulting Physician (Oncology) 04/22/2015  CHIEF COMPLAINTS:  Follow up metastatic colon cancer      Oncology History   Metastatic colon cancer to liver   Staging form: Colon and Rectum, AJCC 7th Edition     Pathologic stage from 04/08/2015: T3, N0, M1 - Signed by Truitt Merle, MD on 04/22/2015  Presented to ER with intractable N/V; intermittent rectal bleeding X 3 months      Metastatic colon cancer to liver   04/03/2015 Procedure Colonoscopy showed a obstructing sigmoid rectal mass. Biopsy showed adenocarcinoma.   04/06/2015 Tumor Marker CEA=467.3 / CA19.9=1605   04/06/2015 Imaging CT chest, abdomen and pelvis with contrast showed sigmoid colon rectal mass, multiple (4) liver metastasis, with the largest 9.1 x 6.1 cm mass in the left lobe..   04/08/2015 Initial Diagnosis Metastatic colon cancer to liver   04/08/2015 Surgery Laparoscopic sigmoid colectomy, liver biopsy, port cath insertion, by Dr. Johney Maine    04/08/2015 Pathology Results Sigmoid colon segmental resection showed adenocarcinoma with mucinous features, pT 3, 15 lymph nodes all negative, surgical margins were negative. Liver biopsy showed metastatic adenocarcinoma.     HISTORY OF PRESENTING ILLNESS:  Christina [name] 54 y.o. female with past medical history of endometriosis, uterine fibroids, thalassemia trait, iron deficient anemia, who was recently found to have a sigmoid rectal cancer. I initially saw her in the hospital, she is here for the first follow-up.  She has been having intermittent rectal bleeding for the past 3 months. She thought it was related to her endometriosis, did not seek medical attention. She also  has intermittent mild nausea, especially in the morning, and left lower quadrant abdominal pain. She was found to have profound anemia with hemoglobin 6.5 last week, and received blood transfusion. She was referred to gastroenterologist Dr. Collene Mares last week, underwent colonoscopy 2 days ago, which showed a obstructing sigmoid rectal mass, per patient, the colonoscopy report is not available today. Biopsy was done, but the pathology report is still pending.  She developed severe nausea, and vomited several times with clear gastric liquid. She called [name], and I was told to come to [facility] emergency room by on-call physician Dr. Carlean Purl. She had a CT scan done in the emergency room today.  Her appetite was fairly normal up to 2 days ago before recent hospital admission, when she was found to have a colorectal mass. She lost about 45 pounds in the past few weeks. 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 family history was positive for colon cancer in her maternal cousin. She is married, lives with her husband, no children.  INTERIM HISTORY She returns for follow up. She has been recovering well from her surgery. She still has mild to moderate RUQ pain, which she feels is from the liver biopsy. She only took a few pills of pain killer, she uses ibuprofen most of time. she is mildly constipated, and she has tried multiple laxatives. she has some hot flush during day since the surgery, no fever or chills. Appetite has been good, she is eating well.   MEDICAL HISTORY:  Past Medical History  Diagnosis Date  . Hypertension   . Endometriosis   . Thalassanemia  SURGICAL HISTORY: Past Surgical History  Procedure Laterality Date  . Myomectomy      Gyn in [place]  . Diagnostic laparoscopy      Endometriosis  . Colon resection N/A 04/08/2015    Procedure: LAPAROSCOPIC  RESECTION OF PART OF  SIGMOID COLON;  Surgeon: Michael Boston, MD;   Location: [facility] OR[facility];  Service: General;  Laterality: N/A;  . Liver biopsy N/A 04/08/2015    Procedure: CORE NEEDLE LIVER BIOPSY;  Surgeon: Michael Boston, MD;  Location: [facility];  Service: General;  Laterality: N/A;  . Portacath placement N/A 04/08/2015    Procedure: INSERTION PORT-A-CATH;  Surgeon: Michael Boston, MD;  Location: [facility];  Service: General;  Laterality: N/A;    SOCIAL HISTORY: Social History   Social History  . Marital Status: Married    Spouse Name: N/A  . Number of Children: N/A  . Years of Education: N/A   Occupational History  . Internal Medicine doctor     Works in [employer]  . Smoking status: Never Smoker   . Smokeless tobacco: Not on file  . Alcohol Use: Yes  . Drug Use: No  . Sexual Activity: Not on file   Other Topics Concern  . Not on file   Social History Narrative   Married, husband [name]    FAMILY HISTORY: History reviewed. No pertinent family history.  ALLERGIES:  is allergic to lactose intolerance (gi); milk-related compounds; and nsaids.  MEDICATIONS:  Current Outpatient Prescriptions  Medication Sig Dispense Refill  . NIFEdipine (PROCARDIA-XL/ADALAT CC) 30 MG 24 hr tablet Take 30 mg by mouth daily.    . ondansetron (ZOFRAN) 4 MG tablet Take 1 tablet (4 mg total) by mouth every 6 (six) hours as needed for nausea. 20 tablet 0  . oxyCODONE (OXY IR/ROXICODONE) 5 MG immediate release tablet Take 1-2 tablets (5-10 mg total) by mouth every 4 (four) hours as needed for moderate pain. 30 tablet 0   No current facility-administered medications for this visit.    REVIEW OF SYSTEMS:   Constitutional: Denies fevers, chills or abnormal night sweats, (+) hot flash and weight loss Eyes: Denies blurriness of vision, double vision or watery eyes Ears, nose, mouth, throat, and face: Denies mucositis or sore throat Respiratory: Denies cough, dyspnea or wheezes Cardiovascular: Denies palpitation, chest discomfort or lower  extremity swelling Gastrointestinal:  Denies nausea, heartburn, (+) RUQ pain, and mild constipation Skin: Denies abnormal skin rashes Lymphatics: Denies new lymphadenopathy or easy bruising Neurological:Denies numbness, tingling or new weaknesses Behavioral/Psych: Mood is stable, no new changes  All other systems were reviewed with the patient and are negative.  PHYSICAL EXAMINATION: ECOG PERFORMANCE STATUS: 1 - Symptomatic but completely ambulatory  Filed Vitals:   04/22/15 1139  BP: 144/80  Pulse: 82  Temp: 98.4 F (36.9 C)  Resp: 20   Filed Weights   04/22/15 1139  Weight: 158 lb 3.2 oz (71.759 kg)    GENERAL:alert, no distress and comfortable SKIN: skin color, texture, turgor are normal, no rashes or significant lesions EYES: normal, conjunctiva are pink and non-injected, sclera clear OROPHARYNX:no exudate, no erythema and lips, buccal mucosa, and tongue normal  NECK: supple, thyroid normal size, non-tender, without nodularity LYMPH:  no palpable lymphadenopathy in the cervical, axillary or inguinal LUNGS: clear to auscultation and percussion with normal breathing effort HEART: regular rate & rhythm and no murmurs and no lower extremity edema ABDOMEN:abdomen soft, non-tender and normal bowel sounds Musculoskeletal:no cyanosis of digits and no  clubbing  PSYCH: alert & oriented x 3 with fluent speech NEURO: no focal motor/sensory deficits  LABORATORY DATA:  I have reviewed the data as listed CBC Latest Ref Rng 04/22/2015 04/11/2015 04/10/2015  WBC 3.9 - 10.3 10e3/uL 8.6 9.8 11.5(H)  Hemoglobin 11.6 - 15.9 g/dL 9.8(L) 7.8(L) 8.4(L)  Hematocrit 34.8 - 46.6 % 33.9(L) 27.3(L) 28.8(L)  Platelets 145 - 400 10e3/uL 816(H) 461(H) 487(H)    CMP Latest Ref Rng 04/22/2015 04/11/2015 04/10/2015  Glucose 70 - 140 mg/dl 91 94 96  BUN 7.0 - 26.0 mg/dL 10._0 Creatinine 0.6 - 1.1 mg/dL 1.1 0.86 0.80  Sodium 136 - 145 mEq/L 143 139 138  Potassium 3.5 - 5.1 mEq/L 3.9 3.5 3.5  Chloride  101 - 111 mmol/L - 103 103  CO2 22 - 29 mEq/L _1 Calcium 8.4 - 10.4 mg/dL 10.2 9.0 8.8(L)  Total Protein 6.4 - 8.3 g/dL 8.2 - -  Total Bilirubin 0.20 - 1.20 mg/dL 0.43 - -  Alkaline Phos 40 - 150 U/L 94 - -  AST 5 - 34 U/L 18 - -  ALT 0 - 55 U/L 11 - -      Pathology report Diagnosis 04/08/2015 1. Liver, needle/core biopsy, ? cancer - METASTATIC ADENOCARCINOMA. 2. Colon, segmental resection for tumor, sigmoid colon mass open end proximal - COLONIC ADENOCARCINOMA WITH MUCINOUS FEATURES EXTENDING INTO PERICOLONIC ADIPOSE TISSUE AND SUBSEROSAL CONNECTIVE TISSUE. - MARGINS NOT INVOLVED. - FIFTEEN BENIGN LYMPH NODES (0/15). 3. Colon, resection margin (donut), distal anastomic ring - BENIGN COLON. - NO EVIDENCE OF MALIGNANCY.  Microscopic Comment Specimen: Sigmoid colon with liver biopsy and anastomotic rings. Procedure: Segmental resection with liver biopsy. Tumor site: Distal sigmoid. Specimen integrity: Intact. Macroscopic intactness of mesorectum: N/A Macroscopic tumor perforation: No. Invasive tumor: Maximum size: 8 cm Histologic type(s): Colorectal adenocarcinoma with mucinous features. Histologic grade and differentiation: G2: moderately differentiated/low grade Type of polyp in which invasive carcinoma arose: No residual polyp. Microscopic extension of invasive tumor: Into pericolonic adipose tissue and subserosal connective tissue. Lymph-Vascular invasion: Present. Peri-neural invasion: Present. Tumor deposit(s) (discontinuous extramural extension): No. Resection margins: Proximal margin: Free of tumor. Distal margin: Free of tumor. Circumferential (radial) (posterior ascending, posterior descending; lateral and posterior mid-rectum; and entire lower 1/3 rectum): N/A Mesenteric margin (sigmoid and transverse): Free of tumor. Distance closest margin (if all above margins negative): 2 cm from mesenteric margin. Treatment effect (neo-adjuvant therapy):  No. Additional polyp(s): No. Non-neoplastic findings: None. Lymph nodes: number examined - 15; number positive: 0 Pathologic Staging: pT3, pN0, pM1 Ancillary studies: MMR pending. (JDP:gt, 04/10/15) 2. Mismatch Repair (MMR) Protein Immunohistochemistry (IHC) IHC Expression Result: MLH1: Preserved nuclear expression (greater 50% tumor expression) MSH2: Preserved nuclear expression (greater 50% tumor expression) MSH6: Preserved nuclear expression (greater 50% tumor expression) PMS2: Preserved nuclear expression (greater 50% tumor expression) * Internal control demonstrates intact nuclear expression Interpretation: NORMAL   RADIOGRAPHIC STUDIES: I have personally reviewed the radiological images as listed and agreed with the findings in the report.  Ct Chest abdomen and pelvis W Contrast 04/06/2015    IMPRESSION: No acute abnormality identified within the chest. No evidence of metastatic disease.  Several enhancing masses in the liver consistent with liver metastasis.  Masses within the pelvis likely of the Uterine origin. Given the findings within the liver, malignant neoplasm is not excluded.  A conglomerate of matted bowel loops in the mid lower abdomen. There is no evidence of small bowel obstruction.  Electronically Signed: By: Abelardo Diesel  M.D. On: 04/06/2015 11:49    Ct Abdomen Pelvis W Contrast  04/06/2015   ADDENDUM REPORT: 04/06/2015 13:18  ADDENDUM: Upon further evaluation, The masslike lesion in the pelvis previously question matted small bowel loops represent 9 cm sigmoid colon cancer.   Electronically Signed   By: Abelardo Diesel M.D.   On: 04/06/2015 13:18      ASSESSMENT & PLAN:  54 year old African-American female, with past medical history of hypertension, thalassemia trait, iron deficient anemia, uterine fibroids, endometriosis, who presents with intermittent rectal bleeding, mild nausea, and mild intermittent left lower quadrant abdominal pain.   1. Sigmoid rectal  adenocarcinoma, with liver metastases, pT3N0M1, MMR normal, Foundation One [place] testing pending -I reviewed her surgical pathology findings with her in great details. Her liver biopsy confirmed metastatic adenocarcinoma  From colon cancer. Her primary sigmoid rectal tumor has been completely removed. -We again reviewed her CT findings, it showed at least 4 liver metastatic lesion, with the largest one measuring 9.1 cm in the left upper lobe. No other distant metastasis -We reviewed the natural history of metastatic colon cancer, and incurable nature at this is stage, giving her large metastatic disease including the liver. We also discussed that if she had excellent response to chemotherapy, I'll present her restaging CT scan in our GI tumor Board, to see if she is a candidate for liver surgery or liver targeted therapy to maximally controlled her disease. -Given her young age, good performance status, I recommend systemic chemotherapy. -We discussed different chemotherapy regimen, including FOLFOX and FOFIRI, and the role of Avastin and EGFR inhibitor if her tumor is negative for K-ras and NRAS mutation. She opted FOLFIRI.  -Her request her tumor to be tested for Foundation one last week, the results are still pending -She is recovering well from the surgery, but still has moderate pain from the surgery and biopsy, I'll give her more time to recover, and tentatively schedule her first chemotherapy treatment for September 26 -I'll add on avastin or EGFR inhibitor panitumumab with the second or third cycle chemo -chemo class next week   2. Iron deficient Anemia -Secondary to GI bleeding and iron deficiency -Ferritin level was 8, consistent with iron deficiency from GI bleeding -She received Feraheme 510 mg in the hospital, I'll set up her second infusion in our consent for next week  3. RUQ abdominal pain -Likely related to her colon surgery and liver biopsy -She has oxycodone at home, but does  not use much, she takes NSAIDs for the pain  4. Weight loss and malnutrition -I encouraged her to increase calorie and protein intake, and gain some weight back -Nutrition consult  Plan -chemo class next week -Feraheme next week -first FOLFIRI on 9/26  -Nutrition consult -Lab today   All questions were answered. The patient knows to call the clinic with any problems, questions or concerns. I spent 55 minutes counseling the patient face to face. The total time spent in the appointment was 60 minutes and more than 50% was on counseling.  All questions were answered. The patient knows to call the clinic with any problems, questions or concerns. I spent 45 minutes counseling the patient face to face. The total time spent in the appointment was 55 minutes and more than 50% was on counseling.     Truitt Merle, MD 04/22/2015 12:06 PM

## 2015-04-22 NOTE — Progress Notes (Signed)
Oncology Nurse Navigator Documentation  Oncology Nurse Navigator Flowsheets 04/22/2015  Referral date to RadOnc/MedOnc 04/06/2015  Navigator Encounter Type Initial MedOnc  Patient Visit Type Medonc  Treatment Phase Treatment planning  Barriers/Navigation Needs Family concerns-treatment options, side effects  Interventions Referrals  Referrals Nutrition/dietician  Support Groups/Services GI  Time Spent with Patient 75  Met with patient, husband Iona Beard and sister, Terrence Dupont during new patient visit. Explained the role of the GI Nurse Navigator and provided New Patient Packet with information on: 1. Colorectal cancer 2. Support groups 3. Advanced Directives 4. Fall Safety Plan Answered questions, reviewed current treatment plan using TEACH back and provided emotional support. Provided copy of current treatment plan. 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 Escorted patient and family to scheduler, then to lab.  Merceda Elks, RN, BSN GI Oncology [place]

## 2015-04-23 ENCOUNTER — Telehealth: Payer: Self-pay | Admitting: *Deleted

## 2015-04-23 LAB — CEA: CEA: 299.4 ng/mL — AB (ref 0.0–5.0)

## 2015-04-23 NOTE — Telephone Encounter (Signed)
VM left by patient wishing to discuss her chemotherapy schedule. Called back and left VM that I will call her again tomorrow. Looks like we could consolidate the iron and chemo class into same day.

## 2015-04-23 NOTE — Telephone Encounter (Signed)
Per staff message and POF I have scheduled appts. Advised scheduler of appts. JMW  

## 2015-04-24 ENCOUNTER — Telehealth: Payer: Self-pay | Admitting: *Deleted

## 2015-04-24 NOTE — Telephone Encounter (Signed)
Oncology Nurse Navigator Documentation  Oncology Nurse Navigator Flowsheets 04/24/2015  Referral date to RadOnc/MedOnc -  Navigator Encounter Type Telephone  Patient Visit Type -  Treatment Phase Treatment  Barriers/Navigation Needs Family concerns  Interventions Coordination of Care--chemo appointments  Referrals -  Coordination of Care POF to scheduler to move feraheme to 9/22 and chemo to Thursday (off Th & Fri)  Support Groups/Services -  Time Spent with Patient 10

## 2015-04-25 ENCOUNTER — Telehealth: Payer: Self-pay | Admitting: Hematology

## 2015-04-25 ENCOUNTER — Telehealth: Payer: Self-pay | Admitting: *Deleted

## 2015-04-25 NOTE — Telephone Encounter (Signed)
s.w. pt and advised on appts....pt ok and aware °

## 2015-04-25 NOTE — Telephone Encounter (Signed)
Per staff message and POF I have scheduled appts. Advised scheduler of appts and no available on 9/22 moved to 9/23. JMW

## 2015-04-29 ENCOUNTER — Encounter (HOSPITAL_COMMUNITY): Payer: Self-pay

## 2015-04-29 ENCOUNTER — Ambulatory Visit: Payer: BLUE CROSS/BLUE SHIELD

## 2015-04-29 ENCOUNTER — Other Ambulatory Visit: Payer: Self-pay | Admitting: *Deleted

## 2015-04-30 ENCOUNTER — Telehealth: Payer: Self-pay | Admitting: Hematology

## 2015-04-30 ENCOUNTER — Telehealth: Payer: Self-pay | Admitting: *Deleted

## 2015-04-30 NOTE — Telephone Encounter (Signed)
Left message to confirmed appointment for 09/27 moved to 09/29 with Chemo.

## 2015-04-30 NOTE — Telephone Encounter (Signed)
VM from patient to follow up on reschedule of her chemotherapy from 9/26 to 9/29 due to her work schedule.  Forwarded this message to scheduler (had already made this request and sent POF).

## 2015-05-01 ENCOUNTER — Other Ambulatory Visit: Payer: BLUE CROSS/BLUE SHIELD

## 2015-05-02 ENCOUNTER — Ambulatory Visit (HOSPITAL_BASED_OUTPATIENT_CLINIC_OR_DEPARTMENT_OTHER): Payer: BLUE CROSS/BLUE SHIELD

## 2015-05-02 ENCOUNTER — Other Ambulatory Visit: Payer: Self-pay | Admitting: Hematology

## 2015-05-02 VITALS — BP 141/82 | HR 67 | Temp 98.5°F | Resp 20

## 2015-05-02 DIAGNOSIS — D500 Iron deficiency anemia secondary to blood loss (chronic): Secondary | ICD-10-CM | POA: Diagnosis not present

## 2015-05-02 DIAGNOSIS — D649 Anemia, unspecified: Secondary | ICD-10-CM

## 2015-05-02 MED ORDER — SODIUM CHLORIDE 0.9 % IJ SOLN
10.0000 mL | INTRAMUSCULAR | Status: DC | PRN
  Administered 2015-05-02: 10 mL via INTRAVENOUS
  Filled 2015-05-02: qty 10

## 2015-05-02 MED ORDER — SODIUM CHLORIDE 0.9 % IV SOLN
510.0000 mg | Freq: Once | INTRAVENOUS | Status: AC
  Administered 2015-05-02: 510 mg via INTRAVENOUS
  Filled 2015-05-02: qty 17

## 2015-05-02 MED ORDER — HEPARIN SOD (PORK) LOCK FLUSH 100 UNIT/ML IV SOLN
500.0000 [IU] | Freq: Once | INTRAVENOUS | Status: AC
  Administered 2015-05-02: 500 [IU] via INTRAVENOUS
  Filled 2015-05-02: qty 5

## 2015-05-02 MED ORDER — SODIUM CHLORIDE 0.9 % IV SOLN
Freq: Once | INTRAVENOUS | Status: AC
  Administered 2015-05-02: 15:00:00 via INTRAVENOUS

## 2015-05-02 NOTE — Progress Notes (Signed)
Patient received Feraheme today. Post vital signs stable.  Patient observed for 30 minutes post infusion.  Discharged home with no complaints.

## 2015-05-02 NOTE — Patient Instructions (Signed)

## 2015-05-05 ENCOUNTER — Ambulatory Visit: Payer: BLUE CROSS/BLUE SHIELD

## 2015-05-05 ENCOUNTER — Ambulatory Visit: Payer: BLUE CROSS/BLUE SHIELD | Admitting: Hematology

## 2015-05-05 ENCOUNTER — Encounter: Payer: BLUE CROSS/BLUE SHIELD | Admitting: Nutrition

## 2015-05-08 ENCOUNTER — Encounter: Payer: Self-pay | Admitting: *Deleted

## 2015-05-08 ENCOUNTER — Ambulatory Visit (HOSPITAL_BASED_OUTPATIENT_CLINIC_OR_DEPARTMENT_OTHER): Payer: BLUE CROSS/BLUE SHIELD | Admitting: Hematology

## 2015-05-08 ENCOUNTER — Telehealth: Payer: Self-pay | Admitting: Hematology

## 2015-05-08 ENCOUNTER — Encounter: Payer: Self-pay | Admitting: Hematology

## 2015-05-08 ENCOUNTER — Ambulatory Visit (HOSPITAL_BASED_OUTPATIENT_CLINIC_OR_DEPARTMENT_OTHER): Payer: BLUE CROSS/BLUE SHIELD

## 2015-05-08 ENCOUNTER — Telehealth: Payer: Self-pay | Admitting: *Deleted

## 2015-05-08 VITALS — BP 167/87 | HR 73 | Temp 98.6°F | Resp 18 | Ht 66.0 in | Wt 159.6 lb

## 2015-05-08 DIAGNOSIS — C189 Malignant neoplasm of colon, unspecified: Secondary | ICD-10-CM

## 2015-05-08 DIAGNOSIS — D509 Iron deficiency anemia, unspecified: Secondary | ICD-10-CM

## 2015-05-08 DIAGNOSIS — Z5111 Encounter for antineoplastic chemotherapy: Secondary | ICD-10-CM | POA: Diagnosis not present

## 2015-05-08 DIAGNOSIS — C787 Secondary malignant neoplasm of liver and intrahepatic bile duct: Secondary | ICD-10-CM

## 2015-05-08 DIAGNOSIS — C19 Malignant neoplasm of rectosigmoid junction: Secondary | ICD-10-CM

## 2015-05-08 LAB — CBC WITH DIFFERENTIAL/PLATELET
BASO%: 0.3 % (ref 0.0–2.0)
Basophils Absolute: 0 10*3/uL (ref 0.0–0.1)
EOS ABS: 0.6 10*3/uL — AB (ref 0.0–0.5)
EOS%: 7.5 % — ABNORMAL HIGH (ref 0.0–7.0)
HCT: 36.8 % (ref 34.8–46.6)
HGB: 10.7 g/dL — ABNORMAL LOW (ref 11.6–15.9)
LYMPH%: 19.6 % (ref 14.0–49.7)
MCH: 19.8 pg — ABNORMAL LOW (ref 25.1–34.0)
MCHC: 29.1 g/dL — AB (ref 31.5–36.0)
MCV: 68.1 fL — AB (ref 79.5–101.0)
MONO#: 0.4 10*3/uL (ref 0.1–0.9)
MONO%: 5.8 % (ref 0.0–14.0)
NEUT#: 4.9 10*3/uL (ref 1.5–6.5)
NEUT%: 66.8 % (ref 38.4–76.8)
PLATELETS: 419 10*3/uL — AB (ref 145–400)
RBC: 5.4 10*6/uL (ref 3.70–5.45)
WBC: 7.4 10*3/uL (ref 3.9–10.3)
lymph#: 1.4 10*3/uL (ref 0.9–3.3)

## 2015-05-08 LAB — COMPREHENSIVE METABOLIC PANEL (CC13)
ALT: 17 U/L (ref 0–55)
ANION GAP: 8 meq/L (ref 3–11)
AST: 26 U/L (ref 5–34)
Albumin: 3.9 g/dL (ref 3.5–5.0)
Alkaline Phosphatase: 89 U/L (ref 40–150)
BUN: 14.4 mg/dL (ref 7.0–26.0)
CHLORIDE: 104 meq/L (ref 98–109)
CO2: 29 meq/L (ref 22–29)
Calcium: 10.3 mg/dL (ref 8.4–10.4)
Creatinine: 1 mg/dL (ref 0.6–1.1)
EGFR: 75 mL/min/{1.73_m2} — AB (ref >90)
Glucose: 97 mg/dl (ref 70–140)
POTASSIUM: 4.1 meq/L (ref 3.5–5.1)
Sodium: 141 mEq/L (ref 136–145)
Total Bilirubin: 0.61 mg/dL (ref 0.20–1.20)
Total Protein: 8.2 g/dL (ref 6.4–8.3)

## 2015-05-08 LAB — TECHNOLOGIST REVIEW

## 2015-05-08 MED ORDER — DEXTROSE 5 % IV SOLN
180.0000 mg/m2 | Freq: Once | INTRAVENOUS | Status: AC
  Administered 2015-05-08: 330 mg via INTRAVENOUS
  Filled 2015-05-08: qty 16.5

## 2015-05-08 MED ORDER — FLUOROURACIL CHEMO INJECTION 2.5 GM/50ML
400.0000 mg/m2 | Freq: Once | INTRAVENOUS | Status: AC
Start: 2015-05-08 — End: 2015-05-08
  Administered 2015-05-08: 750 mg via INTRAVENOUS
  Filled 2015-05-08: qty 15

## 2015-05-08 MED ORDER — SODIUM CHLORIDE 0.9 % IV SOLN
Freq: Once | INTRAVENOUS | Status: AC
  Administered 2015-05-08: 14:00:00 via INTRAVENOUS
  Filled 2015-05-08: qty 8

## 2015-05-08 MED ORDER — ATROPINE SULFATE 1 MG/ML IJ SOLN
INTRAMUSCULAR | Status: AC
  Filled 2015-05-08: qty 1

## 2015-05-08 MED ORDER — ATROPINE SULFATE 1 MG/ML IJ SOLN
0.5000 mg | Freq: Once | INTRAMUSCULAR | Status: AC | PRN
  Administered 2015-05-08: 0.5 mg via INTRAVENOUS

## 2015-05-08 MED ORDER — LEUCOVORIN CALCIUM INJECTION 350 MG
400.0000 mg/m2 | Freq: Once | INTRAMUSCULAR | Status: AC
  Administered 2015-05-08: 732 mg via INTRAVENOUS
  Filled 2015-05-08: qty 36.6

## 2015-05-08 MED ORDER — SODIUM CHLORIDE 0.9 % IV SOLN: Freq: Once | INTRAVENOUS | Status: DC

## 2015-05-08 MED ORDER — SODIUM CHLORIDE 0.9 % IV SOLN
2400.0000 mg/m2 | INTRAVENOUS | Status: DC
  Administered 2015-05-08: 4400 mg via INTRAVENOUS
  Filled 2015-05-08: qty 88

## 2015-05-08 NOTE — Telephone Encounter (Signed)
per pof to sch pt appt-sent MW email to sch pt trmt-gave pt copy of avs °

## 2015-05-08 NOTE — Progress Notes (Signed)
Oncology Nurse Navigator Documentation  Oncology Nurse Navigator Flowsheets 05/08/2015  Referral date to RadOnc/MedOnc -  Navigator Encounter Type Treatment  Patient Visit Type Medonc  Treatment Phase First Chemo Tx-FOLFIRI/AVASTIN   Barriers/Navigation Needs No barriers at this time  Interventions None required  Referrals -  Coordination of Care -  Support Groups/Services -  Time Spent with Patient 5  Met with patient and husband during initial chemotherapy treatment to provide support and assess for needs to promote continuity of care. No immediate needs-has anti-emetic and EMLA cream.  Merceda Elks, RN, BSN GI Oncology [facility]

## 2015-05-08 NOTE — Telephone Encounter (Signed)
Per staff message and POF I have scheduled appts. Advised scheduler of appts. JMW  

## 2015-05-08 NOTE — Patient Instructions (Signed)
[facility] Discharge Instructions for Patients Receiving Chemotherapy  Today you received the following chemotherapy agents Adrucil, Leucovorin, Camptosar  To help prevent nausea and vomiting after your treatment, we encourage you to take your nausea medication as directed   If you develop nausea and vomiting that is not controlled by your nausea medication, call the clinic.   BELOW ARE SYMPTOMS THAT SHOULD BE REPORTED IMMEDIATELY:  *FEVER GREATER THAN 100.5 F  *CHILLS WITH OR WITHOUT FEVER  NAUSEA AND VOMITING THAT IS NOT CONTROLLED WITH YOUR NAUSEA MEDICATION  *UNUSUAL SHORTNESS OF BREATH  *UNUSUAL BRUISING OR BLEEDING  TENDERNESS IN MOUTH AND THROAT WITH OR WITHOUT PRESENCE OF ULCERS  *URINARY PROBLEMS  *BOWEL PROBLEMS  UNUSUAL RASH Items with * indicate a potential emergency and should be followed up as soon as possible.  Feel free to call the clinic you have any questions or concerns. The clinic phone number is [phone].  Please show the [facility] at check-in to the Emergency Department and triage nurse.

## 2015-05-08 NOTE — Progress Notes (Signed)
[facility]  [phone] [phone]  Clinic follow up Note   Patient Care Team: Benito Mccreedy, MD as PCP - General (Internal Medicine) Juanita Craver, MD as Consulting Physician (Gastroenterology) Michael Boston, MD as Consulting Physician (General Surgery) Truitt Merle, MD as Consulting Physician (Oncology) 05/08/2015  CHIEF COMPLAINTS:  Follow up metastatic colon cancer      Oncology History   Metastatic colon cancer to liver   Staging form: Colon and Rectum, AJCC 7th Edition     Pathologic stage from 04/08/2015: T3, N0, M1 - Signed by Truitt Merle, MD on 04/22/2015  Presented to ER with intractable N/V; intermittent rectal bleeding X 3 months      Metastatic colon cancer to liver   04/03/2015 Procedure Colonoscopy showed a obstructing sigmoid rectal mass. Biopsy showed adenocarcinoma.   04/06/2015 Tumor Marker CEA=467.3 / CA19.9=1605   04/06/2015 Imaging CT chest, abdomen and pelvis with contrast showed sigmoid colon rectal mass, multiple (4) liver metastasis, with the largest 9.1 x 6.1 cm mass in the left lobe..   04/08/2015 Initial Diagnosis Metastatic colon cancer to liver   04/08/2015 Surgery Laparoscopic sigmoid colectomy, liver biopsy, port cath insertion, by Dr. Johney Maine    04/08/2015 Pathology Results Sigmoid colon segmental resection showed adenocarcinoma with mucinous features, pT 3, 15 lymph nodes all negative, surgical margins were negative. Liver biopsy showed metastatic adenocarcinoma.   05/08/2015 -  Chemotherapy FOLFIRI every 2 weeks, Avastin was added on from second cycle      HISTORY OF PRESENTING ILLNESS:  Christina [name] 54 y.o. female with past medical history of endometriosis, uterine fibroids, thalassemia trait, iron deficient anemia, who was recently found to have a sigmoid rectal cancer. I initially saw her in the hospital, she is here for the first follow-up.  She has been having intermittent rectal bleeding for the past 3 months.  She thought it was related to her endometriosis, did not seek medical attention. She also has intermittent mild nausea, especially in the morning, and left lower quadrant abdominal pain. She was found to have profound anemia with hemoglobin 6.5 last week, and received blood transfusion. She was referred to gastroenterologist Dr. Collene Mares last week, underwent colonoscopy 2 days ago, which showed a obstructing sigmoid rectal mass, per patient, the colonoscopy report is not available today. Biopsy was done, but the pathology report is still pending.  She developed severe nausea, and vomited several times with clear gastric liquid. She called [name], and I was told to come to [facility] emergency room by on-call physician Dr. Carlean Purl. She had a CT scan done in the emergency room today.  Her appetite was fairly normal up to 2 days ago before recent hospital admission, when she was found to have a colorectal mass. She lost about 45 pounds in the past few weeks. 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 family history was positive for colon cancer in her maternal cousin. She is married, lives with her husband, no children.  INTERIM HISTORY She returns for follow up. She has recovered better from her surgery. Her abdominal pain from incision are near resolved. She still has mild discomfort at the liver biopsy site, no other pains. Her appetite and eating a fairly good now. Her palmar is normal. Her energy level has also much improved, she has been back to work part-time since last week. Her husband drove to her to work, 2 hours one way. No fever or chills or other complaints.  MEDICAL HISTORY:  Past Medical History  Diagnosis Date  . Hypertension   . Endometriosis   . Thalassanemia     SURGICAL HISTORY: Past Surgical History  Procedure Laterality Date  . Myomectomy      Gyn in [place]  . Diagnostic laparoscopy      Endometriosis  . Colon  resection N/A 04/08/2015    Procedure: LAPAROSCOPIC  RESECTION OF PART OF  SIGMOID COLON;  Surgeon: Michael Boston, MD;  Location: [facility];  Service: General;  Laterality: N/A;  . Liver biopsy N/A 04/08/2015    Procedure: CORE NEEDLE LIVER BIOPSY;  Surgeon: Michael Boston, MD;  Location: [facility];  Service: General;  Laterality: N/A;  . Portacath placement N/A 04/08/2015    Procedure: INSERTION PORT-A-CATH;  Surgeon: Michael Boston, MD;  Location: [facility];  Service: General;  Laterality: N/A;    SOCIAL HISTORY: Social History   Social History  . Marital Status: Married    Spouse Name: N/A  . Number of Children: N/A  . Years of Education: N/A   Occupational History  . Internal Medicine doctor     Works in [employer]  . Smoking status: Never Smoker   . Smokeless tobacco: Not on file  . Alcohol Use: Yes     Comment: socail drinker   . Drug Use: No  . Sexual Activity: Not on file   Other Topics Concern  . Not on file   Social History Narrative   Married, husband [name] (married X 2 years)   No children   IM Physician in [place]: Family History  Problem Relation Age of Onset  . Anesthesia problems Cousin 37    maternal cousin, colon cancer   . Clotting disorder Maternal Grandmother     ALLERGIES:  is allergic to lactose intolerance (gi); milk-related compounds; and nsaids.  MEDICATIONS:  Current Outpatient Prescriptions  Medication Sig Dispense Refill  . lidocaine-prilocaine (EMLA) cream Apply 1 application topically as needed. 30 g 6  . NIFEdipine (PROCARDIA-XL/ADALAT CC) 30 MG 24 hr tablet Take 30 mg by mouth daily.    . ondansetron (ZOFRAN) 4 MG tablet Take 1 tablet (4 mg total) by mouth every 6 (six) hours as needed for nausea. 20 tablet 0  . oxyCODONE (OXY IR/ROXICODONE) 5 MG immediate release tablet Take 1-2 tablets (5-10 mg total) by mouth every 4 (four) hours as needed for moderate pain. 30 tablet 0  .  ondansetron (ZOFRAN) 8 MG tablet Take 1 tablet (8 mg total) by mouth 2 (two) times daily. Start the day after chemo for 3 days. Then take as needed for nausea or vomiting. (Patient not taking: Reported on 05/08/2015) 30 tablet 1   No current facility-administered medications for this visit.   Facility-Administered Medications Ordered in Other Visits  Medication Dose Route Frequency Provider Last Rate Last Dose  . 0.9 %  sodium chloride infusion   Intravenous Once Truitt Merle, MD      . fluorouracil (ADRUCIL) 4,400 mg in sodium chloride 0.9 % 62 mL chemo infusion  2,400 mg/m2 (Treatment Plan Actual) Intravenous 1 day or 1 dose Truitt Merle, MD   4,400 mg at 05/08/15 1604    REVIEW OF SYSTEMS:   Constitutional: Denies fevers, chills or abnormal night sweats, (+) hot flash and weight loss Eyes: Denies blurriness of vision, double vision or watery eyes Ears, nose, mouth, throat, and face: Denies mucositis or sore throat Respiratory: Denies cough, dyspnea or wheezes  Cardiovascular: Denies palpitation, chest discomfort or lower extremity swelling Gastrointestinal:  Denies nausea, heartburn, (+) RUQ pain, and mild constipation Skin: Denies abnormal skin rashes Lymphatics: Denies new lymphadenopathy or easy bruising Neurological:Denies numbness, tingling or new weaknesses Behavioral/Psych: Mood is stable, no new changes  All other systems were reviewed with the patient and are negative.  PHYSICAL EXAMINATION: ECOG PERFORMANCE STATUS: 1 - Symptomatic but completely ambulatory  Filed Vitals:   05/08/15 1214  BP: 167/87  Pulse: 73  Temp: 98.6 F (37 C)  Resp: 18   Filed Weights   05/08/15 1214  Weight: 159 lb 9.6 oz (72.394 kg)    GENERAL:alert, no distress and comfortable SKIN: skin color, texture, turgor are normal, no rashes or significant lesions EYES: normal, conjunctiva are pink and non-injected, sclera clear OROPHARYNX:no exudate, no erythema and lips, buccal mucosa, and tongue normal    NECK: supple, thyroid normal size, non-tender, without nodularity LYMPH:  no palpable lymphadenopathy in the cervical, axillary or inguinal LUNGS: clear to auscultation and percussion with normal breathing effort HEART: regular rate & rhythm and no murmurs and no lower extremity edema ABDOMEN:abdomen soft, non-tender and normal bowel sounds Musculoskeletal:no cyanosis of digits and no clubbing  PSYCH: alert & oriented x 3 with fluent speech NEURO: no focal motor/sensory deficits  LABORATORY DATA:  I have reviewed the data as listed CBC Latest Ref Rng 05/08/2015 04/22/2015 04/11/2015  WBC 3.9 - 10.3 10e3/uL 7.4 8.6 9.8  Hemoglobin 11.6 - 15.9 g/dL 10.7(L) 9.8(L) 7.8(L)  Hematocrit 34.8 - 46.6 % 36.8 33.9(L) 27.3(L)  Platelets 145 - 400 10e3/uL 419(H) 816(H) 461(H)    CMP Latest Ref Rng 05/08/2015 04/22/2015 04/11/2015  Glucose 70 - 140 mg/dl 97 91 94  BUN 7.0 - 26.0 mg/dL 14.4 10.7 11  Creatinine 0.6 - 1.1 mg/dL 1.0 1.1 0.86  Sodium 136 - 145 mEq/L 141 143 139  Potassium 3.5 - 5.1 mEq/L 4.1 3.9 3.5  Chloride 101 - 111 mmol/L - - 103  CO2 22 - 29 mEq/L _0 Calcium 8.4 - 10.4 mg/dL 10.3 10.2 9.0  Total Protein 6.4 - 8.3 g/dL 8.2 8.2 -  Total Bilirubin 0.20 - 1.20 mg/dL 0.61 0.43 -  Alkaline Phos 40 - 150 U/L 89 94 -  AST 5 - 34 U/L 26 18 -  ALT 0 - 55 U/L 17 11 -   Pathology report Diagnosis 04/08/2015 1. Liver, needle/core biopsy, ? cancer - METASTATIC ADENOCARCINOMA. 2. Colon, segmental resection for tumor, sigmoid colon mass open end proximal - COLONIC ADENOCARCINOMA WITH MUCINOUS FEATURES EXTENDING INTO PERICOLONIC ADIPOSE TISSUE AND SUBSEROSAL CONNECTIVE TISSUE. - MARGINS NOT INVOLVED. - FIFTEEN BENIGN LYMPH NODES (0/15). 3. Colon, resection margin (donut), distal anastomic ring - BENIGN COLON. - NO EVIDENCE OF MALIGNANCY.  Microscopic Comment Specimen: Sigmoid colon with liver biopsy and anastomotic rings. Procedure: Segmental resection with liver biopsy. Tumor  site: Distal sigmoid. Specimen integrity: Intact. Macroscopic intactness of mesorectum: N/A Macroscopic tumor perforation: No. Invasive tumor: Maximum size: 8 cm Histologic type(s): Colorectal adenocarcinoma with mucinous features. Histologic grade and differentiation: G2: moderately differentiated/low grade Type of polyp in which invasive carcinoma arose: No residual polyp. Microscopic extension of invasive tumor: Into pericolonic adipose tissue and subserosal connective tissue. Lymph-Vascular invasion: Present. Peri-neural invasion: Present. Tumor deposit(s) (discontinuous extramural extension): No. Resection margins: Proximal margin: Free of tumor. Distal margin: Free of tumor. Circumferential (radial) (posterior ascending, posterior descending; lateral and posterior mid-rectum; and entire lower 1/3 rectum): N/A Mesenteric margin (sigmoid and transverse): Free of  tumor. Distance closest margin (if all above margins negative): 2 cm from mesenteric margin. Treatment effect (neo-adjuvant therapy): No. Additional polyp(s): No. Non-neoplastic findings: None. Lymph nodes: number examined - 15; number positive: 0 Pathologic Staging: pT3, pN0, pM1 Ancillary studies: MMR pending. (JDP:gt, 04/10/15) 2. Mismatch Repair (MMR) Protein Immunohistochemistry (IHC) IHC Expression Result: MLH1: Preserved nuclear expression (greater 50% tumor expression) MSH2: Preserved nuclear expression (greater 50% tumor expression) MSH6: Preserved nuclear expression (greater 50% tumor expression) PMS2: Preserved nuclear expression (greater 50% tumor expression) * Internal control demonstrates intact nuclear expression Interpretation: NORMAL   FoundationOne test result   RADIOGRAPHIC STUDIES: I have personally reviewed the radiological images as listed and agreed with the findings in the report.  Ct Chest abdomen and pelvis W Contrast 04/06/2015    IMPRESSION: No acute abnormality identified within the  chest. No evidence of metastatic disease.  Several enhancing masses in the liver consistent with liver metastasis.  Masses within the pelvis likely of the Uterine origin. Given the findings within the liver, malignant neoplasm is not excluded.  A conglomerate of matted bowel loops in the mid lower abdomen. There is no evidence of small bowel obstruction.  Electronically Signed: By: Abelardo Diesel M.D. On: 04/06/2015 11:49    Ct Abdomen Pelvis W Contrast  04/06/2015   ADDENDUM REPORT: 04/06/2015 13:18  ADDENDUM: Upon further evaluation, The masslike lesion in the pelvis previously question matted small bowel loops represent 9 cm sigmoid colon cancer.   Electronically Signed   By: Abelardo Diesel M.D.   On: 04/06/2015 13:18      ASSESSMENT & PLAN:  54 year old African-American female, with past medical history of hypertension, thalassemia trait, iron deficient anemia, uterine fibroids, endometriosis, who presents with intermittent rectal bleeding, mild nausea, and mild intermittent left lower quadrant abdominal pain.   1. Sigmoid rectal adenocarcinoma, with liver metastases, pT3N0M1, stage IV, MMR normal, NRAS and BRAF mutation (+)  -I reviewed her surgical pathology findings with her in great details. Her liver biopsy confirmed metastatic adenocarcinoma  From colon cancer. Her primary sigmoid rectal tumor has been completely removed. -We again reviewed her CT findings, it showed at least 4 liver metastatic lesion, with the largest one measuring 9.1 cm in the left upper lobe. No other distant metastasis -We reviewed the natural history of metastatic colon cancer, and incurable nature at this is stage, giving her large metastatic disease including the liver. We also discussed that if she had excellent response to chemotherapy, I'll present her restaging CT scan in our GI tumor Board, to see if she is a candidate for liver surgery or liver targeted therapy to maximally controlled her disease. -Given her  young age, good performance status, I recommend systemic chemotherapy with FOLFIRI and Avastin  --Chemotherapy consent: Side effects including but does not not limited to, fatigue, nausea, vomiting, diarrhea, hair loss, neuropathy, fluid retention, renal and kidney dysfunction, neutropenic fever, needed for blood transfusion, bleeding, hypertension, hemorrhage, bowel perforation, proteinuria, were discussed with patient in great detail. She agrees to proceed. -I discussed her Foundation one genomic test results with patient and her husband, a print out the results for her. Her tumor contains a rest and be ref mutation, she will not benefit from EGFR inhibitor. BRAF mutated colon cancer also has poor prognosis.  -Her request her tumor to be tested for Foundation one last week, the results are still pending -We'll start her first cycle FOLFIRI today, and add Avastin from next cycle.  -Lab reviewed, adequate for treatment, we'll proceed  to chemotherapy. -Her CEA level has came down after her surgery.  2. Iron deficient Anemia -Secondary to GI bleeding and iron deficiency -Ferritin level was 8, consistent with iron deficiency from GI bleeding -She received Feraheme 510 mg twice, will repeat her iron level next month  -Her anemia has significantly improved, hemoglobin 10.7 today   3. RUQ abdominal pain -much improved  -Likely related to her colon surgery and liver biopsy -She has oxycodone at home, but does not use much, she takes NSAIDs for the pain  4. Weight loss and malnutrition -I encouraged her to increase calorie and protein intake, and gain some weight back -Nutrition consult  Plan -first FOLFIRI today  -return to clinic in 2 weeks, with second cycle FOLFIRI and Avastin   All questions were answered. The patient knows to call the clinic with any problems, questions or concerns. I spent 25 minutes counseling the patient face to face. The total time spent in the appointment was 30  minutes and more than 50% was on counseling.     Truitt Merle, MD 05/08/2015 4:11 PM

## 2015-05-10 ENCOUNTER — Ambulatory Visit (HOSPITAL_BASED_OUTPATIENT_CLINIC_OR_DEPARTMENT_OTHER): Payer: BLUE CROSS/BLUE SHIELD

## 2015-05-10 VITALS — BP 140/80 | HR 72 | Temp 98.1°F | Resp 18

## 2015-05-10 DIAGNOSIS — C787 Secondary malignant neoplasm of liver and intrahepatic bile duct: Secondary | ICD-10-CM | POA: Diagnosis not present

## 2015-05-10 DIAGNOSIS — C189 Malignant neoplasm of colon, unspecified: Secondary | ICD-10-CM

## 2015-05-10 MED ORDER — SODIUM CHLORIDE 0.9 % IJ SOLN
10.0000 mL | INTRAMUSCULAR | Status: DC | PRN
  Administered 2015-05-10: 10 mL
  Filled 2015-05-10: qty 10

## 2015-05-10 MED ORDER — HEPARIN SOD (PORK) LOCK FLUSH 100 UNIT/ML IV SOLN
500.0000 [IU] | Freq: Once | INTRAVENOUS | Status: AC | PRN
  Administered 2015-05-10: 500 [IU]
  Filled 2015-05-10: qty 5

## 2015-05-10 NOTE — Patient Instructions (Signed)

## 2015-05-12 ENCOUNTER — Telehealth: Payer: Self-pay | Admitting: *Deleted

## 2015-05-12 NOTE — Telephone Encounter (Signed)
Called pt to discuss how she did with her Chemo 05/08/15.  She reported that she felt fatigued & had malaise-hurting head to toe & was in bed all day.  She felt like the zofran made her have anxiety so she reduced from 8 mg to 4 mg & thinks this is less anxiety producing.  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  She is eating taco salads which seems to be all she wants.  She tried ginger for nausea but didn't seem to help.  She has felt a little constipated & restarted her miralax yest.  She will discuss nausea, fatigue & malaise with Dr Burr Medico at next visit.  She feels OK with the zofran 4 mg rather than the 8 mg.   She knows she can call for any questions or concerns.

## 2015-05-12 NOTE — Telephone Encounter (Signed)
-----   Message from Cora Collum, RN sent at 05/08/2015  1:53 PM EDT ----- Regarding: Dr. Burr Medico foolow up chemo call 1st time  Camptosar, Leucovorin, Adrucil. Dr,.Burr Medico

## 2015-05-19 ENCOUNTER — Other Ambulatory Visit: Payer: BLUE CROSS/BLUE SHIELD

## 2015-05-19 ENCOUNTER — Encounter: Payer: BLUE CROSS/BLUE SHIELD | Admitting: Nutrition

## 2015-05-19 ENCOUNTER — Ambulatory Visit: Payer: BLUE CROSS/BLUE SHIELD | Admitting: Hematology

## 2015-05-19 ENCOUNTER — Ambulatory Visit: Payer: BLUE CROSS/BLUE SHIELD

## 2015-05-21 ENCOUNTER — Encounter (HOSPITAL_COMMUNITY): Payer: Self-pay | Admitting: Surgery

## 2015-05-22 ENCOUNTER — Telehealth: Payer: Self-pay | Admitting: Hematology

## 2015-05-22 ENCOUNTER — Encounter: Payer: Self-pay | Admitting: Hematology

## 2015-05-22 ENCOUNTER — Ambulatory Visit (HOSPITAL_BASED_OUTPATIENT_CLINIC_OR_DEPARTMENT_OTHER): Payer: BLUE CROSS/BLUE SHIELD

## 2015-05-22 ENCOUNTER — Ambulatory Visit: Payer: BLUE CROSS/BLUE SHIELD | Admitting: Nutrition

## 2015-05-22 ENCOUNTER — Ambulatory Visit (HOSPITAL_BASED_OUTPATIENT_CLINIC_OR_DEPARTMENT_OTHER): Payer: BLUE CROSS/BLUE SHIELD | Admitting: Hematology

## 2015-05-22 ENCOUNTER — Other Ambulatory Visit (HOSPITAL_BASED_OUTPATIENT_CLINIC_OR_DEPARTMENT_OTHER): Payer: BLUE CROSS/BLUE SHIELD

## 2015-05-22 VITALS — BP 132/74 | HR 90

## 2015-05-22 VITALS — BP 143/87 | HR 85 | Temp 98.3°F | Resp 20 | Ht 66.0 in | Wt 160.1 lb

## 2015-05-22 DIAGNOSIS — C189 Malignant neoplasm of colon, unspecified: Secondary | ICD-10-CM

## 2015-05-22 DIAGNOSIS — C19 Malignant neoplasm of rectosigmoid junction: Secondary | ICD-10-CM | POA: Diagnosis not present

## 2015-05-22 DIAGNOSIS — D500 Iron deficiency anemia secondary to blood loss (chronic): Secondary | ICD-10-CM

## 2015-05-22 DIAGNOSIS — C787 Secondary malignant neoplasm of liver and intrahepatic bile duct: Secondary | ICD-10-CM

## 2015-05-22 DIAGNOSIS — Z5111 Encounter for antineoplastic chemotherapy: Secondary | ICD-10-CM | POA: Diagnosis not present

## 2015-05-22 DIAGNOSIS — Z5112 Encounter for antineoplastic immunotherapy: Secondary | ICD-10-CM | POA: Diagnosis not present

## 2015-05-22 LAB — COMPREHENSIVE METABOLIC PANEL (CC13)
ALT: 18 U/L (ref 0–55)
AST: 19 U/L (ref 5–34)
Albumin: 4 g/dL (ref 3.5–5.0)
Alkaline Phosphatase: 100 U/L (ref 40–150)
Anion Gap: 10 mEq/L (ref 3–11)
BUN: 10.4 mg/dL (ref 7.0–26.0)
CHLORIDE: 105 meq/L (ref 98–109)
CO2: 26 meq/L (ref 22–29)
Calcium: 10.2 mg/dL (ref 8.4–10.4)
Creatinine: 1 mg/dL (ref 0.6–1.1)
EGFR: 75 mL/min/{1.73_m2} — AB (ref >90)
GLUCOSE: 123 mg/dL (ref 70–140)
POTASSIUM: 3.5 meq/L (ref 3.5–5.1)
SODIUM: 140 meq/L (ref 136–145)
TOTAL PROTEIN: 8.5 g/dL — AB (ref 6.4–8.3)
Total Bilirubin: 0.59 mg/dL (ref 0.20–1.20)

## 2015-05-22 LAB — CBC WITH DIFFERENTIAL/PLATELET
BASO%: 0.2 % (ref 0.0–2.0)
BASOS ABS: 0 10*3/uL (ref 0.0–0.1)
EOS%: 6.4 % (ref 0.0–7.0)
Eosinophils Absolute: 0.3 10*3/uL (ref 0.0–0.5)
HCT: 38.5 % (ref 34.8–46.6)
HGB: 11.5 g/dL — ABNORMAL LOW (ref 11.6–15.9)
LYMPH%: 21 % (ref 14.0–49.7)
MCH: 21 pg — ABNORMAL LOW (ref 25.1–34.0)
MCHC: 29.9 g/dL — AB (ref 31.5–36.0)
MCV: 70.4 fL — AB (ref 79.5–101.0)
MONO#: 0.3 10*3/uL (ref 0.1–0.9)
MONO%: 6.4 % (ref 0.0–14.0)
NEUT#: 3.1 10*3/uL (ref 1.5–6.5)
NEUT%: 66 % (ref 38.4–76.8)
NRBC: 0 % (ref 0–0)
Platelets: 487 10*3/uL — ABNORMAL HIGH (ref 145–400)
RBC: 5.47 10*6/uL — AB (ref 3.70–5.45)
RDW: 28.1 % — ABNORMAL HIGH (ref 11.2–14.5)
WBC: 4.7 10*3/uL (ref 3.9–10.3)
lymph#: 1 10*3/uL (ref 0.9–3.3)

## 2015-05-22 LAB — UA PROTEIN, DIPSTICK - CHCC: Protein, ur: 100 mg/dL

## 2015-05-22 LAB — TECHNOLOGIST REVIEW

## 2015-05-22 LAB — CEA: CEA: 582.5 ng/mL — ABNORMAL HIGH (ref 0.0–5.0)

## 2015-05-22 MED ORDER — SODIUM CHLORIDE 0.9 % IV SOLN
Freq: Once | INTRAVENOUS | Status: AC
  Administered 2015-05-22: 12:00:00 via INTRAVENOUS

## 2015-05-22 MED ORDER — SODIUM CHLORIDE 0.9 % IV SOLN
2400.0000 mg/m2 | INTRAVENOUS | Status: DC
  Administered 2015-05-22: 4400 mg via INTRAVENOUS
  Filled 2015-05-22: qty 88

## 2015-05-22 MED ORDER — FLUOROURACIL CHEMO INJECTION 2.5 GM/50ML
400.0000 mg/m2 | Freq: Once | INTRAVENOUS | Status: AC
  Administered 2015-05-22: 750 mg via INTRAVENOUS
  Filled 2015-05-22: qty 15

## 2015-05-22 MED ORDER — IRINOTECAN HCL CHEMO INJECTION 100 MG/5ML
180.0000 mg/m2 | Freq: Once | INTRAVENOUS | Status: AC
  Administered 2015-05-22: 330 mg via INTRAVENOUS
  Filled 2015-05-22: qty 16.5

## 2015-05-22 MED ORDER — SODIUM CHLORIDE 0.9 % IV SOLN
Freq: Once | INTRAVENOUS | Status: AC
  Administered 2015-05-22: 12:00:00 via INTRAVENOUS
  Filled 2015-05-22: qty 8

## 2015-05-22 MED ORDER — LEUCOVORIN CALCIUM INJECTION 350 MG
400.0000 mg/m2 | Freq: Once | INTRAMUSCULAR | Status: AC
  Administered 2015-05-22: 732 mg via INTRAVENOUS
  Filled 2015-05-22: qty 36.6

## 2015-05-22 MED ORDER — OXYCODONE HCL 5 MG PO TABS: 5.0000 mg | ORAL_TABLET | Freq: Four times a day (QID) | ORAL | Status: DC | PRN

## 2015-05-22 MED ORDER — ATROPINE SULFATE 1 MG/ML IJ SOLN
0.5000 mg | Freq: Once | INTRAMUSCULAR | Status: AC | PRN
Start: 2015-05-22 — End: 2015-05-22
  Administered 2015-05-22: 0.5 mg via INTRAVENOUS

## 2015-05-22 MED ORDER — SODIUM CHLORIDE 0.9 % IV SOLN
5.0000 mg/kg | Freq: Once | INTRAVENOUS | Status: AC
  Administered 2015-05-22: 350 mg via INTRAVENOUS
  Filled 2015-05-22: qty 14

## 2015-05-22 MED ORDER — ATROPINE SULFATE 1 MG/ML IJ SOLN
INTRAMUSCULAR | Status: AC
  Filled 2015-05-22: qty 1

## 2015-05-22 NOTE — Progress Notes (Signed)
54 year old female diagnosed with Metastatic Colon cancer.  She is a patient of Dr. Burr Medico.  PMH includes HTN, Endometriosis, Thalassemia trait  Medications include Zofran.  Labs include Albumin 3.9 and HGB 10.7 on Sept 29.  Height: 66 inches. Weight: 160.1 pounds. BMI: 25.85.  Patient reports lactose intolerance. Describes taste alterations (metallic taste). Reports better tolerance to cold foods after treatment. Consumes boost as an oral nutrition supplement. Has questions about "sugar and cancer".  Nutrition Diagnosis:  Food and Nutrition Related Knowledge Deficit related to metastatic colon cancer as evidenced by no prior need for nutrition related information.  Intervention:   Educated patient on strategies for consuming small frequent, high-calorie, high-protein foods for weight maintenance. Recommended patient continue boost twice a day between meals. Educated patient on strategies for improving altered taste and provided fact sheet. Educated patient on strategies for eating with nausea.  I provided fact sheet. Educated patient about sugar and cancer and provided a fact sheet on discussion. Coupons were provided.  Questions were answered.  Teach back method was used.  Monitoring, evaluation, goals: Patient will tolerate adequate calories and protein to promote weight maintenance and maintain lean body mass.  Next visit: Patient will contact me for questions or concerns.  **Disclaimer: This note was dictated with voice recognition software. Similar sounding words can inadvertently be transcribed and this note may contain transcription errors which may not have been corrected upon publication of note.**

## 2015-05-22 NOTE — Progress Notes (Signed)
Christina [name]  [phone]) [phone] Fax:(336) [phone]  Clinic follow up Note   Patient Care Team: Benito Mccreedy, MD as PCP - General (Internal Medicine) Juanita Craver, MD as Consulting Physician (Gastroenterology) Michael Boston, MD as Consulting Physician (General Surgery) Truitt Merle, MD as Consulting Physician (Oncology) 05/22/2015  CHIEF COMPLAINTS:  Follow up metastatic colon cancer      Oncology History   Metastatic colon cancer to liver   Staging form: Colon and Rectum, AJCC 7th Edition     Pathologic stage from 04/08/2015: T3, N0, M1 - Signed by Truitt Merle, MD on 04/22/2015  Presented to ER with intractable N/V; intermittent rectal bleeding X 3 months      Metastatic colon cancer to liver ([place])   04/03/2015 Procedure Colonoscopy showed a obstructing sigmoid rectal mass. Biopsy showed adenocarcinoma.   04/06/2015 Tumor Marker CEA=467.3 / CA19.9=1605   04/06/2015 Imaging CT chest, abdomen and pelvis with contrast showed sigmoid colon rectal mass, multiple (4) liver metastasis, with the largest 9.1 x 6.1 cm mass in the left lobe..   04/08/2015 Initial Diagnosis Metastatic colon cancer to liver   04/08/2015 Surgery Laparoscopic sigmoid colectomy, liver biopsy, port cath insertion, by Dr. Johney Maine    04/08/2015 Pathology Results Sigmoid colon segmental resection showed adenocarcinoma with mucinous features, pT 3, 15 lymph nodes all negative, surgical margins were negative. Liver biopsy showed metastatic adenocarcinoma.   05/08/2015 -  Chemotherapy FOLFIRI every 2 weeks, Avastin was added on from second cycle      HISTORY OF PRESENTING ILLNESS:  Christina [name] 54 y.o. female with past medical history of endometriosis, uterine fibroids, thalassemia trait, iron deficient anemia, who was recently found to have a sigmoid rectal cancer. I initially saw her in the hospital, she is here for the first follow-up.  She has been having intermittent rectal bleeding for the past 3  months. She thought it was related to her endometriosis, did not seek medical attention. She also has intermittent mild nausea, especially in the morning, and left lower quadrant abdominal pain. She was found to have profound anemia with hemoglobin 6.5 last week, and received blood transfusion. She was referred to gastroenterologist Dr. Collene Mares last week, underwent colonoscopy 2 days ago, which showed a obstructing sigmoid rectal mass, per patient, the colonoscopy report is not available today. Biopsy was done, but the pathology report is still pending.  She developed severe nausea, and vomited several times with clear gastric liquid. She called [name], and I was told to come to [facility] emergency room by on-call physician Dr. Carlean Purl. She had a CT scan done in the emergency room today.  Her appetite was fairly normal up to 2 days ago before recent hospital admission, when she was found to have a colorectal mass. She lost about 45 pounds in the past few weeks. 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 family history was positive for colon cancer in her maternal cousin. She is married, lives with her husband, no children.  INTERIM HISTORY [name] returns for follow-up and cycle 2 chemotherapy. She is accompanied to the clinic of by her husband. She tolerated the first cycle well, with moderate fatigue andlowappetitefor2-3daysafterchemotherapy but the recovered quickly. Her abdominal pain at the surgical site has improved, she takes Percocet occasionally. No other new complaints She is still working 3 half days a week.    MEDICAL HISTORY:  Past Medical History  Diagnosis Date  . Hypertension   . Endometriosis   .  Thalassanemia     SURGICAL HISTORY: Past Surgical History  Procedure Laterality Date  . Myomectomy      Gyn in [place]  . Diagnostic laparoscopy      Endometriosis  . Colon resection N/A 04/08/2015    Procedure:  LAPAROSCOPIC  RESECTION OF PART OF  SIGMOID COLON;  Surgeon: Michael Boston, MD;  Location: [facility];  Service: General;  Laterality: N/A;  . Liver biopsy N/A 04/08/2015    Procedure: CORE NEEDLE LIVER BIOPSY;  Surgeon: Michael Boston, MD;  Location: [facility];  Service: General;  Laterality: N/A;  . Portacath placement N/A 04/08/2015    Procedure: INSERTION PORT-A-CATH;  Surgeon: Michael Boston, MD;  Location: [facility];  Service: General;  Laterality: N/A;    SOCIAL HISTORY: Social History   Social History  . Marital Status: Married    Spouse Name: N/A  . Number of Children: N/A  . Years of Education: N/A   Occupational History  . Internal Medicine doctor     Works in [place] Topics  . Smoking status: Never Smoker   . Smokeless tobacco: Not on file  . Alcohol Use: Yes     Comment: socail drinker   . Drug Use: No  . Sexual Activity: Not on file   Other Topics Concern  . Not on file   Social History Narrative   Married, husband [name] (married X 2 years)   No children   IM Physician in [place]: Family History  Problem Relation Age of Onset  . Anesthesia problems Cousin 40    maternal cousin, colon cancer   . Clotting disorder Maternal Grandmother     ALLERGIES:  is allergic to lactose intolerance (gi); milk-related compounds; and nsaids.  MEDICATIONS:  Current Outpatient Prescriptions  Medication Sig Dispense Refill  . lidocaine-prilocaine (EMLA) cream Apply 1 application topically as needed. 30 g 6  . NIFEdipine (PROCARDIA-XL/ADALAT CC) 30 MG 24 hr tablet Take 30 mg by mouth daily.    . ondansetron (ZOFRAN) 4 MG tablet Take 1 tablet (4 mg total) by mouth every 6 (six) hours as needed for nausea. 20 tablet 0  . oxyCODONE (OXY IR/ROXICODONE) 5 MG immediate release tablet Take 1 tablet (5 mg total) by mouth every 6 (six) hours as needed for moderate pain. 20 tablet 0   No current facility-administered medications for this  visit.    REVIEW OF SYSTEMS:   Constitutional: Denies fevers, chills or abnormal night sweats, (+) hot flash and weight loss Eyes: Denies blurriness of vision, double vision or watery eyes Ears, nose, mouth, throat, and face: Denies mucositis or sore throat Respiratory: Denies cough, dyspnea or wheezes Cardiovascular: Denies palpitation, chest discomfort or lower extremity swelling Gastrointestinal:  Denies nausea, heartburn, (+) RUQ pain, and mild constipation Skin: Denies abnormal skin rashes Lymphatics: Denies new lymphadenopathy or easy bruising Neurological:Denies numbness, tingling or new weaknesses Behavioral/Psych: Mood is stable, no new changes  All other systems were reviewed with the patient and are negative.  PHYSICAL EXAMINATION: ECOG PERFORMANCE STATUS: 1 - Symptomatic but completely ambulatory  Filed Vitals:   05/22/15 1044  BP: 143/87  Pulse: 85  Temp: 98.3 F (36.8 C)  Resp: 20   Filed Weights   05/22/15 1044  Weight: 160 lb 1.6 oz (72.621 kg)    GENERAL:alert, no distress and comfortable SKIN: skin color, texture, turgor are normal, no rashes or significant lesions EYES: normal, conjunctiva are pink and non-injected, sclera clear OROPHARYNX:no  exudate, no erythema and lips, buccal mucosa, and tongue normal  NECK: supple, thyroid normal size, non-tender, without nodularity LYMPH:  no palpable lymphadenopathy in the cervical, axillary or inguinal LUNGS: clear to auscultation and percussion with normal breathing effort HEART: regular rate & rhythm and no murmurs and no lower extremity edema ABDOMEN:abdomen soft, non-tender and normal bowel sounds Musculoskeletal:no cyanosis of digits and no clubbing  PSYCH: alert & oriented x 3 with fluent speech NEURO: no focal motor/sensory deficits  LABORATORY DATA:  I have reviewed the data as listed CBC Latest Ref Rng 05/22/2015 05/08/2015 04/22/2015  WBC 3.9 - 10.3 10e3/uL 4.7 7.4 8.6  Hemoglobin 11.6 - 15.9 g/dL  11.5(L) 10.7(L) 9.8(L)  Hematocrit 34.8 - 46.6 % 38.5 36.8 33.9(L)  Platelets 145 - 400 10e3/uL 487(H) 419(H) 816(H)    CMP Latest Ref Rng 05/22/2015 05/08/2015 04/22/2015  Glucose 70 - 140 mg/dl 123 97 91  BUN 7.0 - 26.0 mg/dL 10.4 14.4 10.7  Creatinine 0.6 - 1.1 mg/dL 1.0 1.0 1.1  Sodium 136 - 145 mEq/L 140 141 143  Potassium 3.5 - 5.1 mEq/L 3.5 4.1 3.9  Chloride 101 - 111 mmol/L - - -  CO2 22 - 29 mEq/L 26 29 29   Calcium 8.4 - 10.4 mg/dL 10.2 10.3 10.2  Total Protein 6.4 - 8.3 g/dL 8.5(H) 8.2 8.2  Total Bilirubin 0.20 - 1.20 mg/dL 0.59 0.61 0.43  Alkaline Phos 40 - 150 U/L 100 89 94  AST 5 - 34 U/L 19 26 18   ALT 0 - 55 U/L 18 17 11    Pathology report Diagnosis 04/08/2015 1. Liver, needle/core biopsy, ? cancer - METASTATIC ADENOCARCINOMA. 2. Colon, segmental resection for tumor, sigmoid colon mass open end proximal - COLONIC ADENOCARCINOMA WITH MUCINOUS FEATURES EXTENDING INTO PERICOLONIC ADIPOSE TISSUE AND SUBSEROSAL CONNECTIVE TISSUE. - MARGINS NOT INVOLVED. - FIFTEEN BENIGN LYMPH NODES (0/15). 3. Colon, resection margin (donut), distal anastomic ring - BENIGN COLON. - NO EVIDENCE OF MALIGNANCY.  Microscopic Comment Specimen: Sigmoid colon with liver biopsy and anastomotic rings. Procedure: Segmental resection with liver biopsy. Tumor site: Distal sigmoid. Specimen integrity: Intact. Macroscopic intactness of mesorectum: N/A Macroscopic tumor perforation: No. Invasive tumor: Maximum size: 8 cm Histologic type(s): Colorectal adenocarcinoma with mucinous features. Histologic grade and differentiation: G2: moderately differentiated/low grade Type of polyp in which invasive carcinoma arose: No residual polyp. Microscopic extension of invasive tumor: Into pericolonic adipose tissue and subserosal connective tissue. Lymph-Vascular invasion: Present. Peri-neural invasion: Present. Tumor deposit(s) (discontinuous extramural extension): No. Resection margins: Proximal  margin: Free of tumor. Distal margin: Free of tumor. Circumferential (radial) (posterior ascending, posterior descending; lateral and posterior mid-rectum; and entire lower 1/3 rectum): N/A Mesenteric margin (sigmoid and transverse): Free of tumor. Distance closest margin (if all above margins negative): 2 cm from mesenteric margin. Treatment effect (neo-adjuvant therapy): No. Additional polyp(s): No. Non-neoplastic findings: None. Lymph nodes: number examined - 15; number positive: 0 Pathologic Staging: pT3, pN0, pM1 Ancillary studies: MMR pending. (JDP:gt, 04/10/15) 2. Mismatch Repair (MMR) Protein Immunohistochemistry (IHC) IHC Expression Result: MLH1: Preserved nuclear expression (greater 50% tumor expression) MSH2: Preserved nuclear expression (greater 50% tumor expression) MSH6: Preserved nuclear expression (greater 50% tumor expression) PMS2: Preserved nuclear expression (greater 50% tumor expression) * Internal control demonstrates intact nuclear expression Interpretation: NORMAL   [employer] test result   RADIOGRAPHIC STUDIES: I have personally reviewed the radiological images as listed and agreed with the findings in the report.  Ct Chest abdomen and pelvis W Contrast 04/06/2015    IMPRESSION: No acute abnormality  identified within the chest. No evidence of metastatic disease.  Several enhancing masses in the liver consistent with liver metastasis.  Masses within the pelvis likely of the Uterine origin. Given the findings within the liver, malignant neoplasm is not excluded.  A conglomerate of matted bowel loops in the mid lower abdomen. There is no evidence of small bowel obstruction.  Electronically Signed: By: Abelardo Diesel M.D. On: 04/06/2015 11:49    Ct Abdomen Pelvis W Contrast  04/06/2015   ADDENDUM REPORT: 04/06/2015 13:18  ADDENDUM: Upon further evaluation, The masslike lesion in the pelvis previously question matted small bowel loops represent 9 cm sigmoid colon  cancer.   Electronically Signed   By: Abelardo Diesel M.D.   On: 04/06/2015 13:18      ASSESSMENT & PLAN:  54 year old African-American female, with past medical history of hypertension, thalassemia trait, iron deficient anemia, uterine fibroids, endometriosis, who presents with intermittent rectal bleeding, mild nausea, and mild intermittent left lower quadrant abdominal pain.   1. Sigmoid rectal adenocarcinoma, with liver metastases, pT3N0M1, stage IV, MMR normal, NRAS and BRAF mutation (+)  -I reviewed her surgical pathology findings with her in great details. Her liver biopsy confirmed metastatic adenocarcinoma  From colon cancer. Her primary sigmoid rectal tumor has been completely removed. -We again reviewed her CT findings, it showed at least 4 liver metastatic lesion, with the largest one measuring 9.1 cm in the left upper lobe. No other distant metastasis -We reviewed the natural history of metastatic colon cancer, and incurable nature at this is stage, giving her large metastatic disease including the liver. We also discussed that if she had excellent response to chemotherapy, I'll present her restaging CT scan in our GI tumor Board, to see if she is a candidate for liver surgery or liver targeted therapy to maximally controlled her disease. -She tolerating first-line chemotherapy FOLFIRI well, will add Avastin today  -Lab reviewed, adequate for treatment, we'll proceed to chemotherapy. -She has mild proteinuria, likely secondary to hypertension, watch it closely. -Her CEA level has came down after her surgery.  2. Iron deficient Anemia -Secondary to GI bleeding and iron deficiency -Ferritin level was 8, consistent with iron deficiency from GI bleeding -She received Feraheme 510 mg twice, will repeat her iron level next month  -Her anemia has significantly improved, hemoglobin 11.5 today   3. RUQ abdominal pain -much improved  -Likely related to her colon surgery and liver biopsy -I  refilled her oxycodone today   4. Weight loss and malnutrition -I encouraged her to increase calorie and protein intake, and gain some weight back -follow up with dietician   Plan -second cycle FOLFIRI today, add avastin  -return to clinic in 2 weeks, with third cycle FOLFIRI and Avastin  -refilled her percocet today   All questions were answered. The patient knows to call the clinic with any problems, questions or concerns. I spent 25 minutes counseling the patient face to face. The total time spent in the appointment was 30 minutes and more than 50% was on counseling.     Truitt Merle, MD 05/22/2015 10:10 PM

## 2015-05-22 NOTE — Progress Notes (Signed)
Pt saw Dr. Burr Medico today prior to chemo.  OK to treat with urine protein 100 as per md.

## 2015-05-22 NOTE — Telephone Encounter (Signed)
Gave adn printed appt sched and avs for pt for OCT °

## 2015-05-22 NOTE — Patient Instructions (Signed)
[facility] Discharge Instructions for Patients Receiving Chemotherapy  Today you received the following chemotherapy agents Avastin/5 FU/ Irinotecan/Leucovorin To help prevent nausea and vomiting after your treatment, we encourage you to take your nausea medication as prescribed.  If you develop nausea and vomiting that is not controlled by your nausea medication, call the clinic.   BELOW ARE SYMPTOMS THAT SHOULD BE REPORTED IMMEDIATELY:  *FEVER GREATER THAN 100.5 F  *CHILLS WITH OR WITHOUT FEVER  NAUSEA AND VOMITING THAT IS NOT CONTROLLED WITH YOUR NAUSEA MEDICATION  *UNUSUAL SHORTNESS OF BREATH  *UNUSUAL BRUISING OR BLEEDING  TENDERNESS IN MOUTH AND THROAT WITH OR WITHOUT PRESENCE OF ULCERS  *URINARY PROBLEMS  *BOWEL PROBLEMS  UNUSUAL RASH Items with * indicate a potential emergency and should be followed up as soon as possible.  Feel free to call the clinic you have any questions or concerns. The clinic phone number is [phone].  Please show the [facility] at check-in to the Emergency Department and triage nurse.    Bevacizumab injection (Avastin) What is this medicine? BEVACIZUMAB (be va SIZ yoo mab) is a monoclonal antibody. It is used to treat cervical cancer, colorectal cancer, glioblastoma multiforme, non-small cell lung cancer (NSCLC), ovarian cancer, and renal cell cancer. This medicine may be used for other purposes; ask your health care provider or pharmacist if you have questions. What should I tell my health care provider before I take this medicine? They need to know if you have any of these conditions: -blood clots -heart disease, including heart failure, heart attack, or chest pain (angina) -high blood pressure -infection (especially a virus infection such as chickenpox, cold sores, or herpes) -kidney disease -lung disease -prior chemotherapy with doxorubicin, daunorubicin, epirubicin, or other anthracycline type chemotherapy  agents -recent or ongoing radiation therapy -recent surgery -stroke -an unusual or allergic reaction to bevacizumab, hamster proteins, mouse proteins, other medicines, foods, dyes, or preservatives -pregnant or trying to get pregnant -breast-feeding How should I use this medicine? This medicine is for infusion into a vein. It is given by a health care professional in a hospital or clinic setting. Talk to your pediatrician regarding the use of this medicine in children. Special care may be needed. Overdosage: If you think you have taken too much of this medicine contact a poison control center or emergency room at once. NOTE: This medicine is only for you. Do not share this medicine with others. What if I miss a dose? It is important not to miss your dose. Call your doctor or health care professional if you are unable to keep an appointment. What may interact with this medicine? Interactions are not expected. This list may not describe all possible interactions. Give your health care provider a list of all the medicines, herbs, non-prescription drugs, or dietary supplements you use. Also tell them if you smoke, drink alcohol, or use illegal drugs. Some items may interact with your medicine. What should I watch for while using this medicine? Your condition will be monitored carefully while you are receiving this medicine. You will need important blood work and urine testing done while you are taking this medicine. During your treatment, let your health care professional know if you have any unusual symptoms, such as difficulty breathing. This medicine may rarely cause 'gastrointestinal perforation' (holes in the stomach, intestines or colon), a serious side effect requiring surgery to repair. This medicine should be started at least 28 days following major surgery and the site of the surgery should  be totally healed. Check with your doctor before scheduling dental work or surgery while you are  receiving this treatment. Talk to your doctor if you have recently had surgery or if you have a wound that has not healed. Do not become pregnant while taking this medicine or for 6 months after stopping it. Women should inform their doctor if they wish to become pregnant or think they might be pregnant. There is a potential for serious side effects to an unborn child. Talk to your health care professional or pharmacist for more information. Do not breast-feed an infant while taking this medicine. This medicine has caused ovarian failure in some women. This medicine may interfere with the ability to have a child. You should talk to your doctor or health care professional if you are concerned about your fertility. What side effects may I notice from receiving this medicine? Side effects that you should report to your doctor or health care professional as soon as possible: -allergic reactions like skin rash, itching or hives, swelling of the face, lips, or tongue -signs of infection - fever or chills, cough, sore throat, pain or trouble passing urine -signs of decreased platelets or bleeding - bruising, pinpoint red spots on the skin, black, tarry stools, nosebleeds, blood in the urine -breathing problems -changes in vision -chest pain -confusion -jaw pain, especially after dental work -mouth sores -seizures -severe abdominal pain -severe headache -sudden numbness or weakness of the face, arm or leg -swelling of legs or ankles -symptoms of a stroke: change in mental awareness, inability to talk or move one side of the body (especially in patients with lung cancer) -trouble passing urine or change in the amount of urine -trouble speaking or understanding -trouble walking, dizziness, loss of balance or coordination Side effects that usually do not require medical attention (report to your doctor or health care professional if they continue or are bothersome): -constipation -diarrhea -dry  skin -headache -loss of appetite -nausea, vomiting This list may not describe all possible side effects. Call your doctor for medical advice about side effects. You may report side effects to FDA at 1-800-FDA-1088. Where should I keep my medicine? This drug is given in a hospital or clinic and will not be stored at home. NOTE: This sheet is a summary. It may not cover all possible information. If you have questions about this medicine, talk to your doctor, pharmacist, or health care provider.    2016, Elsevier/Gold Standard. (2014-09-24 16:58:44)

## 2015-05-23 ENCOUNTER — Encounter: Payer: Self-pay | Admitting: Surgery

## 2015-05-24 ENCOUNTER — Ambulatory Visit (HOSPITAL_BASED_OUTPATIENT_CLINIC_OR_DEPARTMENT_OTHER): Payer: BLUE CROSS/BLUE SHIELD

## 2015-05-24 VITALS — BP 129/87 | HR 77 | Temp 98.6°F | Resp 18

## 2015-05-24 DIAGNOSIS — C787 Secondary malignant neoplasm of liver and intrahepatic bile duct: Secondary | ICD-10-CM | POA: Diagnosis not present

## 2015-05-24 DIAGNOSIS — C189 Malignant neoplasm of colon, unspecified: Secondary | ICD-10-CM | POA: Diagnosis not present

## 2015-05-24 MED ORDER — SODIUM CHLORIDE 0.9 % IJ SOLN
10.0000 mL | INTRAMUSCULAR | Status: DC | PRN
  Administered 2015-05-24: 10 mL
  Filled 2015-05-24: qty 10

## 2015-05-24 MED ORDER — HEPARIN SOD (PORK) LOCK FLUSH 100 UNIT/ML IV SOLN
500.0000 [IU] | Freq: Once | INTRAVENOUS | Status: AC | PRN
  Administered 2015-05-24: 500 [IU]
  Filled 2015-05-24: qty 5

## 2015-06-05 ENCOUNTER — Other Ambulatory Visit (HOSPITAL_BASED_OUTPATIENT_CLINIC_OR_DEPARTMENT_OTHER): Payer: BLUE CROSS/BLUE SHIELD

## 2015-06-05 ENCOUNTER — Ambulatory Visit (HOSPITAL_BASED_OUTPATIENT_CLINIC_OR_DEPARTMENT_OTHER): Payer: BLUE CROSS/BLUE SHIELD

## 2015-06-05 ENCOUNTER — Other Ambulatory Visit: Payer: Self-pay | Admitting: Neurology

## 2015-06-05 ENCOUNTER — Ambulatory Visit (HOSPITAL_BASED_OUTPATIENT_CLINIC_OR_DEPARTMENT_OTHER): Payer: BLUE CROSS/BLUE SHIELD | Admitting: Hematology

## 2015-06-05 ENCOUNTER — Telehealth: Payer: Self-pay | Admitting: Hematology

## 2015-06-05 ENCOUNTER — Encounter: Payer: Self-pay | Admitting: Hematology

## 2015-06-05 VITALS — BP 143/80 | HR 70

## 2015-06-05 VITALS — BP 134/77 | HR 75 | Temp 98.3°F | Resp 18 | Ht 66.0 in | Wt 159.1 lb

## 2015-06-05 DIAGNOSIS — C189 Malignant neoplasm of colon, unspecified: Secondary | ICD-10-CM

## 2015-06-05 DIAGNOSIS — Z5112 Encounter for antineoplastic immunotherapy: Secondary | ICD-10-CM

## 2015-06-05 DIAGNOSIS — C787 Secondary malignant neoplasm of liver and intrahepatic bile duct: Secondary | ICD-10-CM | POA: Diagnosis not present

## 2015-06-05 DIAGNOSIS — C19 Malignant neoplasm of rectosigmoid junction: Secondary | ICD-10-CM

## 2015-06-05 DIAGNOSIS — Z5111 Encounter for antineoplastic chemotherapy: Secondary | ICD-10-CM

## 2015-06-05 DIAGNOSIS — D500 Iron deficiency anemia secondary to blood loss (chronic): Secondary | ICD-10-CM

## 2015-06-05 LAB — CBC WITH DIFFERENTIAL/PLATELET
BASO%: 0.2 % (ref 0.0–2.0)
BASOS ABS: 0 10*3/uL (ref 0.0–0.1)
EOS ABS: 0.1 10*3/uL (ref 0.0–0.5)
EOS%: 2.1 % (ref 0.0–7.0)
HCT: 38 % (ref 34.8–46.6)
HEMOGLOBIN: 11.4 g/dL — AB (ref 11.6–15.9)
LYMPH%: 22.8 % (ref 14.0–49.7)
MCH: 21.8 pg — AB (ref 25.1–34.0)
MCHC: 30 g/dL — ABNORMAL LOW (ref 31.5–36.0)
MCV: 72.8 fL — AB (ref 79.5–101.0)
MONO#: 0.2 10*3/uL (ref 0.1–0.9)
MONO%: 5.1 % (ref 0.0–14.0)
NEUT#: 3 10*3/uL (ref 1.5–6.5)
NEUT%: 69.8 % (ref 38.4–76.8)
NRBC: 0 % (ref 0–0)
PLATELETS: 404 10*3/uL — AB (ref 145–400)
RBC: 5.22 10*6/uL (ref 3.70–5.45)
RDW: 23.9 % — AB (ref 11.2–14.5)
WBC: 4.4 10*3/uL (ref 3.9–10.3)
lymph#: 1 10*3/uL (ref 0.9–3.3)

## 2015-06-05 LAB — COMPREHENSIVE METABOLIC PANEL (CC13)
ALK PHOS: 98 U/L (ref 40–150)
ALT: 10 U/L (ref 0–55)
ANION GAP: 9 meq/L (ref 3–11)
AST: 13 U/L (ref 5–34)
Albumin: 3.7 g/dL (ref 3.5–5.0)
BILIRUBIN TOTAL: 0.57 mg/dL (ref 0.20–1.20)
BUN: 11.2 mg/dL (ref 7.0–26.0)
CO2: 27 mEq/L (ref 22–29)
Calcium: 10.1 mg/dL (ref 8.4–10.4)
Chloride: 104 mEq/L (ref 98–109)
Creatinine: 1 mg/dL (ref 0.6–1.1)
EGFR: 74 mL/min/{1.73_m2} — AB (ref >90)
Glucose: 140 mg/dl (ref 70–140)
Potassium: 3.7 mEq/L (ref 3.5–5.1)
Sodium: 141 mEq/L (ref 136–145)
TOTAL PROTEIN: 8.1 g/dL (ref 6.4–8.3)

## 2015-06-05 LAB — UA PROTEIN, DIPSTICK - CHCC: Protein, ur: 100 mg/dL

## 2015-06-05 MED ORDER — LEUCOVORIN CALCIUM INJECTION 350 MG
400.0000 mg/m2 | Freq: Once | INTRAVENOUS | Status: AC
  Administered 2015-06-05: 732 mg via INTRAVENOUS
  Filled 2015-06-05: qty 36.6

## 2015-06-05 MED ORDER — SODIUM CHLORIDE 0.9 % IV SOLN
Freq: Once | INTRAVENOUS | Status: AC
  Administered 2015-06-05: 12:00:00 via INTRAVENOUS
  Filled 2015-06-05: qty 8

## 2015-06-05 MED ORDER — ATROPINE SULFATE 1 MG/ML IJ SOLN
INTRAMUSCULAR | Status: AC
  Filled 2015-06-05: qty 1

## 2015-06-05 MED ORDER — ATROPINE SULFATE 1 MG/ML IJ SOLN
0.5000 mg | Freq: Once | INTRAMUSCULAR | Status: AC | PRN
  Administered 2015-06-05: 0.5 mg via INTRAVENOUS

## 2015-06-05 MED ORDER — FLUOROURACIL CHEMO INJECTION 2.5 GM/50ML
400.0000 mg/m2 | Freq: Once | INTRAVENOUS | Status: AC
  Administered 2015-06-05: 750 mg via INTRAVENOUS
  Filled 2015-06-05: qty 15

## 2015-06-05 MED ORDER — IRINOTECAN HCL CHEMO INJECTION 100 MG/5ML
180.0000 mg/m2 | Freq: Once | INTRAVENOUS | Status: AC
  Administered 2015-06-05: 330 mg via INTRAVENOUS
  Filled 2015-06-05: qty 16.5

## 2015-06-05 MED ORDER — SODIUM CHLORIDE 0.9 % IV SOLN
Freq: Once | INTRAVENOUS | Status: AC
  Administered 2015-06-05: 11:00:00 via INTRAVENOUS

## 2015-06-05 MED ORDER — SODIUM CHLORIDE 0.9 % IV SOLN
5.0000 mg/kg | Freq: Once | INTRAVENOUS | Status: AC
  Administered 2015-06-05: 350 mg via INTRAVENOUS
  Filled 2015-06-05: qty 14

## 2015-06-05 MED ORDER — SODIUM CHLORIDE 0.9 % IV SOLN
2400.0000 mg/m2 | INTRAVENOUS | Status: DC
  Administered 2015-06-05: 4400 mg via INTRAVENOUS
  Filled 2015-06-05: qty 88

## 2015-06-05 NOTE — Progress Notes (Signed)
Okay to treat per Dr. Burr Medico despite urine protein of 100.

## 2015-06-05 NOTE — Progress Notes (Signed)
Christina [name]  [phone]) [phone] Fax:(336) [phone]  Clinic follow up Note   Patient Care Team: Benito Mccreedy, MD as PCP - General (Internal Medicine) Juanita Craver, MD as Consulting Physician (Gastroenterology) Michael Boston, MD as Consulting Physician (General Surgery) Truitt Merle, MD as Consulting Physician (Oncology) 06/05/2015  CHIEF COMPLAINTS:  Follow up metastatic colon cancer      Oncology History   Metastatic colon cancer to liver   Staging form: Colon and Rectum, AJCC 7th Edition     Pathologic stage from 04/08/2015: T3, N0, M1 - Signed by Truitt Merle, MD on 04/22/2015  Presented to ER with intractable N/V; intermittent rectal bleeding X 3 months      Metastatic colon cancer to liver ([place])   04/03/2015 Procedure Colonoscopy showed a obstructing sigmoid rectal mass. Biopsy showed adenocarcinoma.   04/06/2015 Tumor Marker CEA=467.3 / CA19.9=1605   04/06/2015 Imaging CT chest, abdomen and pelvis with contrast showed sigmoid colon rectal mass, multiple (4) liver metastasis, with the largest 9.1 x 6.1 cm mass in the left lobe..   04/08/2015 Initial Diagnosis Metastatic colon cancer to liver   04/08/2015 Surgery Laparoscopic sigmoid colectomy, liver biopsy, port cath insertion, by Dr. Johney Maine    04/08/2015 Pathology Results Sigmoid colon segmental resection showed adenocarcinoma with mucinous features, pT 3, 15 lymph nodes all negative, surgical margins were negative. Liver biopsy showed metastatic adenocarcinoma.   05/08/2015 -  Chemotherapy FOLFIRI every 2 weeks, Avastin was added on from second cycle      HISTORY OF PRESENTING ILLNESS:  Christina [name] 54 y.o. female with past medical history of endometriosis, uterine fibroids, thalassemia trait, iron deficient anemia, who was recently found to have a sigmoid rectal cancer. I initially saw her in the hospital, she is here for the first follow-up.  She has been having intermittent rectal bleeding for the past 3  months. She thought it was related to her endometriosis, did not seek medical attention. She also has intermittent mild nausea, especially in the morning, and left lower quadrant abdominal pain. She was found to have profound anemia with hemoglobin 6.5 last week, and received blood transfusion. She was referred to gastroenterologist Dr. Collene Mares last week, underwent colonoscopy 2 days ago, which showed a obstructing sigmoid rectal mass, per patient, the colonoscopy report is not available today. Biopsy was done, but the pathology report is still pending.  She developed severe nausea, and vomited several times with clear gastric liquid. She called [name], and I was told to come to [facility] emergency room by on-call physician Dr. Carlean Purl. She had a CT scan done in the emergency room today.  Her appetite was fairly normal up to 2 days ago before recent hospital admission, when she was found to have a colorectal mass. She lost about 45 pounds in the past few weeks. 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 family history was positive for colon cancer in her maternal cousin. She is married, lives with her husband, no children.  INTERIM HISTORY [name] returns for follow-up and cycle 3 chemotherapy. She has been tolerating chemotherapy well, she only had 1 episode of nausea after the last cycle chemotherapy, no diarrhea, her appetite has been fairly good, and she has been working part-time, with her husband drives her back and forth from work. She occasionally still has mild right upper quadrant abdominal pain if exerts herself, and occasionally takes the pain medication. She otherwise feels well, weight is stable.  MEDICAL  HISTORY:  Past Medical History  Diagnosis Date  . Hypertension   . Endometriosis   . Thalassanemia     SURGICAL HISTORY: Past Surgical History  Procedure Laterality Date  . Myomectomy      Gyn in [place]  .  Diagnostic laparoscopy      Endometriosis  . Colon resection N/A 04/08/2015    Procedure: LAPAROSCOPIC  RESECTION OF PART OF  SIGMOID COLON;  Surgeon: Michael Boston, MD;  Location: [facility];  Service: General;  Laterality: N/A;  . Liver biopsy N/A 04/08/2015    Procedure: CORE NEEDLE LIVER BIOPSY;  Surgeon: Michael Boston, MD;  Location: [facility];  Service: General;  Laterality: N/A;  . Portacath placement N/A 04/08/2015    Procedure: INSERTION PORT-A-CATH;  Surgeon: Michael Boston, MD;  Location: [facility];  Service: General;  Laterality: N/A;  . Laparoscopic sigmoid colectomy  04/08/2015    for colorectal cancer    SOCIAL HISTORY: Social History   Social History  . Marital Status: Married    Spouse Name: N/A  . Number of Children: N/A  . Years of Education: N/A   Occupational History  . Internal Medicine doctor     Works in [place] [employer]  . Smoking status: Never Smoker   . Smokeless tobacco: Not on file  . Alcohol Use: Yes     Comment: socail drinker   . Drug Use: No  . Sexual Activity: Not on file   Other Topics Concern  . Not on file   Social History Narrative   Married, husband [name] (married X 2 years)   No children   IM Physician in [place]: Family History  Problem Relation Age of Onset  . Anesthesia problems Cousin 26    maternal cousin, colon cancer   . Clotting disorder Maternal Grandmother     ALLERGIES:  is allergic to lactose intolerance (gi); milk-related compounds; and nsaids.  MEDICATIONS:  Current Outpatient Prescriptions  Medication Sig Dispense Refill  . lidocaine-prilocaine (EMLA) cream Apply 1 application topically as needed. 30 g 6  . NIFEdipine (PROCARDIA-XL/ADALAT CC) 30 MG 24 hr tablet Take 30 mg by mouth daily.    . ondansetron (ZOFRAN) 4 MG tablet Take 1 tablet (4 mg total) by mouth every 6 (six) hours as needed for nausea. 20 tablet 0  . oxyCODONE (OXY IR/ROXICODONE) 5 MG immediate release  tablet Take 1 tablet (5 mg total) by mouth every 6 (six) hours as needed for moderate pain. 20 tablet 0   No current facility-administered medications for this visit.    REVIEW OF SYSTEMS:   Constitutional: Denies fevers, chills or abnormal night sweats Eyes: Denies blurriness of vision, double vision or watery eyes Ears, nose, mouth, throat, and face: Denies mucositis or sore throat Respiratory: Denies cough, dyspnea or wheezes Cardiovascular: Denies palpitation, chest discomfort or lower extremity swelling Gastrointestinal:  Denies nausea, heartburn, (+) RUQ pain, and mild constipation Skin: Denies abnormal skin rashes Lymphatics: Denies new lymphadenopathy or easy bruising Neurological:Denies numbness, tingling or new weaknesses Behavioral/Psych: Mood is stable, no new changes  All other systems were reviewed with the patient and are negative.  PHYSICAL EXAMINATION: ECOG PERFORMANCE STATUS: 1 - Symptomatic but completely ambulatory  Filed Vitals:   06/05/15 1038  BP: 134/77  Pulse: 75  Temp: 98.3 F (36.8 C)  Resp: 18   Filed Weights   06/05/15 1038  Weight: 159 lb 1.6 oz (72.167 kg)    GENERAL:alert, no  distress and comfortable SKIN: skin color, texture, turgor are normal, no rashes or significant lesions EYES: normal, conjunctiva are pink and non-injected, sclera clear OROPHARYNX:no exudate, no erythema and lips, buccal mucosa, and tongue normal  NECK: supple, thyroid normal size, non-tender, without nodularity LYMPH:  no palpable lymphadenopathy in the cervical, axillary or inguinal LUNGS: clear to auscultation and percussion with normal breathing effort HEART: regular rate & rhythm and no murmurs and no lower extremity edema ABDOMEN:abdomen soft, non-tender and normal bowel sounds Musculoskeletal:no cyanosis of digits and no clubbing  PSYCH: alert & oriented x 3 with fluent speech NEURO: no focal motor/sensory deficits  LABORATORY DATA:  I have reviewed the data  as listed CBC Latest Ref Rng 06/05/2015 05/22/2015 05/08/2015  WBC 3.9 - 10.3 10e3/uL 4.4 4.7 7.4  Hemoglobin 11.6 - 15.9 g/dL 11.4(L) 11.5(L) 10.7(L)  Hematocrit 34.8 - 46.6 % 38.0 38.5 36.8  Platelets 145 - 400 10e3/uL 404(H) 487(H) 419(H)    CMP Latest Ref Rng 06/05/2015 05/22/2015 05/08/2015  Glucose 70 - 140 mg/dl 140 123 97  BUN 7.0 - 26.0 mg/dL 11.2 10.4 14.4  Creatinine 0.6 - 1.1 mg/dL 1.0 1.0 1.0  Sodium 136 - 145 mEq/L 141 140 141  Potassium 3.5 - 5.1 mEq/L 3.7 3.5 4.1  Chloride 101 - 111 mmol/L - - -  CO2 22 - 29 mEq/L 27 26 29   Calcium 8.4 - 10.4 mg/dL 10.1 10.2 10.3  Total Protein 6.4 - 8.3 g/dL 8.1 8.5(H) 8.2  Total Bilirubin 0.20 - 1.20 mg/dL 0.57 0.59 0.61  Alkaline Phos 40 - 150 U/L 98 100 89  AST 5 - 34 U/L 13 19 26   ALT 0 - 55 U/L 10 18 17    CEA  Status: Finalresult Visible to patient:  MyChart Nextappt: 06/07/2015 at 12:00 PM in Oncology ([facility]) Dx:  Metastatic colon cancer to liver ([place])              Ref Range 2wk ago  75moago  266mogo     CEA 0.0 - 5.0 ng/mL 582.5 (H) 299.4 (H)CM 467.3 (H)R, CM         Pathology report Diagnosis 04/08/2015 1. Liver, needle/core biopsy, ? cancer - METASTATIC ADENOCARCINOMA. 2. Colon, segmental resection for tumor, sigmoid colon mass open end proximal - COLONIC ADENOCARCINOMA WITH MUCINOUS FEATURES EXTENDING INTO PERICOLONIC ADIPOSE TISSUE AND SUBSEROSAL CONNECTIVE TISSUE. - MARGINS NOT INVOLVED. - FIFTEEN BENIGN LYMPH NODES (0/15). 3. Colon, resection margin (donut), distal anastomic ring - BENIGN COLON. - NO EVIDENCE OF MALIGNANCY.  Microscopic Comment Specimen: Sigmoid colon with liver biopsy and anastomotic rings. Procedure: Segmental resection with liver biopsy. Tumor site: Distal sigmoid. Specimen integrity: Intact. Macroscopic intactness of mesorectum: N/A Macroscopic tumor perforation: No. Invasive tumor: Maximum size: 8 cm Histologic type(s): Colorectal  adenocarcinoma with mucinous features. Histologic grade and differentiation: G2: moderately differentiated/low grade Type of polyp in which invasive carcinoma arose: No residual polyp. Microscopic extension of invasive tumor: Into pericolonic adipose tissue and subserosal connective tissue. Lymph-Vascular invasion: Present. Peri-neural invasion: Present. Tumor deposit(s) (discontinuous extramural extension): No. Resection margins: Proximal margin: Free of tumor. Distal margin: Free of tumor. Circumferential (radial) (posterior ascending, posterior descending; lateral and posterior mid-rectum; and entire lower 1/3 rectum): N/A Mesenteric margin (sigmoid and transverse): Free of tumor. Distance closest margin (if all above margins negative): 2 cm from mesenteric margin. Treatment effect (neo-adjuvant therapy): No. Additional polyp(s): No. Non-neoplastic findings: None. Lymph nodes: number examined - 15; number positive: 0 Pathologic Staging: pT3, pN0, pM1  Ancillary studies: MMR pending. (JDP:gt, 04/10/15) 2. Mismatch Repair (MMR) Protein Immunohistochemistry (IHC) IHC Expression Result: MLH1: Preserved nuclear expression (greater 50% tumor expression) MSH2: Preserved nuclear expression (greater 50% tumor expression) MSH6: Preserved nuclear expression (greater 50% tumor expression) PMS2: Preserved nuclear expression (greater 50% tumor expression) * Internal control demonstrates intact nuclear expression Interpretation: NORMAL   [employer] test result   RADIOGRAPHIC STUDIES: I have personally reviewed the radiological images as listed and agreed with the findings in the report.  Ct Chest abdomen and pelvis W Contrast 04/06/2015    IMPRESSION: No acute abnormality identified within the chest. No evidence of metastatic disease.  Several enhancing masses in the liver consistent with liver metastasis.  Masses within the pelvis likely of the Uterine origin. Given the findings within the  liver, malignant neoplasm is not excluded.  A conglomerate of matted bowel loops in the mid lower abdomen. There is no evidence of small bowel obstruction.  Electronically Signed: By: Abelardo Diesel M.D. On: 04/06/2015 11:49    Ct Abdomen Pelvis W Contrast  04/06/2015   ADDENDUM REPORT: 04/06/2015 13:18  ADDENDUM: Upon further evaluation, The masslike lesion in the pelvis previously question matted small bowel loops represent 9 cm sigmoid colon cancer.   Electronically Signed   By: Abelardo Diesel M.D.   On: 04/06/2015 13:18      ASSESSMENT & PLAN:  54 year old African-American female, with past medical history of hypertension, thalassemia trait, iron deficient anemia, uterine fibroids, endometriosis, who presents with intermittent rectal bleeding, mild nausea, and mild intermittent left lower quadrant abdominal pain.   1. Sigmoid rectal adenocarcinoma, with liver metastases, pT3N0M1, stage IV, MMR normal, NRAS and BRAF mutation (+)  -I reviewed her surgical pathology findings with her in great details. Her liver biopsy confirmed metastatic adenocarcinoma  From colon cancer. Her primary sigmoid rectal tumor has been completely removed. -We again reviewed her CT findings, it showed at least 4 liver metastatic lesion, with the largest one measuring 9.1 cm in the left upper lobe. No other distant metastasis -We reviewed the natural history of metastatic colon cancer, and incurable nature at this is stage, giving her large metastatic disease including the liver. We also discussed that if she had excellent response to chemotherapy, I'll present her restaging CT scan in our GI tumor Board, to see if she is a candidate for liver surgery or liver targeted therapy to maximally controlled her disease. -She tolerating first-line chemotherapy FOLFIRI well, Avastin added from cycle 2  -Lab reviewed, adequate for treatment, we'll proceed cycle 3 chemotherapy. -She has mild proteinuria, likely secondary to  hypertension, watch it closely. -Her CEA level came down after her surgery, but still remains high. -will repeat CT scans after 4-5 cycle chemo   2. Iron deficient Anemia -Secondary to GI bleeding and iron deficiency -Ferritin level was 8, consistent with iron deficiency from GI bleeding -She received Feraheme 510 mg twice, will repeat her iron level next month  -Her anemia has significantly improved, hemoglobin 11.4 today   3. RUQ abdominal pain -much improved  -Likely related to her colon surgery and liver biopsy -She only takes oxycodone occasionally  4. Weight loss and malnutrition -I encouraged her to increase calorie and protein intake, and gain some weight back -follow up with dietician  -He'll weight has been stable lately.  Plan -third cycle FOLFIRI and avastin  -return to clinic in 2 weeks for cycle 4    All questions were answered. The patient knows to call the clinic with  any problems, questions or concerns. I spent 15 minutes counseling the patient face to face. The total time spent in the appointment was 20 minutes and more than 50% was on counseling.     Truitt Merle, MD 06/05/2015 11:09 AM

## 2015-06-05 NOTE — Telephone Encounter (Signed)
per of to sch pt appt-sent MW email to sh pt trmt-willc all pt after reply

## 2015-06-05 NOTE — Patient Instructions (Signed)
[place] Discharge Instructions for Patients Receiving Chemotherapy  Today you received the following chemotherapy agents avastin, irinotecan, leucovorin, fluorouracil  To help prevent nausea and vomiting after your treatment, we encourage you to take your nausea medication    If you develop nausea and vomiting that is not controlled by your nausea medication, call the clinic.   BELOW ARE SYMPTOMS THAT SHOULD BE REPORTED IMMEDIATELY:  *FEVER GREATER THAN 100.5 F  *CHILLS WITH OR WITHOUT FEVER  NAUSEA AND VOMITING THAT IS NOT CONTROLLED WITH YOUR NAUSEA MEDICATION  *UNUSUAL SHORTNESS OF BREATH  *UNUSUAL BRUISING OR BLEEDING  TENDERNESS IN MOUTH AND THROAT WITH OR WITHOUT PRESENCE OF ULCERS  *URINARY PROBLEMS  *BOWEL PROBLEMS  UNUSUAL RASH Items with * indicate a potential emergency and should be followed up as soon as possible.  Feel free to call the clinic you have any questions or concerns. The clinic phone number is [phone].  Please show the [place] at check-in to the Emergency Department and triage nurse.

## 2015-06-06 ENCOUNTER — Other Ambulatory Visit: Payer: Self-pay | Admitting: *Deleted

## 2015-06-06 ENCOUNTER — Telehealth: Payer: Self-pay | Admitting: *Deleted

## 2015-06-06 NOTE — Telephone Encounter (Signed)
Per staff message and POF I have scheduled appts. Advised scheduler of appts. JMW  

## 2015-06-06 NOTE — Telephone Encounter (Signed)
Was called with appointment for 11/9 for lab/OV with Dr. Burr Medico. She is not able to come on Wednesday's due to being on call in [place]. Asking if OK to skip MD visit that cycle and see MD the following cycle since she is doing well? Asking to move 11/9 labs to chemo appointment on 11/10 (has not been scheduled yet). Forwarded message to MD.

## 2015-06-07 ENCOUNTER — Ambulatory Visit (HOSPITAL_BASED_OUTPATIENT_CLINIC_OR_DEPARTMENT_OTHER): Payer: BLUE CROSS/BLUE SHIELD

## 2015-06-07 VITALS — BP 149/90 | HR 79 | Temp 98.3°F | Resp 16

## 2015-06-07 DIAGNOSIS — Z95828 Presence of other vascular implants and grafts: Secondary | ICD-10-CM

## 2015-06-07 DIAGNOSIS — C19 Malignant neoplasm of rectosigmoid junction: Secondary | ICD-10-CM | POA: Diagnosis not present

## 2015-06-07 MED ORDER — HEPARIN SOD (PORK) LOCK FLUSH 100 UNIT/ML IV SOLN
500.0000 [IU] | Freq: Once | INTRAVENOUS | Status: AC
  Administered 2015-06-07: 500 [IU] via INTRAVENOUS
  Filled 2015-06-07: qty 5

## 2015-06-07 MED ORDER — SODIUM CHLORIDE 0.9 % IJ SOLN
10.0000 mL | INTRAMUSCULAR | Status: DC | PRN
  Administered 2015-06-07: 10 mL via INTRAVENOUS
  Filled 2015-06-07: qty 10

## 2015-06-09 ENCOUNTER — Telehealth: Payer: Self-pay | Admitting: Hematology

## 2015-06-09 NOTE — Telephone Encounter (Signed)
Patient aware of her new 11/10 appointments

## 2015-06-11 ENCOUNTER — Other Ambulatory Visit: Payer: Self-pay | Admitting: *Deleted

## 2015-06-11 ENCOUNTER — Telehealth: Payer: Self-pay | Admitting: *Deleted

## 2015-06-11 MED ORDER — OXYCODONE HCL 5 MG PO TABS: 5.0000 mg | ORAL_TABLET | Freq: Four times a day (QID) | ORAL | Status: DC | PRN

## 2015-06-11 NOTE — Telephone Encounter (Signed)
Called to request refill on her Percocet. Currently has #3 left. Has been having more upper abdominal cramping and even some rectal pain after treatment. Would like to pick up script tomorrow. Forwarded message to collaborative nurse.

## 2015-06-18 ENCOUNTER — Ambulatory Visit: Payer: BLUE CROSS/BLUE SHIELD

## 2015-06-18 ENCOUNTER — Other Ambulatory Visit: Payer: BLUE CROSS/BLUE SHIELD

## 2015-06-18 ENCOUNTER — Ambulatory Visit: Payer: BLUE CROSS/BLUE SHIELD | Admitting: Hematology

## 2015-06-19 ENCOUNTER — Telehealth: Payer: Self-pay | Admitting: *Deleted

## 2015-06-19 ENCOUNTER — Ambulatory Visit (HOSPITAL_BASED_OUTPATIENT_CLINIC_OR_DEPARTMENT_OTHER): Payer: BLUE CROSS/BLUE SHIELD

## 2015-06-19 ENCOUNTER — Ambulatory Visit: Payer: BLUE CROSS/BLUE SHIELD | Admitting: Hematology

## 2015-06-19 ENCOUNTER — Telehealth: Payer: Self-pay | Admitting: Hematology

## 2015-06-19 ENCOUNTER — Other Ambulatory Visit (HOSPITAL_BASED_OUTPATIENT_CLINIC_OR_DEPARTMENT_OTHER): Payer: BLUE CROSS/BLUE SHIELD

## 2015-06-19 ENCOUNTER — Other Ambulatory Visit: Payer: BLUE CROSS/BLUE SHIELD

## 2015-06-19 VITALS — BP 144/76 | HR 67 | Temp 98.7°F | Resp 20

## 2015-06-19 DIAGNOSIS — Z5111 Encounter for antineoplastic chemotherapy: Secondary | ICD-10-CM | POA: Diagnosis not present

## 2015-06-19 DIAGNOSIS — C189 Malignant neoplasm of colon, unspecified: Secondary | ICD-10-CM

## 2015-06-19 DIAGNOSIS — C787 Secondary malignant neoplasm of liver and intrahepatic bile duct: Secondary | ICD-10-CM

## 2015-06-19 DIAGNOSIS — Z5112 Encounter for antineoplastic immunotherapy: Secondary | ICD-10-CM

## 2015-06-19 LAB — COMPREHENSIVE METABOLIC PANEL (CC13)
ALBUMIN: 3.7 g/dL (ref 3.5–5.0)
ALK PHOS: 96 U/L (ref 40–150)
ALT: 11 U/L (ref 0–55)
ANION GAP: 10 meq/L (ref 3–11)
AST: 16 U/L (ref 5–34)
BILIRUBIN TOTAL: 0.67 mg/dL (ref 0.20–1.20)
BUN: 8.7 mg/dL (ref 7.0–26.0)
CALCIUM: 10.1 mg/dL (ref 8.4–10.4)
CO2: 26 mEq/L (ref 22–29)
Chloride: 104 mEq/L (ref 98–109)
Creatinine: 1.1 mg/dL (ref 0.6–1.1)
EGFR: 67 mL/min/{1.73_m2} — AB (ref >90)
Glucose: 125 mg/dl (ref 70–140)
POTASSIUM: 3.7 meq/L (ref 3.5–5.1)
Sodium: 141 mEq/L (ref 136–145)
TOTAL PROTEIN: 8 g/dL (ref 6.4–8.3)

## 2015-06-19 LAB — CBC WITH DIFFERENTIAL/PLATELET
BASO%: 0.4 % (ref 0.0–2.0)
Basophils Absolute: 0 10*3/uL (ref 0.0–0.1)
EOS%: 7 % (ref 0.0–7.0)
Eosinophils Absolute: 0.4 10*3/uL (ref 0.0–0.5)
HCT: 40.4 % (ref 34.8–46.6)
HEMOGLOBIN: 12.3 g/dL (ref 11.6–15.9)
LYMPH#: 1.7 10*3/uL (ref 0.9–3.3)
LYMPH%: 31.6 % (ref 14.0–49.7)
MCH: 22.4 pg — AB (ref 25.1–34.0)
MCHC: 30.4 g/dL — ABNORMAL LOW (ref 31.5–36.0)
MCV: 73.7 fL — AB (ref 79.5–101.0)
MONO#: 0.4 10*3/uL (ref 0.1–0.9)
MONO%: 7.8 % (ref 0.0–14.0)
NEUT#: 2.8 10*3/uL (ref 1.5–6.5)
NEUT%: 53.2 % (ref 38.4–76.8)
NRBC: 0 % (ref 0–0)
PLATELETS: 430 10*3/uL — AB (ref 145–400)
RBC: 5.48 10*6/uL — ABNORMAL HIGH (ref 3.70–5.45)
RDW: 21.7 % — AB (ref 11.2–14.5)
WBC: 5.3 10*3/uL (ref 3.9–10.3)

## 2015-06-19 LAB — UA PROTEIN, DIPSTICK - CHCC: PROTEIN: 30 mg/dL

## 2015-06-19 MED ORDER — LEUCOVORIN CALCIUM INJECTION 350 MG
400.0000 mg/m2 | Freq: Once | INTRAVENOUS | Status: AC
  Administered 2015-06-19: 732 mg via INTRAVENOUS
  Filled 2015-06-19: qty 36.6

## 2015-06-19 MED ORDER — FLUOROURACIL CHEMO INJECTION 5 GM/100ML
2400.0000 mg/m2 | INTRAVENOUS | Status: AC
  Administered 2015-06-19: 4400 mg via INTRAVENOUS
  Filled 2015-06-19: qty 88

## 2015-06-19 MED ORDER — FLUOROURACIL CHEMO INJECTION 2.5 GM/50ML
400.0000 mg/m2 | Freq: Once | INTRAVENOUS | Status: AC
  Administered 2015-06-19: 750 mg via INTRAVENOUS
  Filled 2015-06-19: qty 15

## 2015-06-19 MED ORDER — IRINOTECAN HCL CHEMO INJECTION 100 MG/5ML
180.0000 mg/m2 | Freq: Once | INTRAVENOUS | Status: AC
  Administered 2015-06-19: 330 mg via INTRAVENOUS
  Filled 2015-06-19: qty 16.5

## 2015-06-19 MED ORDER — SODIUM CHLORIDE 0.9 % IV SOLN
Freq: Once | INTRAVENOUS | Status: AC
  Administered 2015-06-19: 13:00:00 via INTRAVENOUS

## 2015-06-19 MED ORDER — SODIUM CHLORIDE 0.9 % IV SOLN
Freq: Once | INTRAVENOUS | Status: AC
  Administered 2015-06-19: 13:00:00 via INTRAVENOUS
  Filled 2015-06-19: qty 8

## 2015-06-19 MED ORDER — ATROPINE SULFATE 1 MG/ML IJ SOLN
INTRAMUSCULAR | Status: AC
  Filled 2015-06-19: qty 1

## 2015-06-19 MED ORDER — SODIUM CHLORIDE 0.9 % IV SOLN
5.0000 mg/kg | Freq: Once | INTRAVENOUS | Status: AC
  Administered 2015-06-19: 350 mg via INTRAVENOUS
  Filled 2015-06-19: qty 14

## 2015-06-19 MED ORDER — ATROPINE SULFATE 1 MG/ML IJ SOLN
0.5000 mg | Freq: Once | INTRAMUSCULAR | Status: AC | PRN
  Administered 2015-06-19: 0.5 mg via INTRAVENOUS

## 2015-06-19 NOTE — Patient Instructions (Signed)
[facility] Discharge Instructions for Patients Receiving Chemotherapy  Today you received the following chemotherapy agents: Avastin, Leucovorin, Adrucil, Irinotecan   To help prevent nausea and vomiting after your treatment, we encourage you to take your nausea medication as directed.    If you develop nausea and vomiting that is not controlled by your nausea medication, call the clinic.   BELOW ARE SYMPTOMS THAT SHOULD BE REPORTED IMMEDIATELY:  *FEVER GREATER THAN 100.5 F  *CHILLS WITH OR WITHOUT FEVER  NAUSEA AND VOMITING THAT IS NOT CONTROLLED WITH YOUR NAUSEA MEDICATION  *UNUSUAL SHORTNESS OF BREATH  *UNUSUAL BRUISING OR BLEEDING  TENDERNESS IN MOUTH AND THROAT WITH OR WITHOUT PRESENCE OF ULCERS  *URINARY PROBLEMS  *BOWEL PROBLEMS  UNUSUAL RASH Items with * indicate a potential emergency and should be followed up as soon as possible.  Feel free to call the clinic you have any questions or concerns. The clinic phone number is [phone].  Please show the [place] at check-in to the Emergency Department and triage nurse.

## 2015-06-19 NOTE — Telephone Encounter (Signed)
Per staff message and POF I have scheduled appts. Advised scheduler of appts. JMW  

## 2015-06-19 NOTE — Telephone Encounter (Signed)
pt cldto CX appt-stated will call back to r/s ata later date

## 2015-06-20 LAB — CEA: CEA: 457.8 ng/mL — ABNORMAL HIGH (ref 0.0–5.0)

## 2015-06-21 ENCOUNTER — Ambulatory Visit (HOSPITAL_BASED_OUTPATIENT_CLINIC_OR_DEPARTMENT_OTHER): Payer: BLUE CROSS/BLUE SHIELD

## 2015-06-21 VITALS — BP 138/87 | HR 85 | Temp 98.5°F

## 2015-06-21 DIAGNOSIS — C787 Secondary malignant neoplasm of liver and intrahepatic bile duct: Principal | ICD-10-CM

## 2015-06-21 DIAGNOSIS — C189 Malignant neoplasm of colon, unspecified: Secondary | ICD-10-CM

## 2015-06-21 MED ORDER — SODIUM CHLORIDE 0.9 % IJ SOLN
10.0000 mL | INTRAMUSCULAR | Status: DC | PRN
  Administered 2015-06-21: 10 mL
  Filled 2015-06-21: qty 10

## 2015-06-21 MED ORDER — HEPARIN SOD (PORK) LOCK FLUSH 100 UNIT/ML IV SOLN
500.0000 [IU] | Freq: Once | INTRAVENOUS | Status: AC | PRN
  Administered 2015-06-21: 500 [IU]
  Filled 2015-06-21: qty 5

## 2015-06-23 ENCOUNTER — Telehealth: Payer: Self-pay | Admitting: Nurse Practitioner

## 2015-06-23 ENCOUNTER — Telehealth: Payer: Self-pay | Admitting: *Deleted

## 2015-06-23 NOTE — Telephone Encounter (Signed)
per pof to sch ptappt-cld pt and left message of appt time & date on 11/23-adv ptto get updated avs at that time

## 2015-06-23 NOTE — Telephone Encounter (Signed)
Per staff message and POF I have scheduled appts. Advised scheduler of appts. JMW  

## 2015-07-02 ENCOUNTER — Other Ambulatory Visit (HOSPITAL_BASED_OUTPATIENT_CLINIC_OR_DEPARTMENT_OTHER): Payer: BLUE CROSS/BLUE SHIELD

## 2015-07-02 ENCOUNTER — Ambulatory Visit (HOSPITAL_BASED_OUTPATIENT_CLINIC_OR_DEPARTMENT_OTHER): Payer: BLUE CROSS/BLUE SHIELD | Admitting: Nurse Practitioner

## 2015-07-02 ENCOUNTER — Other Ambulatory Visit: Payer: Self-pay | Admitting: *Deleted

## 2015-07-02 ENCOUNTER — Encounter: Payer: Self-pay | Admitting: Nurse Practitioner

## 2015-07-02 ENCOUNTER — Ambulatory Visit (HOSPITAL_BASED_OUTPATIENT_CLINIC_OR_DEPARTMENT_OTHER): Payer: BLUE CROSS/BLUE SHIELD

## 2015-07-02 VITALS — BP 140/81 | HR 83 | Temp 98.6°F | Resp 18 | Ht 66.0 in | Wt 160.1 lb

## 2015-07-02 DIAGNOSIS — C188 Malignant neoplasm of overlapping sites of colon: Secondary | ICD-10-CM | POA: Diagnosis not present

## 2015-07-02 DIAGNOSIS — K6289 Other specified diseases of anus and rectum: Secondary | ICD-10-CM

## 2015-07-02 DIAGNOSIS — D508 Other iron deficiency anemias: Secondary | ICD-10-CM

## 2015-07-02 DIAGNOSIS — Z5112 Encounter for antineoplastic immunotherapy: Secondary | ICD-10-CM | POA: Diagnosis not present

## 2015-07-02 DIAGNOSIS — C189 Malignant neoplasm of colon, unspecified: Secondary | ICD-10-CM

## 2015-07-02 DIAGNOSIS — C787 Secondary malignant neoplasm of liver and intrahepatic bile duct: Secondary | ICD-10-CM | POA: Diagnosis not present

## 2015-07-02 DIAGNOSIS — R112 Nausea with vomiting, unspecified: Secondary | ICD-10-CM

## 2015-07-02 LAB — COMPREHENSIVE METABOLIC PANEL (CC13)
ALBUMIN: 3.6 g/dL (ref 3.5–5.0)
ALK PHOS: 82 U/L (ref 40–150)
ALT: <9 U/L (ref 0–55)
AST: 15 U/L (ref 5–34)
Anion Gap: 9 mEq/L (ref 3–11)
BILIRUBIN TOTAL: 0.6 mg/dL (ref 0.20–1.20)
BUN: 8.1 mg/dL (ref 7.0–26.0)
CALCIUM: 9.8 mg/dL (ref 8.4–10.4)
CO2: 28 mEq/L (ref 22–29)
Chloride: 105 mEq/L (ref 98–109)
Creatinine: 1 mg/dL (ref 0.6–1.1)
EGFR: 76 mL/min/{1.73_m2} — AB (ref >90)
GLUCOSE: 105 mg/dL (ref 70–140)
Potassium: 3.7 mEq/L (ref 3.5–5.1)
SODIUM: 142 meq/L (ref 136–145)
TOTAL PROTEIN: 7.5 g/dL (ref 6.4–8.3)

## 2015-07-02 LAB — CBC WITH DIFFERENTIAL/PLATELET
BASO%: 0.2 % (ref 0.0–2.0)
Basophils Absolute: 0 10*3/uL (ref 0.0–0.1)
EOS ABS: 0.2 10*3/uL (ref 0.0–0.5)
EOS%: 3.6 % (ref 0.0–7.0)
HEMATOCRIT: 37.9 % (ref 34.8–46.6)
HEMOGLOBIN: 11.7 g/dL (ref 11.6–15.9)
LYMPH%: 19.9 % (ref 14.0–49.7)
MCH: 22.7 pg — ABNORMAL LOW (ref 25.1–34.0)
MCHC: 31 g/dL — ABNORMAL LOW (ref 31.5–36.0)
MCV: 73.4 fL — AB (ref 79.5–101.0)
MONO#: 0.5 10*3/uL (ref 0.1–0.9)
MONO%: 8.6 % (ref 0.0–14.0)
NEUT%: 67.7 % (ref 38.4–76.8)
NEUTROS ABS: 3.9 10*3/uL (ref 1.5–6.5)
Platelets: 375 10*3/uL (ref 145–400)
RBC: 5.17 10*6/uL (ref 3.70–5.45)
RDW: 20.6 % — ABNORMAL HIGH (ref 11.2–14.5)
WBC: 5.7 10*3/uL (ref 3.9–10.3)
lymph#: 1.1 10*3/uL (ref 0.9–3.3)

## 2015-07-02 LAB — UA PROTEIN, DIPSTICK - CHCC: Protein, ur: 100 mg/dL

## 2015-07-02 MED ORDER — SODIUM CHLORIDE 0.9 % IV SOLN
5.0000 mg/kg | Freq: Once | INTRAVENOUS | Status: AC
  Administered 2015-07-02: 350 mg via INTRAVENOUS
  Filled 2015-07-02: qty 14

## 2015-07-02 MED ORDER — PROCHLORPERAZINE MALEATE 10 MG PO TABS: 10.0000 mg | ORAL_TABLET | Freq: Four times a day (QID) | ORAL | Status: DC | PRN

## 2015-07-02 MED ORDER — LEUCOVORIN CALCIUM INJECTION 350 MG
400.0000 mg/m2 | Freq: Once | INTRAVENOUS | Status: AC
  Administered 2015-07-02: 732 mg via INTRAVENOUS
  Filled 2015-07-02: qty 36.6

## 2015-07-02 MED ORDER — SODIUM CHLORIDE 0.9 % IV SOLN
2400.0000 mg/m2 | INTRAVENOUS | Status: DC
  Administered 2015-07-02: 4400 mg via INTRAVENOUS
  Filled 2015-07-02: qty 88

## 2015-07-02 MED ORDER — ATROPINE SULFATE 1 MG/ML IJ SOLN
INTRAMUSCULAR | Status: AC
  Filled 2015-07-02: qty 1

## 2015-07-02 MED ORDER — FLUOROURACIL CHEMO INJECTION 2.5 GM/50ML
400.0000 mg/m2 | Freq: Once | INTRAVENOUS | Status: AC
  Administered 2015-07-02: 750 mg via INTRAVENOUS
  Filled 2015-07-02: qty 15

## 2015-07-02 MED ORDER — OXYCODONE HCL 5 MG PO TABS: 5.0000 mg | ORAL_TABLET | Freq: Four times a day (QID) | ORAL | Status: DC | PRN

## 2015-07-02 MED ORDER — LIDOCAINE-PRILOCAINE 2.5-2.5 % EX CREA: 1.0000 "application " | TOPICAL_CREAM | CUTANEOUS | Status: DC | PRN

## 2015-07-02 MED ORDER — DEXAMETHASONE SODIUM PHOSPHATE 100 MG/10ML IJ SOLN
Freq: Once | INTRAMUSCULAR | Status: AC
  Administered 2015-07-02: 13:00:00 via INTRAVENOUS
  Filled 2015-07-02: qty 8

## 2015-07-02 MED ORDER — IRINOTECAN HCL CHEMO INJECTION 100 MG/5ML
180.0000 mg/m2 | Freq: Once | INTRAVENOUS | Status: AC
  Administered 2015-07-02: 330 mg via INTRAVENOUS
  Filled 2015-07-02: qty 16.5

## 2015-07-02 MED ORDER — ATROPINE SULFATE 1 MG/ML IJ SOLN
0.5000 mg | Freq: Once | INTRAMUSCULAR | Status: AC | PRN
  Administered 2015-07-02: 0.5 mg via INTRAVENOUS

## 2015-07-02 MED ORDER — OXYCODONE HCL 5 MG PO TABS
5.0000 mg | ORAL_TABLET | Freq: Once | ORAL | Status: AC
  Administered 2015-07-02: 5 mg via ORAL
  Filled 2015-07-02: qty 1

## 2015-07-02 MED ORDER — SODIUM CHLORIDE 0.9 % IV SOLN
Freq: Once | INTRAVENOUS | Status: AC
  Administered 2015-07-02: 12:00:00 via INTRAVENOUS

## 2015-07-02 NOTE — Progress Notes (Signed)
Patient reports having problems with nausea at home. She has tried multiple things for this.  Gave patient peppermint essential oil on gauze in cup for inhalation at home. 1545:  Patient states the peppernint oil was "fantastic" and really helped her nausea.

## 2015-07-02 NOTE — Patient Instructions (Signed)
[facility] Discharge Instructions for Patients Receiving Chemotherapy  Today you received the following chemotherapy agents: Avastin, Leucovorin, Adrucil, Irinotecan   To help prevent nausea and vomiting after your treatment, we encourage you to take your nausea medication as directed.    If you develop nausea and vomiting that is not controlled by your nausea medication, call the clinic.   BELOW ARE SYMPTOMS THAT SHOULD BE REPORTED IMMEDIATELY:  *FEVER GREATER THAN 100.5 F  *CHILLS WITH OR WITHOUT FEVER  NAUSEA AND VOMITING THAT IS NOT CONTROLLED WITH YOUR NAUSEA MEDICATION  *UNUSUAL SHORTNESS OF BREATH  *UNUSUAL BRUISING OR BLEEDING  TENDERNESS IN MOUTH AND THROAT WITH OR WITHOUT PRESENCE OF ULCERS  *URINARY PROBLEMS  *BOWEL PROBLEMS  UNUSUAL RASH Items with * indicate a potential emergency and should be followed up as soon as possible.  Feel free to call the clinic you have any questions or concerns. The clinic phone number is [phone].  Please show the [place] at check-in to the Emergency Department and triage nurse.

## 2015-07-02 NOTE — Progress Notes (Signed)
[place]  [phone]) [phone] [phone]) [phone]  Clinic follow up Note   Patient Care Team: Benito Mccreedy, MD as PCP - General (Internal Medicine) Juanita Craver, MD as Consulting Physician (Gastroenterology) Michael Boston, MD as Consulting Physician (General Surgery) Truitt Merle, MD as Consulting Physician (Oncology) 07/02/2015  CHIEF COMPLAINTS:  Follow up metastatic colon cancer      Oncology History   Metastatic colon cancer to liver   Staging form: Colon and Rectum, AJCC 7th Edition     Pathologic stage from 04/08/2015: T3, N0, M1 - Signed by Truitt Merle, MD on 04/22/2015  Presented to ER with intractable N/V; intermittent rectal bleeding X 3 months      Metastatic colon cancer to liver ([place])   04/03/2015 Procedure Colonoscopy showed a obstructing sigmoid rectal mass. Biopsy showed adenocarcinoma.   04/06/2015 Tumor Marker CEA=467.3 / CA19.9=1605   04/06/2015 Imaging CT chest, abdomen and pelvis with contrast showed sigmoid colon rectal mass, multiple (4) liver metastasis, with the largest 9.1 x 6.1 cm mass in the left lobe..   04/08/2015 Initial Diagnosis Metastatic colon cancer to liver   04/08/2015 Surgery Laparoscopic sigmoid colectomy, liver biopsy, port cath insertion, by Dr. Johney Maine    04/08/2015 Pathology Results Sigmoid colon segmental resection showed adenocarcinoma with mucinous features, pT 3, 15 lymph nodes all negative, surgical margins were negative. Liver biopsy showed metastatic adenocarcinoma.   05/08/2015 -  Chemotherapy FOLFIRI every 2 weeks, Avastin was added on from second cycle      HISTORY OF PRESENTING ILLNESS:  Christina [name] 54 y.o. female with past medical history of endometriosis, uterine fibroids, thalassemia trait, iron deficient anemia, who was recently found to have a sigmoid rectal cancer. I initially saw her in the hospital, she is here for the first follow-up.  She has been having intermittent rectal bleeding for the past 3  months. She thought it was related to her endometriosis, did not seek medical attention. She also has intermittent mild nausea, especially in the morning, and left lower quadrant abdominal pain. She was found to have profound anemia with hemoglobin 6.5 last week, and received blood transfusion. She was referred to gastroenterologist Dr. Collene Mares last week, underwent colonoscopy 2 days ago, which showed a obstructing sigmoid rectal mass, per patient, the colonoscopy report is not available today. Biopsy was done, but the pathology report is still pending.  She developed severe nausea, and vomited several times with clear gastric liquid. She called [name], and I was told to come to [facility] emergency room by on-call physician Dr. Carlean Purl. She had a CT scan done in the emergency room today.  Her appetite was fairly normal up to 2 days ago before recent hospital admission, when she was found to have a colorectal mass. She lost about 45 pounds in the past few weeks. 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 family history was positive for colon cancer in her maternal cousin. She is married, lives with her husband, no children.  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 Originally she was tolerating the chemotherapy well, but since the last cycle she has had constant nausea. She is using zofran as directed, and it is only minimally helpful. She also cites more fatigue than usual and increased rectal pain. She is using more oxycodone and is anxious and hesitant to have bowel movements due to the pain. She uses miralax and the bowel movements are  soft. She denies fevers or chills. She has no mouth sores, rashes, or neuropathy symptoms. Her appetite is fair and her weight is stable.. Her skin and hair are dry.   MEDICAL HISTORY:  Past Medical History  Diagnosis Date  . Hypertension   .  Endometriosis   . Thalassanemia     SURGICAL HISTORY: Past Surgical History  Procedure Laterality Date  . Myomectomy      Gyn in [place]  . Diagnostic laparoscopy      Endometriosis  . Colon resection N/A 04/08/2015    Procedure: LAPAROSCOPIC  RESECTION OF PART OF  SIGMOID COLON;  Surgeon: Michael Boston, MD;  Location: [facility];  Service: General;  Laterality: N/A;  . Liver biopsy N/A 04/08/2015    Procedure: CORE NEEDLE LIVER BIOPSY;  Surgeon: Michael Boston, MD;  Location: [facility];  Service: General;  Laterality: N/A;  . Portacath placement N/A 04/08/2015    Procedure: INSERTION PORT-A-CATH;  Surgeon: Michael Boston, MD;  Location: [facility];  Service: General;  Laterality: N/A;  . Laparoscopic sigmoid colectomy  04/08/2015    for colorectal cancer    SOCIAL HISTORY: Social History   Social History  . Marital Status: Married    Spouse Name: N/A  . Number of Children: N/A  . Years of Education: N/A   Occupational History  . Internal Medicine doctor     Works in [place] [employer]  . Smoking status: Never Smoker   . Smokeless tobacco: Not on file  . Alcohol Use: Yes     Comment: socail drinker   . Drug Use: No  . Sexual Activity: Not on file   Other Topics Concern  . Not on file   Social History Narrative   Married, husband [name] (married X 2 years)   No children   IM Physician in [place]: Family History  Problem Relation Age of Onset  . Anesthesia problems Cousin 14    maternal cousin, colon cancer   . Clotting disorder Maternal Grandmother     ALLERGIES:  is allergic to lactose intolerance (gi); milk-related compounds; and nsaids.  MEDICATIONS:  Current Outpatient Prescriptions  Medication Sig Dispense Refill  . lidocaine-prilocaine (EMLA) cream Apply 1 application topically as needed. 30 g 6  . NIFEdipine (PROCARDIA-XL/ADALAT CC) 30 MG 24 hr tablet Take 30 mg by mouth daily.    . ondansetron (ZOFRAN) 4 MG  tablet Take 1 tablet (4 mg total) by mouth every 6 (six) hours as needed for nausea. 20 tablet 0  . oxyCODONE (OXY IR/ROXICODONE) 5 MG immediate release tablet Take 1 tablet (5 mg total) by mouth every 6 (six) hours as needed for moderate pain. 40 tablet 0  . prochlorperazine (COMPAZINE) 10 MG tablet Take 1 tablet (10 mg total) by mouth every 6 (six) hours as needed for nausea or vomiting. 30 tablet 1   Current Facility-Administered Medications  Medication Dose Route Frequency Provider Last Rate Last Dose  . oxyCODONE (Oxy IR/ROXICODONE) immediate release tablet 5 mg  5 mg Oral Once Laurie Panda, NP        REVIEW OF SYSTEMS:   Constitutional: Denies fevers, chills or abnormal night sweats, (+) fatigue Eyes: Denies blurriness of vision, double vision or watery eyes Ears, nose, mouth, throat, and face: Denies mucositis or sore throat Respiratory: Denies cough, dyspnea or wheezes Cardiovascular: Denies palpitation, chest discomfort or lower extremity swelling Gastrointestinal:  (+) nausea, (+) RUQ and rectal pain, and mild  constipation Skin: Denies abnormal skin rashes Lymphatics: Denies new lymphadenopathy or easy bruising Neurological:Denies numbness, tingling or new weaknesses Behavioral/Psych: Mood is stable, no new changes  All other systems were reviewed with the patient and are negative.  PHYSICAL EXAMINATION: ECOG PERFORMANCE STATUS: 1 - Symptomatic but completely ambulatory  Filed Vitals:   07/02/15 1114  BP: 140/81  Pulse: 83  Temp: 98.6 F (37 C)  Resp: 18   Filed Weights   07/02/15 1114  Weight: 160 lb 1.6 oz (72.621 kg)    GENERAL:alert, no distress and comfortable SKIN: skin color, texture, turgor are normal, no rashes or significant lesions EYES: normal, conjunctiva are pink and non-injected, sclera clear OROPHARYNX:no exudate, no erythema and lips, buccal mucosa, and tongue normal  NECK: supple, thyroid normal size, non-tender, without nodularity LYMPH:   no palpable lymphadenopathy in the cervical, axillary or inguinal LUNGS: clear to auscultation and percussion with normal breathing effort HEART: regular rate & rhythm and no murmurs and no lower extremity edema ABDOMEN:abdomen soft, non-tender and normal bowel sounds Musculoskeletal:no cyanosis of digits and no clubbing  PSYCH: alert & oriented x 3 with fluent speech NEURO: no focal motor/sensory deficits  LABORATORY DATA:  I have reviewed the data as listed CBC Latest Ref Rng 07/02/2015 06/19/2015 06/05/2015  WBC 3.9 - 10.3 10e3/uL 5.7 5.3 4.4  Hemoglobin 11.6 - 15.9 g/dL 11.7 12.3 11.4(L)  Hematocrit 34.8 - 46.6 % 37.9 40.4 38.0  Platelets 145 - 400 10e3/uL 375 430(H) 404(H)    CMP Latest Ref Rng 06/19/2015 06/05/2015 05/22/2015  Glucose 70 - 140 mg/dl 125 140 123  BUN 7.0 - 26.0 mg/dL 8.7 11.2 10.4  Creatinine 0.6 - 1.1 mg/dL 1.1 1.0 1.0  Sodium 136 - 145 mEq/L 141 141 140  Potassium 3.5 - 5.1 mEq/L 3.7 3.7 3.5  Chloride 101 - 111 mmol/L - - -  CO2 22 - 29 mEq/L _0 Calcium 8.4 - 10.4 mg/dL 10.1 10.1 10.2  Total Protein 6.4 - 8.3 g/dL 8.0 8.1 8.5(H)  Total Bilirubin 0.20 - 1.20 mg/dL 0.67 0.57 0.59  Alkaline Phos 40 - 150 U/L 96 98 100  AST 5 - 34 U/L _1 ALT 0 - 55 U/L _2 CEA  Status: Finalresult Visible to patient:  MyChart Nextappt: 06/07/2015 at 12:00 PM in Oncology ([place] [facility]) Dx:  Metastatic colon cancer to liver ([place])              Ref Range 2wk ago  44moago  230mogo     CEA 0.0 - 5.0 ng/mL 582.5 (H) 299.4 (H)CM 467.3 (H)R, CM         Pathology report Diagnosis 04/08/2015 1. Liver, needle/core biopsy, ? cancer - METASTATIC ADENOCARCINOMA. 2. Colon, segmental resection for tumor, sigmoid colon mass open end proximal - COLONIC ADENOCARCINOMA WITH MUCINOUS FEATURES EXTENDING INTO PERICOLONIC ADIPOSE TISSUE AND SUBSEROSAL CONNECTIVE TISSUE. - MARGINS NOT INVOLVED. - FIFTEEN BENIGN LYMPH NODES (0/15). 3.  Colon, resection margin (donut), distal anastomic ring - BENIGN COLON. - NO EVIDENCE OF MALIGNANCY.  Microscopic Comment Specimen: Sigmoid colon with liver biopsy and anastomotic rings. Procedure: Segmental resection with liver biopsy. Tumor site: Distal sigmoid. Specimen integrity: Intact. Macroscopic intactness of mesorectum: N/A Macroscopic tumor perforation: No. Invasive tumor: Maximum size: 8 cm Histologic type(s): Colorectal adenocarcinoma with mucinous features. Histologic grade and differentiation: G2: moderately differentiated/low grade Type of polyp in which invasive carcinoma arose: No residual polyp. Microscopic extension of invasive tumor: Into  pericolonic adipose tissue and subserosal connective tissue. Lymph-Vascular invasion: Present. Peri-neural invasion: Present. Tumor deposit(s) (discontinuous extramural extension): No. Resection margins: Proximal margin: Free of tumor. Distal margin: Free of tumor. Circumferential (radial) (posterior ascending, posterior descending; lateral and posterior mid-rectum; and entire lower 1/3 rectum): N/A Mesenteric margin (sigmoid and transverse): Free of tumor. Distance closest margin (if all above margins negative): 2 cm from mesenteric margin. Treatment effect (neo-adjuvant therapy): No. Additional polyp(s): No. Non-neoplastic findings: None. Lymph nodes: number examined - 15; number positive: 0 Pathologic Staging: pT3, pN0, pM1 Ancillary studies: MMR pending. (JDP:gt, 04/10/15) 2. Mismatch Repair (MMR) Protein Immunohistochemistry (IHC) IHC Expression Result: MLH1: Preserved nuclear expression (greater 50% tumor expression) MSH2: Preserved nuclear expression (greater 50% tumor expression) MSH6: Preserved nuclear expression (greater 50% tumor expression) PMS2: Preserved nuclear expression (greater 50% tumor expression) * Internal control demonstrates intact nuclear expression Interpretation: NORMAL   FoundationOne test  result   RADIOGRAPHIC STUDIES: I have personally reviewed the radiological images as listed and agreed with the findings in the report.  Ct Chest abdomen and pelvis W Contrast 04/06/2015    IMPRESSION: No acute abnormality identified within the chest. No evidence of metastatic disease.  Several enhancing masses in the liver consistent with liver metastasis.  Masses within the pelvis likely of the Uterine origin. Given the findings within the liver, malignant neoplasm is not excluded.  A conglomerate of matted bowel loops in the mid lower abdomen. There is no evidence of small bowel obstruction.  Electronically Signed: By: Abelardo Diesel M.D. On: 04/06/2015 11:49    Ct Abdomen Pelvis W Contrast  04/06/2015   ADDENDUM REPORT: 04/06/2015 13:18  ADDENDUM: Upon further evaluation, The masslike lesion in the pelvis previously question matted small bowel loops represent 9 cm sigmoid colon cancer.   Electronically Signed   By: Abelardo Diesel M.D.   On: 04/06/2015 13:18      ASSESSMENT & PLAN:  54 year old African-American female, with past medical history of hypertension, thalassemia trait, iron deficient anemia, uterine fibroids, endometriosis, who presents with intermittent rectal bleeding, mild nausea, and mild intermittent left lower quadrant abdominal pain.   1. Sigmoid rectal adenocarcinoma, with liver metastases, pT3N0M1, stage IV, MMR normal, NRAS and BRAF mutation (+)  -I reviewed her surgical pathology findings with her in great details. Her liver biopsy confirmed metastatic adenocarcinoma  From colon cancer. Her primary sigmoid rectal tumor has been completely removed. -We again reviewed her CT findings, it showed at least 4 liver metastatic lesion, with the largest one measuring 9.1 cm in the left upper lobe. No other distant metastasis -We reviewed the natural history of metastatic colon cancer, and incurable nature at this is stage, giving her large metastatic disease including the liver. We  also discussed that if she had excellent response to chemotherapy, I'll present her restaging CT scan in our GI tumor Board, to see if she is a candidate for liver surgery or liver targeted therapy to maximally controlled her disease. -She tolerating first-line chemotherapy FOLFIRI well, Avastin added from cycle 2  -Lab reviewed, adequate for treatment, we'll proceed cycle 5 chemotherapy. -She has mild proteinuria, likely secondary to hypertension, watch it closely. -Her CEA level came down after her surgery, but still remains high. -will repeat CT scans after 4-5 cycle chemo  -increased rectal pain since last cycle. Using oxycodone more regularly. Anxiety surrounding bowel movements.  2. Nausea -zofran 4mg  every 6 hours, ineffective -compazine 10mg  every 6 hours added today  2. Iron deficient Anemia -Secondary to GI  bleeding and iron deficiency -Ferritin level was 8, consistent with iron deficiency from GI bleeding -She received Feraheme 510 mg twice, will repeat her iron level next month  -Her anemia has significantly improved, hemoglobin 11.7 today   3. RUQ abdominal pain -much improved  -Likely related to her colon surgery and liver biopsy  4. Weight loss and malnutrition -I encouraged her to increase calorie and protein intake, and gain some weight back -follow up with dietician  -Her weight has been stable lately.  Plan -cycle 5 FOLFIRI and avastin today -repeat CT abdomen pelvis ordered for early December -return to clinic in 2 weeks for review of scans, and potentially cycle 6 of treatment   All questions were answered. The patient knows to call the clinic with any problems, questions or concerns. I spent 15 minutes counseling the patient face to face. The total time spent in the appointment was 20 minutes and more than 50% was on counseling.     Laurie Panda, NP 07/02/2015 11:49 AM

## 2015-07-02 NOTE — Progress Notes (Signed)
Per Christina Conn NP, okay to tx with urine protein 100.

## 2015-07-04 ENCOUNTER — Ambulatory Visit (HOSPITAL_BASED_OUTPATIENT_CLINIC_OR_DEPARTMENT_OTHER): Payer: BLUE CROSS/BLUE SHIELD

## 2015-07-04 VITALS — BP 131/87 | HR 89 | Temp 98.5°F | Resp 18

## 2015-07-04 DIAGNOSIS — C787 Secondary malignant neoplasm of liver and intrahepatic bile duct: Secondary | ICD-10-CM

## 2015-07-04 DIAGNOSIS — C189 Malignant neoplasm of colon, unspecified: Secondary | ICD-10-CM

## 2015-07-04 DIAGNOSIS — C188 Malignant neoplasm of overlapping sites of colon: Secondary | ICD-10-CM

## 2015-07-04 MED ORDER — SODIUM CHLORIDE 0.9 % IJ SOLN
10.0000 mL | INTRAMUSCULAR | Status: DC | PRN
  Administered 2015-07-04: 10 mL
  Filled 2015-07-04: qty 10

## 2015-07-04 MED ORDER — HEPARIN SOD (PORK) LOCK FLUSH 100 UNIT/ML IV SOLN
500.0000 [IU] | Freq: Once | INTRAVENOUS | Status: AC | PRN
  Administered 2015-07-04: 500 [IU]
  Filled 2015-07-04: qty 5

## 2015-07-14 ENCOUNTER — Encounter (HOSPITAL_COMMUNITY): Payer: Self-pay

## 2015-07-14 ENCOUNTER — Telehealth: Payer: Self-pay | Admitting: Hematology

## 2015-07-14 ENCOUNTER — Ambulatory Visit (HOSPITAL_COMMUNITY)
Admission: RE | Admit: 2015-07-14 | Discharge: 2015-07-14 | Disposition: A | Discharge status: Home / Self Care | Payer: BLUE CROSS/BLUE SHIELD | Source: Ambulatory Visit | Attending: Nurse Practitioner | Admitting: Nurse Practitioner

## 2015-07-14 ENCOUNTER — Other Ambulatory Visit: Payer: Self-pay | Admitting: Nurse Practitioner

## 2015-07-14 ENCOUNTER — Telehealth: Payer: Self-pay | Admitting: *Deleted

## 2015-07-14 DIAGNOSIS — C189 Malignant neoplasm of colon, unspecified: Secondary | ICD-10-CM

## 2015-07-14 DIAGNOSIS — C787 Secondary malignant neoplasm of liver and intrahepatic bile duct: Secondary | ICD-10-CM | POA: Insufficient documentation

## 2015-07-14 DIAGNOSIS — K6389 Other specified diseases of intestine: Secondary | ICD-10-CM | POA: Diagnosis not present

## 2015-07-14 DIAGNOSIS — R1031 Right lower quadrant pain: Secondary | ICD-10-CM | POA: Insufficient documentation

## 2015-07-14 DIAGNOSIS — D259 Leiomyoma of uterus, unspecified: Secondary | ICD-10-CM | POA: Insufficient documentation

## 2015-07-14 HISTORY — DX: Malignant (primary) neoplasm, unspecified: C80.1

## 2015-07-14 MED ORDER — IOHEXOL 300 MG/ML  SOLN
100.0000 mL | Freq: Once | INTRAMUSCULAR | Status: AC | PRN
  Administered 2015-07-14: 100 mL via INTRAVENOUS

## 2015-07-14 NOTE — Telephone Encounter (Signed)
per pof to sch pt appt-cld & spoke to pt and gave appt time & date °

## 2015-07-14 NOTE — Telephone Encounter (Signed)
  Oncology Nurse Navigator Documentation    Navigator Encounter Type: Telephone (07/14/15 1417): VM from patient that she can't come on 07/16/15 for her appointments. She had requested at her first visit here needing to have her appointments on Thursday or Friday only. 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 Forwarded her message to scheduler.

## 2015-07-14 NOTE — Telephone Encounter (Signed)
per pof to r/s pa appt-sent MW email to sch pt trmt to coordinate w/MD appt-will call pt after reply

## 2015-07-16 ENCOUNTER — Other Ambulatory Visit: Payer: BLUE CROSS/BLUE SHIELD

## 2015-07-16 ENCOUNTER — Ambulatory Visit: Payer: BLUE CROSS/BLUE SHIELD

## 2015-07-16 ENCOUNTER — Ambulatory Visit: Payer: BLUE CROSS/BLUE SHIELD | Admitting: Hematology

## 2015-07-17 ENCOUNTER — Ambulatory Visit (HOSPITAL_BASED_OUTPATIENT_CLINIC_OR_DEPARTMENT_OTHER): Payer: BLUE CROSS/BLUE SHIELD

## 2015-07-17 ENCOUNTER — Other Ambulatory Visit (HOSPITAL_BASED_OUTPATIENT_CLINIC_OR_DEPARTMENT_OTHER): Payer: BLUE CROSS/BLUE SHIELD

## 2015-07-17 ENCOUNTER — Encounter: Payer: Self-pay | Admitting: Hematology

## 2015-07-17 ENCOUNTER — Telehealth: Payer: Self-pay | Admitting: *Deleted

## 2015-07-17 ENCOUNTER — Ambulatory Visit (HOSPITAL_BASED_OUTPATIENT_CLINIC_OR_DEPARTMENT_OTHER): Payer: BLUE CROSS/BLUE SHIELD | Admitting: Hematology

## 2015-07-17 ENCOUNTER — Telehealth: Payer: Self-pay | Admitting: Hematology

## 2015-07-17 VITALS — BP 154/75 | HR 68

## 2015-07-17 VITALS — BP 155/90 | HR 92 | Temp 98.9°F | Resp 18 | Ht 66.0 in | Wt 158.0 lb

## 2015-07-17 DIAGNOSIS — D569 Thalassemia, unspecified: Secondary | ICD-10-CM

## 2015-07-17 DIAGNOSIS — I10 Essential (primary) hypertension: Secondary | ICD-10-CM

## 2015-07-17 DIAGNOSIS — C19 Malignant neoplasm of rectosigmoid junction: Secondary | ICD-10-CM

## 2015-07-17 DIAGNOSIS — Z5111 Encounter for antineoplastic chemotherapy: Secondary | ICD-10-CM

## 2015-07-17 DIAGNOSIS — E46 Unspecified protein-calorie malnutrition: Secondary | ICD-10-CM

## 2015-07-17 DIAGNOSIS — D500 Iron deficiency anemia secondary to blood loss (chronic): Secondary | ICD-10-CM

## 2015-07-17 DIAGNOSIS — D509 Iron deficiency anemia, unspecified: Secondary | ICD-10-CM

## 2015-07-17 DIAGNOSIS — R110 Nausea: Secondary | ICD-10-CM

## 2015-07-17 DIAGNOSIS — R1011 Right upper quadrant pain: Secondary | ICD-10-CM

## 2015-07-17 DIAGNOSIS — R634 Abnormal weight loss: Secondary | ICD-10-CM

## 2015-07-17 DIAGNOSIS — C189 Malignant neoplasm of colon, unspecified: Secondary | ICD-10-CM

## 2015-07-17 DIAGNOSIS — Z5112 Encounter for antineoplastic immunotherapy: Secondary | ICD-10-CM

## 2015-07-17 DIAGNOSIS — C787 Secondary malignant neoplasm of liver and intrahepatic bile duct: Secondary | ICD-10-CM | POA: Diagnosis not present

## 2015-07-17 LAB — CBC WITH DIFFERENTIAL/PLATELET
BASO%: 0.4 % (ref 0.0–2.0)
BASOS ABS: 0 10*3/uL (ref 0.0–0.1)
EOS ABS: 0.1 10*3/uL (ref 0.0–0.5)
EOS%: 2.1 % (ref 0.0–7.0)
HEMATOCRIT: 38.7 % (ref 34.8–46.6)
HGB: 11.9 g/dL (ref 11.6–15.9)
LYMPH#: 1.3 10*3/uL (ref 0.9–3.3)
LYMPH%: 26.9 % (ref 14.0–49.7)
MCH: 23.2 pg — AB (ref 25.1–34.0)
MCHC: 30.7 g/dL — AB (ref 31.5–36.0)
MCV: 75.4 fL — AB (ref 79.5–101.0)
MONO#: 0.7 10*3/uL (ref 0.1–0.9)
MONO%: 13.9 % (ref 0.0–14.0)
NEUT#: 2.7 10*3/uL (ref 1.5–6.5)
NEUT%: 56.7 % (ref 38.4–76.8)
PLATELETS: 354 10*3/uL (ref 145–400)
RBC: 5.13 10*6/uL (ref 3.70–5.45)
RDW: 20.1 % — ABNORMAL HIGH (ref 11.2–14.5)
WBC: 4.8 10*3/uL (ref 3.9–10.3)

## 2015-07-17 LAB — UA PROTEIN, DIPSTICK - CHCC: Protein, ur: NEGATIVE mg/dL

## 2015-07-17 LAB — COMPREHENSIVE METABOLIC PANEL
ALBUMIN: 3.6 g/dL (ref 3.5–5.0)
ALK PHOS: 91 U/L (ref 40–150)
ALT: <9 U/L (ref 0–55)
ANION GAP: 12 meq/L — AB (ref 3–11)
AST: 14 U/L (ref 5–34)
BUN: 7.9 mg/dL (ref 7.0–26.0)
CO2: 25 mEq/L (ref 22–29)
Calcium: 10.1 mg/dL (ref 8.4–10.4)
Chloride: 105 mEq/L (ref 98–109)
Creatinine: 0.9 mg/dL (ref 0.6–1.1)
EGFR: 87 mL/min/{1.73_m2} — AB (ref >90)
GLUCOSE: 98 mg/dL (ref 70–140)
POTASSIUM: 3.3 meq/L — AB (ref 3.5–5.1)
Sodium: 142 mEq/L (ref 136–145)
Total Bilirubin: 0.6 mg/dL (ref 0.20–1.20)
Total Protein: 8.1 g/dL (ref 6.4–8.3)

## 2015-07-17 MED ORDER — ATROPINE SULFATE 1 MG/ML IJ SOLN
0.5000 mg | Freq: Once | INTRAMUSCULAR | Status: AC | PRN
  Administered 2015-07-17: 0.5 mg via INTRAVENOUS

## 2015-07-17 MED ORDER — SODIUM CHLORIDE 0.9 % IV SOLN
2400.0000 mg/m2 | INTRAVENOUS | Status: DC
  Administered 2015-07-17: 4400 mg via INTRAVENOUS
  Filled 2015-07-17: qty 88

## 2015-07-17 MED ORDER — SODIUM CHLORIDE 0.9 % IV SOLN
Freq: Once | INTRAVENOUS | Status: AC
  Administered 2015-07-17: 13:00:00 via INTRAVENOUS

## 2015-07-17 MED ORDER — FLUOROURACIL CHEMO INJECTION 2.5 GM/50ML
400.0000 mg/m2 | Freq: Once | INTRAVENOUS | Status: AC
  Administered 2015-07-17: 750 mg via INTRAVENOUS
  Filled 2015-07-17: qty 15

## 2015-07-17 MED ORDER — POTASSIUM CHLORIDE CRYS ER 20 MEQ PO TBCR: 20.0000 meq | EXTENDED_RELEASE_TABLET | Freq: Two times a day (BID) | ORAL | Status: DC

## 2015-07-17 MED ORDER — SODIUM CHLORIDE 0.9 % IV SOLN
Freq: Once | INTRAVENOUS | Status: AC
  Administered 2015-07-17: 14:00:00 via INTRAVENOUS
  Filled 2015-07-17: qty 2

## 2015-07-17 MED ORDER — PALONOSETRON HCL INJECTION 0.25 MG/5ML
0.2500 mg | Freq: Once | INTRAVENOUS | Status: AC
Start: 2015-07-17 — End: 2015-07-17
  Administered 2015-07-17: 0.25 mg via INTRAVENOUS

## 2015-07-17 MED ORDER — LEUCOVORIN CALCIUM INJECTION 350 MG
400.0000 mg/m2 | Freq: Once | INTRAVENOUS | Status: AC
  Administered 2015-07-17: 732 mg via INTRAVENOUS
  Filled 2015-07-17: qty 36.6

## 2015-07-17 MED ORDER — ATROPINE SULFATE 1 MG/ML IJ SOLN
INTRAMUSCULAR | Status: AC
  Filled 2015-07-17: qty 1

## 2015-07-17 MED ORDER — DEXAMETHASONE 4 MG PO TABS: 4.0000 mg | ORAL_TABLET | Freq: Two times a day (BID) | ORAL | Status: DC

## 2015-07-17 MED ORDER — IRINOTECAN HCL CHEMO INJECTION 100 MG/5ML
180.0000 mg/m2 | Freq: Once | INTRAVENOUS | Status: AC
  Administered 2015-07-17: 330 mg via INTRAVENOUS
  Filled 2015-07-17: qty 16.5

## 2015-07-17 MED ORDER — PALONOSETRON HCL INJECTION 0.25 MG/5ML
INTRAVENOUS | Status: AC
  Filled 2015-07-17: qty 5

## 2015-07-17 MED ORDER — SODIUM CHLORIDE 0.9 % IV SOLN
5.0000 mg/kg | Freq: Once | INTRAVENOUS | Status: AC
  Administered 2015-07-17: 350 mg via INTRAVENOUS
  Filled 2015-07-17: qty 14

## 2015-07-17 NOTE — Telephone Encounter (Signed)
Per staff message and POF I have scheduled appts. Advised scheduler of appts. JMW  

## 2015-07-17 NOTE — Telephone Encounter (Signed)
per pof to sch ptappt-sent MW email to sch pt trmt-pt to get updated copy on 12/10

## 2015-07-17 NOTE — Progress Notes (Signed)
[place]  [phone] [phone]  Clinic follow up Note   Patient Care Team: Christina Mccreedy, MD as PCP - General (Internal Medicine) Christina Craver, MD as Consulting Physician (Gastroenterology) Christina Boston, MD as Consulting Physician (General Surgery) Christina Merle, MD as Consulting Physician (Oncology) 07/17/2015  CHIEF COMPLAINTS:  Follow up metastatic colon cancer      Oncology History   Metastatic colon cancer to liver   Staging form: Colon and Rectum, AJCC 7th Edition     Pathologic stage from 04/08/2015: T3, N0, M1 - Signed by Christina Merle, MD on 04/22/2015  Presented to ER with intractable N/V; intermittent rectal bleeding X 3 months      Metastatic colon cancer to liver ([place])   04/03/2015 Procedure Colonoscopy showed a obstructing sigmoid rectal mass. Biopsy showed adenocarcinoma.   04/06/2015 Tumor Marker CEA=467.3 / CA19.9=1605   04/06/2015 Imaging CT chest, abdomen and pelvis with contrast showed sigmoid colon rectal mass, multiple (4) liver metastasis, with the largest 9.1 x 6.1 cm mass in the left lobe..   04/08/2015 Initial Diagnosis Metastatic colon cancer to liver   04/08/2015 Surgery Laparoscopic sigmoid colectomy, liver biopsy, port cath insertion, by Dr. Johney [name]    04/08/2015 Pathology Results Sigmoid colon segmental resection showed adenocarcinoma with mucinous features, pT 3, 15 lymph nodes all negative, surgical margins were negative. Liver biopsy showed metastatic adenocarcinoma.   05/08/2015 -  Chemotherapy FOLFIRI every 2 weeks, Avastin was added on from second cycle      HISTORY OF PRESENTING ILLNESS:  Christina [name] 54 y.o. female with past medical history of endometriosis, uterine fibroids, thalassemia trait, iron deficient anemia, who was recently found to have a sigmoid rectal cancer. I initially saw her in the hospital, she is here for the first follow-up.  She has been having intermittent rectal bleeding for the past 3  months. She thought it was related to her endometriosis, did not seek medical attention. She also has intermittent mild nausea, especially in the morning, and left lower quadrant abdominal pain. She was found to have profound anemia with hemoglobin 6.5 last week, and received blood transfusion. She was referred to gastroenterologist Christina [name] last week, underwent colonoscopy 2 days ago, which showed a obstructing sigmoid rectal mass, per patient, the colonoscopy report is not available today. Biopsy was done, but the pathology report is still pending.  She developed severe nausea, and vomited several times with clear gastric liquid. She called Christina [name] office, and I was told to come to [facility] emergency room by on-call physician Christina [name]. She had a CT scan done in the emergency room today.  Her appetite was fairly normal up to 2 days ago before recent hospital admission, when she was found to have a colorectal mass. She lost about 45 pounds in the past few weeks. 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 family history was positive for colon cancer in her maternal cousin. She is married, lives with her husband, no children.  CURRENT THERAPY: mFOLFOX6 and Avastin every 2 weeks, started on 05/08/2015   INTERIM HISTORY Christina [name] returns for follow-up and cycle 6 chemotherapy, accompanied by her husband. Her nausea was better after her last cycle chemo. She still has intermittent RUQ abdominal pain, for which she takes oxycodone.  Her mild fatigue is stable, weight is stable, no other new complaints.  MEDICAL HISTORY:  Past Medical History  Diagnosis Date  . Hypertension   . Endometriosis   .  Thalassanemia   . met colon ca to liver dx'd 03/2015    SURGICAL HISTORY: Past Surgical History  Procedure Laterality Date  . Myomectomy      Gyn in [place]  . Diagnostic laparoscopy      Endometriosis  . Colon resection N/A 04/08/2015     Procedure: LAPAROSCOPIC  RESECTION OF PART OF  SIGMOID COLON;  Surgeon: Christina Boston, MD;  Location: [facility];  Service: General;  Laterality: N/A;  . Liver biopsy N/A 04/08/2015    Procedure: CORE NEEDLE LIVER BIOPSY;  Surgeon: Christina Boston, MD;  Location: [facility];  Service: General;  Laterality: N/A;  . Portacath placement N/A 04/08/2015    Procedure: INSERTION PORT-A-CATH;  Surgeon: Christina Boston, MD;  Location: [facility];  Service: General;  Laterality: N/A;  . Laparoscopic sigmoid colectomy  04/08/2015    for colorectal cancer    SOCIAL HISTORY: Social History   Social History  . Marital Status: Married    Spouse Name: N/A  . Number of Children: N/A  . Years of Education: N/A   Occupational History  . Internal Medicine doctor     Works in [place] Topics  . Smoking status: Never Smoker   . Smokeless tobacco: Not on file  . Alcohol Use: Yes     Comment: socail drinker   . Drug Use: No  . Sexual Activity: Not on file   Other Topics Concern  . Not on file   Social History Narrative   Married, husband [name] (married X 2 years)   No children   IM Physician in [place]: Family History  Problem Relation Age of Onset  . Anesthesia problems Cousin 36    maternal cousin, colon cancer   . Clotting disorder Maternal Grandmother     ALLERGIES:  is allergic to lactose intolerance (gi); milk-related compounds; and nsaids.  MEDICATIONS:  Current Outpatient Prescriptions  Medication Sig Dispense Refill  . lidocaine-prilocaine (EMLA) cream Apply 1 application topically as needed. 30 g 6  . NIFEdipine (PROCARDIA-XL/ADALAT CC) 30 MG 24 hr tablet Take 30 mg by mouth daily.    . ondansetron (ZOFRAN) 4 MG tablet Take 1 tablet (4 mg total) by mouth every 6 (six) hours as needed for nausea. 20 tablet 0  . oxyCODONE (OXY IR/ROXICODONE) 5 MG immediate release tablet Take 1 tablet (5 mg total) by mouth every 6 (six) hours as needed for  moderate pain. 40 tablet 0  . prochlorperazine (COMPAZINE) 10 MG tablet Take 1 tablet (10 mg total) by mouth every 6 (six) hours as needed for nausea or vomiting. 30 tablet 1   No current facility-administered medications for this visit.    REVIEW OF SYSTEMS:   Constitutional: Denies fevers, chills or abnormal night sweats, (+) fatigue Eyes: Denies blurriness of vision, double vision or watery eyes Ears, nose, mouth, throat, and face: Denies mucositis or sore throat Respiratory: Denies cough, dyspnea or wheezes Cardiovascular: Denies palpitation, chest discomfort or lower extremity swelling Gastrointestinal:  (+) nausea, (+) RUQ and rectal pain, and mild constipation Skin: Denies abnormal skin rashes Lymphatics: Denies new lymphadenopathy or easy bruising Neurological:Denies numbness, tingling or new weaknesses Behavioral/Psych: Mood is stable, no new changes  All other systems were reviewed with the patient and are negative.  PHYSICAL EXAMINATION: ECOG PERFORMANCE STATUS: 1 - Symptomatic but completely ambulatory  Filed Vitals:   07/17/15 1125  BP: 155/90  Pulse: 92  Temp: 98.9 F (37.2 C)  Resp:  18   Filed Weights   07/17/15 1125  Weight: 158 lb (71.668 kg)    GENERAL:alert, no distress and comfortable SKIN: skin color, texture, turgor are normal, no rashes or significant lesions EYES: normal, conjunctiva are pink and non-injected, sclera clear OROPHARYNX:no exudate, no erythema and lips, buccal mucosa, and tongue normal  NECK: supple, thyroid normal size, non-tender, without nodularity LYMPH:  no palpable lymphadenopathy in the cervical, axillary or inguinal LUNGS: clear to auscultation and percussion with normal breathing effort HEART: regular rate & rhythm and no murmurs and no lower extremity edema ABDOMEN:abdomen soft, non-tender and normal bowel sounds Musculoskeletal:no cyanosis of digits and no clubbing  PSYCH: alert & oriented x 3 with fluent speech NEURO: no  focal motor/sensory deficits  LABORATORY DATA:  I have reviewed the data as listed CBC Latest Ref Rng 07/17/2015 07/02/2015 06/19/2015  WBC 3.9 - 10.3 10e3/uL 4.8 5.7 5.3  Hemoglobin 11.6 - 15.9 g/dL 11.9 11.7 12.3  Hematocrit 34.8 - 46.6 % 38.7 37.9 40.4  Platelets 145 - 400 10e3/uL 354 375 430(H)    CMP Latest Ref Rng 07/17/2015 07/02/2015 06/19/2015  Glucose 70 - 140 mg/dl 98 105 125  BUN 7.0 - 26.0 mg/dL 7.9 8.1 8.7  Creatinine 0.6 - 1.1 mg/dL 0.9 1.0 1.1  Sodium 136 - 145 mEq/L 142 142 141  Potassium 3.5 - 5.1 mEq/L 3.3(L) 3.7 3.7  Chloride 101 - 111 mmol/L - - -  CO2 22 - 29 mEq/L _0 Calcium 8.4 - 10.4 mg/dL 10.1 9.8 10.1  Total Protein 6.4 - 8.3 g/dL 8.1 7.5 8.0  Total Bilirubin 0.20 - 1.20 mg/dL 0.60 0.60 0.67  Alkaline Phos 40 - 150 U/L 91 82 96  AST 5 - 34 U/L _1 ALT 0 - 55 U/L <9 <9 11   CEA  Status: Finalresult Visible to patient:  Not Released Nextappt: 08/07/2015 at 11:15 AM in Oncology ([facility]) Dx:  Metastatic colon cancer to liver ([place])              Ref Range 3d ago (07/17/15) 61moago (06/19/15) 181mogo (05/22/15) 87m3moo (04/22/15)    CEA 0.0 - 5.0 ng/mL 477.5 (H) 457.8 (H)CM 582.5 (H)CM 299.4 (H)CM         Pathology report Diagnosis 04/08/2015 1. Liver, needle/core biopsy, ? cancer - METASTATIC ADENOCARCINOMA. 2. Colon, segmental resection for tumor, sigmoid colon mass open end proximal - COLONIC ADENOCARCINOMA WITH MUCINOUS FEATURES EXTENDING INTO PERICOLONIC ADIPOSE TISSUE AND SUBSEROSAL CONNECTIVE TISSUE. - MARGINS NOT INVOLVED. - FIFTEEN BENIGN LYMPH NODES (0/15). 3. Colon, resection margin (donut), distal anastomic ring - BENIGN COLON. - NO EVIDENCE OF MALIGNANCY.  Microscopic Comment Specimen: Sigmoid colon with liver biopsy and anastomotic rings. Procedure: Segmental resection with liver biopsy. Tumor site: Distal sigmoid. Specimen integrity: Intact. Macroscopic intactness of mesorectum:  N/A Macroscopic tumor perforation: No. Invasive tumor: Maximum size: 8 cm Histologic type(s): Colorectal adenocarcinoma with mucinous features. Histologic grade and differentiation: G2: moderately differentiated/low grade Type of polyp in which invasive carcinoma arose: No residual polyp. Microscopic extension of invasive tumor: Into pericolonic adipose tissue and subserosal connective tissue. Lymph-Vascular invasion: Present. Peri-neural invasion: Present. Tumor deposit(s) (discontinuous extramural extension): No. Resection margins: Proximal margin: Free of tumor. Distal margin: Free of tumor. Circumferential (radial) (posterior ascending, posterior descending; lateral and posterior mid-rectum; and entire lower 1/3 rectum): N/A Mesenteric margin (sigmoid and transverse): Free of tumor. Distance closest margin (if all above margins negative): 2 cm from mesenteric margin.  Treatment effect (neo-adjuvant therapy): No. Additional polyp(s): No. Non-neoplastic findings: None. Lymph nodes: number examined - 15; number positive: 0 Pathologic Staging: pT3, pN0, pM1 Ancillary studies: MMR pending. (JDP:gt, 04/10/15) 2. Mismatch Repair (MMR) Protein Immunohistochemistry (IHC) IHC Expression Result: MLH1: Preserved nuclear expression (greater 50% tumor expression) MSH2: Preserved nuclear expression (greater 50% tumor expression) MSH6: Preserved nuclear expression (greater 50% tumor expression) PMS2: Preserved nuclear expression (greater 50% tumor expression) * Internal control demonstrates intact nuclear expression Interpretation: NORMAL   [employer] test result   RADIOGRAPHIC STUDIES: I have personally reviewed the radiological images as listed and agreed with the findings in the report.  Ct abdomen and pelvis W Contrast 07/14/2015 IMPRESSION: 1. Interval slight improvement in previously demonstrated multifocal hepatic metastatic disease. 2. Interval resection of sigmoid colon mass  and anastomosis. There is nonspecific colonic wall thickening proximal to the anastomosis which may be related to interval radiation therapy. Adjacent prominent lymph nodes are also nonspecific and potentially reactive, although small metastases cannot be excluded. PET-CT may be helpful for further evaluation. 3. Similar appearance of multiple pelvic masses, probably all uterine fibroids.   ASSESSMENT & PLAN:  54 year old African-American female, with past medical history of hypertension, thalassemia trait, iron deficient anemia, uterine fibroids, endometriosis, who presents with intermittent rectal bleeding, mild nausea, and mild intermittent left lower quadrant abdominal pain.   1. Sigmoid rectal adenocarcinoma, with liver metastases, pT3N0M1, stage IV, MMR normal, NRAS and BRAF mutation (+)  -I reviewed her surgical pathology findings with her in great details. Her liver biopsy confirmed metastatic adenocarcinoma  From colon cancer. Her primary sigmoid rectal tumor has been completely removed. -We again reviewed her CT findings, it showed at least 4 liver metastatic lesion, with the largest one measuring 9.1 cm in the left lobe. No other distant metastasis -We reviewed the natural history of metastatic colon cancer, and incurable nature at this is stage, giving her large metastatic disease in the liver. We also discussed that if she had excellent response to chemotherapy, I'll present her restaging CT scan in our GI tumor Board, to see if she is a candidate for liver surgery or liver targeted therapy to maximally controlled her disease. -Her tumor has BRAF mutation, which is a poor prognostic factor  -Her tumor has NRAS mutation, not a candidate for EGFR inhibitor  -Given the MSI-stable, she might not benefit from immunotherapy, except in a clinically trial setting  -I reviewed her restaging CT scan, which showed interval slight improvement in liver, no other new lesions. Her CEA only slight;y  decreased, overall her response has been limited. Will continue current therapy. She is tolerating chemo well overall.  -lab reviewed, adequate for treatment, will proceed cycle 6 today   2. Nausea secondary to chemo  -she will continue using zofran and compazine as needed   2. Iron deficient Anemia -Secondary to GI bleeding and iron deficiency -Ferritin level was 8, consistent with iron deficiency from GI bleeding -She received Feraheme 510 mg twice, will repeat her iron level next month  -Her anemia has been stable, hemoglobin 11.9 today   3. RUQ abdominal pain -much improved  -Likely related to her surgery and liver mets   4. Weight loss and malnutrition -her weight has been stable lately -continue neutritional supplement  -follow up with dietician    Plan -cycle 6 FOLFIRI and avastin today, will postpone her next cycle for one week due to holiday -I will see her before cycle 8   All questions were answered. The patient  knows to call the clinic with any problems, questions or concerns. I spent 25 minutes counseling the patient face to face. The total time spent in the appointment was 30 minutes and more than 50% was on counseling.     Christina Merle, MD 07/17/2015

## 2015-07-18 LAB — CEA: CEA: 477.5 ng/mL — ABNORMAL HIGH (ref 0.0–5.0)

## 2015-07-19 ENCOUNTER — Ambulatory Visit (HOSPITAL_BASED_OUTPATIENT_CLINIC_OR_DEPARTMENT_OTHER): Payer: BLUE CROSS/BLUE SHIELD

## 2015-07-19 VITALS — BP 133/85 | HR 97 | Temp 98.4°F | Resp 18

## 2015-07-19 DIAGNOSIS — Z452 Encounter for adjustment and management of vascular access device: Secondary | ICD-10-CM | POA: Diagnosis not present

## 2015-07-19 DIAGNOSIS — C189 Malignant neoplasm of colon, unspecified: Secondary | ICD-10-CM

## 2015-07-19 DIAGNOSIS — C787 Secondary malignant neoplasm of liver and intrahepatic bile duct: Principal | ICD-10-CM

## 2015-07-19 MED ORDER — HEPARIN SOD (PORK) LOCK FLUSH 100 UNIT/ML IV SOLN
500.0000 [IU] | Freq: Once | INTRAVENOUS | Status: AC | PRN
  Administered 2015-07-19: 500 [IU]
  Filled 2015-07-19: qty 5

## 2015-07-19 MED ORDER — SODIUM CHLORIDE 0.9 % IJ SOLN
10.0000 mL | INTRAMUSCULAR | Status: DC | PRN
  Administered 2015-07-19: 10 mL
  Filled 2015-07-19: qty 10

## 2015-07-30 ENCOUNTER — Ambulatory Visit: Payer: BLUE CROSS/BLUE SHIELD | Admitting: Nurse Practitioner

## 2015-07-30 ENCOUNTER — Other Ambulatory Visit: Payer: BLUE CROSS/BLUE SHIELD

## 2015-08-07 ENCOUNTER — Ambulatory Visit (HOSPITAL_BASED_OUTPATIENT_CLINIC_OR_DEPARTMENT_OTHER): Payer: BLUE CROSS/BLUE SHIELD

## 2015-08-07 ENCOUNTER — Encounter: Payer: Self-pay | Admitting: Nurse Practitioner

## 2015-08-07 ENCOUNTER — Other Ambulatory Visit: Payer: Self-pay | Admitting: *Deleted

## 2015-08-07 ENCOUNTER — Ambulatory Visit (HOSPITAL_BASED_OUTPATIENT_CLINIC_OR_DEPARTMENT_OTHER): Payer: BLUE CROSS/BLUE SHIELD | Admitting: Nurse Practitioner

## 2015-08-07 ENCOUNTER — Ambulatory Visit: Payer: BLUE CROSS/BLUE SHIELD

## 2015-08-07 ENCOUNTER — Other Ambulatory Visit (HOSPITAL_BASED_OUTPATIENT_CLINIC_OR_DEPARTMENT_OTHER): Payer: BLUE CROSS/BLUE SHIELD

## 2015-08-07 VITALS — BP 153/86 | HR 81 | Temp 98.0°F | Resp 18 | Wt 161.6 lb

## 2015-08-07 DIAGNOSIS — C19 Malignant neoplasm of rectosigmoid junction: Secondary | ICD-10-CM

## 2015-08-07 DIAGNOSIS — E46 Unspecified protein-calorie malnutrition: Secondary | ICD-10-CM

## 2015-08-07 DIAGNOSIS — C787 Secondary malignant neoplasm of liver and intrahepatic bile duct: Secondary | ICD-10-CM

## 2015-08-07 DIAGNOSIS — D500 Iron deficiency anemia secondary to blood loss (chronic): Secondary | ICD-10-CM | POA: Diagnosis not present

## 2015-08-07 DIAGNOSIS — C189 Malignant neoplasm of colon, unspecified: Secondary | ICD-10-CM

## 2015-08-07 DIAGNOSIS — D701 Agranulocytosis secondary to cancer chemotherapy: Secondary | ICD-10-CM

## 2015-08-07 DIAGNOSIS — R1011 Right upper quadrant pain: Secondary | ICD-10-CM

## 2015-08-07 DIAGNOSIS — R634 Abnormal weight loss: Secondary | ICD-10-CM

## 2015-08-07 DIAGNOSIS — T451X5A Adverse effect of antineoplastic and immunosuppressive drugs, initial encounter: Secondary | ICD-10-CM

## 2015-08-07 LAB — FERRITIN: FERRITIN: 120 ng/mL (ref 9–269)

## 2015-08-07 LAB — IRON AND TIBC
%SAT: 17 % — AB (ref 21–57)
Iron: 55 ug/dL (ref 41–142)
TIBC: 335 ug/dL (ref 236–444)
UIBC: 280 ug/dL (ref 120–384)

## 2015-08-07 LAB — CBC WITH DIFFERENTIAL/PLATELET
BASO%: 0.2 % (ref 0.0–2.0)
BASOS ABS: 0 10*3/uL (ref 0.0–0.1)
EOS ABS: 0.5 10*3/uL (ref 0.0–0.5)
EOS%: 12.6 % — AB (ref 0.0–7.0)
HEMATOCRIT: 39.9 % (ref 34.8–46.6)
HEMOGLOBIN: 12.2 g/dL (ref 11.6–15.9)
LYMPH%: 44.1 % (ref 14.0–49.7)
MCH: 23.3 pg — AB (ref 25.1–34.0)
MCHC: 30.6 g/dL — AB (ref 31.5–36.0)
MCV: 76.1 fL — AB (ref 79.5–101.0)
MONO#: 0.8 10*3/uL (ref 0.1–0.9)
MONO%: 19 % — AB (ref 0.0–14.0)
NEUT#: 1 10*3/uL — ABNORMAL LOW (ref 1.5–6.5)
NEUT%: 24.1 % — AB (ref 38.4–76.8)
Platelets: 336 10*3/uL (ref 145–400)
RBC: 5.24 10*6/uL (ref 3.70–5.45)
RDW: 20.2 % — ABNORMAL HIGH (ref 11.2–14.5)
WBC: 4.1 10*3/uL (ref 3.9–10.3)
lymph#: 1.8 10*3/uL (ref 0.9–3.3)

## 2015-08-07 LAB — COMPREHENSIVE METABOLIC PANEL
ALT: 9 U/L (ref 0–55)
AST: 21 U/L (ref 5–34)
Albumin: 3.6 g/dL (ref 3.5–5.0)
Alkaline Phosphatase: 92 U/L (ref 40–150)
Anion Gap: 7 mEq/L (ref 3–11)
BUN: 11.2 mg/dL (ref 7.0–26.0)
CALCIUM: 9.7 mg/dL (ref 8.4–10.4)
CHLORIDE: 106 meq/L (ref 98–109)
CO2: 28 meq/L (ref 22–29)
Creatinine: 0.9 mg/dL (ref 0.6–1.1)
EGFR: 80 mL/min/{1.73_m2} — ABNORMAL LOW (ref >90)
Glucose: 85 mg/dl (ref 70–140)
POTASSIUM: 4.1 meq/L (ref 3.5–5.1)
SODIUM: 141 meq/L (ref 136–145)
Total Bilirubin: 0.69 mg/dL (ref 0.20–1.20)
Total Protein: 7.9 g/dL (ref 6.4–8.3)

## 2015-08-07 LAB — UA PROTEIN, DIPSTICK - CHCC: PROTEIN: 30 mg/dL

## 2015-08-07 MED ORDER — OXYCODONE HCL 5 MG PO TABS: 5.0000 mg | ORAL_TABLET | Freq: Four times a day (QID) | ORAL | Status: DC | PRN

## 2015-08-07 MED ORDER — TBO-FILGRASTIM 300 MCG/0.5ML ~~LOC~~ SOSY
300.0000 ug | PREFILLED_SYRINGE | Freq: Once | SUBCUTANEOUS | Status: AC
  Administered 2015-08-07: 300 ug via SUBCUTANEOUS
  Filled 2015-08-07: qty 0.5

## 2015-08-07 MED ORDER — FILGRASTIM 300 MCG/0.5ML IJ SOSY: 300.0000 ug | PREFILLED_SYRINGE | Freq: Once | INTRAMUSCULAR | Status: DC

## 2015-08-07 NOTE — Progress Notes (Signed)
Christina [name]  [phone] [phone]  Clinic follow up Note   Patient Care Team: Christina Mccreedy, [name] as PCP - General (Internal Medicine) Christina Craver, [name] as Consulting Physician (Gastroenterology) Christina Boston, [name] as Consulting Physician (General Surgery) Christina [name] as Consulting Physician (Oncology) 08/07/2015  CHIEF COMPLAINTS:  Follow up metastatic colon cancer      Oncology History   Metastatic colon cancer to liver   Staging form: Colon and Rectum, AJCC 7th Edition     Pathologic stage from 04/08/2015: T3, N0, M1 - Signed by Christina [name] on 04/22/2015  Presented to ER with intractable N/V; intermittent rectal bleeding X 3 months      Metastatic colon cancer to liver ([place])   04/03/2015 Procedure Colonoscopy showed a obstructing sigmoid rectal mass. Biopsy showed adenocarcinoma.   04/06/2015 Tumor Marker CEA=467.3 / CA19.9=1605   04/06/2015 Imaging CT chest, abdomen and pelvis with contrast showed sigmoid colon rectal mass, multiple (4) liver metastasis, with the largest 9.1 x 6.1 cm mass in the left lobe..   04/08/2015 Initial Diagnosis Metastatic colon cancer to liver   04/08/2015 Surgery Laparoscopic sigmoid colectomy, liver biopsy, port cath insertion, by Christina [name]    04/08/2015 Pathology Results Sigmoid colon segmental resection showed adenocarcinoma with mucinous features, pT 3, 15 lymph nodes all negative, surgical margins were negative. Liver biopsy showed metastatic adenocarcinoma.   05/08/2015 -  Chemotherapy FOLFIRI every 2 weeks, Avastin was added on from second cycle      HISTORY OF PRESENTING ILLNESS:  Christina [name] 54 y.o. female with past medical history of endometriosis, uterine fibroids, thalassemia trait, iron deficient anemia, who was recently found to have a sigmoid rectal cancer. I initially saw her in the hospital, she is here for the first follow-up.  She has been having intermittent rectal bleeding for the past 3  months. She thought it was related to her endometriosis, did not seek medical attention. She also has intermittent mild nausea, especially in the morning, and left lower quadrant abdominal pain. She was found to have profound anemia with hemoglobin 6.5 last week, and received blood transfusion. She was referred to gastroenterologist Christina [name] last week, underwent colonoscopy 2 days ago, which showed a obstructing sigmoid rectal mass, per patient, the colonoscopy report is not available today. Biopsy was done, but the pathology report is still pending.  She developed severe nausea, and vomited several times with clear gastric liquid. She called Christina [name] office, and I was told to come to Christina [name] emergency room by on-call physician Christina [name]. She had a CT scan done in the emergency room today.  Her appetite was fairly normal up to 2 days ago before recent hospital admission, when she was found to have a colorectal mass. She lost about 45 pounds in the past few weeks. 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 family history was positive for colon cancer in her maternal cousin. She is married, lives with her husband, no children.  CURRENT THERAPY: mFOLFOX6 and Avastin every 2 weeks, started on 05/08/2015   INTERIM HISTORY Christina [name] returns for follow-up of her metastatic colorectal cancer, accompanied by her husband. She is due for cycle 7 of chemotherapy today. She had an extra week off after cycle 6 for the Christmas holiday. Her break was uneventful. She denies fevers or chills. Her nausea is well managed with zofran and compazine. She is moving her bowels well with miralax. She has intermittent right  upper quadrant abdominal pain and takes oxycodone PRN. Her appetite is stable and she is maintaining her weight. She has mild fatigue but is able to work.   MEDICAL HISTORY:  Past Medical History  Diagnosis Date  . Hypertension   . Endometriosis     . Thalassanemia   . met colon ca to liver dx'd 03/2015    SURGICAL HISTORY: Past Surgical History  Procedure Laterality Date  . Myomectomy      Gyn in [place]  . Diagnostic laparoscopy      Endometriosis  . Colon resection N/A 04/08/2015    Procedure: LAPAROSCOPIC  RESECTION OF PART OF  SIGMOID COLON;  Surgeon: Christina Boston, [name];  Location: [facility];  Service: General;  Laterality: N/A;  . Liver biopsy N/A 04/08/2015    Procedure: CORE NEEDLE LIVER BIOPSY;  Surgeon: Christina Boston, [name];  Location: [facility];  Service: General;  Laterality: N/A;  . Portacath placement N/A 04/08/2015    Procedure: INSERTION PORT-A-CATH;  Surgeon: Christina Boston, [name];  Location: [facility];  Service: General;  Laterality: N/A;  . Laparoscopic sigmoid colectomy  04/08/2015    for colorectal cancer    SOCIAL HISTORY: Social History   Social History  . Marital Status: Married    Spouse Name: N/A  . Number of Children: N/A  . Years of Education: N/A   Occupational History  . Internal Medicine doctor     Works in [place] Topics  . Smoking status: Never Smoker   . Smokeless tobacco: Not on file  . Alcohol Use: Yes     Comment: socail drinker   . Drug Use: No  . Sexual Activity: Not on file   Other Topics Concern  . Not on file   Social History Narrative   Married, husband Christina [name] (married X 2 years)   No children   IM Physician in [place]: Family History  Problem Relation Age of Onset  . Anesthesia problems Cousin 62    maternal cousin, colon cancer   . Clotting disorder Maternal Grandmother     ALLERGIES:  is allergic to lactose intolerance (gi); milk-related compounds; and nsaids.  MEDICATIONS:  Current Outpatient Prescriptions  Medication Sig Dispense Refill  . dexamethasone (DECADRON) 4 MG tablet Take 1 tablet (4 mg total) by mouth 2 (two) times daily with a meal. 10 tablet 1  . lidocaine-prilocaine (EMLA) cream Apply 1 application  topically as needed. 30 g 6  . NIFEdipine (PROCARDIA-XL/ADALAT CC) 30 MG 24 hr tablet Take 30 mg by mouth daily.    . ondansetron (ZOFRAN) 4 MG tablet Take 1 tablet (4 mg total) by mouth every 6 (six) hours as needed for nausea. 20 tablet 0  . potassium chloride SA (K-DUR,KLOR-CON) 20 MEQ tablet Take 1 tablet (20 mEq total) by mouth 2 (two) times daily. 20 tablet 1  . prochlorperazine (COMPAZINE) 10 MG tablet Take 1 tablet (10 mg total) by mouth every 6 (six) hours as needed for nausea or vomiting. 30 tablet 1  . oxyCODONE (OXY IR/ROXICODONE) 5 MG immediate release tablet Take 1 tablet (5 mg total) by mouth every 6 (six) hours as needed for moderate pain. 40 tablet 0   No current facility-administered medications for this visit.    REVIEW OF SYSTEMS:   Constitutional: Denies fevers, chills or abnormal night sweats, (+) fatigue Eyes: Denies blurriness of vision, double vision or watery eyes Ears, nose, mouth, throat, and face: Denies mucositis or sore  throat Respiratory: Denies cough, dyspnea or wheezes Cardiovascular: Denies palpitation, chest discomfort or lower extremity swelling Gastrointestinal:  (+) nausea, (+) RUQ and rectal pain, and mild constipation Skin: Denies abnormal skin rashes Lymphatics: Denies new lymphadenopathy or easy bruising Neurological:Denies numbness, tingling or new weaknesses Behavioral/Psych: Mood is stable, no new changes  All other systems were reviewed with the patient and are negative.  PHYSICAL EXAMINATION: ECOG PERFORMANCE STATUS: 1 - Symptomatic but completely ambulatory  Filed Vitals:   08/07/15 1146  BP: 153/86  Pulse: 81  Temp: 98 F (36.7 C)  Resp: 18   Filed Weights   08/07/15 1146  Weight: 161 lb 9.6 oz (73.301 kg)    GENERAL:alert, no distress and comfortable SKIN: skin color, texture, turgor are normal, no rashes or significant lesions EYES: normal, conjunctiva are pink and non-injected, sclera clear OROPHARYNX:no exudate, no  erythema and lips, buccal mucosa, and tongue normal  NECK: supple, thyroid normal size, non-tender, without nodularity LYMPH:  no palpable lymphadenopathy in the cervical, axillary or inguinal LUNGS: clear to auscultation and percussion with normal breathing effort HEART: regular rate & rhythm and no murmurs and no lower extremity edema ABDOMEN:abdomen soft, non-tender and normal bowel sounds Musculoskeletal:no cyanosis of digits and no clubbing  PSYCH: alert & oriented x 3 with fluent speech NEURO: no focal motor/sensory deficits  LABORATORY DATA:  I have reviewed the data as listed CBC Latest Ref Rng 08/07/2015 07/17/2015 07/02/2015  WBC 3.9 - 10.3 10e3/uL 4.1 4.8 5.7  Hemoglobin 11.6 - 15.9 g/dL 12.2 11.9 11.7  Hematocrit 34.8 - 46.6 % 39.9 38.7 37.9  Platelets 145 - 400 10e3/uL 336 354 375    CMP Latest Ref Rng 08/07/2015 07/17/2015 07/02/2015  Glucose 70 - 140 mg/dl 85 98 105  BUN 7.0 - 26.0 mg/dL 11.2 7.9 8.1  Creatinine 0.6 - 1.1 mg/dL 0.9 0.9 1.0  Sodium 136 - 145 mEq/L 141 142 142  Potassium 3.5 - 5.1 mEq/L 4.1 3.3(L) 3.7  Chloride 101 - 111 mmol/L - - -  CO2 22 - 29 mEq/L 28 25 28   Calcium 8.4 - 10.4 mg/dL 9.7 10.1 9.8  Total Protein 6.4 - 8.3 g/dL 7.9 8.1 7.5  Total Bilirubin 0.20 - 1.20 mg/dL 0.69 0.60 0.60  Alkaline Phos 40 - 150 U/L 92 91 82  AST 5 - 34 U/L 21 14 15   ALT 0 - 55 U/L 9 <9 <9   CEA  Status: Finalresult Visible to patient:  Not Released Nextappt: 08/07/2015 at 11:15 AM in Oncology ([facility]) Dx:  Metastatic colon cancer to liver ([place])              Ref Range 3d ago (07/17/15) 40moago (06/19/15) 131mogo (05/22/15) 67m30moo (04/22/15)    CEA 0.0 - 5.0 ng/mL 477.5 (H) 457.8 (H)CM 582.5 (H)CM 299.4 (H)CM         Pathology report Diagnosis 04/08/2015 1. Liver, needle/core biopsy, ? cancer - METASTATIC ADENOCARCINOMA. 2. Colon, segmental resection for tumor, sigmoid colon mass open end proximal - COLONIC ADENOCARCINOMA  WITH MUCINOUS FEATURES EXTENDING INTO PERICOLONIC ADIPOSE TISSUE AND SUBSEROSAL CONNECTIVE TISSUE. - MARGINS NOT INVOLVED. - FIFTEEN BENIGN LYMPH NODES (0/15). 3. Colon, resection margin (donut), distal anastomic ring - BENIGN COLON. - NO EVIDENCE OF MALIGNANCY.  Microscopic Comment Specimen: Sigmoid colon with liver biopsy and anastomotic rings. Procedure: Segmental resection with liver biopsy. Tumor site: Distal sigmoid. Specimen integrity: Intact. Macroscopic intactness of mesorectum: N/A Macroscopic tumor perforation: No. Invasive tumor: Maximum size: 8 cm  Histologic type(s): Colorectal adenocarcinoma with mucinous features. Histologic grade and differentiation: G2: moderately differentiated/low grade Type of polyp in which invasive carcinoma arose: No residual polyp. Microscopic extension of invasive tumor: Into pericolonic adipose tissue and subserosal connective tissue. Lymph-Vascular invasion: Present. Peri-neural invasion: Present. Tumor deposit(s) (discontinuous extramural extension): No. Resection margins: Proximal margin: Free of tumor. Distal margin: Free of tumor. Circumferential (radial) (posterior ascending, posterior descending; lateral and posterior mid-rectum; and entire lower 1/3 rectum): N/A Mesenteric margin (sigmoid and transverse): Free of tumor. Distance closest margin (if all above margins negative): 2 cm from mesenteric margin. Treatment effect (neo-adjuvant therapy): No. Additional polyp(s): No. Non-neoplastic findings: None. Lymph nodes: number examined - 15; number positive: 0 Pathologic Staging: pT3, pN0, pM1 Ancillary studies: MMR pending. (JDP:gt, 04/10/15) 2. Mismatch Repair (MMR) Protein Immunohistochemistry (IHC) IHC Expression Result: MLH1: Preserved nuclear expression (greater 50% tumor expression) MSH2: Preserved nuclear expression (greater 50% tumor expression) MSH6: Preserved nuclear expression (greater 50% tumor expression) PMS2:  Preserved nuclear expression (greater 50% tumor expression) * Internal control demonstrates intact nuclear expression Interpretation: NORMAL   [employer] test result   RADIOGRAPHIC STUDIES: I have personally reviewed the radiological images as listed and agreed with the findings in the report.  Ct abdomen and pelvis W Contrast 07/14/2015 IMPRESSION: 1. Interval slight improvement in previously demonstrated multifocal hepatic metastatic disease. 2. Interval resection of sigmoid colon mass and anastomosis. There is nonspecific colonic wall thickening proximal to the anastomosis which may be related to interval radiation therapy. Adjacent prominent lymph nodes are also nonspecific and potentially reactive, although small metastases cannot be excluded. PET-CT may be helpful for further evaluation. 3. Similar appearance of multiple pelvic masses, probably all uterine fibroids.   ASSESSMENT & PLAN:  54 year old African-American female, with past medical history of hypertension, thalassemia trait, iron deficient anemia, uterine fibroids, endometriosis, who presents with intermittent rectal bleeding, mild nausea, and mild intermittent left lower quadrant abdominal pain.   1. Sigmoid rectal adenocarcinoma, with liver metastases, pT3N0M1, stage IV, MMR normal, NRAS and BRAF mutation (+)  -I reviewed her surgical pathology findings with her in great details. Her liver biopsy confirmed metastatic adenocarcinoma  From colon cancer. Her primary sigmoid rectal tumor has been completely removed. -We again reviewed her CT findings, it showed at least 4 liver metastatic lesion, with the largest one measuring 9.1 cm in the left lobe. No other distant metastasis -We reviewed the natural history of metastatic colon cancer, and incurable nature at this is stage, giving her large metastatic disease in the liver. We also discussed that if she had excellent response to chemotherapy, I'll present her restaging  CT scan in our GI tumor Board, to see if she is a candidate for liver surgery or liver targeted therapy to maximally controlled her disease. -Her tumor has BRAF mutation, which is a poor prognostic factor  -Her tumor has NRAS mutation, not a candidate for EGFR inhibitor  -Given the MSI-stable, she might not benefit from immunotherapy, except in a clinically trial setting  -I reviewed her restaging CT scan, which showed interval slight improvement in liver, no other new lesions. Her CEA only slightly decreased, overall her response has been limited. Will continue current therapy. She is tolerating chemo well overall.   2. Nausea secondary to chemo  -she will continue using zofran and compazine as needed   2. Iron deficient Anemia -Secondary to GI bleeding and iron deficiency -Ferritin level was 8, consistent with iron deficiency from GI bleeding -She received Feraheme 510 mg twice, iron  level 55 today -Her anemia has been stable, hemoglobin 12.2 today   3. RUQ abdominal pain -much improved  -Likely related to her surgery and liver mets   4. Weight loss and malnutrition -her weight has been stable lately -continue neutritional supplement  -follow up with dietician   5. Neutropenia, secondary to chemo -ANC 1.0 today. Patient case discussed with Dr. Lindi Adie. Suggested delay of treatment by 1 week.   Plan -postpone cycle 7 FOLFIRI and avastin by 1 week. neupogen today.  -follow up with Dr. Burr Medico in 2 weeks   All questions were answered. The patient knows to call the clinic with any problems, questions or concerns. I spent 15 minutes counseling the patient face to face. The total time spent in the appointment was 25 minutes and more than 50% was on counseling.     Christina Panda, NP 08/07/2015

## 2015-08-08 ENCOUNTER — Telehealth: Payer: Self-pay | Admitting: Hematology

## 2015-08-08 NOTE — Telephone Encounter (Signed)
Left a message with 08/14/15 appointments,ok per dr Burr Medico

## 2015-08-14 ENCOUNTER — Other Ambulatory Visit (HOSPITAL_BASED_OUTPATIENT_CLINIC_OR_DEPARTMENT_OTHER): Payer: BLUE CROSS/BLUE SHIELD

## 2015-08-14 ENCOUNTER — Ambulatory Visit (HOSPITAL_BASED_OUTPATIENT_CLINIC_OR_DEPARTMENT_OTHER): Payer: BLUE CROSS/BLUE SHIELD

## 2015-08-14 ENCOUNTER — Ambulatory Visit (HOSPITAL_BASED_OUTPATIENT_CLINIC_OR_DEPARTMENT_OTHER): Payer: BLUE CROSS/BLUE SHIELD | Admitting: Hematology

## 2015-08-14 ENCOUNTER — Telehealth: Payer: Self-pay | Admitting: *Deleted

## 2015-08-14 ENCOUNTER — Encounter: Payer: Self-pay | Admitting: Hematology

## 2015-08-14 ENCOUNTER — Other Ambulatory Visit: Payer: BLUE CROSS/BLUE SHIELD

## 2015-08-14 ENCOUNTER — Telehealth: Payer: Self-pay | Admitting: Hematology

## 2015-08-14 VITALS — BP 149/89 | HR 83 | Temp 98.5°F | Resp 17 | Ht 66.0 in | Wt 162.2 lb

## 2015-08-14 VITALS — BP 143/80 | HR 74

## 2015-08-14 DIAGNOSIS — D509 Iron deficiency anemia, unspecified: Secondary | ICD-10-CM

## 2015-08-14 DIAGNOSIS — C787 Secondary malignant neoplasm of liver and intrahepatic bile duct: Secondary | ICD-10-CM

## 2015-08-14 DIAGNOSIS — Z5111 Encounter for antineoplastic chemotherapy: Secondary | ICD-10-CM

## 2015-08-14 DIAGNOSIS — E876 Hypokalemia: Secondary | ICD-10-CM

## 2015-08-14 DIAGNOSIS — R110 Nausea: Secondary | ICD-10-CM | POA: Diagnosis not present

## 2015-08-14 DIAGNOSIS — C19 Malignant neoplasm of rectosigmoid junction: Secondary | ICD-10-CM

## 2015-08-14 DIAGNOSIS — R1011 Right upper quadrant pain: Secondary | ICD-10-CM

## 2015-08-14 DIAGNOSIS — T451X5A Adverse effect of antineoplastic and immunosuppressive drugs, initial encounter: Secondary | ICD-10-CM

## 2015-08-14 DIAGNOSIS — D701 Agranulocytosis secondary to cancer chemotherapy: Secondary | ICD-10-CM

## 2015-08-14 DIAGNOSIS — Z5112 Encounter for antineoplastic immunotherapy: Secondary | ICD-10-CM

## 2015-08-14 DIAGNOSIS — C189 Malignant neoplasm of colon, unspecified: Secondary | ICD-10-CM

## 2015-08-14 LAB — CBC WITH DIFFERENTIAL/PLATELET
BASO%: 0.3 % (ref 0.0–2.0)
BASOS ABS: 0 10*3/uL (ref 0.0–0.1)
EOS%: 5.1 % (ref 0.0–7.0)
Eosinophils Absolute: 0.3 10*3/uL (ref 0.0–0.5)
HEMATOCRIT: 40.1 % (ref 34.8–46.6)
HGB: 12.2 g/dL (ref 11.6–15.9)
LYMPH#: 2.3 10*3/uL (ref 0.9–3.3)
LYMPH%: 34.4 % (ref 14.0–49.7)
MCH: 23 pg — AB (ref 25.1–34.0)
MCHC: 30.4 g/dL — AB (ref 31.5–36.0)
MCV: 75.5 fL — ABNORMAL LOW (ref 79.5–101.0)
MONO#: 0.8 10*3/uL (ref 0.1–0.9)
MONO%: 11.4 % (ref 0.0–14.0)
NEUT#: 3.3 10*3/uL (ref 1.5–6.5)
NEUT%: 48.8 % (ref 38.4–76.8)
Platelets: 285 10*3/uL (ref 145–400)
RBC: 5.31 10*6/uL (ref 3.70–5.45)
RDW: 19.2 % — AB (ref 11.2–14.5)
WBC: 6.7 10*3/uL (ref 3.9–10.3)

## 2015-08-14 LAB — COMPREHENSIVE METABOLIC PANEL
ALBUMIN: 3.8 g/dL (ref 3.5–5.0)
ALK PHOS: 93 U/L (ref 40–150)
ALT: 9 U/L (ref 0–55)
AST: 18 U/L (ref 5–34)
Anion Gap: 11 mEq/L (ref 3–11)
BUN: 12.5 mg/dL (ref 7.0–26.0)
CHLORIDE: 103 meq/L (ref 98–109)
CO2: 26 meq/L (ref 22–29)
Calcium: 9.9 mg/dL (ref 8.4–10.4)
Creatinine: 0.9 mg/dL (ref 0.6–1.1)
EGFR: 78 mL/min/{1.73_m2} — AB (ref >90)
GLUCOSE: 85 mg/dL (ref 70–140)
POTASSIUM: 3.5 meq/L (ref 3.5–5.1)
SODIUM: 140 meq/L (ref 136–145)
Total Bilirubin: 0.65 mg/dL (ref 0.20–1.20)
Total Protein: 8.3 g/dL (ref 6.4–8.3)

## 2015-08-14 LAB — CEA: CEA: 442.6 ng/mL — AB (ref 0.0–5.0)

## 2015-08-14 MED ORDER — IRINOTECAN HCL CHEMO INJECTION 100 MG/5ML
180.0000 mg/m2 | Freq: Once | INTRAVENOUS | Status: AC
  Administered 2015-08-14: 330 mg via INTRAVENOUS
  Filled 2015-08-14: qty 16.5

## 2015-08-14 MED ORDER — BEVACIZUMAB CHEMO INJECTION 400 MG/16ML
5.0000 mg/kg | Freq: Once | INTRAVENOUS | Status: AC
  Administered 2015-08-14: 350 mg via INTRAVENOUS
  Filled 2015-08-14: qty 14

## 2015-08-14 MED ORDER — PALONOSETRON HCL INJECTION 0.25 MG/5ML
0.2500 mg | Freq: Once | INTRAVENOUS | Status: AC
  Administered 2015-08-14: 0.25 mg via INTRAVENOUS

## 2015-08-14 MED ORDER — SODIUM CHLORIDE 0.9 % IV SOLN: Freq: Once | INTRAVENOUS | Status: DC

## 2015-08-14 MED ORDER — LEUCOVORIN CALCIUM INJECTION 100 MG
20.0000 mg/m2 | Freq: Once | INTRAMUSCULAR | Status: AC
  Administered 2015-08-14: 36 mg via INTRAVENOUS
  Filled 2015-08-14: qty 1.8

## 2015-08-14 MED ORDER — ATROPINE SULFATE 1 MG/ML IJ SOLN
0.5000 mg | Freq: Once | INTRAMUSCULAR | Status: AC | PRN
  Administered 2015-08-14: 0.5 mg via INTRAVENOUS

## 2015-08-14 MED ORDER — FLUOROURACIL CHEMO INJECTION 5 GM/100ML
2200.0000 mg/m2 | INTRAVENOUS | Status: DC
  Administered 2015-08-14: 4050 mg via INTRAVENOUS
  Filled 2015-08-14: qty 81

## 2015-08-14 MED ORDER — PALONOSETRON HCL INJECTION 0.25 MG/5ML
INTRAVENOUS | Status: AC
  Filled 2015-08-14: qty 5

## 2015-08-14 MED ORDER — SODIUM CHLORIDE 0.9 % IV SOLN
Freq: Once | INTRAVENOUS | Status: AC
  Administered 2015-08-14: 10:00:00 via INTRAVENOUS

## 2015-08-14 MED ORDER — ATROPINE SULFATE 1 MG/ML IJ SOLN
INTRAMUSCULAR | Status: AC
  Filled 2015-08-14: qty 1

## 2015-08-14 MED ORDER — SODIUM CHLORIDE 0.9 % IV SOLN
Freq: Once | INTRAVENOUS | Status: AC
  Administered 2015-08-14: 10:00:00 via INTRAVENOUS
  Filled 2015-08-14: qty 2

## 2015-08-14 MED ORDER — LEUCOVORIN CALCIUM INJECTION 350 MG: 400.0000 mg/m2 | Freq: Once | INTRAVENOUS | Status: DC

## 2015-08-14 NOTE — Telephone Encounter (Signed)
per pof -to sch pt appt-sent Mw email to sch trmt-pt to get updated copy 1/7 d/c appt

## 2015-08-14 NOTE — Progress Notes (Signed)
[place]  [phone] [phone]) [phone]  Clinic follow up Note   Patient Care Team: Benito Mccreedy, MD as PCP - General (Internal Medicine) Juanita Craver, MD as Consulting Physician (Gastroenterology) Michael Boston, MD as Consulting Physician (General Surgery) Truitt Merle, MD as Consulting Physician (Oncology) 08/14/2015  CHIEF COMPLAINTS:  Follow up metastatic colon cancer      Oncology History   Metastatic colon cancer to liver   Staging form: Colon and Rectum, AJCC 7th Edition     Pathologic stage from 04/08/2015: T3, N0, M1 - Signed by Truitt Merle, MD on 04/22/2015  Presented to ER with intractable N/V; intermittent rectal bleeding X 3 months      Metastatic colon cancer to liver ([place])   04/03/2015 Procedure Colonoscopy showed a obstructing sigmoid rectal mass. Biopsy showed adenocarcinoma.   04/06/2015 Tumor Marker CEA=467.3 / CA19.9=1605   04/06/2015 Imaging CT chest, abdomen and pelvis with contrast showed sigmoid colon rectal mass, multiple (4) liver metastasis, with the largest 9.1 x 6.1 cm mass in the left lobe..   04/08/2015 Miscellaneous Foundation one genomic testing showed NRAS G60e and BRAF [id number] mutations   04/08/2015 Initial Diagnosis Metastatic colon cancer to liver   04/08/2015 Surgery Laparoscopic sigmoid colectomy, liver biopsy, port cath insertion, by Dr. Johney Maine    04/08/2015 Pathology Results Sigmoid colon segmental resection showed adenocarcinoma with mucinous features, pT 3, 15 lymph nodes all negative, surgical margins were negative. Liver biopsy showed metastatic adenocarcinoma.   05/08/2015 -  Chemotherapy FOLFIRI every 2 weeks, Avastin was added on from second cycle    07/14/2015 Imaging CT abdomen and pelvis showed interval slight improvement in liver metastasis. No other new lesions.     HISTORY OF PRESENTING ILLNESS:  Christina [name] 55 y.o. female with past medical history of endometriosis, uterine fibroids, thalassemia trait,  iron deficient anemia, who was recently found to have a sigmoid rectal cancer. I initially saw her in the hospital, she is here for the first follow-up.  She has been having intermittent rectal bleeding for the past 3 months. She thought it was related to her endometriosis, did not seek medical attention. She also has intermittent mild nausea, especially in the morning, and left lower quadrant abdominal pain. She was found to have profound anemia with hemoglobin 6.5 last week, and received blood transfusion. She was referred to gastroenterologist Dr. Collene Mares last week, underwent colonoscopy 2 days ago, which showed a obstructing sigmoid rectal mass, per patient, the colonoscopy report is not available today. Biopsy was done, but the pathology report is still pending.  She developed severe nausea, and vomited several times with clear gastric liquid. She called [name], and I was told to come to [facility] emergency room by on-call physician Dr. Carlean Purl. She had a CT scan done in the emergency room today.  Her appetite was fairly normal up to 2 days ago before recent hospital admission, when she was found to have a colorectal mass. She lost about 45 pounds in the past few weeks. 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 family history was positive for colon cancer in her maternal cousin. She is married, lives with her husband, no children.  CURRENT THERAPY: FOLFIRI and Avastin every 2 weeks, started on 05/08/2015, dose reduction and neulasta added from cycle 7  INTERIM HISTORY [name] returns for follow-up and cycle 7 chemo. Her chemotherapy was postponed for 2 weeks due to the holiday and her neutropenia. She did  receive 1 dose of Grant next last week and chemotherapy was postponed to this week. She reports persistent epigastric discomfort and swollen, especially last week around new year, she also had a cold, some neck pain around that time,  which all resolved/improved This week. No fever or chills. She is still working part-time. Her nausea is much better improved with Compazine after last cycle, her appetite and eating are decent. No other new complaints.  MEDICAL HISTORY:  Past Medical History  Diagnosis Date  . Hypertension   . Endometriosis   . Thalassanemia   . met colon ca to liver dx'd 03/2015    SURGICAL HISTORY: Past Surgical History  Procedure Laterality Date  . Myomectomy      Gyn in [place]  . Diagnostic laparoscopy      Endometriosis  . Colon resection N/A 04/08/2015    Procedure: LAPAROSCOPIC  RESECTION OF PART OF  SIGMOID COLON;  Surgeon: Michael Boston, MD;  Location: [facility];  Service: General;  Laterality: N/A;  . Liver biopsy N/A 04/08/2015    Procedure: CORE NEEDLE LIVER BIOPSY;  Surgeon: Michael Boston, MD;  Location: [facility];  Service: General;  Laterality: N/A;  . Portacath placement N/A 04/08/2015    Procedure: INSERTION PORT-A-CATH;  Surgeon: Michael Boston, MD;  Location: [facility];  Service: General;  Laterality: N/A;  . Laparoscopic sigmoid colectomy  04/08/2015    for colorectal cancer    SOCIAL HISTORY: Social History   Social History  . Marital Status: Married    Spouse Name: N/A  . Number of Children: N/A  . Years of Education: N/A   Occupational History  . Internal Medicine doctor     Works in [place] Topics  . Smoking status: Never Smoker   . Smokeless tobacco: Not on file  . Alcohol Use: Yes     Comment: socail drinker   . Drug Use: No  . Sexual Activity: Not on file   Other Topics Concern  . Not on file   Social History Narrative   Married, husband [name] (married X 2 years)   No children   IM Physician in [place]: Family History  Problem Relation Age of Onset  . Anesthesia problems Cousin 34    maternal cousin, colon cancer   . Clotting disorder Maternal Grandmother     ALLERGIES:  is allergic to lactose  intolerance (gi); milk-related compounds; and nsaids.  MEDICATIONS:  Current Outpatient Prescriptions  Medication Sig Dispense Refill  . dexamethasone (DECADRON) 4 MG tablet Take 1 tablet (4 mg total) by mouth 2 (two) times daily with a meal. 10 tablet 1  . lidocaine-prilocaine (EMLA) cream Apply 1 application topically as needed. 30 g 6  . NIFEdipine (PROCARDIA-XL/ADALAT CC) 30 MG 24 hr tablet Take 30 mg by mouth daily.    . ondansetron (ZOFRAN) 4 MG tablet Take 1 tablet (4 mg total) by mouth every 6 (six) hours as needed for nausea. 20 tablet 0  . oxyCODONE (OXY IR/ROXICODONE) 5 MG immediate release tablet Take 1 tablet (5 mg total) by mouth every 6 (six) hours as needed for moderate pain. 40 tablet 0  . potassium chloride SA (K-DUR,KLOR-CON) 20 MEQ tablet Take 1 tablet (20 mEq total) by mouth 2 (two) times daily. 20 tablet 1  . prochlorperazine (COMPAZINE) 10 MG tablet Take 1 tablet (10 mg total) by mouth every 6 (six) hours as needed for nausea or vomiting. 30 tablet 1  No current facility-administered medications for this visit.   Facility-Administered Medications Ordered in Other Visits  Medication Dose Route Frequency Provider Last Rate Last Dose  . 0.9 %  sodium chloride infusion   Intravenous Once Curt Bears, MD        REVIEW OF SYSTEMS:   Constitutional: Denies fevers, chills or abnormal night sweats, (+) fatigue Eyes: Denies blurriness of vision, double vision or watery eyes Ears, nose, mouth, throat, and face: Denies mucositis or sore throat Respiratory: Denies cough, dyspnea or wheezes Cardiovascular: Denies palpitation, chest discomfort or lower extremity swelling Gastrointestinal:  (+) nausea, (+) RUQ and rectal pain, and mild constipation Skin: Denies abnormal skin rashes Lymphatics: Denies new lymphadenopathy or easy bruising Neurological:Denies numbness, tingling or new weaknesses Behavioral/Psych: Mood is stable, no new changes  All other systems were reviewed  with the patient and are negative.  PHYSICAL EXAMINATION: ECOG PERFORMANCE STATUS: 1 - Symptomatic but completely ambulatory  Filed Vitals:   08/14/15 0919  BP: 149/89  Pulse: 83  Temp: 98.5 F (36.9 C)  Resp: 17   Filed Weights   08/14/15 0919  Weight: 162 lb 3.2 oz (73.573 kg)    GENERAL:alert, no distress and comfortable SKIN: skin color, texture, turgor are normal, no rashes or significant lesions EYES: normal, conjunctiva are pink and non-injected, sclera clear OROPHARYNX:no exudate, no erythema and lips, buccal mucosa, and tongue normal  NECK: supple, thyroid normal size, non-tender, without nodularity LYMPH:  no palpable lymphadenopathy in the cervical, axillary or inguinal LUNGS: clear to auscultation and percussion with normal breathing effort HEART: regular rate & rhythm and no murmurs and no lower extremity edema ABDOMEN:abdomen soft, non-tender and normal bowel sounds Musculoskeletal:no cyanosis of digits and no clubbing  PSYCH: alert & oriented x 3 with fluent speech NEURO: no focal motor/sensory deficits  LABORATORY DATA:  I have reviewed the data as listed CBC Latest Ref Rng 08/14/2015 08/07/2015 07/17/2015  WBC 3.9 - 10.3 10e3/uL 6.7 4.1 4.8  Hemoglobin 11.6 - 15.9 g/dL 12.2 12.2 11.9  Hematocrit 34.8 - 46.6 % 40.1 39.9 38.7  Platelets 145 - 400 10e3/uL 285 336 354    CMP Latest Ref Rng 08/14/2015 08/07/2015 07/17/2015  Glucose 70 - 140 mg/dl 85 85 98  BUN 7.0 - 26.0 mg/dL 12.5 11.2 7.9  Creatinine 0.6 - 1.1 mg/dL 0.9 0.9 0.9  Sodium 136 - 145 mEq/L 140 141 142  Potassium 3.5 - 5.1 mEq/L 3.5 4.1 3.3(L)  Chloride 101 - 111 mmol/L - - -  CO2 22 - 29 mEq/L _0 Calcium 8.4 - 10.4 mg/dL 9.9 9.7 10.1  Total Protein 6.4 - 8.3 g/dL 8.3 7.9 8.1  Total Bilirubin 0.20 - 1.20 mg/dL 0.65 0.69 0.60  Alkaline Phos 40 - 150 U/L 93 92 91  AST 5 - 34 U/L _1 ALT 0 - 55 U/L 9 9 <9   CEA  Status: Finalresult Visible to patient:  Not Released  Nextappt: 08/07/2015 at 11:15 AM in Oncology ([facility]) Dx:  Metastatic colon cancer to liver ([place])              Ref Range 3d ago (07/17/15) 34moago (06/19/15) 138mogo (05/22/15) 34m10moo (04/22/15)    CEA 0.0 - 5.0 ng/mL 477.5 (H) 457.8 (H)CM 582.5 (H)CM 299.4 (H)CM        Results for CHAPENELOPI, MIKRUTRN [id number] of 08/14/2015 06:10  Ref. Range 08/07/2015 11:31  Iron Latest Ref Range: 41-142 ug/dL 55  UIBC  Latest Ref Range: 120-384 ug/dL 280  TIBC Latest Ref Range: 236-444 ug/dL 335  %SAT Latest Ref Range: 21-57 % 17 (L)  Ferritin Latest Ref Range: 9-269 ng/ml 120   Pathology report Diagnosis 04/08/2015 1. Liver, needle/core biopsy, ? cancer - METASTATIC ADENOCARCINOMA. 2. Colon, segmental resection for tumor, sigmoid colon mass open end proximal - COLONIC ADENOCARCINOMA WITH MUCINOUS FEATURES EXTENDING INTO PERICOLONIC ADIPOSE TISSUE AND SUBSEROSAL CONNECTIVE TISSUE. - MARGINS NOT INVOLVED. - FIFTEEN BENIGN LYMPH NODES (0/15). 3. Colon, resection margin (donut), distal anastomic ring - BENIGN COLON. - NO EVIDENCE OF MALIGNANCY.  Microscopic Comment Specimen: Sigmoid colon with liver biopsy and anastomotic rings. Procedure: Segmental resection with liver biopsy. Tumor site: Distal sigmoid. Specimen integrity: Intact. Macroscopic intactness of mesorectum: N/A Macroscopic tumor perforation: No. Invasive tumor: Maximum size: 8 cm Histologic type(s): Colorectal adenocarcinoma with mucinous features. Histologic grade and differentiation: G2: moderately differentiated/low grade Type of polyp in which invasive carcinoma arose: No residual polyp. Microscopic extension of invasive tumor: Into pericolonic adipose tissue and subserosal connective tissue. Lymph-Vascular invasion: Present. Peri-neural invasion: Present. Tumor deposit(s) (discontinuous extramural extension): No. Resection margins: Proximal margin: Free of tumor. Distal margin: Free of  tumor. Circumferential (radial) (posterior ascending, posterior descending; lateral and posterior mid-rectum; and entire lower 1/3 rectum): N/A Mesenteric margin (sigmoid and transverse): Free of tumor. Distance closest margin (if all above margins negative): 2 cm from mesenteric margin. Treatment effect (neo-adjuvant therapy): No. Additional polyp(s): No. Non-neoplastic findings: None. Lymph nodes: number examined - 15; number positive: 0 Pathologic Staging: pT3, pN0, pM1 Ancillary studies: MMR pending. (JDP:gt, 04/10/15) 2. Mismatch Repair (MMR) Protein Immunohistochemistry (IHC) IHC Expression Result: MLH1: Preserved nuclear expression (greater 50% tumor expression) MSH2: Preserved nuclear expression (greater 50% tumor expression) MSH6: Preserved nuclear expression (greater 50% tumor expression) PMS2: Preserved nuclear expression (greater 50% tumor expression) * Internal control demonstrates intact nuclear expression Interpretation: NORMAL   FoundationOne test result   RADIOGRAPHIC STUDIES: I have personally reviewed the radiological images as listed and agreed with the findings in the report.  Ct abdomen and pelvis W Contrast 07/14/2015 IMPRESSION: 1. Interval slight improvement in previously demonstrated multifocal hepatic metastatic disease. 2. Interval resection of sigmoid colon mass and anastomosis. There is nonspecific colonic wall thickening proximal to the anastomosis which may be related to interval radiation therapy. Adjacent prominent lymph nodes are also nonspecific and potentially reactive, although small metastases cannot be excluded. PET-CT may be helpful for further evaluation. 3. Similar appearance of multiple pelvic masses, probably all uterine fibroids.   ASSESSMENT & PLAN:  55 year old African-American female, with past medical history of hypertension, thalassemia trait, iron deficient anemia, uterine fibroids, endometriosis, who presents with intermittent  rectal bleeding, mild nausea, and mild intermittent left lower quadrant abdominal pain.   1. Sigmoid rectal adenocarcinoma, with liver metastases, pT3N0M1, stage IV, MMR normal, NRAS and BRAF mutation (+)  -I reviewed her surgical pathology findings with her in great details. Her liver biopsy confirmed metastatic adenocarcinoma  From colon cancer. Her primary sigmoid rectal tumor has been completely removed. -We again reviewed her CT findings, it showed at least 4 liver metastatic lesion, with the largest one measuring 9.1 cm in the left lobe. No other distant metastasis -We reviewed the natural history of metastatic colon cancer, and incurable nature at this is stage, giving her large metastatic disease in the liver. We also discussed that if she had excellent response to chemotherapy, I'll present her restaging CT scan in our GI tumor Board, to see if she is a  candidate for liver surgery or liver targeted therapy to maximally controlled her disease. -Her tumor has BRAF mutation, which is a poor prognostic factor  -Her tumor has NRAS mutation, not a candidate for EGFR inhibitor  -Given the MSI-stable, she might not benefit from immunotherapy, except in a clinically trial setting  -I reviewed her restaging CT scan from 07/14/15, which showed interval slight improvement in liver, no other new lesions. Her CEA only slight;y decreased, overall her response has been limited. Will continue current therapy. She is tolerating chemo well overall.  -lab reviewed, adequate for treatment, will proceed cycle 7 today. Due to her prolonged neutropenia, I'll reduce her 5-FU dose slightly and adding Neulasta from cycle 7.   2. Nausea secondary to chemo  -she will continue using zofran and compazine as needed, controlled overall   3. Iron deficient Anemia -Secondary to GI bleeding and iron deficiency -Ferritin level was 8, consistent with iron deficiency from GI bleeding -She received Feraheme 510 mg twice, will  repeat her iron level next month  -Her anemia has been stable, hemoglobin 11.9 today   4 RUQ abdominal pain -much improved, She takes pain medication whatsoever -Likely related to her surgery and liver mets   5. Weight loss and malnutrition -her weight has been stable lately -continue neutritional supplement  -follow up with dietician    Plan -cycle 7 FOLFIRI and avastin today, 5-FU dose reduction to 2200 mg/m, at [facility] on day 3 -She will see APP in 2 weeks and then see me in 4 weeks before chemo -Restaging CT chest and abdomen and pelvis in March 2017    All questions were answered. The patient knows to call the clinic with any problems, questions or concerns. I spent 25 minutes counseling the patient face to face. The total time spent in the appointment was 30 minutes and more than 50% was on counseling.     Truitt Merle, MD 08/14/2015

## 2015-08-14 NOTE — Patient Instructions (Signed)
[place] Discharge Instructions for Patients Receiving Chemotherapy  Today you received the following chemotherapy agents avastin, leucovorin, irinotecan and adrucil.      If you develop nausea and vomiting that is not controlled by your nausea medication, call the clinic.   BELOW ARE SYMPTOMS THAT SHOULD BE REPORTED IMMEDIATELY:  *FEVER GREATER THAN 100.5 F  *CHILLS WITH OR WITHOUT FEVER  NAUSEA AND VOMITING THAT IS NOT CONTROLLED WITH YOUR NAUSEA MEDICATION  *UNUSUAL SHORTNESS OF BREATH  *UNUSUAL BRUISING OR BLEEDING  TENDERNESS IN MOUTH AND THROAT WITH OR WITHOUT PRESENCE OF ULCERS  *URINARY PROBLEMS  *BOWEL PROBLEMS  UNUSUAL RASH Items with * indicate a potential emergency and should be followed up as soon as possible.  Feel free to call the clinic you have any questions or concerns. The clinic phone number is [phone].  Please show the [place] at check-in to the Emergency Department and triage nurse.

## 2015-08-14 NOTE — Telephone Encounter (Signed)
Per staff message and POF I have scheduled appts. Advised scheduler of appts. JMW  

## 2015-08-16 ENCOUNTER — Ambulatory Visit (HOSPITAL_BASED_OUTPATIENT_CLINIC_OR_DEPARTMENT_OTHER): Payer: BLUE CROSS/BLUE SHIELD

## 2015-08-16 VITALS — BP 140/84 | HR 80 | Temp 98.5°F | Resp 18

## 2015-08-16 DIAGNOSIS — Z7189 Other specified counseling: Secondary | ICD-10-CM | POA: Diagnosis not present

## 2015-08-16 DIAGNOSIS — C19 Malignant neoplasm of rectosigmoid junction: Secondary | ICD-10-CM

## 2015-08-16 DIAGNOSIS — C189 Malignant neoplasm of colon, unspecified: Secondary | ICD-10-CM

## 2015-08-16 DIAGNOSIS — C787 Secondary malignant neoplasm of liver and intrahepatic bile duct: Secondary | ICD-10-CM | POA: Diagnosis not present

## 2015-08-16 MED ORDER — HEPARIN SOD (PORK) LOCK FLUSH 100 UNIT/ML IV SOLN
500.0000 [IU] | Freq: Once | INTRAVENOUS | Status: AC | PRN
  Administered 2015-08-16: 500 [IU]
  Filled 2015-08-16: qty 5

## 2015-08-16 MED ORDER — SODIUM CHLORIDE 0.9 % IJ SOLN
10.0000 mL | INTRAMUSCULAR | Status: DC | PRN
  Administered 2015-08-16: 10 mL
  Filled 2015-08-16: qty 10

## 2015-08-16 MED ORDER — PEGFILGRASTIM INJECTION 6 MG/0.6ML ~~LOC~~
6.0000 mg | PREFILLED_SYRINGE | Freq: Once | SUBCUTANEOUS | Status: AC
  Administered 2015-08-16: 6 mg via SUBCUTANEOUS

## 2015-08-16 NOTE — Patient Instructions (Signed)
Pegfilgrastim injection What is this medicine? PEGFILGRASTIM (PEG fil gra stim) is a long-acting granulocyte colony-stimulating factor that stimulates the growth of neutrophils, a type of white blood cell important in the body's fight against infection. It is used to reduce the incidence of fever and infection in patients with certain types of cancer who are receiving chemotherapy that affects the bone marrow, and to increase survival after being exposed to high doses of radiation. This medicine may be used for other purposes; ask your health care provider or pharmacist if you have questions. What should I tell my health care provider before I take this medicine? They need to know if you have any of these conditions: -kidney disease -latex allergy -ongoing radiation therapy -sickle cell disease -skin reactions to acrylic adhesives (On-Body Injector only) -an unusual or allergic reaction to pegfilgrastim, filgrastim, other medicines, foods, dyes, or preservatives -pregnant or trying to get pregnant -breast-feeding How should I use this medicine? This medicine is for injection under the skin. If you get this medicine at home, you will be taught how to prepare and give the pre-filled syringe or how to use the On-body Injector. Refer to the patient Instructions for Use for detailed instructions. Use exactly as directed. Take your medicine at regular intervals. Do not take your medicine more often than directed. It is important that you put your used needles and syringes in a special sharps container. Do not put them in a trash can. If you do not have a sharps container, call your pharmacist or healthcare provider to get one. Talk to your pediatrician regarding the use of this medicine in children. While this drug may be prescribed for selected conditions, precautions do apply. Overdosage: If you think you have taken too much of this medicine contact a poison control center or emergency room at  once. NOTE: This medicine is only for you. Do not share this medicine with others. What if I miss a dose? It is important not to miss your dose. Call your doctor or health care professional if you miss your dose. If you miss a dose due to an On-body Injector failure or leakage, a new dose should be administered as soon as possible using a single prefilled syringe for manual use. What may interact with this medicine? Interactions have not been studied. Give your health care provider a list of all the medicines, herbs, non-prescription drugs, or dietary supplements you use. Also tell them if you smoke, drink alcohol, or use illegal drugs. Some items may interact with your medicine. This list may not describe all possible interactions. Give your health care provider a list of all the medicines, herbs, non-prescription drugs, or dietary supplements you use. Also tell them if you smoke, drink alcohol, or use illegal drugs. Some items may interact with your medicine. What should I watch for while using this medicine? You may need blood work done while you are taking this medicine. If you are going to need a MRI, CT scan, or other procedure, tell your doctor that you are using this medicine (On-Body Injector only). What side effects may I notice from receiving this medicine? Side effects that you should report to your doctor or health care professional as soon as possible: -allergic reactions like skin rash, itching or hives, swelling of the face, lips, or tongue -dizziness -fever -pain, redness, or irritation at site where injected -pinpoint red spots on the skin -red or dark-brown urine -shortness of breath or breathing problems -stomach or side pain, or pain   at the shoulder -swelling -tiredness -trouble passing urine or change in the amount of urine Side effects that usually do not require medical attention (report to your doctor or health care professional if they continue or are  bothersome): -bone pain -muscle pain This list may not describe all possible side effects. Call your doctor for medical advice about side effects. You may report side effects to FDA at 1-800-FDA-1088. Where should I keep my medicine? Keep out of the reach of children. Store pre-filled syringes in a refrigerator between 2 and 8 degrees C (36 and 46 degrees F). Do not freeze. Keep in carton to protect from light. Throw away this medicine if it is left out of the refrigerator for more than 48 hours. Throw away any unused medicine after the expiration date. NOTE: This sheet is a summary. It may not cover all possible information. If you have questions about this medicine, talk to your doctor, pharmacist, or health care provider.    2016, Elsevier/Gold Standard. (2014-08-15 14:30:14)  

## 2015-08-21 ENCOUNTER — Ambulatory Visit: Payer: BLUE CROSS/BLUE SHIELD | Admitting: Hematology

## 2015-08-21 ENCOUNTER — Other Ambulatory Visit: Payer: BLUE CROSS/BLUE SHIELD

## 2015-08-21 ENCOUNTER — Ambulatory Visit: Payer: BLUE CROSS/BLUE SHIELD

## 2015-08-27 ENCOUNTER — Encounter: Payer: Self-pay | Admitting: Pharmacist

## 2015-08-28 ENCOUNTER — Other Ambulatory Visit (HOSPITAL_BASED_OUTPATIENT_CLINIC_OR_DEPARTMENT_OTHER): Payer: BLUE CROSS/BLUE SHIELD

## 2015-08-28 ENCOUNTER — Ambulatory Visit (HOSPITAL_BASED_OUTPATIENT_CLINIC_OR_DEPARTMENT_OTHER): Payer: BLUE CROSS/BLUE SHIELD | Admitting: Nurse Practitioner

## 2015-08-28 ENCOUNTER — Encounter: Payer: Self-pay | Admitting: Nurse Practitioner

## 2015-08-28 ENCOUNTER — Ambulatory Visit (HOSPITAL_BASED_OUTPATIENT_CLINIC_OR_DEPARTMENT_OTHER): Payer: BLUE CROSS/BLUE SHIELD

## 2015-08-28 ENCOUNTER — Other Ambulatory Visit: Payer: Self-pay | Admitting: Hematology

## 2015-08-28 VITALS — BP 142/89 | HR 81 | Temp 99.0°F | Resp 18 | Wt 164.8 lb

## 2015-08-28 DIAGNOSIS — C187 Malignant neoplasm of sigmoid colon: Secondary | ICD-10-CM

## 2015-08-28 DIAGNOSIS — C189 Malignant neoplasm of colon, unspecified: Secondary | ICD-10-CM

## 2015-08-28 DIAGNOSIS — R110 Nausea: Secondary | ICD-10-CM | POA: Diagnosis not present

## 2015-08-28 DIAGNOSIS — C787 Secondary malignant neoplasm of liver and intrahepatic bile duct: Secondary | ICD-10-CM

## 2015-08-28 DIAGNOSIS — D509 Iron deficiency anemia, unspecified: Secondary | ICD-10-CM | POA: Diagnosis not present

## 2015-08-28 DIAGNOSIS — Z5112 Encounter for antineoplastic immunotherapy: Secondary | ICD-10-CM | POA: Diagnosis not present

## 2015-08-28 DIAGNOSIS — Z5111 Encounter for antineoplastic chemotherapy: Secondary | ICD-10-CM

## 2015-08-28 LAB — CBC WITH DIFFERENTIAL/PLATELET
BASO%: 0.4 % (ref 0.0–2.0)
BASOS ABS: 0 10*3/uL (ref 0.0–0.1)
EOS%: 3 % (ref 0.0–7.0)
Eosinophils Absolute: 0.4 10*3/uL (ref 0.0–0.5)
HCT: 38.9 % (ref 34.8–46.6)
HEMOGLOBIN: 11.8 g/dL (ref 11.6–15.9)
LYMPH%: 18.8 % (ref 14.0–49.7)
MCH: 22.6 pg — AB (ref 25.1–34.0)
MCHC: 30.4 g/dL — ABNORMAL LOW (ref 31.5–36.0)
MCV: 74.3 fL — ABNORMAL LOW (ref 79.5–101.0)
MONO#: 0.7 10*3/uL (ref 0.1–0.9)
MONO%: 5.5 % (ref 0.0–14.0)
NEUT#: 9.3 10*3/uL — ABNORMAL HIGH (ref 1.5–6.5)
NEUT%: 72.3 % (ref 38.4–76.8)
Platelets: 393 10*3/uL (ref 145–400)
RBC: 5.24 10*6/uL (ref 3.70–5.45)
RDW: 19.8 % — AB (ref 11.2–14.5)
WBC: 12.9 10*3/uL — AB (ref 3.9–10.3)
lymph#: 2.4 10*3/uL (ref 0.9–3.3)

## 2015-08-28 LAB — COMPREHENSIVE METABOLIC PANEL
ALT: 9 U/L (ref 0–55)
AST: 20 U/L (ref 5–34)
Albumin: 3.9 g/dL (ref 3.5–5.0)
Alkaline Phosphatase: 131 U/L (ref 40–150)
Anion Gap: 9 mEq/L (ref 3–11)
BUN: 16.8 mg/dL (ref 7.0–26.0)
CO2: 26 meq/L (ref 22–29)
Calcium: 9.8 mg/dL (ref 8.4–10.4)
Chloride: 105 mEq/L (ref 98–109)
Creatinine: 1 mg/dL (ref 0.6–1.1)
EGFR: 76 mL/min/{1.73_m2} — AB (ref >90)
GLUCOSE: 90 mg/dL (ref 70–140)
POTASSIUM: 4 meq/L (ref 3.5–5.1)
SODIUM: 140 meq/L (ref 136–145)
TOTAL PROTEIN: 8 g/dL (ref 6.4–8.3)
Total Bilirubin: 0.48 mg/dL (ref 0.20–1.20)

## 2015-08-28 LAB — UA PROTEIN, DIPSTICK - CHCC

## 2015-08-28 MED ORDER — SODIUM CHLORIDE 0.9 % IV SOLN
Freq: Once | INTRAVENOUS | Status: AC
  Administered 2015-08-28: 10:00:00 via INTRAVENOUS

## 2015-08-28 MED ORDER — DEXAMETHASONE SODIUM PHOSPHATE 100 MG/10ML IJ SOLN
Freq: Once | INTRAMUSCULAR | Status: AC
  Administered 2015-08-28: 10:00:00 via INTRAVENOUS
  Filled 2015-08-28: qty 2

## 2015-08-28 MED ORDER — IRINOTECAN HCL CHEMO INJECTION 100 MG/5ML
180.0000 mg/m2 | Freq: Once | INTRAVENOUS | Status: AC
  Administered 2015-08-28: 330 mg via INTRAVENOUS
  Filled 2015-08-28: qty 16.5

## 2015-08-28 MED ORDER — ATROPINE SULFATE 1 MG/ML IJ SOLN
0.5000 mg | Freq: Once | INTRAMUSCULAR | Status: AC | PRN
  Administered 2015-08-28: 0.5 mg via INTRAVENOUS

## 2015-08-28 MED ORDER — SODIUM CHLORIDE 0.9 % IV SOLN
5.0000 mg/kg | Freq: Once | INTRAVENOUS | Status: AC
  Administered 2015-08-28: 350 mg via INTRAVENOUS
  Filled 2015-08-28: qty 14

## 2015-08-28 MED ORDER — PALONOSETRON HCL INJECTION 0.25 MG/5ML
INTRAVENOUS | Status: AC
  Filled 2015-08-28: qty 5

## 2015-08-28 MED ORDER — PALONOSETRON HCL INJECTION 0.25 MG/5ML
0.2500 mg | Freq: Once | INTRAVENOUS | Status: AC
  Administered 2015-08-28: 0.25 mg via INTRAVENOUS

## 2015-08-28 MED ORDER — SODIUM CHLORIDE 0.9 % IV SOLN
2400.0000 mg/m2 | INTRAVENOUS | Status: DC
  Administered 2015-08-28: 4400 mg via INTRAVENOUS
  Filled 2015-08-28: qty 88

## 2015-08-28 MED ORDER — ATROPINE SULFATE 1 MG/ML IJ SOLN
INTRAMUSCULAR | Status: AC
  Filled 2015-08-28: qty 1

## 2015-08-28 MED ORDER — LEUCOVORIN CALCIUM INJECTION 100 MG
20.0000 mg/m2 | Freq: Once | INTRAMUSCULAR | Status: AC
Start: 2015-08-28 — End: 2015-08-28
  Administered 2015-08-28: 36 mg via INTRAVENOUS
  Filled 2015-08-28: qty 1.8

## 2015-08-28 MED ORDER — FLUOROURACIL CHEMO INJECTION 2.5 GM/50ML
400.0000 mg/m2 | Freq: Once | INTRAVENOUS | Status: AC
  Administered 2015-08-28: 750 mg via INTRAVENOUS
  Filled 2015-08-28: qty 15

## 2015-08-28 NOTE — Patient Instructions (Signed)
[facility] Discharge Instructions for Patients Receiving Chemotherapy  Today you received the following chemotherapy agents:  5FU, Avastin, Leucovorin, Irinotecan.  To help prevent nausea and vomiting after your treatment, we encourage you to take your nausea medication.   If you develop nausea and vomiting that is not controlled by your nausea medication, call the clinic.   BELOW ARE SYMPTOMS THAT SHOULD BE REPORTED IMMEDIATELY:  *FEVER GREATER THAN 100.5 F  *CHILLS WITH OR WITHOUT FEVER  NAUSEA AND VOMITING THAT IS NOT CONTROLLED WITH YOUR NAUSEA MEDICATION  *UNUSUAL SHORTNESS OF BREATH  *UNUSUAL BRUISING OR BLEEDING  TENDERNESS IN MOUTH AND THROAT WITH OR WITHOUT PRESENCE OF ULCERS  *URINARY PROBLEMS  *BOWEL PROBLEMS  UNUSUAL RASH Items with * indicate a potential emergency and should be followed up as soon as possible.  Feel free to call the clinic you have any questions or concerns. The clinic phone number is [phone].  Please show the [facility] at check-in to the Emergency Department and triage nurse.

## 2015-08-28 NOTE — Progress Notes (Signed)
[place]  [phone] [phone]  Clinic follow up Note   Patient Care Team: Benito Mccreedy, MD as PCP - General (Internal Medicine) Juanita Craver, MD as Consulting Physician (Gastroenterology) Michael Boston, MD as Consulting Physician (General Surgery) Truitt Merle, MD as Consulting Physician (Oncology) 08/28/2015  CHIEF COMPLAINTS:  Follow up metastatic colon cancer      Oncology History   Metastatic colon cancer to liver   Staging form: Colon and Rectum, AJCC 7th Edition     Pathologic stage from 04/08/2015: T3, N0, M1 - Signed by Truitt Merle, MD on 04/22/2015  Presented to ER with intractable N/V; intermittent rectal bleeding X 3 months      Metastatic colon cancer to liver ([place])   04/03/2015 Procedure Colonoscopy showed a obstructing sigmoid rectal mass. Biopsy showed adenocarcinoma.   04/06/2015 Tumor Marker CEA=467.3 / CA19.9=1605   04/06/2015 Imaging CT chest, abdomen and pelvis with contrast showed sigmoid colon rectal mass, multiple (4) liver metastasis, with the largest 9.1 x 6.1 cm mass in the left lobe..   04/08/2015 Miscellaneous Foundation one genomic testing showed NRAS G60e and BRAF [id number] mutations   04/08/2015 Initial Diagnosis Metastatic colon cancer to liver   04/08/2015 Surgery Laparoscopic sigmoid colectomy, liver biopsy, port cath insertion, by Dr. Johney Maine    04/08/2015 Pathology Results Sigmoid colon segmental resection showed adenocarcinoma with mucinous features, pT 3, 15 lymph nodes all negative, surgical margins were negative. Liver biopsy showed metastatic adenocarcinoma.   05/08/2015 -  Chemotherapy FOLFIRI every 2 weeks, Avastin was added on from second cycle    07/14/2015 Imaging CT abdomen and pelvis showed interval slight improvement in liver metastasis. No other new lesions.     HISTORY OF PRESENTING ILLNESS:  Christina [name] 55 y.o. female with past medical history of endometriosis, uterine fibroids, thalassemia trait,  iron deficient anemia, who was recently found to have a sigmoid rectal cancer. I initially saw her in the hospital, she is here for the first follow-up.  She has been having intermittent rectal bleeding for the past 3 months. She thought it was related to her endometriosis, did not seek medical attention. She also has intermittent mild nausea, especially in the morning, and left lower quadrant abdominal pain. She was found to have profound anemia with hemoglobin 6.5 last week, and received blood transfusion. She was referred to gastroenterologist Dr. Collene Mares last week, underwent colonoscopy 2 days ago, which showed a obstructing sigmoid rectal mass, per patient, the colonoscopy report is not available today. Biopsy was done, but the pathology report is still pending.  She developed severe nausea, and vomited several times with clear gastric liquid. She called [name], and I was told to come to [facility] emergency room by on-call physician Dr. Carlean Purl. She had a CT scan done in the emergency room today.  Her appetite was fairly normal up to 2 days ago before recent hospital admission, when she was found to have a colorectal mass. She lost about 45 pounds in the past few weeks. 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 family history was positive for colon cancer in her maternal cousin. She is married, lives with her husband, no children.  CURRENT THERAPY: FOLFIRI and Avastin every 2 weeks, started on 05/08/2015, dose reduction and neulasta added from cycle 7  INTERIM HISTORY [name] returns for follow-up of her metastatic colon cancer. She is due for cycle 8 of FOLFIRI. She tolerated her last cycle remarkably well,  and notes an uptick in her energy level. She has been able to work longer hours and stay on her feet for longer. She denies fevers or chills. Her nausea is minimal. She is moving her bowels well. She has persistent epigastric pain,  but this has not been limiting her physical activity. She has been able to avoid regular use of narcotics. Her appetite is very good. She denies mouth sores, rashes, or neuropathy symptoms. Her fingertips are only sensitive during the infusion time itself.   MEDICAL HISTORY:  Past Medical History  Diagnosis Date  . Hypertension   . Endometriosis   . Thalassanemia   . met colon ca to liver dx'd 03/2015    SURGICAL HISTORY: Past Surgical History  Procedure Laterality Date  . Myomectomy      Gyn in [place]  . Diagnostic laparoscopy      Endometriosis  . Colon resection N/A 04/08/2015    Procedure: LAPAROSCOPIC  RESECTION OF PART OF  SIGMOID COLON;  Surgeon: Michael Boston, MD;  Location: [facility];  Service: General;  Laterality: N/A;  . Liver biopsy N/A 04/08/2015    Procedure: CORE NEEDLE LIVER BIOPSY;  Surgeon: Michael Boston, MD;  Location: [facility];  Service: General;  Laterality: N/A;  . Portacath placement N/A 04/08/2015    Procedure: INSERTION PORT-A-CATH;  Surgeon: Michael Boston, MD;  Location: [facility];  Service: General;  Laterality: N/A;  . Laparoscopic sigmoid colectomy  04/08/2015    for colorectal cancer    SOCIAL HISTORY: Social History   Social History  . Marital Status: Married    Spouse Name: N/A  . Number of Children: N/A  . Years of Education: N/A   Occupational History  . Internal Medicine doctor     Works in [place] Topics  . Smoking status: Never Smoker   . Smokeless tobacco: Not on file  . Alcohol Use: Yes     Comment: socail drinker   . Drug Use: No  . Sexual Activity: Not on file   Other Topics Concern  . Not on file   Social History Narrative   Married, husband [name] (married X 2 years)   No children   IM Physician in [place]: Family History  Problem Relation Age of Onset  . Anesthesia problems Cousin 64    maternal cousin, colon cancer   . Clotting disorder Maternal Grandmother      ALLERGIES:  is allergic to lactose intolerance (gi); milk-related compounds; and nsaids.  MEDICATIONS:  Current Outpatient Prescriptions  Medication Sig Dispense Refill  . dexamethasone (DECADRON) 4 MG tablet Take 1 tablet (4 mg total) by mouth 2 (two) times daily with a meal. 10 tablet 1  . lidocaine-prilocaine (EMLA) cream Apply 1 application topically as needed. 30 g 6  . NIFEdipine (PROCARDIA-XL/ADALAT CC) 30 MG 24 hr tablet Take 30 mg by mouth daily.    . ondansetron (ZOFRAN) 4 MG tablet Take 1 tablet (4 mg total) by mouth every 6 (six) hours as needed for nausea. 20 tablet 0  . oxyCODONE (OXY IR/ROXICODONE) 5 MG immediate release tablet Take 1 tablet (5 mg total) by mouth every 6 (six) hours as needed for moderate pain. 40 tablet 0  . potassium chloride SA (K-DUR,KLOR-CON) 20 MEQ tablet Take 1 tablet (20 mEq total) by mouth 2 (two) times daily. 20 tablet 1  . prochlorperazine (COMPAZINE) 10 MG tablet Take 1 tablet (10 mg total) by mouth every 6 (six)  hours as needed for nausea or vomiting. (Patient not taking: Reported on 08/28/2015) 30 tablet 1   No current facility-administered medications for this visit.   Facility-Administered Medications Ordered in Other Visits  Medication Dose Route Frequency Provider Last Rate Last Dose  . atropine injection 0.5 mg  0.5 mg Intravenous Once PRN Truitt Merle, MD      . bevacizumab (AVASTIN) 350 mg in sodium chloride 0.9 % 100 mL chemo infusion  5 mg/kg (Treatment Plan Actual) Intravenous Once Truitt Merle, MD      . dexamethasone (DECADRON) 20 mg in sodium chloride 0.9 % 50 mL IVPB   Intravenous Once Truitt Merle, MD      . fluorouracil (ADRUCIL) 4,400 mg in sodium chloride 0.9 % 62 mL chemo infusion  2,400 mg/m2 (Treatment Plan Actual) Intravenous 1 day or 1 dose Truitt Merle, MD      . fluorouracil (ADRUCIL) chemo injection 750 mg  400 mg/m2 (Treatment Plan Actual) Intravenous Once Truitt Merle, MD      . irinotecan (CAMPTOSAR) 330 mg in dextrose 5 % 500 mL chemo  infusion  180 mg/m2 (Treatment Plan Actual) Intravenous Once Truitt Merle, MD      . leucovorin injection 36 mg  20 mg/m2 (Treatment Plan Actual) Intravenous Once Truitt Merle, MD        REVIEW OF SYSTEMS:   Constitutional: Denies fevers, chills or abnormal night sweats, (+) fatigue Eyes: Denies blurriness of vision, double vision or watery eyes Ears, nose, mouth, throat, and face: Denies mucositis or sore throat Respiratory: Denies cough, dyspnea or wheezes Cardiovascular: Denies palpitation, chest discomfort or lower extremity swelling Gastrointestinal:  (+) nausea, (+) RUQ and rectal pain, and mild constipation Skin: Denies abnormal skin rashes Lymphatics: Denies new lymphadenopathy or easy bruising Neurological:Denies numbness, tingling or new weaknesses Behavioral/Psych: Mood is stable, no new changes  All other systems were reviewed with the patient and are negative.  PHYSICAL EXAMINATION: ECOG PERFORMANCE STATUS: 1 - Symptomatic but completely ambulatory  Filed Vitals:   08/28/15 0848  BP: 142/89  Pulse: 81  Temp: 99 F (37.2 C)  Resp: 18   Filed Weights   08/28/15 0848  Weight: 164 lb 12.8 oz (74.753 kg)    GENERAL:alert, no distress and comfortable SKIN: skin color, texture, turgor are normal, no rashes or significant lesions EYES: normal, conjunctiva are pink and non-injected, sclera clear OROPHARYNX:no exudate, no erythema and lips, buccal mucosa, and tongue normal  NECK: supple, thyroid normal size, non-tender, without nodularity LYMPH:  no palpable lymphadenopathy in the cervical, axillary or inguinal LUNGS: clear to auscultation and percussion with normal breathing effort HEART: regular rate & rhythm and no murmurs and no lower extremity edema ABDOMEN:abdomen soft, non-tender and normal bowel sounds Musculoskeletal:no cyanosis of digits and no clubbing  PSYCH: alert & oriented x 3 with fluent speech NEURO: no focal motor/sensory deficits  LABORATORY DATA:  I have  reviewed the data as listed CBC Latest Ref Rng 08/28/2015 08/14/2015 08/07/2015  WBC 3.9 - 10.3 10e3/uL 12.9(H) 6.7 4.1  Hemoglobin 11.6 - 15.9 g/dL 11.8 12.2 12.2  Hematocrit 34.8 - 46.6 % 38.9 40.1 39.9  Platelets 145 - 400 10e3/uL 393 285 336   Results for KEITA, DEMARCO (MRN [id number]) as of 08/28/2015 10:07  Ref. Range 05/22/2015 10:24 06/19/2015 11:42 07/17/2015 11:14 08/14/2015 08:41  CEA Latest Ref Range: 0.0-5.0 ng/mL 582.5 (H) 457.8 (H) 477.5 (H) 442.6 (H)    Results for DAYLEEN, BESKE (MRN [id number]) as of 08/14/2015 06:10  Ref. Range 08/07/2015  11:31  Iron Latest Ref Range: 41-142 ug/dL 55  UIBC Latest Ref Range: 120-384 ug/dL 280  TIBC Latest Ref Range: 236-444 ug/dL 335  %SAT Latest Ref Range: 21-57 % 17 (L)  Ferritin Latest Ref Range: 9-269 ng/ml 120   Pathology report Diagnosis 04/08/2015 1. Liver, needle/core biopsy, ? cancer - METASTATIC ADENOCARCINOMA. 2. Colon, segmental resection for tumor, sigmoid colon mass open end proximal - COLONIC ADENOCARCINOMA WITH MUCINOUS FEATURES EXTENDING INTO PERICOLONIC ADIPOSE TISSUE AND SUBSEROSAL CONNECTIVE TISSUE. - MARGINS NOT INVOLVED. - FIFTEEN BENIGN LYMPH NODES (0/15). 3. Colon, resection margin (donut), distal anastomic ring - BENIGN COLON. - NO EVIDENCE OF MALIGNANCY.  Microscopic Comment Specimen: Sigmoid colon with liver biopsy and anastomotic rings. Procedure: Segmental resection with liver biopsy. Tumor site: Distal sigmoid. Specimen integrity: Intact. Macroscopic intactness of mesorectum: N/A Macroscopic tumor perforation: No. Invasive tumor: Maximum size: 8 cm Histologic type(s): Colorectal adenocarcinoma with mucinous features. Histologic grade and differentiation: G2: moderately differentiated/low grade Type of polyp in which invasive carcinoma arose: No residual polyp. Microscopic extension of invasive tumor: Into pericolonic adipose tissue and subserosal connective tissue. Lymph-Vascular invasion:  Present. Peri-neural invasion: Present. Tumor deposit(s) (discontinuous extramural extension): No. Resection margins: Proximal margin: Free of tumor. Distal margin: Free of tumor. Circumferential (radial) (posterior ascending, posterior descending; lateral and posterior mid-rectum; and entire lower 1/3 rectum): N/A Mesenteric margin (sigmoid and transverse): Free of tumor. Distance closest margin (if all above margins negative): 2 cm from mesenteric margin. Treatment effect (neo-adjuvant therapy): No. Additional polyp(s): No. Non-neoplastic findings: None. Lymph nodes: number examined - 15; number positive: 0 Pathologic Staging: pT3, pN0, pM1 Ancillary studies: MMR pending. (JDP:gt, 04/10/15) 2. Mismatch Repair (MMR) Protein Immunohistochemistry (IHC) IHC Expression Result: MLH1: Preserved nuclear expression (greater 50% tumor expression) MSH2: Preserved nuclear expression (greater 50% tumor expression) MSH6: Preserved nuclear expression (greater 50% tumor expression) PMS2: Preserved nuclear expression (greater 50% tumor expression) * Internal control demonstrates intact nuclear expression Interpretation: NORMAL   FoundationOne test result   RADIOGRAPHIC STUDIES: I have personally reviewed the radiological images as listed and agreed with the findings in the report.  Ct abdomen and pelvis W Contrast 07/14/2015 IMPRESSION: 1. Interval slight improvement in previously demonstrated multifocal hepatic metastatic disease. 2. Interval resection of sigmoid colon mass and anastomosis. There is nonspecific colonic wall thickening proximal to the anastomosis which may be related to interval radiation therapy. Adjacent prominent lymph nodes are also nonspecific and potentially reactive, although small metastases cannot be excluded. PET-CT may be helpful for further evaluation. 3. Similar appearance of multiple pelvic masses, probably all uterine fibroids.   ASSESSMENT & PLAN:    55 year old African-American female, with past medical history of hypertension, thalassemia trait, iron deficient anemia, uterine fibroids, endometriosis, who presents with intermittent rectal bleeding, mild nausea, and mild intermittent left lower quadrant abdominal pain.   1. Sigmoid rectal adenocarcinoma, with liver metastases, pT3N0M1, stage IV, MMR normal, NRAS and BRAF mutation (+)  -I reviewed her surgical pathology findings with her in great details. Her liver biopsy confirmed metastatic adenocarcinoma  From colon cancer. Her primary sigmoid rectal tumor has been completely removed. -We again reviewed her CT findings, it showed at least 4 liver metastatic lesion, with the largest one measuring 9.1 cm in the left lobe. No other distant metastasis -We reviewed the natural history of metastatic colon cancer, and incurable nature at this is stage, giving her large metastatic disease in the liver. We also discussed that if she had excellent response to chemotherapy, I'll present her restaging CT  scan in our GI tumor Board, to see if she is a candidate for liver surgery or liver targeted therapy to maximally controlled her disease. -Her tumor has BRAF mutation, which is a poor prognostic factor  -Her tumor has NRAS mutation, not a candidate for EGFR inhibitor  -Given the MSI-stable, she might not benefit from immunotherapy, except in a clinically trial setting  -I reviewed her restaging CT scan from 07/14/15, which showed interval slight improvement in liver, no other new lesions. Her CEA only slight;y decreased, overall her response has been limited. Will continue current therapy. She is tolerating chemo well overall.  -lab reviewed, adequate for treatment, will proceed cycle 8 today with 5-FU dose reduction  2. Nausea secondary to chemo  -she will continue using zofran and compazine as needed, controlled overall   3. Iron deficient Anemia -Secondary to GI bleeding and iron deficiency -Ferritin  level was 8, consistent with iron deficiency from GI bleeding -She received Feraheme 510 mg twice, will repeat her iron level next month  -Her anemia has been stable, hemoglobin 11.8 today   4 RUQ abdominal pain -much improved, She takes no pain medication whatsoever -Likely related to her surgery and liver mets   5. Weight loss and malnutrition -gained 6lb since early December 2016 -continue neutritional supplement   Plan -cycle 8 FOLFIRI and avastin today, 5-FU dose reduction to 2200 mg/m, at [facility] on day 3 -She will return in 2 weeks for follow up with Dr. Burr Medico -Restaging CT chest and abdomen and pelvis in March 2017   All questions were answered. The patient knows to call the clinic with any problems, questions or concerns.  Laurie Panda, NP 08/28/2015

## 2015-08-30 ENCOUNTER — Ambulatory Visit (HOSPITAL_BASED_OUTPATIENT_CLINIC_OR_DEPARTMENT_OTHER): Payer: BLUE CROSS/BLUE SHIELD

## 2015-08-30 VITALS — BP 131/89 | HR 73 | Temp 98.7°F | Resp 18

## 2015-08-30 DIAGNOSIS — C187 Malignant neoplasm of sigmoid colon: Secondary | ICD-10-CM | POA: Diagnosis not present

## 2015-08-30 DIAGNOSIS — C787 Secondary malignant neoplasm of liver and intrahepatic bile duct: Secondary | ICD-10-CM | POA: Diagnosis not present

## 2015-08-30 DIAGNOSIS — C189 Malignant neoplasm of colon, unspecified: Secondary | ICD-10-CM

## 2015-08-30 MED ORDER — PEGFILGRASTIM INJECTION 6 MG/0.6ML ~~LOC~~
6.0000 mg | PREFILLED_SYRINGE | Freq: Once | SUBCUTANEOUS | Status: AC
  Administered 2015-08-30: 6 mg via SUBCUTANEOUS
  Filled 2015-08-30: qty 0.6

## 2015-08-30 MED ORDER — HEPARIN SOD (PORK) LOCK FLUSH 100 UNIT/ML IV SOLN
500.0000 [IU] | Freq: Once | INTRAVENOUS | Status: AC | PRN
  Administered 2015-08-30: 500 [IU]
  Filled 2015-08-30: qty 5

## 2015-08-30 MED ORDER — SODIUM CHLORIDE 0.9 % IJ SOLN
10.0000 mL | INTRAMUSCULAR | Status: DC | PRN
  Administered 2015-08-30: 10 mL
  Filled 2015-08-30: qty 10

## 2015-08-30 NOTE — Patient Instructions (Signed)
Pegfilgrastim injection What is this medicine? PEGFILGRASTIM (PEG fil gra stim) is a long-acting granulocyte colony-stimulating factor that stimulates the growth of neutrophils, a type of white blood cell important in the body's fight against infection. It is used to reduce the incidence of fever and infection in patients with certain types of cancer who are receiving chemotherapy that affects the bone marrow, and to increase survival after being exposed to high doses of radiation. This medicine may be used for other purposes; ask your health care provider or pharmacist if you have questions. What should I tell my health care provider before I take this medicine? They need to know if you have any of these conditions: -kidney disease -latex allergy -ongoing radiation therapy -sickle cell disease -skin reactions to acrylic adhesives (On-Body Injector only) -an unusual or allergic reaction to pegfilgrastim, filgrastim, other medicines, foods, dyes, or preservatives -pregnant or trying to get pregnant -breast-feeding How should I use this medicine? This medicine is for injection under the skin. If you get this medicine at home, you will be taught how to prepare and give the pre-filled syringe or how to use the On-body Injector. Refer to the patient Instructions for Use for detailed instructions. Use exactly as directed. Take your medicine at regular intervals. Do not take your medicine more often than directed. It is important that you put your used needles and syringes in a special sharps container. Do not put them in a trash can. If you do not have a sharps container, call your pharmacist or healthcare provider to get one. Talk to your pediatrician regarding the use of this medicine in children. While this drug may be prescribed for selected conditions, precautions do apply. Overdosage: If you think you have taken too much of this medicine contact a poison control center or emergency room at  once. NOTE: This medicine is only for you. Do not share this medicine with others. What if I miss a dose? It is important not to miss your dose. Call your doctor or health care professional if you miss your dose. If you miss a dose due to an On-body Injector failure or leakage, a new dose should be administered as soon as possible using a single prefilled syringe for manual use. What may interact with this medicine? Interactions have not been studied. Give your health care provider a list of all the medicines, herbs, non-prescription drugs, or dietary supplements you use. Also tell them if you smoke, drink alcohol, or use illegal drugs. Some items may interact with your medicine. This list may not describe all possible interactions. Give your health care provider a list of all the medicines, herbs, non-prescription drugs, or dietary supplements you use. Also tell them if you smoke, drink alcohol, or use illegal drugs. Some items may interact with your medicine. What should I watch for while using this medicine? You may need blood work done while you are taking this medicine. If you are going to need a MRI, CT scan, or other procedure, tell your doctor that you are using this medicine (On-Body Injector only). What side effects may I notice from receiving this medicine? Side effects that you should report to your doctor or health care professional as soon as possible: -allergic reactions like skin rash, itching or hives, swelling of the face, lips, or tongue -dizziness -fever -pain, redness, or irritation at site where injected -pinpoint red spots on the skin -red or dark-brown urine -shortness of breath or breathing problems -stomach or side pain, or pain   at the shoulder -swelling -tiredness -trouble passing urine or change in the amount of urine Side effects that usually do not require medical attention (report to your doctor or health care professional if they continue or are  bothersome): -bone pain -muscle pain This list may not describe all possible side effects. Call your doctor for medical advice about side effects. You may report side effects to FDA at 1-800-FDA-1088. Where should I keep my medicine? Keep out of the reach of children. Store pre-filled syringes in a refrigerator between 2 and 8 degrees C (36 and 46 degrees F). Do not freeze. Keep in carton to protect from light. Throw away this medicine if it is left out of the refrigerator for more than 48 hours. Throw away any unused medicine after the expiration date. NOTE: This sheet is a summary. It may not cover all possible information. If you have questions about this medicine, talk to your doctor, pharmacist, or health care provider.    2016, Elsevier/Gold Standard. (2014-08-15 14:30:14)  

## 2015-09-11 ENCOUNTER — Ambulatory Visit (HOSPITAL_BASED_OUTPATIENT_CLINIC_OR_DEPARTMENT_OTHER): Payer: BLUE CROSS/BLUE SHIELD

## 2015-09-11 ENCOUNTER — Ambulatory Visit (HOSPITAL_BASED_OUTPATIENT_CLINIC_OR_DEPARTMENT_OTHER): Payer: BLUE CROSS/BLUE SHIELD | Admitting: Hematology

## 2015-09-11 ENCOUNTER — Encounter: Payer: Self-pay | Admitting: *Deleted

## 2015-09-11 ENCOUNTER — Telehealth: Payer: Self-pay | Admitting: Hematology

## 2015-09-11 ENCOUNTER — Other Ambulatory Visit (HOSPITAL_BASED_OUTPATIENT_CLINIC_OR_DEPARTMENT_OTHER): Payer: BLUE CROSS/BLUE SHIELD

## 2015-09-11 ENCOUNTER — Encounter: Payer: Self-pay | Admitting: Hematology

## 2015-09-11 ENCOUNTER — Telehealth: Payer: Self-pay | Admitting: *Deleted

## 2015-09-11 VITALS — BP 147/89 | HR 86 | Temp 98.3°F | Resp 18 | Ht 66.0 in | Wt 164.1 lb

## 2015-09-11 VITALS — BP 141/73 | HR 72 | Temp 98.2°F | Resp 18

## 2015-09-11 DIAGNOSIS — C19 Malignant neoplasm of rectosigmoid junction: Secondary | ICD-10-CM

## 2015-09-11 DIAGNOSIS — C787 Secondary malignant neoplasm of liver and intrahepatic bile duct: Secondary | ICD-10-CM

## 2015-09-11 DIAGNOSIS — D561 Beta thalassemia: Secondary | ICD-10-CM | POA: Diagnosis not present

## 2015-09-11 DIAGNOSIS — C189 Malignant neoplasm of colon, unspecified: Secondary | ICD-10-CM

## 2015-09-11 DIAGNOSIS — Z5111 Encounter for antineoplastic chemotherapy: Secondary | ICD-10-CM

## 2015-09-11 DIAGNOSIS — D509 Iron deficiency anemia, unspecified: Secondary | ICD-10-CM | POA: Diagnosis not present

## 2015-09-11 DIAGNOSIS — Z5112 Encounter for antineoplastic immunotherapy: Secondary | ICD-10-CM | POA: Diagnosis not present

## 2015-09-11 DIAGNOSIS — D500 Iron deficiency anemia secondary to blood loss (chronic): Secondary | ICD-10-CM

## 2015-09-11 LAB — IRON AND TIBC
%SAT: 19 % — AB (ref 21–57)
Iron: 62 ug/dL (ref 41–142)
TIBC: 329 ug/dL (ref 236–444)
UIBC: 267 ug/dL (ref 120–384)

## 2015-09-11 LAB — COMPREHENSIVE METABOLIC PANEL
ALT: 13 U/L (ref 0–55)
ANION GAP: 12 meq/L — AB (ref 3–11)
AST: 22 U/L (ref 5–34)
Albumin: 4.1 g/dL (ref 3.5–5.0)
Alkaline Phosphatase: 148 U/L (ref 40–150)
BUN: 11.9 mg/dL (ref 7.0–26.0)
CALCIUM: 9.8 mg/dL (ref 8.4–10.4)
CHLORIDE: 105 meq/L (ref 98–109)
CO2: 24 meq/L (ref 22–29)
Creatinine: 0.9 mg/dL (ref 0.6–1.1)
EGFR: 78 mL/min/{1.73_m2} — ABNORMAL LOW (ref >90)
Glucose: 107 mg/dl (ref 70–140)
POTASSIUM: 3.7 meq/L (ref 3.5–5.1)
Sodium: 141 mEq/L (ref 136–145)
Total Bilirubin: 0.71 mg/dL (ref 0.20–1.20)
Total Protein: 8.2 g/dL (ref 6.4–8.3)

## 2015-09-11 LAB — CBC WITH DIFFERENTIAL/PLATELET
BASO%: 0.2 % (ref 0.0–2.0)
BASOS ABS: 0 10*3/uL (ref 0.0–0.1)
EOS ABS: 0.1 10*3/uL (ref 0.0–0.5)
EOS%: 0.6 % (ref 0.0–7.0)
HEMATOCRIT: 39.7 % (ref 34.8–46.6)
HEMOGLOBIN: 12 g/dL (ref 11.6–15.9)
LYMPH#: 2 10*3/uL (ref 0.9–3.3)
LYMPH%: 19.1 % (ref 14.0–49.7)
MCH: 23.3 pg — AB (ref 25.1–34.0)
MCHC: 30.2 g/dL — ABNORMAL LOW (ref 31.5–36.0)
MCV: 77.2 fL — AB (ref 79.5–101.0)
MONO#: 0.7 10*3/uL (ref 0.1–0.9)
MONO%: 7.1 % (ref 0.0–14.0)
NEUT%: 73 % (ref 38.4–76.8)
NEUTROS ABS: 7.6 10*3/uL — AB (ref 1.5–6.5)
PLATELETS: 338 10*3/uL (ref 145–400)
RBC: 5.14 10*6/uL (ref 3.70–5.45)
RDW: 18.5 % — AB (ref 11.2–14.5)
WBC: 10.3 10*3/uL (ref 3.9–10.3)

## 2015-09-11 LAB — UA PROTEIN, DIPSTICK - CHCC: PROTEIN: 30 mg/dL

## 2015-09-11 LAB — FERRITIN: Ferritin: 279 ng/ml — ABNORMAL HIGH (ref 9–269)

## 2015-09-11 MED ORDER — SODIUM CHLORIDE 0.9 % IV SOLN
2400.0000 mg/m2 | INTRAVENOUS | Status: AC
  Administered 2015-09-11: 4400 mg via INTRAVENOUS
  Filled 2015-09-11: qty 88

## 2015-09-11 MED ORDER — ATROPINE SULFATE 1 MG/ML IJ SOLN
INTRAMUSCULAR | Status: AC
  Filled 2015-09-11: qty 1

## 2015-09-11 MED ORDER — ATROPINE SULFATE 1 MG/ML IJ SOLN
0.5000 mg | Freq: Once | INTRAMUSCULAR | Status: AC | PRN
  Administered 2015-09-11: 0.5 mg via INTRAVENOUS

## 2015-09-11 MED ORDER — SODIUM CHLORIDE 0.9 % IV SOLN
Freq: Once | INTRAVENOUS | Status: AC
  Administered 2015-09-11: 10:00:00 via INTRAVENOUS

## 2015-09-11 MED ORDER — PALONOSETRON HCL INJECTION 0.25 MG/5ML
INTRAVENOUS | Status: AC
  Filled 2015-09-11: qty 5

## 2015-09-11 MED ORDER — FLUOROURACIL CHEMO INJECTION 2.5 GM/50ML
400.0000 mg/m2 | Freq: Once | INTRAVENOUS | Status: AC
  Administered 2015-09-11: 750 mg via INTRAVENOUS
  Filled 2015-09-11: qty 15

## 2015-09-11 MED ORDER — IRINOTECAN HCL CHEMO INJECTION 100 MG/5ML
180.0000 mg/m2 | Freq: Once | INTRAVENOUS | Status: AC
  Administered 2015-09-11: 330 mg via INTRAVENOUS
  Filled 2015-09-11: qty 16.5

## 2015-09-11 MED ORDER — SODIUM CHLORIDE 0.9 % IV SOLN
5.0000 mg/kg | Freq: Once | INTRAVENOUS | Status: AC
  Administered 2015-09-11: 350 mg via INTRAVENOUS
  Filled 2015-09-11: qty 14

## 2015-09-11 MED ORDER — LEUCOVORIN CALCIUM INJECTION 100 MG
20.0000 mg/m2 | Freq: Once | INTRAMUSCULAR | Status: AC
  Administered 2015-09-11: 36 mg via INTRAVENOUS
  Filled 2015-09-11: qty 1.8

## 2015-09-11 MED ORDER — OXYCODONE HCL 5 MG PO TABS: 5.0000 mg | ORAL_TABLET | Freq: Four times a day (QID) | ORAL | Status: DC | PRN

## 2015-09-11 MED ORDER — SODIUM CHLORIDE 0.9 % IV SOLN
Freq: Once | INTRAVENOUS | Status: AC
  Administered 2015-09-11: 11:00:00 via INTRAVENOUS
  Filled 2015-09-11: qty 2

## 2015-09-11 MED ORDER — LIDOCAINE-PRILOCAINE 2.5-2.5 % EX CREA: 1.0000 "application " | TOPICAL_CREAM | CUTANEOUS | Status: DC | PRN

## 2015-09-11 MED ORDER — PALONOSETRON HCL INJECTION 0.25 MG/5ML
0.2500 mg | Freq: Once | INTRAVENOUS | Status: AC
  Administered 2015-09-11: 0.25 mg via INTRAVENOUS

## 2015-09-11 NOTE — Patient Instructions (Signed)
[place] Discharge Instructions for Patients Receiving Chemotherapy  Today you received the following chemotherapy agents Irinotecan, Leucovorin, Avastin, 5-FU  To help prevent nausea and vomiting after your treatment, we encourage you to take your nausea medication    If you develop nausea and vomiting that is not controlled by your nausea medication, call the clinic.   BELOW ARE SYMPTOMS THAT SHOULD BE REPORTED IMMEDIATELY:  *FEVER GREATER THAN 100.5 F  *CHILLS WITH OR WITHOUT FEVER  NAUSEA AND VOMITING THAT IS NOT CONTROLLED WITH YOUR NAUSEA MEDICATION  *UNUSUAL SHORTNESS OF BREATH  *UNUSUAL BRUISING OR BLEEDING  TENDERNESS IN MOUTH AND THROAT WITH OR WITHOUT PRESENCE OF ULCERS  *URINARY PROBLEMS  *BOWEL PROBLEMS  UNUSUAL RASH Items with * indicate a potential emergency and should be followed up as soon as possible.  Feel free to call the clinic you have any questions or concerns. The clinic phone number is [phone].  Please show the [place] at check-in to the Emergency Department and triage nurse.

## 2015-09-11 NOTE — Progress Notes (Signed)
[place]  [phone] [phone]  Clinic follow up Note   Patient Care Team: Benito Mccreedy, MD as PCP - General (Internal Medicine) Juanita Craver, MD as Consulting Physician (Gastroenterology) Michael Boston, MD as Consulting Physician (General Surgery) Truitt Merle, MD as Consulting Physician (Oncology) 09/11/2015  CHIEF COMPLAINTS:  Follow up metastatic colon cancer      Oncology History   Metastatic colon cancer to liver   Staging form: Colon and Rectum, AJCC 7th Edition     Pathologic stage from 04/08/2015: T3, N0, M1 - Signed by Truitt Merle, MD on 04/22/2015  Presented to ER with intractable N/V; intermittent rectal bleeding X 3 months      Metastatic colon cancer to liver ([place])   04/03/2015 Procedure Colonoscopy showed a obstructing sigmoid rectal mass. Biopsy showed adenocarcinoma.   04/06/2015 Tumor Marker CEA=467.3 / CA19.9=1605   04/06/2015 Imaging CT chest, abdomen and pelvis with contrast showed sigmoid colon rectal mass, multiple (4) liver metastasis, with the largest 9.1 x 6.1 cm mass in the left lobe..   04/08/2015 Miscellaneous Foundation one genomic testing showed NRAS G60e and BRAF [id number] mutations   04/08/2015 Initial Diagnosis Metastatic colon cancer to liver   04/08/2015 Surgery Laparoscopic sigmoid colectomy, liver biopsy, port cath insertion, by Dr. Johney Maine    04/08/2015 Pathology Results Sigmoid colon segmental resection showed adenocarcinoma with mucinous features, pT 3, 15 lymph nodes all negative, surgical margins were negative. Liver biopsy showed metastatic adenocarcinoma.   05/08/2015 -  Chemotherapy FOLFIRI every 2 weeks, Avastin was added on from second cycle    07/14/2015 Imaging CT abdomen and pelvis showed interval slight improvement in liver metastasis. No other new lesions.     HISTORY OF PRESENTING ILLNESS:  Christina [name] 55 y.o. female with past medical history of endometriosis, uterine fibroids, thalassemia trait,  iron deficient anemia, who was recently found to have a sigmoid rectal cancer. I initially saw her in the hospital, she is here for the first follow-up.  She has been having intermittent rectal bleeding for the past 3 months. She thought it was related to her endometriosis, did not seek medical attention. She also has intermittent mild nausea, especially in the morning, and left lower quadrant abdominal pain. She was found to have profound anemia with hemoglobin 6.5 last week, and received blood transfusion. She was referred to gastroenterologist Dr. Collene Mares last week, underwent colonoscopy 2 days ago, which showed a obstructing sigmoid rectal mass, per patient, the colonoscopy report is not available today. Biopsy was done, but the pathology report is still pending.  She developed severe nausea, and vomited several times with clear gastric liquid. She called [name], and I was told to come to [facility] emergency room by on-call physician Dr. Carlean Purl. She had a CT scan done in the emergency room today.  Her appetite was fairly normal up to 2 days ago before recent hospital admission, when she was found to have a colorectal mass. She lost about 45 pounds in the past few weeks. 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 family history was positive for colon cancer in her maternal cousin. She is married, lives with her husband, no children.  CURRENT THERAPY: FOLFIRI and Avastin every 2 weeks, started on 05/08/2015, dose reduction and neulasta added from cycle 7  INTERIM HISTORY [name] returns for follow-up and chemo. She is doing very well, overall. She has more energy lately, she has increased her work from morning  to 3 PM, Monday through Friday, and he started her call also. She is able tolerate without much difficulty. She has good appetite and eating well. Her abdominal pain has resolved. She occasionally has some chest tightness after hours  of working, and resolves spontaneously. No cough, dyspnea, or other complaints. She has lost some hair, and some gray hair are growing back. She has no other complaints. She has gained a few pounds lately.   MEDICAL HISTORY:  Past Medical History  Diagnosis Date  . Hypertension   . Endometriosis   . Thalassanemia   . met colon ca to liver dx'd 03/2015    SURGICAL HISTORY: Past Surgical History  Procedure Laterality Date  . Myomectomy      Gyn in [place]  . Diagnostic laparoscopy      Endometriosis  . Colon resection N/A 04/08/2015    Procedure: LAPAROSCOPIC  RESECTION OF PART OF  SIGMOID COLON;  Surgeon: Michael Boston, MD;  Location: [facility];  Service: General;  Laterality: N/A;  . Liver biopsy N/A 04/08/2015    Procedure: CORE NEEDLE LIVER BIOPSY;  Surgeon: Michael Boston, MD;  Location: [facility];  Service: General;  Laterality: N/A;  . Portacath placement N/A 04/08/2015    Procedure: INSERTION PORT-A-CATH;  Surgeon: Michael Boston, MD;  Location: [facility];  Service: General;  Laterality: N/A;  . Laparoscopic sigmoid colectomy  04/08/2015    for colorectal cancer    SOCIAL HISTORY: Social History   Social History  . Marital Status: Married    Spouse Name: N/A  . Number of Children: N/A  . Years of Education: N/A   Occupational History  . Internal Medicine doctor     Works in [place] Topics  . Smoking status: Never Smoker   . Smokeless tobacco: Not on file  . Alcohol Use: Yes     Comment: socail drinker   . Drug Use: No  . Sexual Activity: Not on file   Other Topics Concern  . Not on file   Social History Narrative   Married, husband [name] (married X 2 years)   No children   IM Physician in [place]: Family History  Problem Relation Age of Onset  . Anesthesia problems Cousin 21    maternal cousin, colon cancer   . Clotting disorder Maternal Grandmother     ALLERGIES:  is allergic to lactose intolerance  (gi); milk-related compounds; and nsaids.  MEDICATIONS:  Current Outpatient Prescriptions  Medication Sig Dispense Refill  . dexamethasone (DECADRON) 4 MG tablet Take 1 tablet (4 mg total) by mouth 2 (two) times daily with a meal. 10 tablet 1  . lidocaine-prilocaine (EMLA) cream Apply 1 application topically as needed. 30 g 6  . NIFEdipine (PROCARDIA-XL/ADALAT CC) 30 MG 24 hr tablet Take 30 mg by mouth daily.    . ondansetron (ZOFRAN) 4 MG tablet Take 1 tablet (4 mg total) by mouth every 6 (six) hours as needed for nausea. 20 tablet 0  . potassium chloride SA (K-DUR,KLOR-CON) 20 MEQ tablet Take 1 tablet (20 mEq total) by mouth 2 (two) times daily. 20 tablet 1  . prochlorperazine (COMPAZINE) 10 MG tablet Take 1 tablet (10 mg total) by mouth every 6 (six) hours as needed for nausea or vomiting. 30 tablet 1  . oxyCODONE (OXY IR/ROXICODONE) 5 MG immediate release tablet Take 1 tablet (5 mg total) by mouth every 6 (six) hours as needed for moderate pain. (Patient not taking: Reported  on 09/11/2015) 40 tablet 0   No current facility-administered medications for this visit.    REVIEW OF SYSTEMS:   Constitutional: Denies fevers, chills or abnormal night sweats, (+) fatigue Eyes: Denies blurriness of vision, double vision or watery eyes Ears, nose, mouth, throat, and face: Denies mucositis or sore throat Respiratory: Denies cough, dyspnea or wheezes Cardiovascular: Denies palpitation, chest discomfort or lower extremity swelling Gastrointestinal:  (+) nausea, (+) RUQ and rectal pain, and mild constipation Skin: Denies abnormal skin rashes Lymphatics: Denies new lymphadenopathy or easy bruising Neurological:Denies numbness, tingling or new weaknesses Behavioral/Psych: Mood is stable, no new changes  All other systems were reviewed with the patient and are negative.  PHYSICAL EXAMINATION: ECOG PERFORMANCE STATUS: 1 - Symptomatic but completely ambulatory  Filed Vitals:   09/11/15 0920  BP:  147/89  Pulse: 86  Temp: 98.3 F (36.8 C)  Resp: 18   Filed Weights   09/11/15 0920  Weight: 164 lb 1.6 oz (74.435 kg)    GENERAL:alert, no distress and comfortable SKIN: skin color, texture, turgor are normal, no rashes or significant lesions EYES: normal, conjunctiva are pink and non-injected, sclera clear OROPHARYNX:no exudate, no erythema and lips, buccal mucosa, and tongue normal  NECK: supple, thyroid normal size, non-tender, without nodularity LYMPH:  no palpable lymphadenopathy in the cervical, axillary or inguinal LUNGS: clear to auscultation and percussion with normal breathing effort HEART: regular rate & rhythm and no murmurs and no lower extremity edema ABDOMEN:abdomen soft, non-tender and normal bowel sounds Musculoskeletal:no cyanosis of digits and no clubbing  PSYCH: alert & oriented x 3 with fluent speech NEURO: no focal motor/sensory deficits  LABORATORY DATA:  I have reviewed the data as listed CBC Latest Ref Rng 09/11/2015 08/28/2015 08/14/2015  WBC 3.9 - 10.3 10e3/uL 10.3 12.9(H) 6.7  Hemoglobin 11.6 - 15.9 g/dL 12.0 11.8 12.2  Hematocrit 34.8 - 46.6 % 39.7 38.9 40.1  Platelets 145 - 400 10e3/uL 338 393 285    CMP Latest Ref Rng 09/11/2015 08/28/2015 08/14/2015  Glucose 70 - 140 mg/dl 107 90 85  BUN 7.0 - 26.0 mg/dL 11.9 16.8 12.5  Creatinine 0.6 - 1.1 mg/dL 0.9 1.0 0.9  Sodium 136 - 145 mEq/L 141 140 140  Potassium 3.5 - 5.1 mEq/L 3.7 4.0 3.5  Chloride 101 - 111 mmol/L - - -  CO2 22 - 29 mEq/L _0 Calcium 8.4 - 10.4 mg/dL 9.8 9.8 9.9  Total Protein 6.4 - 8.3 g/dL 8.2 8.0 8.3  Total Bilirubin 0.20 - 1.20 mg/dL 0.71 0.48 0.65  Alkaline Phos 40 - 150 U/L 148 131 93  AST 5 - 34 U/L _1 ALT 0 - 55 U/L _2 Diagnosis 04/08/2015 1. Liver, needle/core biopsy, ? cancer - METASTATIC ADENOCARCINOMA. 2. Colon, segmental resection for tumor, sigmoid colon mass open end proximal - COLONIC ADENOCARCINOMA WITH MUCINOUS FEATURES EXTENDING INTO  PERICOLONIC ADIPOSE TISSUE AND SUBSEROSAL CONNECTIVE TISSUE. - MARGINS NOT INVOLVED. - FIFTEEN BENIGN LYMPH NODES (0/15). 3. Colon, resection margin (donut), distal anastomic ring - BENIGN COLON. - NO EVIDENCE OF MALIGNANCY.  Microscopic Comment Specimen: Sigmoid colon with liver biopsy and anastomotic rings. Procedure: Segmental resection with liver biopsy. Tumor site: Distal sigmoid. Specimen integrity: Intact. Macroscopic intactness of mesorectum: N/A Macroscopic tumor perforation: No. Invasive tumor: Maximum size: 8 cm Histologic type(s): Colorectal adenocarcinoma with mucinous features. Histologic grade and differentiation: G2: moderately differentiated/low grade Type of polyp in which invasive carcinoma arose: No residual polyp.  Microscopic extension of invasive tumor: Into pericolonic adipose tissue and subserosal connective tissue. Lymph-Vascular invasion: Present. Peri-neural invasion: Present. Tumor deposit(s) (discontinuous extramural extension): No. Resection margins: Proximal margin: Free of tumor. Distal margin: Free of tumor. Circumferential (radial) (posterior ascending, posterior descending; lateral and posterior mid-rectum; and entire lower 1/3 rectum): N/A Mesenteric margin (sigmoid and transverse): Free of tumor. Distance closest margin (if all above margins negative): 2 cm from mesenteric margin. Treatment effect (neo-adjuvant therapy): No. Additional polyp(s): No. Non-neoplastic findings: None. Lymph nodes: number examined - 15; number positive: 0 Pathologic Staging: pT3, pN0, pM1 Ancillary studies: MMR pending. (JDP:gt, 04/10/15) 2. Mismatch Repair (MMR) Protein Immunohistochemistry (IHC) IHC Expression Result: MLH1: Preserved nuclear expression (greater 50% tumor expression) MSH2: Preserved nuclear expression (greater 50% tumor expression) MSH6: Preserved nuclear expression (greater 50% tumor expression) PMS2: Preserved nuclear expression (greater 50%  tumor expression) * Internal control demonstrates intact nuclear expression Interpretation: NORMAL   FoundationOne test result   RADIOGRAPHIC STUDIES: I have personally reviewed the radiological images as listed and agreed with the findings in the report.  Ct abdomen and pelvis W Contrast 07/14/2015 IMPRESSION: 1. Interval slight improvement in previously demonstrated multifocal hepatic metastatic disease. 2. Interval resection of sigmoid colon mass and anastomosis. There is nonspecific colonic wall thickening proximal to the anastomosis which may be related to interval radiation therapy. Adjacent prominent lymph nodes are also nonspecific and potentially reactive, although small metastases cannot be excluded. PET-CT may be helpful for further evaluation. 3. Similar appearance of multiple pelvic masses, probably all uterine fibroids.   ASSESSMENT & PLAN:  55 year old African-American female, with past medical history of hypertension, thalassemia trait, iron deficient anemia, uterine fibroids, endometriosis, who presents with intermittent rectal bleeding, mild nausea, and mild intermittent left lower quadrant abdominal pain.   1. Sigmoid rectal adenocarcinoma, with liver metastases, pT3N0M1, stage IV, MMR normal, NRAS and BRAF mutation (+)  -I reviewed her surgical pathology findings with her in great details. Her liver biopsy confirmed metastatic adenocarcinoma  From colon cancer. Her primary sigmoid rectal tumor has been completely removed. -We again reviewed her CT findings, it showed at least 4 liver metastatic lesion, with the largest one measuring 9.1 cm in the left lobe. No other distant metastasis -We reviewed the natural history of metastatic colon cancer, and incurable nature at this is stage, giving her large metastatic disease in the liver. We also discussed that if she had excellent response to chemotherapy, I'll present her restaging CT scan in our GI tumor Board, to see if  she is a candidate for liver surgery or liver targeted therapy to maximally controlled her disease. -Her tumor has BRAF mutation, which is a poor prognostic factor  -Her tumor has NRAS mutation, not a candidate for EGFR inhibitor  -Given the MSI-stable, she might not benefit from immunotherapy, except in a clinically trial setting  -I reviewed her restaging CT scan from 07/14/15, which showed interval slight improvement in liver mets, no other new lesions. Her CEA only slightly decreased, overall her response has been limited. She however is clinically doing very well, tolerating treatment well, will continue current therapy. -lab reviewed, adequate for treatment, will proceed chemo today. Due to her prolonged neutropenia, I'll reduce her 5-FU dose slightly and adding Neulasta from cycle 7.   2. Iron deficient Anemia, and a beta thalassemia trait  -Secondary to GI bleeding and iron deficiency -Ferritin level was 8, consistent with iron deficiency from GI bleeding -She received Feraheme 510 mg twice,  responded very well. Her  anemia resolved, even she is getting chemotherapy.   4 RUQ abdominal pain -near resolved now.   -She takes oxycodone once every 3-4 days  5. Weight loss and malnutrition - improved,her weight has been stable lately, and gained a few pounds  -continue neutritional supplement  -follow up with dietician    Plan -cycle 8 FOLFIRI and avastin today, with Neulasta on day 3 -I'll see her back in 4 weeks, we'll order restaging CT scans on next visit  -I reviewed her oxycodone today  -  All questions were answered. The patient knows to call the clinic with any problems, questions or concerns. I spent 25 minutes counseling the patient face to face. The total time spent in the appointment was 30 minutes and more than 50% was on counseling.     Truitt Merle, MD 09/11/2015

## 2015-09-11 NOTE — Telephone Encounter (Signed)
Per staff message and POF I have scheduled appts. Advised scheduler of appts. JMW  

## 2015-09-11 NOTE — Telephone Encounter (Signed)
Scheduled patient appt per pof, avs report printed.  °

## 2015-09-11 NOTE — Progress Notes (Signed)
Oncology Nurse Navigator Documentation  Oncology Nurse Navigator Flowsheets 09/11/2015  Navigator Location [facility]  Navigator Encounter Type Treatment--4 month F/U  Patient Visit Type MedOnc  Treatment Phase Treatment-FOLFIRI  #9  Barriers/Navigation Needs No barriers at this time;No Questions;No Needs  Interventions None required  Referrals -  Coordination of Care -  Support Groups/Services -  Acuity Level 1  Time Spent with Patient 5

## 2015-09-12 LAB — CEA: CEA: 404.4 ng/mL — ABNORMAL HIGH (ref 0.0–4.7)

## 2015-09-12 LAB — CEA (PARALLEL TESTING): CEA: 430 ng/mL — AB (ref 0.0–5.0)

## 2015-09-13 ENCOUNTER — Ambulatory Visit (HOSPITAL_BASED_OUTPATIENT_CLINIC_OR_DEPARTMENT_OTHER): Payer: BLUE CROSS/BLUE SHIELD

## 2015-09-13 VITALS — BP 155/90 | HR 70 | Temp 98.3°F | Resp 18

## 2015-09-13 DIAGNOSIS — Z5189 Encounter for other specified aftercare: Secondary | ICD-10-CM

## 2015-09-13 DIAGNOSIS — C787 Secondary malignant neoplasm of liver and intrahepatic bile duct: Secondary | ICD-10-CM | POA: Diagnosis not present

## 2015-09-13 DIAGNOSIS — C19 Malignant neoplasm of rectosigmoid junction: Secondary | ICD-10-CM

## 2015-09-13 DIAGNOSIS — C189 Malignant neoplasm of colon, unspecified: Secondary | ICD-10-CM

## 2015-09-13 MED ORDER — PEGFILGRASTIM INJECTION 6 MG/0.6ML ~~LOC~~
6.0000 mg | PREFILLED_SYRINGE | Freq: Once | SUBCUTANEOUS | Status: AC
  Administered 2015-09-13: 6 mg via SUBCUTANEOUS
  Filled 2015-09-13: qty 0.6

## 2015-09-13 MED ORDER — HEPARIN SOD (PORK) LOCK FLUSH 100 UNIT/ML IV SOLN
500.0000 [IU] | Freq: Once | INTRAVENOUS | Status: AC | PRN
  Administered 2015-09-13: 500 [IU]
  Filled 2015-09-13: qty 5

## 2015-09-13 MED ORDER — SODIUM CHLORIDE 0.9 % IJ SOLN
10.0000 mL | INTRAMUSCULAR | Status: DC | PRN
  Administered 2015-09-13: 10 mL
  Filled 2015-09-13: qty 10

## 2015-09-13 NOTE — Patient Instructions (Signed)
Pegfilgrastim injection What is this medicine? PEGFILGRASTIM (PEG fil gra stim) is a long-acting granulocyte colony-stimulating factor that stimulates the growth of neutrophils, a type of white blood cell important in the body's fight against infection. It is used to reduce the incidence of fever and infection in patients with certain types of cancer who are receiving chemotherapy that affects the bone marrow, and to increase survival after being exposed to high doses of radiation. This medicine may be used for other purposes; ask your health care provider or pharmacist if you have questions. What should I tell my health care provider before I take this medicine? They need to know if you have any of these conditions: -kidney disease -latex allergy -ongoing radiation therapy -sickle cell disease -skin reactions to acrylic adhesives (On-Body Injector only) -an unusual or allergic reaction to pegfilgrastim, filgrastim, other medicines, foods, dyes, or preservatives -pregnant or trying to get pregnant -breast-feeding How should I use this medicine? This medicine is for injection under the skin. If you get this medicine at home, you will be taught how to prepare and give the pre-filled syringe or how to use the On-body Injector. Refer to the patient Instructions for Use for detailed instructions. Use exactly as directed. Take your medicine at regular intervals. Do not take your medicine more often than directed. It is important that you put your used needles and syringes in a special sharps container. Do not put them in a trash can. If you do not have a sharps container, call your pharmacist or healthcare provider to get one. Talk to your pediatrician regarding the use of this medicine in children. While this drug may be prescribed for selected conditions, precautions do apply. Overdosage: If you think you have taken too much of this medicine contact a poison control center or emergency room at  once. NOTE: This medicine is only for you. Do not share this medicine with others. What if I miss a dose? It is important not to miss your dose. Call your doctor or health care professional if you miss your dose. If you miss a dose due to an On-body Injector failure or leakage, a new dose should be administered as soon as possible using a single prefilled syringe for manual use. What may interact with this medicine? Interactions have not been studied. Give your health care provider a list of all the medicines, herbs, non-prescription drugs, or dietary supplements you use. Also tell them if you smoke, drink alcohol, or use illegal drugs. Some items may interact with your medicine. This list may not describe all possible interactions. Give your health care provider a list of all the medicines, herbs, non-prescription drugs, or dietary supplements you use. Also tell them if you smoke, drink alcohol, or use illegal drugs. Some items may interact with your medicine. What should I watch for while using this medicine? You may need blood work done while you are taking this medicine. If you are going to need a MRI, CT scan, or other procedure, tell your doctor that you are using this medicine (On-Body Injector only). What side effects may I notice from receiving this medicine? Side effects that you should report to your doctor or health care professional as soon as possible: -allergic reactions like skin rash, itching or hives, swelling of the face, lips, or tongue -dizziness -fever -pain, redness, or irritation at site where injected -pinpoint red spots on the skin -red or dark-brown urine -shortness of breath or breathing problems -stomach or side pain, or pain   at the shoulder -swelling -tiredness -trouble passing urine or change in the amount of urine Side effects that usually do not require medical attention (report to your doctor or health care professional if they continue or are  bothersome): -bone pain -muscle pain This list may not describe all possible side effects. Call your doctor for medical advice about side effects. You may report side effects to FDA at 1-800-FDA-1088. Where should I keep my medicine? Keep out of the reach of children. Store pre-filled syringes in a refrigerator between 2 and 8 degrees C (36 and 46 degrees F). Do not freeze. Keep in carton to protect from light. Throw away this medicine if it is left out of the refrigerator for more than 48 hours. Throw away any unused medicine after the expiration date. NOTE: This sheet is a summary. It may not cover all possible information. If you have questions about this medicine, talk to your doctor, pharmacist, or health care provider.    2016, Elsevier/Gold Standard. (2014-08-15 14:30:14)  

## 2015-09-25 ENCOUNTER — Other Ambulatory Visit (HOSPITAL_BASED_OUTPATIENT_CLINIC_OR_DEPARTMENT_OTHER): Payer: BLUE CROSS/BLUE SHIELD

## 2015-09-25 ENCOUNTER — Other Ambulatory Visit: Payer: Self-pay | Admitting: Hematology

## 2015-09-25 ENCOUNTER — Ambulatory Visit (HOSPITAL_BASED_OUTPATIENT_CLINIC_OR_DEPARTMENT_OTHER): Payer: BLUE CROSS/BLUE SHIELD

## 2015-09-25 VITALS — BP 154/82 | HR 76 | Temp 98.4°F | Resp 16

## 2015-09-25 DIAGNOSIS — C19 Malignant neoplasm of rectosigmoid junction: Secondary | ICD-10-CM | POA: Diagnosis not present

## 2015-09-25 DIAGNOSIS — C189 Malignant neoplasm of colon, unspecified: Secondary | ICD-10-CM

## 2015-09-25 DIAGNOSIS — C787 Secondary malignant neoplasm of liver and intrahepatic bile duct: Secondary | ICD-10-CM | POA: Diagnosis not present

## 2015-09-25 DIAGNOSIS — Z5111 Encounter for antineoplastic chemotherapy: Secondary | ICD-10-CM | POA: Diagnosis not present

## 2015-09-25 DIAGNOSIS — Z5112 Encounter for antineoplastic immunotherapy: Secondary | ICD-10-CM

## 2015-09-25 LAB — COMPREHENSIVE METABOLIC PANEL
ALT: 12 U/L (ref 0–55)
ANION GAP: 11 meq/L (ref 3–11)
AST: 19 U/L (ref 5–34)
Albumin: 4 g/dL (ref 3.5–5.0)
Alkaline Phosphatase: 157 U/L — ABNORMAL HIGH (ref 40–150)
BUN: 11.7 mg/dL (ref 7.0–26.0)
CALCIUM: 9.7 mg/dL (ref 8.4–10.4)
CHLORIDE: 106 meq/L (ref 98–109)
CO2: 25 mEq/L (ref 22–29)
CREATININE: 1 mg/dL (ref 0.6–1.1)
EGFR: 75 mL/min/{1.73_m2} — AB (ref >90)
Glucose: 111 mg/dl (ref 70–140)
POTASSIUM: 3.7 meq/L (ref 3.5–5.1)
Sodium: 141 mEq/L (ref 136–145)
Total Bilirubin: 0.74 mg/dL (ref 0.20–1.20)
Total Protein: 8.1 g/dL (ref 6.4–8.3)

## 2015-09-25 LAB — CBC WITH DIFFERENTIAL/PLATELET
BASO%: 0.4 % (ref 0.0–2.0)
Basophils Absolute: 0 10*3/uL (ref 0.0–0.1)
EOS%: 1 % (ref 0.0–7.0)
Eosinophils Absolute: 0.1 10*3/uL (ref 0.0–0.5)
HCT: 38.5 % (ref 34.8–46.6)
HGB: 11.7 g/dL (ref 11.6–15.9)
LYMPH%: 22 % (ref 14.0–49.7)
MCH: 23.4 pg — ABNORMAL LOW (ref 25.1–34.0)
MCHC: 30.5 g/dL — ABNORMAL LOW (ref 31.5–36.0)
MCV: 76.8 fL — ABNORMAL LOW (ref 79.5–101.0)
MONO#: 0.6 10*3/uL (ref 0.1–0.9)
MONO%: 6.6 % (ref 0.0–14.0)
NEUT%: 70 % (ref 38.4–76.8)
NEUTROS ABS: 6.1 10*3/uL (ref 1.5–6.5)
Platelets: 351 10*3/uL (ref 145–400)
RBC: 5.01 10*6/uL (ref 3.70–5.45)
RDW: 17.8 % — AB (ref 11.2–14.5)
WBC: 8.8 10*3/uL (ref 3.9–10.3)
lymph#: 1.9 10*3/uL (ref 0.9–3.3)

## 2015-09-25 LAB — UA PROTEIN, DIPSTICK - CHCC: PROTEIN: NEGATIVE mg/dL

## 2015-09-25 MED ORDER — LEUCOVORIN CALCIUM INJECTION 350 MG
400.0000 mg/m2 | Freq: Once | INTRAVENOUS | Status: AC
  Administered 2015-09-25: 732 mg via INTRAVENOUS
  Filled 2015-09-25: qty 36.6

## 2015-09-25 MED ORDER — FLUOROURACIL CHEMO INJECTION 2.5 GM/50ML
400.0000 mg/m2 | Freq: Once | INTRAVENOUS | Status: AC
  Administered 2015-09-25: 750 mg via INTRAVENOUS
  Filled 2015-09-25: qty 15

## 2015-09-25 MED ORDER — ATROPINE SULFATE 1 MG/ML IJ SOLN
0.5000 mg | Freq: Once | INTRAMUSCULAR | Status: AC | PRN
  Administered 2015-09-25: 0.5 mg via INTRAVENOUS

## 2015-09-25 MED ORDER — PALONOSETRON HCL INJECTION 0.25 MG/5ML
0.2500 mg | Freq: Once | INTRAVENOUS | Status: AC
  Administered 2015-09-25: 0.25 mg via INTRAVENOUS

## 2015-09-25 MED ORDER — ATROPINE SULFATE 1 MG/ML IJ SOLN
INTRAMUSCULAR | Status: AC
  Filled 2015-09-25: qty 1

## 2015-09-25 MED ORDER — IRINOTECAN HCL CHEMO INJECTION 100 MG/5ML
180.0000 mg/m2 | Freq: Once | INTRAVENOUS | Status: AC
  Administered 2015-09-25: 330 mg via INTRAVENOUS
  Filled 2015-09-25: qty 5.5

## 2015-09-25 MED ORDER — DEXAMETHASONE SODIUM PHOSPHATE 100 MG/10ML IJ SOLN
Freq: Once | INTRAMUSCULAR | Status: AC
  Administered 2015-09-25: 09:00:00 via INTRAVENOUS
  Filled 2015-09-25: qty 2

## 2015-09-25 MED ORDER — SODIUM CHLORIDE 0.9 % IV SOLN
2400.0000 mg/m2 | INTRAVENOUS | Status: DC
  Administered 2015-09-25: 4400 mg via INTRAVENOUS
  Filled 2015-09-25: qty 88

## 2015-09-25 MED ORDER — SODIUM CHLORIDE 0.9 % IV SOLN
5.0000 mg/kg | Freq: Once | INTRAVENOUS | Status: AC
  Administered 2015-09-25: 350 mg via INTRAVENOUS
  Filled 2015-09-25: qty 14

## 2015-09-25 MED ORDER — SODIUM CHLORIDE 0.9 % IV SOLN
Freq: Once | INTRAVENOUS | Status: AC
  Administered 2015-09-25: 09:00:00 via INTRAVENOUS

## 2015-09-25 MED ORDER — PALONOSETRON HCL INJECTION 0.25 MG/5ML
INTRAVENOUS | Status: AC
Start: 2015-09-25 — End: 2015-09-25
  Filled 2015-09-25: qty 5

## 2015-09-25 NOTE — Patient Instructions (Signed)
[place] Discharge Instructions for Patients Receiving Chemotherapy  Today you received the following chemotherapy agents: Avastin, Irinotecan, Leucovorin, 5FU  To help prevent nausea and vomiting after your treatment, we encourage you to take your nausea medication: Compazine 10 mg every 6 hours as needed; Zofran 4 mg every 6 hours as needed.   If you develop nausea and vomiting that is not controlled by your nausea medication, call the clinic.   BELOW ARE SYMPTOMS THAT SHOULD BE REPORTED IMMEDIATELY:  *FEVER GREATER THAN 100.5 F  *CHILLS WITH OR WITHOUT FEVER  NAUSEA AND VOMITING THAT IS NOT CONTROLLED WITH YOUR NAUSEA MEDICATION  *UNUSUAL SHORTNESS OF BREATH  *UNUSUAL BRUISING OR BLEEDING  TENDERNESS IN MOUTH AND THROAT WITH OR WITHOUT PRESENCE OF ULCERS  *URINARY PROBLEMS  *BOWEL PROBLEMS  UNUSUAL RASH Items with * indicate a potential emergency and should be followed up as soon as possible.  Feel free to call the clinic you have any questions or concerns. The clinic phone number is [phone].  Please show the [place] at check-in to the Emergency Department and triage nurse.

## 2015-09-27 ENCOUNTER — Ambulatory Visit (HOSPITAL_BASED_OUTPATIENT_CLINIC_OR_DEPARTMENT_OTHER): Payer: BLUE CROSS/BLUE SHIELD

## 2015-09-27 VITALS — BP 138/82 | HR 77 | Temp 98.3°F | Resp 18 | Ht 66.0 in

## 2015-09-27 DIAGNOSIS — Z5189 Encounter for other specified aftercare: Secondary | ICD-10-CM | POA: Diagnosis not present

## 2015-09-27 DIAGNOSIS — C787 Secondary malignant neoplasm of liver and intrahepatic bile duct: Secondary | ICD-10-CM

## 2015-09-27 DIAGNOSIS — C189 Malignant neoplasm of colon, unspecified: Secondary | ICD-10-CM | POA: Diagnosis not present

## 2015-09-27 MED ORDER — SODIUM CHLORIDE 0.9 % IJ SOLN
10.0000 mL | INTRAMUSCULAR | Status: DC | PRN
  Administered 2015-09-27: 10 mL
  Filled 2015-09-27: qty 10

## 2015-09-27 MED ORDER — PEGFILGRASTIM INJECTION 6 MG/0.6ML ~~LOC~~
6.0000 mg | PREFILLED_SYRINGE | Freq: Once | SUBCUTANEOUS | Status: AC
  Administered 2015-09-27: 6 mg via SUBCUTANEOUS

## 2015-09-27 MED ORDER — HEPARIN SOD (PORK) LOCK FLUSH 100 UNIT/ML IV SOLN
500.0000 [IU] | Freq: Once | INTRAVENOUS | Status: AC | PRN
  Administered 2015-09-27: 500 [IU]
  Filled 2015-09-27: qty 5

## 2015-10-05 ENCOUNTER — Other Ambulatory Visit: Payer: Self-pay | Admitting: Hematology

## 2015-10-09 ENCOUNTER — Ambulatory Visit (HOSPITAL_BASED_OUTPATIENT_CLINIC_OR_DEPARTMENT_OTHER): Payer: BLUE CROSS/BLUE SHIELD | Admitting: Nurse Practitioner

## 2015-10-09 ENCOUNTER — Other Ambulatory Visit (HOSPITAL_BASED_OUTPATIENT_CLINIC_OR_DEPARTMENT_OTHER): Payer: BLUE CROSS/BLUE SHIELD

## 2015-10-09 ENCOUNTER — Telehealth: Payer: Self-pay | Admitting: Hematology

## 2015-10-09 ENCOUNTER — Ambulatory Visit (HOSPITAL_BASED_OUTPATIENT_CLINIC_OR_DEPARTMENT_OTHER): Payer: BLUE CROSS/BLUE SHIELD

## 2015-10-09 VITALS — BP 152/85 | HR 80 | Temp 98.7°F | Resp 16 | Wt 166.2 lb

## 2015-10-09 DIAGNOSIS — D561 Beta thalassemia: Secondary | ICD-10-CM

## 2015-10-09 DIAGNOSIS — C189 Malignant neoplasm of colon, unspecified: Secondary | ICD-10-CM

## 2015-10-09 DIAGNOSIS — Z5112 Encounter for antineoplastic immunotherapy: Secondary | ICD-10-CM | POA: Diagnosis not present

## 2015-10-09 DIAGNOSIS — C19 Malignant neoplasm of rectosigmoid junction: Secondary | ICD-10-CM | POA: Diagnosis not present

## 2015-10-09 DIAGNOSIS — C787 Secondary malignant neoplasm of liver and intrahepatic bile duct: Secondary | ICD-10-CM | POA: Diagnosis not present

## 2015-10-09 DIAGNOSIS — D509 Iron deficiency anemia, unspecified: Secondary | ICD-10-CM

## 2015-10-09 DIAGNOSIS — Z5111 Encounter for antineoplastic chemotherapy: Secondary | ICD-10-CM

## 2015-10-09 LAB — CBC WITH DIFFERENTIAL/PLATELET
BASO%: 0.5 % (ref 0.0–2.0)
Basophils Absolute: 0.1 10*3/uL (ref 0.0–0.1)
EOS%: 1.2 % (ref 0.0–7.0)
Eosinophils Absolute: 0.1 10*3/uL (ref 0.0–0.5)
HCT: 39.9 % (ref 34.8–46.6)
HGB: 12.3 g/dL (ref 11.6–15.9)
LYMPH%: 17.8 % (ref 14.0–49.7)
MCH: 23.4 pg — ABNORMAL LOW (ref 25.1–34.0)
MCHC: 30.9 g/dL — ABNORMAL LOW (ref 31.5–36.0)
MCV: 75.6 fL — ABNORMAL LOW (ref 79.5–101.0)
MONO#: 0.7 10*3/uL (ref 0.1–0.9)
MONO%: 6.6 % (ref 0.0–14.0)
NEUT%: 73.9 % (ref 38.4–76.8)
NEUTROS ABS: 8.2 10*3/uL — AB (ref 1.5–6.5)
Platelets: 355 10*3/uL (ref 145–400)
RBC: 5.28 10*6/uL (ref 3.70–5.45)
RDW: 17.7 % — AB (ref 11.2–14.5)
WBC: 11.2 10*3/uL — AB (ref 3.9–10.3)
lymph#: 2 10*3/uL (ref 0.9–3.3)

## 2015-10-09 LAB — COMPREHENSIVE METABOLIC PANEL
ALT: 9 U/L (ref 0–55)
ANION GAP: 12 meq/L — AB (ref 3–11)
AST: 16 U/L (ref 5–34)
Albumin: 4.1 g/dL (ref 3.5–5.0)
Alkaline Phosphatase: 153 U/L — ABNORMAL HIGH (ref 40–150)
BILIRUBIN TOTAL: 0.75 mg/dL (ref 0.20–1.20)
BUN: 11.9 mg/dL (ref 7.0–26.0)
CHLORIDE: 105 meq/L (ref 98–109)
CO2: 24 mEq/L (ref 22–29)
CREATININE: 1.1 mg/dL (ref 0.6–1.1)
Calcium: 9.8 mg/dL (ref 8.4–10.4)
EGFR: 68 mL/min/{1.73_m2} — ABNORMAL LOW (ref >90)
GLUCOSE: 106 mg/dL (ref 70–140)
Potassium: 3.6 mEq/L (ref 3.5–5.1)
SODIUM: 141 meq/L (ref 136–145)
TOTAL PROTEIN: 8.2 g/dL (ref 6.4–8.3)

## 2015-10-09 LAB — UA PROTEIN, DIPSTICK - CHCC: Protein, ur: 30 mg/dL

## 2015-10-09 MED ORDER — PALONOSETRON HCL INJECTION 0.25 MG/5ML
INTRAVENOUS | Status: AC
  Filled 2015-10-09: qty 5

## 2015-10-09 MED ORDER — OXYCODONE HCL 5 MG PO TABS: 5.0000 mg | ORAL_TABLET | Freq: Four times a day (QID) | ORAL | Status: DC | PRN

## 2015-10-09 MED ORDER — ATROPINE SULFATE 1 MG/ML IJ SOLN
0.5000 mg | Freq: Once | INTRAMUSCULAR | Status: AC | PRN
  Administered 2015-10-09: 0.5 mg via INTRAVENOUS

## 2015-10-09 MED ORDER — SODIUM CHLORIDE 0.9 % IV SOLN
Freq: Once | INTRAVENOUS | Status: AC
  Administered 2015-10-09: 10:00:00 via INTRAVENOUS

## 2015-10-09 MED ORDER — SODIUM CHLORIDE 0.9 % IV SOLN
2400.0000 mg/m2 | INTRAVENOUS | Status: DC
  Administered 2015-10-09: 4400 mg via INTRAVENOUS
  Filled 2015-10-09: qty 88

## 2015-10-09 MED ORDER — IRINOTECAN HCL CHEMO INJECTION 100 MG/5ML
180.0000 mg/m2 | Freq: Once | INTRAVENOUS | Status: AC
  Administered 2015-10-09: 330 mg via INTRAVENOUS
  Filled 2015-10-09: qty 5.5

## 2015-10-09 MED ORDER — ATROPINE SULFATE 1 MG/ML IJ SOLN
INTRAMUSCULAR | Status: AC
  Filled 2015-10-09: qty 1

## 2015-10-09 MED ORDER — PALONOSETRON HCL INJECTION 0.25 MG/5ML
0.2500 mg | Freq: Once | INTRAVENOUS | Status: AC
  Administered 2015-10-09: 0.25 mg via INTRAVENOUS

## 2015-10-09 MED ORDER — FLUOROURACIL CHEMO INJECTION 2.5 GM/50ML
400.0000 mg/m2 | Freq: Once | INTRAVENOUS | Status: AC
  Administered 2015-10-09: 750 mg via INTRAVENOUS
  Filled 2015-10-09: qty 15

## 2015-10-09 MED ORDER — SODIUM CHLORIDE 0.9 % IV SOLN
5.0000 mg/kg | Freq: Once | INTRAVENOUS | Status: AC
  Administered 2015-10-09: 350 mg via INTRAVENOUS
  Filled 2015-10-09: qty 14

## 2015-10-09 MED ORDER — LEUCOVORIN CALCIUM INJECTION 350 MG
400.0000 mg/m2 | Freq: Once | INTRAVENOUS | Status: AC
  Administered 2015-10-09: 732 mg via INTRAVENOUS
  Filled 2015-10-09: qty 36.6

## 2015-10-09 MED ORDER — SODIUM CHLORIDE 0.9 % IV SOLN
Freq: Once | INTRAVENOUS | Status: AC
  Administered 2015-10-09: 10:00:00 via INTRAVENOUS
  Filled 2015-10-09: qty 2

## 2015-10-09 NOTE — Patient Instructions (Signed)
[facility] Discharge Instructions for Patients Receiving Chemotherapy  Today you received the following chemotherapy agents Avastin/Irinotecan/Leucovorin/5 FU To help prevent nausea and vomiting after your treatment, we encourage you to take your nausea medication as prescribed.   If you develop nausea and vomiting that is not controlled by your nausea medication, call the clinic.   BELOW ARE SYMPTOMS THAT SHOULD BE REPORTED IMMEDIATELY:  *FEVER GREATER THAN 100.5 F  *CHILLS WITH OR WITHOUT FEVER  NAUSEA AND VOMITING THAT IS NOT CONTROLLED WITH YOUR NAUSEA MEDICATION  *UNUSUAL SHORTNESS OF BREATH  *UNUSUAL BRUISING OR BLEEDING  TENDERNESS IN MOUTH AND THROAT WITH OR WITHOUT PRESENCE OF ULCERS  *URINARY PROBLEMS  *BOWEL PROBLEMS  UNUSUAL RASH Items with * indicate a potential emergency and should be followed up as soon as possible.  Feel free to call the clinic you have any questions or concerns. The clinic phone number is [phone].  Please show the CHEMO ALERT CARD at check-in to the Emergency Department and triage nurse.   

## 2015-10-09 NOTE — Progress Notes (Signed)
Patient had an episode of emisis while in the bathroom after Leucovorin and Camptosar were started.  No further complaints.

## 2015-10-09 NOTE — Progress Notes (Signed)
  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 She completed cycle 10 FOLFIRI/Avastin 09/25/2015. She has mild intermittent nausea for about 3 days after the chemotherapy. No mouth sores. No diarrhea. No hand or foot pain or redness. She notes blood with nose blowing at times. No other bleeding. No shortness of breath. No leg swelling or calf pain. She has "bone pain" following Neulasta. Since the colon surgery she has had intermittent pain at the lower chest/upper abdomen. She occasionally takes oxycodone. She has a good appetite. She is gaining weight.  Objective:  Vital signs in last 24 hours:  Blood pressure 152/85, pulse 80, temperature 98.7 F (37.1 C), temperature source Oral, resp. rate 16, weight 166 lb 4 oz (75.411 kg), SpO2 100 %.    HEENT: No thrush or ulcers. Resp: Lungs clear bilaterally. Cardio: Regular rate and rhythm. GI: Abdomen soft and nontender. No hepatomegaly. No mass. Vascular: No leg edema. Calves soft and nontender. Skin: Palms with hyperpigmentation. Port-A-Cath without erythema.    Lab Results:  Lab Results  Component Value Date   WBC 11.2* 10/09/2015   HGB 12.3 10/09/2015   HCT 39.9 10/09/2015   MCV 75.6* 10/09/2015   PLT 355 10/09/2015   NEUTROABS 8.2* 10/09/2015    Imaging:  No results found.  Medications: I have reviewed the patient's current medications.  Assessment/Plan: 1. Sigmoid rectal adenocarcinoma, with liver metastases, pT3N0M1, stage IV, MMR normal, NRAS and BRAF mutation (+) on active treatment with FOLFIRI/Avastin. 2. Iron deficiency anemia, beta thalassemia trait. Hemoglobin improved today.   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 She has completed 10 cycles of FOLFIRI/Avastin. Plan to proceed with cycle 11 today as scheduled. Per Dr. Ernestina [name] last office note she will have restaging CT scans prior to her next visit in 2 weeks.  She  will return for a follow-up visit on 10/23/2015. She will contact the office in the interim with any problems. She was provided with a new oxycodone prescription today.    [name] ANP/GNP-BC   10/09/2015  9:27 AM

## 2015-10-09 NOTE — Telephone Encounter (Signed)
Gave and printed appt sched and avs for pt for March. °

## 2015-10-10 LAB — CEA (PARALLEL TESTING): CEA: 453.6 ng/mL — AB

## 2015-10-10 LAB — CEA: CEA1: 472.7 ng/mL — AB (ref 0.0–4.7)

## 2015-10-11 ENCOUNTER — Ambulatory Visit (HOSPITAL_BASED_OUTPATIENT_CLINIC_OR_DEPARTMENT_OTHER): Payer: BLUE CROSS/BLUE SHIELD

## 2015-10-11 ENCOUNTER — Ambulatory Visit: Payer: BLUE CROSS/BLUE SHIELD

## 2015-10-11 VITALS — BP 150/75 | HR 84 | Temp 98.2°F | Resp 20

## 2015-10-11 DIAGNOSIS — C19 Malignant neoplasm of rectosigmoid junction: Secondary | ICD-10-CM | POA: Diagnosis not present

## 2015-10-11 DIAGNOSIS — C787 Secondary malignant neoplasm of liver and intrahepatic bile duct: Secondary | ICD-10-CM

## 2015-10-11 DIAGNOSIS — Z5189 Encounter for other specified aftercare: Secondary | ICD-10-CM | POA: Diagnosis not present

## 2015-10-11 MED ORDER — PEGFILGRASTIM INJECTION 6 MG/0.6ML ~~LOC~~
6.0000 mg | PREFILLED_SYRINGE | Freq: Once | SUBCUTANEOUS | Status: AC
  Administered 2015-10-11: 6 mg via SUBCUTANEOUS

## 2015-10-11 MED ORDER — HEPARIN SOD (PORK) LOCK FLUSH 100 UNIT/ML IV SOLN
500.0000 [IU] | Freq: Once | INTRAVENOUS | Status: AC | PRN
  Administered 2015-10-11: 500 [IU]
  Filled 2015-10-11: qty 5

## 2015-10-11 MED ORDER — SODIUM CHLORIDE 0.9 % IJ SOLN
10.0000 mL | INTRAMUSCULAR | Status: DC | PRN
  Administered 2015-10-11: 10 mL
  Filled 2015-10-11: qty 10

## 2015-10-11 NOTE — Patient Instructions (Signed)
Pegfilgrastim injection What is this medicine? PEGFILGRASTIM (PEG fil gra stim) is a long-acting granulocyte colony-stimulating factor that stimulates the growth of neutrophils, a type of white blood cell important in the body's fight against infection. It is used to reduce the incidence of fever and infection in patients with certain types of cancer who are receiving chemotherapy that affects the bone marrow, and to increase survival after being exposed to high doses of radiation. This medicine may be used for other purposes; ask your health care provider or pharmacist if you have questions. What should I tell my health care provider before I take this medicine? They need to know if you have any of these conditions: -kidney disease -latex allergy -ongoing radiation therapy -sickle cell disease -skin reactions to acrylic adhesives (On-Body Injector only) -an unusual or allergic reaction to pegfilgrastim, filgrastim, other medicines, foods, dyes, or preservatives -pregnant or trying to get pregnant -breast-feeding How should I use this medicine? This medicine is for injection under the skin. If you get this medicine at home, you will be taught how to prepare and give the pre-filled syringe or how to use the On-body Injector. Refer to the patient Instructions for Use for detailed instructions. Use exactly as directed. Take your medicine at regular intervals. Do not take your medicine more often than directed. It is important that you put your used needles and syringes in a special sharps container. Do not put them in a trash can. If you do not have a sharps container, call your pharmacist or healthcare provider to get one. Talk to your pediatrician regarding the use of this medicine in children. While this drug may be prescribed for selected conditions, precautions do apply. Overdosage: If you think you have taken too much of this medicine contact a poison control center or emergency room at  once. NOTE: This medicine is only for you. Do not share this medicine with others. What if I miss a dose? It is important not to miss your dose. Call your doctor or health care professional if you miss your dose. If you miss a dose due to an On-body Injector failure or leakage, a new dose should be administered as soon as possible using a single prefilled syringe for manual use. What may interact with this medicine? Interactions have not been studied. Give your health care provider a list of all the medicines, herbs, non-prescription drugs, or dietary supplements you use. Also tell them if you smoke, drink alcohol, or use illegal drugs. Some items may interact with your medicine. This list may not describe all possible interactions. Give your health care provider a list of all the medicines, herbs, non-prescription drugs, or dietary supplements you use. Also tell them if you smoke, drink alcohol, or use illegal drugs. Some items may interact with your medicine. What should I watch for while using this medicine? You may need blood work done while you are taking this medicine. If you are going to need a MRI, CT scan, or other procedure, tell your doctor that you are using this medicine (On-Body Injector only). What side effects may I notice from receiving this medicine? Side effects that you should report to your doctor or health care professional as soon as possible: -allergic reactions like skin rash, itching or hives, swelling of the face, lips, or tongue -dizziness -fever -pain, redness, or irritation at site where injected -pinpoint red spots on the skin -red or dark-brown urine -shortness of breath or breathing problems -stomach or side pain, or pain   at the shoulder -swelling -tiredness -trouble passing urine or change in the amount of urine Side effects that usually do not require medical attention (report to your doctor or health care professional if they continue or are  bothersome): -bone pain -muscle pain This list may not describe all possible side effects. Call your doctor for medical advice about side effects. You may report side effects to FDA at 1-800-FDA-1088. Where should I keep my medicine? Keep out of the reach of children. Store pre-filled syringes in a refrigerator between 2 and 8 degrees C (36 and 46 degrees F). Do not freeze. Keep in carton to protect from light. Throw away this medicine if it is left out of the refrigerator for more than 48 hours. Throw away any unused medicine after the expiration date. NOTE: This sheet is a summary. It may not cover all possible information. If you have questions about this medicine, talk to your doctor, pharmacist, or health care provider.    2016, Elsevier/Gold Standard. (2014-08-15 14:30:14)  

## 2015-10-22 ENCOUNTER — Ambulatory Visit (HOSPITAL_COMMUNITY)
Admission: RE | Admit: 2015-10-22 | Discharge: 2015-10-22 | Disposition: A | Discharge status: Home / Self Care | Payer: BLUE CROSS/BLUE SHIELD | Source: Ambulatory Visit | Attending: Nurse Practitioner | Admitting: Nurse Practitioner

## 2015-10-22 ENCOUNTER — Ambulatory Visit (HOSPITAL_COMMUNITY): Payer: BLUE CROSS/BLUE SHIELD

## 2015-10-22 DIAGNOSIS — D259 Leiomyoma of uterus, unspecified: Secondary | ICD-10-CM | POA: Diagnosis not present

## 2015-10-22 DIAGNOSIS — C787 Secondary malignant neoplasm of liver and intrahepatic bile duct: Secondary | ICD-10-CM | POA: Diagnosis present

## 2015-10-22 DIAGNOSIS — C189 Malignant neoplasm of colon, unspecified: Secondary | ICD-10-CM | POA: Insufficient documentation

## 2015-10-22 DIAGNOSIS — Z9889 Other specified postprocedural states: Secondary | ICD-10-CM | POA: Insufficient documentation

## 2015-10-22 MED ORDER — IOHEXOL 300 MG/ML  SOLN
100.0000 mL | Freq: Once | INTRAMUSCULAR | Status: AC | PRN
  Administered 2015-10-22: 100 mL via INTRAVENOUS

## 2015-10-23 ENCOUNTER — Ambulatory Visit: Payer: BLUE CROSS/BLUE SHIELD

## 2015-10-23 ENCOUNTER — Other Ambulatory Visit (HOSPITAL_BASED_OUTPATIENT_CLINIC_OR_DEPARTMENT_OTHER): Payer: BLUE CROSS/BLUE SHIELD

## 2015-10-23 ENCOUNTER — Encounter: Payer: Self-pay | Admitting: Hematology

## 2015-10-23 ENCOUNTER — Other Ambulatory Visit: Payer: Self-pay | Admitting: *Deleted

## 2015-10-23 ENCOUNTER — Telehealth: Payer: Self-pay | Admitting: *Deleted

## 2015-10-23 ENCOUNTER — Ambulatory Visit (HOSPITAL_BASED_OUTPATIENT_CLINIC_OR_DEPARTMENT_OTHER): Payer: BLUE CROSS/BLUE SHIELD

## 2015-10-23 ENCOUNTER — Ambulatory Visit (HOSPITAL_BASED_OUTPATIENT_CLINIC_OR_DEPARTMENT_OTHER): Payer: BLUE CROSS/BLUE SHIELD | Admitting: Hematology

## 2015-10-23 VITALS — BP 151/81 | HR 88

## 2015-10-23 VITALS — BP 158/83 | HR 91 | Temp 98.5°F | Resp 18 | Ht 66.0 in | Wt 165.3 lb

## 2015-10-23 DIAGNOSIS — C189 Malignant neoplasm of colon, unspecified: Secondary | ICD-10-CM

## 2015-10-23 DIAGNOSIS — D561 Beta thalassemia: Secondary | ICD-10-CM | POA: Diagnosis not present

## 2015-10-23 DIAGNOSIS — Z5112 Encounter for antineoplastic immunotherapy: Secondary | ICD-10-CM

## 2015-10-23 DIAGNOSIS — R634 Abnormal weight loss: Secondary | ICD-10-CM

## 2015-10-23 DIAGNOSIS — Z5111 Encounter for antineoplastic chemotherapy: Secondary | ICD-10-CM | POA: Diagnosis not present

## 2015-10-23 DIAGNOSIS — C19 Malignant neoplasm of rectosigmoid junction: Secondary | ICD-10-CM

## 2015-10-23 DIAGNOSIS — C787 Secondary malignant neoplasm of liver and intrahepatic bile duct: Secondary | ICD-10-CM

## 2015-10-23 DIAGNOSIS — E46 Unspecified protein-calorie malnutrition: Secondary | ICD-10-CM

## 2015-10-23 DIAGNOSIS — D500 Iron deficiency anemia secondary to blood loss (chronic): Secondary | ICD-10-CM | POA: Diagnosis not present

## 2015-10-23 DIAGNOSIS — I10 Essential (primary) hypertension: Secondary | ICD-10-CM

## 2015-10-23 LAB — COMPREHENSIVE METABOLIC PANEL
ALBUMIN: 4.1 g/dL (ref 3.5–5.0)
ALK PHOS: 130 U/L (ref 40–150)
ALT: 10 U/L (ref 0–55)
AST: 16 U/L (ref 5–34)
Anion Gap: 10 mEq/L (ref 3–11)
BILIRUBIN TOTAL: 0.73 mg/dL (ref 0.20–1.20)
BUN: 6.7 mg/dL — ABNORMAL LOW (ref 7.0–26.0)
CALCIUM: 9.6 mg/dL (ref 8.4–10.4)
CO2: 26 mEq/L (ref 22–29)
Chloride: 105 mEq/L (ref 98–109)
Creatinine: 1 mg/dL (ref 0.6–1.1)
EGFR: 74 mL/min/{1.73_m2} — AB (ref >90)
GLUCOSE: 108 mg/dL (ref 70–140)
Potassium: 3.3 mEq/L — ABNORMAL LOW (ref 3.5–5.1)
SODIUM: 141 meq/L (ref 136–145)
TOTAL PROTEIN: 8 g/dL (ref 6.4–8.3)

## 2015-10-23 LAB — CBC WITH DIFFERENTIAL/PLATELET
BASO%: 0.2 % (ref 0.0–2.0)
BASOS ABS: 0 10*3/uL (ref 0.0–0.1)
EOS ABS: 0.1 10*3/uL (ref 0.0–0.5)
EOS%: 1.6 % (ref 0.0–7.0)
HEMATOCRIT: 39.5 % (ref 34.8–46.6)
HEMOGLOBIN: 12 g/dL (ref 11.6–15.9)
LYMPH%: 27.2 % (ref 14.0–49.7)
MCH: 23.5 pg — AB (ref 25.1–34.0)
MCHC: 30.4 g/dL — ABNORMAL LOW (ref 31.5–36.0)
MCV: 77.5 fL — AB (ref 79.5–101.0)
MONO#: 0.7 10*3/uL (ref 0.1–0.9)
MONO%: 11.8 % (ref 0.0–14.0)
NEUT#: 3.4 10*3/uL (ref 1.5–6.5)
NEUT%: 59.2 % (ref 38.4–76.8)
PLATELETS: 330 10*3/uL (ref 145–400)
RBC: 5.1 10*6/uL (ref 3.70–5.45)
RDW: 17.2 % — AB (ref 11.2–14.5)
WBC: 5.7 10*3/uL (ref 3.9–10.3)
lymph#: 1.5 10*3/uL (ref 0.9–3.3)

## 2015-10-23 LAB — UA PROTEIN, DIPSTICK - CHCC: Protein, ur: 100 mg/dL

## 2015-10-23 MED ORDER — SODIUM CHLORIDE 0.9 % IV SOLN
5.0000 mg/kg | Freq: Once | INTRAVENOUS | Status: AC
  Administered 2015-10-23: 350 mg via INTRAVENOUS
  Filled 2015-10-23: qty 14

## 2015-10-23 MED ORDER — LORAZEPAM 0.5 MG PO TABS: 0.5000 mg | ORAL_TABLET | Freq: Three times a day (TID) | ORAL | Status: DC

## 2015-10-23 MED ORDER — DEXAMETHASONE SODIUM PHOSPHATE 100 MG/10ML IJ SOLN
Freq: Once | INTRAMUSCULAR | Status: AC
  Administered 2015-10-23: 10:00:00 via INTRAVENOUS
  Filled 2015-10-23: qty 2

## 2015-10-23 MED ORDER — ATROPINE SULFATE 1 MG/ML IJ SOLN
INTRAMUSCULAR | Status: AC
  Filled 2015-10-23: qty 1

## 2015-10-23 MED ORDER — IRINOTECAN HCL CHEMO INJECTION 100 MG/5ML
180.0000 mg/m2 | Freq: Once | INTRAVENOUS | Status: AC
  Administered 2015-10-23: 330 mg via INTRAVENOUS
  Filled 2015-10-23: qty 5.5

## 2015-10-23 MED ORDER — SODIUM CHLORIDE 0.9 % IV SOLN
2400.0000 mg/m2 | INTRAVENOUS | Status: DC
  Administered 2015-10-23: 4400 mg via INTRAVENOUS
  Filled 2015-10-23: qty 88

## 2015-10-23 MED ORDER — PALONOSETRON HCL INJECTION 0.25 MG/5ML
0.2500 mg | Freq: Once | INTRAVENOUS | Status: AC
  Administered 2015-10-23: 0.25 mg via INTRAVENOUS

## 2015-10-23 MED ORDER — LORAZEPAM 2 MG/ML IJ SOLN
0.5000 mg | Freq: Once | INTRAMUSCULAR | Status: AC
  Administered 2015-10-23: 0.5 mg via INTRAVENOUS

## 2015-10-23 MED ORDER — PALONOSETRON HCL INJECTION 0.25 MG/5ML
INTRAVENOUS | Status: AC
  Filled 2015-10-23: qty 5

## 2015-10-23 MED ORDER — LORAZEPAM 2 MG/ML IJ SOLN
INTRAMUSCULAR | Status: AC
  Filled 2015-10-23: qty 1

## 2015-10-23 MED ORDER — ATROPINE SULFATE 1 MG/ML IJ SOLN
0.5000 mg | Freq: Once | INTRAMUSCULAR | Status: AC | PRN
  Administered 2015-10-23: 0.5 mg via INTRAVENOUS

## 2015-10-23 MED ORDER — FLUOROURACIL CHEMO INJECTION 2.5 GM/50ML
400.0000 mg/m2 | Freq: Once | INTRAVENOUS | Status: AC
  Administered 2015-10-23: 750 mg via INTRAVENOUS
  Filled 2015-10-23: qty 15

## 2015-10-23 MED ORDER — SODIUM CHLORIDE 0.9 % IV SOLN
Freq: Once | INTRAVENOUS | Status: AC
  Administered 2015-10-23: 10:00:00 via INTRAVENOUS

## 2015-10-23 MED ORDER — LEUCOVORIN CALCIUM INJECTION 350 MG
400.0000 mg/m2 | Freq: Once | INTRAVENOUS | Status: AC
  Administered 2015-10-23: 732 mg via INTRAVENOUS
  Filled 2015-10-23: qty 36.6

## 2015-10-23 NOTE — Progress Notes (Signed)
Ok to treat with urine protein 100 per Dr. Burr Medico. Patient verbalized understanding to take potassium every other day per Dr. Burr Medico. Patient complains of nausea while irinotecan and leucovorin infusing. Order given and carried for Ativan 0.5 mg IV push. Patient states nausea completely resolved.

## 2015-10-23 NOTE — Telephone Encounter (Signed)
Per staff message and POF I have scheduled appts. Advised scheduler of appts. JMW  

## 2015-10-23 NOTE — Progress Notes (Signed)
Pt saw Dr. Burr Medico prior to chemo today.  OK to treat with urine protein 100 as per md.

## 2015-10-23 NOTE — Progress Notes (Signed)
[place]  [phone]) [phone] [phone]) [phone]  Clinic follow up Note   Patient Care Team: Benito Mccreedy, MD as PCP - General (Internal Medicine) Juanita Craver, MD as Consulting Physician (Gastroenterology) Michael Boston, MD as Consulting Physician (General Surgery) Truitt Merle, MD as Consulting Physician (Oncology) 10/23/2015  CHIEF COMPLAINTS:  Follow up metastatic colon cancer      Oncology History   Metastatic colon cancer to liver   Staging form: Colon and Rectum, AJCC 7th Edition     Pathologic stage from 04/08/2015: T3, N0, M1 - Signed by Truitt Merle, MD on 04/22/2015  Presented to ER with intractable N/V; intermittent rectal bleeding X 3 months      Metastatic colon cancer to liver ([place])   04/03/2015 Procedure Colonoscopy showed a obstructing sigmoid rectal mass. Biopsy showed adenocarcinoma.   04/06/2015 Tumor Marker CEA=467.3 / CA19.9=1605   04/06/2015 Imaging CT chest, abdomen and pelvis with contrast showed sigmoid colon rectal mass, multiple (4) liver metastasis, with the largest 9.1 x 6.1 cm mass in the left lobe..   04/08/2015 Miscellaneous Foundation one genomic testing showed NRAS G60e and BRAF [id number] mutations   04/08/2015 Initial Diagnosis Metastatic colon cancer to liver   04/08/2015 Surgery Laparoscopic sigmoid colectomy, liver biopsy, port cath insertion, by Dr. Johney Maine    04/08/2015 Pathology Results Sigmoid colon segmental resection showed adenocarcinoma with mucinous features, pT 3, 15 lymph nodes all negative, surgical margins were negative. Liver biopsy showed metastatic adenocarcinoma.   05/08/2015 -  Chemotherapy FOLFIRI every 2 weeks, Avastin was added on from second cycle    07/14/2015 Imaging CT abdomen and pelvis showed interval slight improvement in liver metastasis. No other new lesions.     HISTORY OF PRESENTING ILLNESS:  Christina [name] 55 y.o. female with past medical history of endometriosis, uterine fibroids, thalassemia trait,  iron deficient anemia, who was recently found to have a sigmoid rectal cancer. I initially saw her in the hospital, she is here for the first follow-up.  She has been having intermittent rectal bleeding for the past 3 months. She thought it was related to her endometriosis, did not seek medical attention. She also has intermittent mild nausea, especially in the morning, and left lower quadrant abdominal pain. She was found to have profound anemia with hemoglobin 6.5 last week, and received blood transfusion. She was referred to gastroenterologist Dr. Collene Mares last week, underwent colonoscopy 2 days ago, which showed a obstructing sigmoid rectal mass, per patient, the colonoscopy report is not available today. Biopsy was done, but the pathology report is still pending.  She developed severe nausea, and vomited several times with clear gastric liquid. She called [name], and I was told to come to [facility] emergency room by on-call physician Dr. Carlean Purl. She had a CT scan done in the emergency room today.  Her appetite was fairly normal up to 2 days ago before recent hospital admission, when she was found to have a colorectal mass. She lost about 45 pounds in the past few weeks. 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 family history was positive for colon cancer in her maternal cousin. She is married, lives with her husband, no children.  CURRENT THERAPY: FOLFIRI and Avastin every 2 weeks, started on 05/08/2015, dose reduction and neulasta added from cycle 7  INTERIM HISTORY [name] returns for follow-up and chemo. She is doing very well overall. Her only issue is chemotherapy was mild nausea and she vomited once  a day for 3 days after last cycle chemotherapy, she took a Compazine and is switched to Zofran 3 days later. No other noticeable side effects. She has good appetite and eating very well, good energy level, she was able to do calls for  the hospital and had avery busy working days. She had one episode of right lower quadrant abdominal pain, which she contributes to ovary cyst burst, resolved spontaneously. Her previous epigastric in the right upper quadrant pain has resolved, no other pain, dyspnea, or other symptoms. Her weight has been stable.  MEDICAL HISTORY:  Past Medical History  Diagnosis Date  . Hypertension   . Endometriosis   . Thalassanemia   . met colon ca to liver dx'd 03/2015    SURGICAL HISTORY: Past Surgical History  Procedure Laterality Date  . Myomectomy      Gyn in [place]  . Diagnostic laparoscopy      Endometriosis  . Colon resection N/A 04/08/2015    Procedure: LAPAROSCOPIC  RESECTION OF PART OF  SIGMOID COLON;  Surgeon: Michael Boston, MD;  Location: [facility];  Service: General;  Laterality: N/A;  . Liver biopsy N/A 04/08/2015    Procedure: CORE NEEDLE LIVER BIOPSY;  Surgeon: Michael Boston, MD;  Location: [facility];  Service: General;  Laterality: N/A;  . Portacath placement N/A 04/08/2015    Procedure: INSERTION PORT-A-CATH;  Surgeon: Michael Boston, MD;  Location: [facility];  Service: General;  Laterality: N/A;  . Laparoscopic sigmoid colectomy  04/08/2015    for colorectal cancer    SOCIAL HISTORY: Social History   Social History  . Marital Status: Married    Spouse Name: N/A  . Number of Children: N/A  . Years of Education: N/A   Occupational History  . Internal Medicine doctor     Works in [employer]  . Smoking status: Never Smoker   . Smokeless tobacco: Not on file  . Alcohol Use: Yes     Comment: socail drinker   . Drug Use: No  . Sexual Activity: Not on file   Other Topics Concern  . Not on file   Social History Narrative   Married, husband [name] (married X 2 years)   No children   IM Physician in [place]: Family History  Problem Relation Age of Onset  . Anesthesia problems Cousin 33    maternal cousin, colon  cancer   . Clotting disorder Maternal Grandmother     ALLERGIES:  is allergic to lactose intolerance (gi); milk-related compounds; and nsaids.  MEDICATIONS:  Current Outpatient Prescriptions  Medication Sig Dispense Refill  . dexamethasone (DECADRON) 4 MG tablet Take 1 tablet (4 mg total) by mouth 2 (two) times daily with a meal. 10 tablet 1  . lidocaine-prilocaine (EMLA) cream Apply 1 application topically as needed. 30 g 6  . NIFEdipine (PROCARDIA-XL/ADALAT CC) 30 MG 24 hr tablet Take 30 mg by mouth daily.    . ondansetron (ZOFRAN) 4 MG tablet Take 1 tablet (4 mg total) by mouth every 6 (six) hours as needed for nausea. 20 tablet 0  . oxyCODONE (OXY IR/ROXICODONE) 5 MG immediate release tablet Take 1 tablet (5 mg total) by mouth every 6 (six) hours as needed for moderate pain. 50 tablet 0  . potassium chloride SA (K-DUR,KLOR-CON) 20 MEQ tablet Take 1 tablet (20 mEq total) by mouth 2 (two) times daily. 20 tablet 1  . prochlorperazine (COMPAZINE) 10 MG tablet Take 1 tablet (  10 mg total) by mouth every 6 (six) hours as needed for nausea or vomiting. 30 tablet 1   No current facility-administered medications for this visit.    REVIEW OF SYSTEMS:   Constitutional: Denies fevers, chills or abnormal night sweats, (+) fatigue Eyes: Denies blurriness of vision, double vision or watery eyes Ears, nose, mouth, throat, and face: Denies mucositis or sore throat Respiratory: Denies cough, dyspnea or wheezes Cardiovascular: Denies palpitation, chest discomfort or lower extremity swelling Gastrointestinal:  (+) nausea, (+) RUQ and rectal pain, and mild constipation Skin: Denies abnormal skin rashes Lymphatics: Denies new lymphadenopathy or easy bruising Neurological:Denies numbness, tingling or new weaknesses Behavioral/Psych: Mood is stable, no new changes  All other systems were reviewed with the patient and are negative.  PHYSICAL EXAMINATION: ECOG PERFORMANCE STATUS: 0  Filed Vitals:    10/23/15 0832  BP: 158/83  Pulse: 91  Temp: 98.5 F (36.9 C)  Resp: 18   Filed Weights   10/23/15 0832  Weight: 165 lb 4.8 oz (74.98 kg)    GENERAL:alert, no distress and comfortable SKIN: skin color, texture, turgor are normal, no rashes or significant lesions EYES: normal, conjunctiva are pink and non-injected, sclera clear OROPHARYNX:no exudate, no erythema and lips, buccal mucosa, and tongue normal  NECK: supple, thyroid normal size, non-tender, without nodularity LYMPH:  no palpable lymphadenopathy in the cervical, axillary or inguinal LUNGS: clear to auscultation and percussion with normal breathing effort HEART: regular rate & rhythm and no murmurs and no lower extremity edema ABDOMEN:abdomen soft, non-tender and normal bowel sounds Musculoskeletal:no cyanosis of digits and no clubbing  PSYCH: alert & oriented x 3 with fluent speech NEURO: no focal motor/sensory deficits  LABORATORY DATA:  I have reviewed the data as listed CBC Latest Ref Rng 10/23/2015 10/09/2015 09/25/2015  WBC 3.9 - 10.3 10e3/uL 5.7 11.2(H) 8.8  Hemoglobin 11.6 - 15.9 g/dL 12.0 12.3 11.7  Hematocrit 34.8 - 46.6 % 39.5 39.9 38.5  Platelets 145 - 400 10e3/uL 330 355 351    CMP Latest Ref Rng 10/23/2015 10/09/2015 09/25/2015  Glucose 70 - 140 mg/dl 108 106 111  BUN 7.0 - 26.0 mg/dL 6.7(L) 11.9 11.7  Creatinine 0.6 - 1.1 mg/dL 1.0 1.1 1.0  Sodium 136 - 145 mEq/L 141 141 141  Potassium 3.5 - 5.1 mEq/L 3.3(L) 3.6 3.7  CO2 22 - 29 mEq/L _0 Calcium 8.4 - 10.4 mg/dL 9.6 9.8 9.7  Total Protein 6.4 - 8.3 g/dL 8.0 8.2 8.1  Total Bilirubin 0.20 - 1.20 mg/dL 0.73 0.75 0.74  Alkaline Phos 40 - 150 U/L 130 153(H) 157(H)  AST 5 - 34 U/L _1 ALT 0 - 55 U/L _2 CEA (Parallel Testing)  Status: Finalresult Visible to patient:  MyChart Nextappt: Today at 08:00 AM in Oncology [facility])              Ref Range 2wk ago  83moago  263mogo     CEA ng/mL 453.6 (H) 430.0  (H)R, CM 442.6 (H)R, CM          Diagnosis 04/08/2015 1. Liver, needle/core biopsy, ? cancer - METASTATIC ADENOCARCINOMA. 2. Colon, segmental resection for tumor, sigmoid colon mass open end proximal - COLONIC ADENOCARCINOMA WITH MUCINOUS FEATURES EXTENDING INTO PERICOLONIC ADIPOSE TISSUE AND SUBSEROSAL CONNECTIVE TISSUE. - MARGINS NOT INVOLVED. - FIFTEEN BENIGN LYMPH NODES (0/15). 3. Colon, resection margin (donut), distal anastomic ring - BENIGN COLON. - NO EVIDENCE OF MALIGNANCY.  Microscopic Comment  Specimen: Sigmoid colon with liver biopsy and anastomotic rings. Procedure: Segmental resection with liver biopsy. Tumor site: Distal sigmoid. Specimen integrity: Intact. Macroscopic intactness of mesorectum: N/A Macroscopic tumor perforation: No. Invasive tumor: Maximum size: 8 cm Histologic type(s): Colorectal adenocarcinoma with mucinous features. Histologic grade and differentiation: G2: moderately differentiated/low grade Type of polyp in which invasive carcinoma arose: No residual polyp. Microscopic extension of invasive tumor: Into pericolonic adipose tissue and subserosal connective tissue. Lymph-Vascular invasion: Present. Peri-neural invasion: Present. Tumor deposit(s) (discontinuous extramural extension): No. Resection margins: Proximal margin: Free of tumor. Distal margin: Free of tumor. Circumferential (radial) (posterior ascending, posterior descending; lateral and posterior mid-rectum; and entire lower 1/3 rectum): N/A Mesenteric margin (sigmoid and transverse): Free of tumor. Distance closest margin (if all above margins negative): 2 cm from mesenteric margin. Treatment effect (neo-adjuvant therapy): No. Additional polyp(s): No. Non-neoplastic findings: None. Lymph nodes: number examined - 15; number positive: 0 Pathologic Staging: pT3, pN0, pM1 Ancillary studies: MMR pending. (JDP:gt, 04/10/15) 2. Mismatch Repair (MMR) Protein Immunohistochemistry  (IHC) IHC Expression Result: MLH1: Preserved nuclear expression (greater 50% tumor expression) MSH2: Preserved nuclear expression (greater 50% tumor expression) MSH6: Preserved nuclear expression (greater 50% tumor expression) PMS2: Preserved nuclear expression (greater 50% tumor expression) * Internal control demonstrates intact nuclear expression Interpretation: NORMAL   FoundationOne test result   RADIOGRAPHIC STUDIES: I have personally reviewed the radiological images as listed and agreed with the findings in the report.  Ct abdomen and pelvis W Contrast 10/12/2015 IMPRESSION: 1. Continued interval decrease in size of multiple hepatic metastases. 2. The normal postsurgical appearance of the sigmoid anastomosis. No evidence for residual or locally recurrent disease. 3. Multiple uterine fibroids including a very large left-sided exophytic lesion.  ASSESSMENT & PLAN:  55 year old African-American female, with past medical history of hypertension, thalassemia trait, iron deficient anemia, uterine fibroids, endometriosis, who presents with intermittent rectal bleeding, mild nausea, and mild intermittent left lower quadrant abdominal pain.   1. Sigmoid rectal adenocarcinoma, with liver metastases, pT3N0M1, stage IV, MMR normal, NRAS and BRAF mutation (+)  -I reviewed her surgical pathology findings with her in great details. Her liver biopsy confirmed metastatic adenocarcinoma  From colon cancer. Her primary sigmoid rectal tumor has been completely removed. -We again reviewed her CT findings, it showed at least 4 liver metastatic lesion, with the largest one measuring 9.1 cm in the left lobe. No other distant metastasis -We reviewed the natural history of metastatic colon cancer, and incurable nature at this is stage, giving her large metastatic disease in the liver. We also discussed that if she had excellent response to chemotherapy, I'll present her restaging CT scan in our GI tumor  Board, to see if she is a candidate for liver surgery or liver targeted therapy to maximally controlled her disease.  -Her tumor has BRAF mutation, which is a poor prognostic factor  -Her tumor has NRAS mutation, not a candidate for EGFR inhibitor  -Given the MSI-stable, she might not benefit from immunotherapy, except in a clinically trial setting  -I reviewed her restaging CT scan from 10/12/2015, which showed continuous improvement in liver mets, no other new lesions, she has had a good partial response so far. She is clinically doing very well, tolerating treatment well, will continue current therapy. -lab reviewed, adequate for treatment, will proceed chemo today. Due to her prolonged neutropenia, I have reduce her 5-FU dose slightly and added Neulasta from cycle 7.   2. Iron deficient Anemia, and a beta thalassemia trait  -Secondary to GI  bleeding and iron deficiency -Ferritin level was 8, consistent with iron deficiency from GI bleeding -She received Feraheme 510 mg twice,  responded very well. Her anemia resolved, even she is getting chemotherapy.   4 RUQ abdominal pain -near resolved now.   -She takes oxycodone once every 3-4 days  5. Weight loss and malnutrition - improved,her weight has been stable lately, and gained a few pounds  -continue neutritional supplement  -follow up with dietician   6. Hypertension -She has no history of hypertension. Blood pressure has been slightly elevated in the past months, repeated normal. Possibly related to Avastin -I'll continue monitoring her blood pressure closely. Consider MMR being if persistent high.   Plan -I reviewed her CT scan findings and lab results. -Her urine protein is 100, okay to continue Avastin for now, we'll continue monitor closely.  -cycle 12 FOLFIRI and avastin today and continue every 2 weeks, with Neulasta on day 3 -I'll see her back in 4 weeks.  All questions were answered. The patient knows to call the clinic with  any problems, questions or concerns. I spent 25 minutes counseling the patient face to face. The total time spent in the appointment was 30 minutes and more than 50% was on counseling.     Truitt Merle, MD 10/23/2015

## 2015-10-23 NOTE — Patient Instructions (Signed)
[facility] Discharge Instructions for Patients Receiving Chemotherapy  Today you received the following chemotherapy agents Avastin/ 5 Fu/ Leucovorin/ Irinotecan To help prevent nausea and vomiting after your treatment, we encourage you to take your nausea medication as prescribed.  If you develop nausea and vomiting that is not controlled by your nausea medication, call the clinic.   BELOW ARE SYMPTOMS THAT SHOULD BE REPORTED IMMEDIATELY:  *FEVER GREATER THAN 100.5 F  *CHILLS WITH OR WITHOUT FEVER  NAUSEA AND VOMITING THAT IS NOT CONTROLLED WITH YOUR NAUSEA MEDICATION  *UNUSUAL SHORTNESS OF BREATH  *UNUSUAL BRUISING OR BLEEDING  TENDERNESS IN MOUTH AND THROAT WITH OR WITHOUT PRESENCE OF ULCERS  *URINARY PROBLEMS  *BOWEL PROBLEMS  UNUSUAL RASH Items with * indicate a potential emergency and should be followed up as soon as possible.  Feel free to call the clinic you have any questions or concerns. The clinic phone number is [phone].  Please show the [place] at check-in to the Emergency Department and triage nurse.

## 2015-10-23 NOTE — Addendum Note (Signed)
Addended by: Truitt Merle on: 10/23/2015 11:59 AM   Modules accepted: Orders

## 2015-10-25 ENCOUNTER — Ambulatory Visit (HOSPITAL_BASED_OUTPATIENT_CLINIC_OR_DEPARTMENT_OTHER): Payer: BLUE CROSS/BLUE SHIELD

## 2015-10-25 ENCOUNTER — Ambulatory Visit: Payer: BLUE CROSS/BLUE SHIELD

## 2015-10-25 VITALS — BP 123/82 | HR 91 | Temp 98.9°F

## 2015-10-25 DIAGNOSIS — C189 Malignant neoplasm of colon, unspecified: Secondary | ICD-10-CM

## 2015-10-25 DIAGNOSIS — C787 Secondary malignant neoplasm of liver and intrahepatic bile duct: Principal | ICD-10-CM

## 2015-10-25 DIAGNOSIS — C19 Malignant neoplasm of rectosigmoid junction: Secondary | ICD-10-CM | POA: Diagnosis not present

## 2015-10-25 DIAGNOSIS — Z5189 Encounter for other specified aftercare: Secondary | ICD-10-CM

## 2015-10-25 MED ORDER — PEGFILGRASTIM INJECTION 6 MG/0.6ML ~~LOC~~
6.0000 mg | PREFILLED_SYRINGE | Freq: Once | SUBCUTANEOUS | Status: AC
  Administered 2015-10-25: 6 mg via SUBCUTANEOUS

## 2015-10-25 MED ORDER — SODIUM CHLORIDE 0.9 % IJ SOLN
10.0000 mL | INTRAMUSCULAR | Status: DC | PRN
  Administered 2015-10-25: 10 mL
  Filled 2015-10-25: qty 10

## 2015-10-25 MED ORDER — HEPARIN SOD (PORK) LOCK FLUSH 100 UNIT/ML IV SOLN
500.0000 [IU] | Freq: Once | INTRAVENOUS | Status: AC | PRN
  Administered 2015-10-25: 500 [IU]
  Filled 2015-10-25: qty 5

## 2015-11-06 ENCOUNTER — Ambulatory Visit (HOSPITAL_BASED_OUTPATIENT_CLINIC_OR_DEPARTMENT_OTHER): Payer: BLUE CROSS/BLUE SHIELD

## 2015-11-06 ENCOUNTER — Other Ambulatory Visit (HOSPITAL_BASED_OUTPATIENT_CLINIC_OR_DEPARTMENT_OTHER): Payer: BLUE CROSS/BLUE SHIELD

## 2015-11-06 VITALS — BP 145/80 | HR 84 | Temp 98.0°F

## 2015-11-06 DIAGNOSIS — C19 Malignant neoplasm of rectosigmoid junction: Secondary | ICD-10-CM

## 2015-11-06 DIAGNOSIS — C787 Secondary malignant neoplasm of liver and intrahepatic bile duct: Secondary | ICD-10-CM

## 2015-11-06 DIAGNOSIS — C189 Malignant neoplasm of colon, unspecified: Secondary | ICD-10-CM

## 2015-11-06 DIAGNOSIS — Z5112 Encounter for antineoplastic immunotherapy: Secondary | ICD-10-CM | POA: Diagnosis not present

## 2015-11-06 DIAGNOSIS — Z5111 Encounter for antineoplastic chemotherapy: Secondary | ICD-10-CM | POA: Diagnosis not present

## 2015-11-06 LAB — COMPREHENSIVE METABOLIC PANEL
ALBUMIN: 3.9 g/dL (ref 3.5–5.0)
ALK PHOS: 153 U/L — AB (ref 40–150)
ALT: 9 U/L (ref 0–55)
AST: 17 U/L (ref 5–34)
Anion Gap: 8 mEq/L (ref 3–11)
BUN: 12.6 mg/dL (ref 7.0–26.0)
CALCIUM: 9.6 mg/dL (ref 8.4–10.4)
CO2: 27 mEq/L (ref 22–29)
CREATININE: 1.1 mg/dL (ref 0.6–1.1)
Chloride: 106 mEq/L (ref 98–109)
EGFR: 68 mL/min/{1.73_m2} — ABNORMAL LOW (ref >90)
GLUCOSE: 94 mg/dL (ref 70–140)
Potassium: 4 mEq/L (ref 3.5–5.1)
Sodium: 141 mEq/L (ref 136–145)
TOTAL PROTEIN: 7.8 g/dL (ref 6.4–8.3)
Total Bilirubin: 0.6 mg/dL (ref 0.20–1.20)

## 2015-11-06 LAB — CBC WITH DIFFERENTIAL/PLATELET
BASO%: 0.3 % (ref 0.0–2.0)
Basophils Absolute: 0 10*3/uL (ref 0.0–0.1)
EOS ABS: 0.1 10*3/uL (ref 0.0–0.5)
EOS%: 1.2 % (ref 0.0–7.0)
HCT: 39.6 % (ref 34.8–46.6)
HEMOGLOBIN: 12 g/dL (ref 11.6–15.9)
LYMPH%: 20.3 % (ref 14.0–49.7)
MCH: 23.5 pg — ABNORMAL LOW (ref 25.1–34.0)
MCHC: 30.3 g/dL — ABNORMAL LOW (ref 31.5–36.0)
MCV: 77.6 fL — ABNORMAL LOW (ref 79.5–101.0)
MONO#: 0.9 10*3/uL (ref 0.1–0.9)
MONO%: 9.3 % (ref 0.0–14.0)
NEUT%: 68.9 % (ref 38.4–76.8)
NEUTROS ABS: 6.9 10*3/uL — AB (ref 1.5–6.5)
Platelets: 340 10*3/uL (ref 145–400)
RBC: 5.1 10*6/uL (ref 3.70–5.45)
RDW: 17.7 % — AB (ref 11.2–14.5)
WBC: 10 10*3/uL (ref 3.9–10.3)
lymph#: 2 10*3/uL (ref 0.9–3.3)

## 2015-11-06 LAB — UA PROTEIN, DIPSTICK - CHCC: Protein, ur: <30 mg/dL

## 2015-11-06 MED ORDER — PALONOSETRON HCL INJECTION 0.25 MG/5ML
0.2500 mg | Freq: Once | INTRAVENOUS | Status: AC
  Administered 2015-11-06: 0.25 mg via INTRAVENOUS

## 2015-11-06 MED ORDER — SODIUM CHLORIDE 0.9 % IV SOLN
Freq: Once | INTRAVENOUS | Status: AC
  Administered 2015-11-06: 10:00:00 via INTRAVENOUS
  Filled 2015-11-06: qty 5

## 2015-11-06 MED ORDER — IRINOTECAN HCL CHEMO INJECTION 100 MG/5ML
180.0000 mg/m2 | Freq: Once | INTRAVENOUS | Status: AC
  Administered 2015-11-06: 330 mg via INTRAVENOUS
  Filled 2015-11-06: qty 5.5

## 2015-11-06 MED ORDER — FLUOROURACIL CHEMO INJECTION 2.5 GM/50ML
400.0000 mg/m2 | Freq: Once | INTRAVENOUS | Status: AC
  Administered 2015-11-06: 750 mg via INTRAVENOUS
  Filled 2015-11-06: qty 15

## 2015-11-06 MED ORDER — SODIUM CHLORIDE 0.9 % IV SOLN
5.0000 mg/kg | Freq: Once | INTRAVENOUS | Status: AC
  Administered 2015-11-06: 350 mg via INTRAVENOUS
  Filled 2015-11-06: qty 14

## 2015-11-06 MED ORDER — SODIUM CHLORIDE 0.9 % IV SOLN: Freq: Once | INTRAVENOUS | Status: DC

## 2015-11-06 MED ORDER — SODIUM CHLORIDE 0.9 % IV SOLN
2400.0000 mg/m2 | INTRAVENOUS | Status: DC
  Administered 2015-11-06: 4400 mg via INTRAVENOUS
  Filled 2015-11-06: qty 88

## 2015-11-06 MED ORDER — HEPARIN SOD (PORK) LOCK FLUSH 100 UNIT/ML IV SOLN
500.0000 [IU] | Freq: Once | INTRAVENOUS | Status: DC | PRN
  Filled 2015-11-06: qty 5

## 2015-11-06 MED ORDER — ATROPINE SULFATE 1 MG/ML IJ SOLN
0.5000 mg | Freq: Once | INTRAMUSCULAR | Status: AC | PRN
  Administered 2015-11-06: 0.5 mg via INTRAVENOUS

## 2015-11-06 MED ORDER — ATROPINE SULFATE 1 MG/ML IJ SOLN
INTRAMUSCULAR | Status: AC
  Filled 2015-11-06: qty 1

## 2015-11-06 MED ORDER — SODIUM CHLORIDE 0.9 % IV SOLN
Freq: Once | INTRAVENOUS | Status: AC
  Administered 2015-11-06: 10:00:00 via INTRAVENOUS

## 2015-11-06 MED ORDER — SODIUM CHLORIDE 0.9 % IJ SOLN
10.0000 mL | INTRAMUSCULAR | Status: DC | PRN
Start: 2015-11-06 — End: 2015-11-06
  Filled 2015-11-06: qty 10

## 2015-11-06 MED ORDER — LEUCOVORIN CALCIUM INJECTION 350 MG
400.0000 mg/m2 | Freq: Once | INTRAVENOUS | Status: AC
  Administered 2015-11-06: 732 mg via INTRAVENOUS
  Filled 2015-11-06: qty 36.6

## 2015-11-06 MED ORDER — PALONOSETRON HCL INJECTION 0.25 MG/5ML
INTRAVENOUS | Status: AC
  Filled 2015-11-06: qty 5

## 2015-11-06 NOTE — Progress Notes (Signed)
Pt complained of 6/10 back pain. Pt took her home oxycodone (1 tab). Pt denies any nausea/vomiting at this time. This is her first time getting Christina [name] and states that she feels much better today.

## 2015-11-06 NOTE — Patient Instructions (Signed)
[facility] Discharge Instructions for Patients Receiving Chemotherapy  Today you received the following chemotherapy agents Avastin/ 5 Fu/ Leucovorin/ Irinotecan To help prevent nausea and vomiting after your treatment, we encourage you to take your nausea medication as prescribed.  If you develop nausea and vomiting that is not controlled by your nausea medication, call the clinic.   BELOW ARE SYMPTOMS THAT SHOULD BE REPORTED IMMEDIATELY:  *FEVER GREATER THAN 100.5 F  *CHILLS WITH OR WITHOUT FEVER  NAUSEA AND VOMITING THAT IS NOT CONTROLLED WITH YOUR NAUSEA MEDICATION  *UNUSUAL SHORTNESS OF BREATH  *UNUSUAL BRUISING OR BLEEDING  TENDERNESS IN MOUTH AND THROAT WITH OR WITHOUT PRESENCE OF ULCERS  *URINARY PROBLEMS  *BOWEL PROBLEMS  UNUSUAL RASH Items with * indicate a potential emergency and should be followed up as soon as possible.  Feel free to call the clinic you have any questions or concerns. The clinic phone number is [phone].  Please show the [facility] at check-in to the Emergency Department and triage nurse.

## 2015-11-07 LAB — CEA: CEA1: 385.6 ng/mL — AB (ref 0.0–4.7)

## 2015-11-08 ENCOUNTER — Ambulatory Visit: Payer: BLUE CROSS/BLUE SHIELD

## 2015-11-08 ENCOUNTER — Ambulatory Visit (HOSPITAL_BASED_OUTPATIENT_CLINIC_OR_DEPARTMENT_OTHER): Payer: BLUE CROSS/BLUE SHIELD

## 2015-11-08 VITALS — BP 136/79 | HR 89 | Temp 98.7°F

## 2015-11-08 DIAGNOSIS — Z5189 Encounter for other specified aftercare: Secondary | ICD-10-CM | POA: Diagnosis not present

## 2015-11-08 DIAGNOSIS — C19 Malignant neoplasm of rectosigmoid junction: Secondary | ICD-10-CM | POA: Diagnosis not present

## 2015-11-08 DIAGNOSIS — C787 Secondary malignant neoplasm of liver and intrahepatic bile duct: Secondary | ICD-10-CM

## 2015-11-08 DIAGNOSIS — C189 Malignant neoplasm of colon, unspecified: Secondary | ICD-10-CM

## 2015-11-08 MED ORDER — PEGFILGRASTIM INJECTION 6 MG/0.6ML ~~LOC~~
6.0000 mg | PREFILLED_SYRINGE | Freq: Once | SUBCUTANEOUS | Status: AC
  Administered 2015-11-08: 6 mg via SUBCUTANEOUS

## 2015-11-08 MED ORDER — SODIUM CHLORIDE 0.9 % IJ SOLN
10.0000 mL | INTRAMUSCULAR | Status: DC | PRN
  Administered 2015-11-08: 10 mL
  Filled 2015-11-08: qty 10

## 2015-11-08 MED ORDER — HEPARIN SOD (PORK) LOCK FLUSH 100 UNIT/ML IV SOLN
500.0000 [IU] | Freq: Once | INTRAVENOUS | Status: AC | PRN
  Administered 2015-11-08: 500 [IU]
  Filled 2015-11-08: qty 5

## 2015-11-08 NOTE — Progress Notes (Signed)
Duplicate appointment- see injection encounter for same date.

## 2015-11-20 ENCOUNTER — Ambulatory Visit (HOSPITAL_BASED_OUTPATIENT_CLINIC_OR_DEPARTMENT_OTHER): Payer: BLUE CROSS/BLUE SHIELD | Admitting: Hematology

## 2015-11-20 ENCOUNTER — Other Ambulatory Visit (HOSPITAL_BASED_OUTPATIENT_CLINIC_OR_DEPARTMENT_OTHER): Payer: BLUE CROSS/BLUE SHIELD

## 2015-11-20 ENCOUNTER — Ambulatory Visit (HOSPITAL_BASED_OUTPATIENT_CLINIC_OR_DEPARTMENT_OTHER): Payer: BLUE CROSS/BLUE SHIELD

## 2015-11-20 ENCOUNTER — Telehealth: Payer: Self-pay | Admitting: *Deleted

## 2015-11-20 ENCOUNTER — Telehealth: Payer: Self-pay | Admitting: Hematology

## 2015-11-20 ENCOUNTER — Encounter: Payer: Self-pay | Admitting: Hematology

## 2015-11-20 VITALS — BP 145/91 | HR 92 | Temp 98.4°F | Resp 18 | Ht 66.0 in | Wt 169.9 lb

## 2015-11-20 VITALS — BP 143/80 | HR 85

## 2015-11-20 DIAGNOSIS — R1011 Right upper quadrant pain: Secondary | ICD-10-CM

## 2015-11-20 DIAGNOSIS — C189 Malignant neoplasm of colon, unspecified: Secondary | ICD-10-CM

## 2015-11-20 DIAGNOSIS — Z5112 Encounter for antineoplastic immunotherapy: Secondary | ICD-10-CM | POA: Diagnosis not present

## 2015-11-20 DIAGNOSIS — C19 Malignant neoplasm of rectosigmoid junction: Secondary | ICD-10-CM

## 2015-11-20 DIAGNOSIS — C787 Secondary malignant neoplasm of liver and intrahepatic bile duct: Secondary | ICD-10-CM

## 2015-11-20 DIAGNOSIS — I10 Essential (primary) hypertension: Secondary | ICD-10-CM

## 2015-11-20 DIAGNOSIS — Z5111 Encounter for antineoplastic chemotherapy: Secondary | ICD-10-CM | POA: Diagnosis not present

## 2015-11-20 DIAGNOSIS — D561 Beta thalassemia: Secondary | ICD-10-CM

## 2015-11-20 DIAGNOSIS — D509 Iron deficiency anemia, unspecified: Secondary | ICD-10-CM

## 2015-11-20 DIAGNOSIS — D569 Thalassemia, unspecified: Secondary | ICD-10-CM

## 2015-11-20 DIAGNOSIS — D500 Iron deficiency anemia secondary to blood loss (chronic): Secondary | ICD-10-CM

## 2015-11-20 LAB — COMPREHENSIVE METABOLIC PANEL
ALBUMIN: 4.1 g/dL (ref 3.5–5.0)
ALK PHOS: 161 U/L — AB (ref 40–150)
ALT: 12 U/L (ref 0–55)
AST: 20 U/L (ref 5–34)
Anion Gap: 10 mEq/L (ref 3–11)
BUN: 11.2 mg/dL (ref 7.0–26.0)
CALCIUM: 10.1 mg/dL (ref 8.4–10.4)
CO2: 27 mEq/L (ref 22–29)
Chloride: 106 mEq/L (ref 98–109)
Creatinine: 1 mg/dL (ref 0.6–1.1)
EGFR: 72 mL/min/{1.73_m2} — AB (ref >90)
Glucose: 113 mg/dl (ref 70–140)
POTASSIUM: 3.9 meq/L (ref 3.5–5.1)
Sodium: 143 mEq/L (ref 136–145)
Total Bilirubin: 0.43 mg/dL (ref 0.20–1.20)
Total Protein: 8.1 g/dL (ref 6.4–8.3)

## 2015-11-20 LAB — CBC WITH DIFFERENTIAL/PLATELET
BASO%: 0.2 % (ref 0.0–2.0)
BASOS ABS: 0 10*3/uL (ref 0.0–0.1)
EOS%: 0.7 % (ref 0.0–7.0)
Eosinophils Absolute: 0.1 10*3/uL (ref 0.0–0.5)
HEMATOCRIT: 40.1 % (ref 34.8–46.6)
HGB: 12.3 g/dL (ref 11.6–15.9)
LYMPH#: 2.3 10*3/uL (ref 0.9–3.3)
LYMPH%: 14.4 % (ref 14.0–49.7)
MCH: 23.6 pg — AB (ref 25.1–34.0)
MCHC: 30.7 g/dL — AB (ref 31.5–36.0)
MCV: 77 fL — ABNORMAL LOW (ref 79.5–101.0)
MONO#: 0.9 10*3/uL (ref 0.1–0.9)
MONO%: 5.3 % (ref 0.0–14.0)
NEUT#: 12.8 10*3/uL — ABNORMAL HIGH (ref 1.5–6.5)
NEUT%: 79.4 % — AB (ref 38.4–76.8)
PLATELETS: 312 10*3/uL (ref 145–400)
RBC: 5.21 10*6/uL (ref 3.70–5.45)
RDW: 18.4 % — ABNORMAL HIGH (ref 11.2–14.5)
WBC: 16.1 10*3/uL — ABNORMAL HIGH (ref 3.9–10.3)

## 2015-11-20 LAB — UA PROTEIN, DIPSTICK - CHCC: Protein, ur: NEGATIVE mg/dL

## 2015-11-20 MED ORDER — IRINOTECAN HCL CHEMO INJECTION 100 MG/5ML
180.0000 mg/m2 | Freq: Once | INTRAVENOUS | Status: AC
  Administered 2015-11-20: 330 mg via INTRAVENOUS
  Filled 2015-11-20: qty 5

## 2015-11-20 MED ORDER — FOSAPREPITANT DIMEGLUMINE INJECTION 150 MG
Freq: Once | INTRAVENOUS | Status: AC
  Administered 2015-11-20: 10:00:00 via INTRAVENOUS
  Filled 2015-11-20: qty 5

## 2015-11-20 MED ORDER — OXYCODONE HCL 5 MG PO TABS: 5.0000 mg | ORAL_TABLET | Freq: Four times a day (QID) | ORAL | Status: DC | PRN

## 2015-11-20 MED ORDER — SODIUM CHLORIDE 0.9 % IV SOLN
Freq: Once | INTRAVENOUS | Status: AC
  Administered 2015-11-20: 10:00:00 via INTRAVENOUS

## 2015-11-20 MED ORDER — ATROPINE SULFATE 1 MG/ML IJ SOLN
INTRAMUSCULAR | Status: AC
  Filled 2015-11-20: qty 1

## 2015-11-20 MED ORDER — PALONOSETRON HCL INJECTION 0.25 MG/5ML
INTRAVENOUS | Status: AC
  Filled 2015-11-20: qty 5

## 2015-11-20 MED ORDER — SODIUM CHLORIDE 0.9 % IV SOLN
5.0000 mg/kg | Freq: Once | INTRAVENOUS | Status: AC
  Administered 2015-11-20: 350 mg via INTRAVENOUS
  Filled 2015-11-20: qty 14

## 2015-11-20 MED ORDER — LIDOCAINE-PRILOCAINE 2.5-2.5 % EX CREA: 1.0000 "application " | TOPICAL_CREAM | CUTANEOUS | Status: DC | PRN

## 2015-11-20 MED ORDER — FLUOROURACIL CHEMO INJECTION 2.5 GM/50ML
400.0000 mg/m2 | Freq: Once | INTRAVENOUS | Status: AC
Start: 2015-11-20 — End: 2015-11-20
  Administered 2015-11-20: 750 mg via INTRAVENOUS
  Filled 2015-11-20: qty 15

## 2015-11-20 MED ORDER — LEUCOVORIN CALCIUM INJECTION 350 MG
400.0000 mg/m2 | Freq: Once | INTRAMUSCULAR | Status: AC
  Administered 2015-11-20: 732 mg via INTRAVENOUS
  Filled 2015-11-20: qty 36.6

## 2015-11-20 MED ORDER — SODIUM CHLORIDE 0.9 % IV SOLN
2400.0000 mg/m2 | INTRAVENOUS | Status: DC
  Administered 2015-11-20: 4400 mg via INTRAVENOUS
  Filled 2015-11-20: qty 88

## 2015-11-20 MED ORDER — ATROPINE SULFATE 1 MG/ML IJ SOLN
0.5000 mg | Freq: Once | INTRAMUSCULAR | Status: AC | PRN
  Administered 2015-11-20: 0.5 mg via INTRAVENOUS

## 2015-11-20 MED ORDER — PALONOSETRON HCL INJECTION 0.25 MG/5ML
0.2500 mg | Freq: Once | INTRAVENOUS | Status: AC
  Administered 2015-11-20: 0.25 mg via INTRAVENOUS

## 2015-11-20 NOTE — Progress Notes (Signed)
[place]  [phone] Fax:([phone]) [phone]  Clinic follow up Note   Patient Care Team: Christina Mccreedy, MD as PCP - General (Internal Medicine) Christina Craver, MD as Consulting Physician (Gastroenterology) Christina Boston, MD as Consulting Physician (General Surgery) Christina Merle, MD as Consulting Physician (Oncology) 11/20/2015  CHIEF COMPLAINTS:  Follow up metastatic colon cancer      Oncology History   Metastatic colon cancer to liver   Staging form: Colon and Rectum, AJCC 7th Edition     Pathologic stage from 04/08/2015: T3, N0, M1 - Signed by Christina Merle, MD on 04/22/2015  Presented to ER with intractable N/V; intermittent rectal bleeding X 3 months      Metastatic colon cancer to liver ([place])   04/03/2015 Procedure Colonoscopy showed a obstructing sigmoid rectal mass. Biopsy showed adenocarcinoma.   04/06/2015 Tumor Marker CEA=467.3 / CA19.9=1605   04/06/2015 Imaging CT chest, abdomen and pelvis with contrast showed sigmoid colon rectal mass, multiple (4) liver metastasis, with the largest 9.1 x 6.1 cm mass in the left lobe..   04/08/2015 Miscellaneous Foundation one genomic testing showed NRAS G60e and BRAF [id number] mutations   04/08/2015 Initial Diagnosis Metastatic colon cancer to liver   04/08/2015 Surgery Laparoscopic sigmoid colectomy, liver biopsy, port cath insertion, by Dr. Johney [name]    04/08/2015 Pathology Results Sigmoid colon segmental resection showed adenocarcinoma with mucinous features, pT 3, 15 lymph nodes all negative, surgical margins were negative. Liver biopsy showed metastatic adenocarcinoma.   05/08/2015 -  Chemotherapy FOLFIRI every 2 weeks, Avastin was added on from second cycle    07/14/2015 Imaging CT abdomen and pelvis showed interval slight improvement in liver metastasis. No other new lesions.   10/12/2015 Imaging  CT abdomen and pelvis with contrast showed continued interval decrease in size of multiple hepatic metastases,  no other new lesions.      HISTORY OF PRESENTING ILLNESS:  Christina [name] 55 y.o. female with past medical history of endometriosis, uterine fibroids, thalassemia trait, iron deficient anemia, who was recently found to have a sigmoid rectal cancer. I initially saw her in the hospital, she is here for the first follow-up.  She has been having intermittent rectal bleeding for the past 3 months. She thought it was related to her endometriosis, did not seek medical attention. She also has intermittent mild nausea, especially in the morning, and left lower quadrant abdominal pain. She was found to have profound anemia with hemoglobin 6.5 last week, and received blood transfusion. She was referred to gastroenterologist Dr. Collene [name] last week, underwent colonoscopy 2 days ago, which showed a obstructing sigmoid rectal mass, per patient, the colonoscopy report is not available today. Biopsy was done, but the pathology report is still pending.  She developed severe nausea, and vomited several times with clear gastric liquid. She called Dr. Lorie [name] office, and I was told to come to [facility] emergency room by on-call physician Dr. Carlean [name]. She had a CT scan done in the emergency room today.  Her appetite was fairly normal up to 2 days ago before recent hospital admission, when she was found to have a colorectal mass. She lost about 45 pounds in the past few weeks. 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 family history was positive for colon cancer in her maternal cousin. She is married, lives with her husband, no children.  CURRENT THERAPY: FOLFIRI and Avastin every 2 weeks, started on 05/08/2015, dose reduction and neulasta added from cycle 7  INTERIM  HISTORY Christina [name] returns for follow-up and chemo.  She is doing very well overall.  She  Has very good energy level, is very busy with her practice,  She is back to full time schedule (4 days a week)  And has been on call more  often lately. 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  She noticed some epigastric tenderness when she overdose work,  She takes oxycodone as needed for that which helps.  She has mild nausea after chemotherapy, for which she takes half tablet of Ativan. No other noticeable side effects from chemotherapy. She has very good appetite, eating well, she actually gained quite a bit of weight recently and has cut down her carbohydrate to lose some weight.  MEDICAL HISTORY:  Past Medical History  Diagnosis Date  . Hypertension   . Endometriosis   . Thalassanemia   . met colon ca to liver dx'd 03/2015    SURGICAL HISTORY: Past Surgical History  Procedure Laterality Date  . Myomectomy      Gyn in [place]  . Diagnostic laparoscopy      Endometriosis  . Colon resection N/A 04/08/2015    Procedure: LAPAROSCOPIC  RESECTION OF PART OF  SIGMOID COLON;  Surgeon: Christina Boston, MD;  Location: [facility];  Service: General;  Laterality: N/A;  . Liver biopsy N/A 04/08/2015    Procedure: CORE NEEDLE LIVER BIOPSY;  Surgeon: Christina Boston, MD;  Location: [facility];  Service: General;  Laterality: N/A;  . Portacath placement N/A 04/08/2015    Procedure: INSERTION PORT-A-CATH;  Surgeon: Christina Boston, MD;  Location: [facility];  Service: General;  Laterality: N/A;  . Laparoscopic sigmoid colectomy  04/08/2015    for colorectal cancer    SOCIAL HISTORY: Social History   Social History  . Marital Status: Married    Spouse Name: N/A  . Number of Children: N/A  . Years of Education: N/A   Occupational History  . Internal Medicine doctor     Works in [employer] Topics  . Smoking status: Never Smoker   . Smokeless tobacco: Not on file  . Alcohol Use: Yes     Comment: socail drinker   . Drug Use: No  . Sexual Activity: Not on file   Other Topics Concern  . Not on file   Social History Narrative   Married, husband Christina [name] (married X 2 years)    No children   IM Physician in [place]: Family History  Problem Relation Age of Onset  . Anesthesia problems Cousin 61    maternal cousin, colon cancer   . Clotting disorder Maternal Grandmother     ALLERGIES:  is allergic to lactose intolerance (gi); milk-related compounds; and nsaids.  MEDICATIONS:  Current Outpatient Prescriptions  Medication Sig Dispense Refill  . dexamethasone (DECADRON) 4 MG tablet Take 1 tablet (4 mg total) by mouth 2 (two) times daily with a meal. 10 tablet 1  . lidocaine-prilocaine (EMLA) cream Apply 1 application topically as needed. 30 g 6  . LORazepam (ATIVAN) 0.5 MG tablet Take 1 tablet (0.5 mg total) by mouth every 8 (eight) hours. Take 0.5 mg po every 8 hours as needed for nausea/vomiting. 30 tablet 0  . NIFEdipine (PROCARDIA-XL/ADALAT CC) 30 MG 24 hr tablet Take 30 mg by mouth daily.    . ondansetron (ZOFRAN) 4 MG tablet Take 1 tablet (4 mg total) by mouth every 6 (  six) hours as needed for nausea. 20 tablet 0  . oxyCODONE (OXY IR/ROXICODONE) 5 MG immediate release tablet Take 1 tablet (5 mg total) by mouth every 6 (six) hours as needed for moderate pain. 60 tablet 0  . potassium chloride SA (K-DUR,KLOR-CON) 20 MEQ tablet Take 1 tablet (20 mEq total) by mouth 2 (two) times daily. 20 tablet 1  . prochlorperazine (COMPAZINE) 10 MG tablet Take 1 tablet (10 mg total) by mouth every 6 (six) hours as needed for nausea or vomiting. 30 tablet 1   No current facility-administered medications for this visit.    REVIEW OF SYSTEMS:   Constitutional: Denies fevers, chills or abnormal night sweats, (+) fatigue Eyes: Denies blurriness of vision, double vision or watery eyes Ears, nose, mouth, throat, and face: Denies mucositis or sore throat Respiratory: Denies cough, dyspnea or wheezes Cardiovascular: Denies palpitation, chest discomfort or lower extremity swelling Gastrointestinal:  (+) nausea, (+) RUQ and rectal pain, and mild  constipation Skin: Denies abnormal skin rashes Lymphatics: Denies new lymphadenopathy or easy bruising Neurological:Denies numbness, tingling or new weaknesses Behavioral/Psych: Mood is stable, no new changes  All other systems were reviewed with the patient and are negative.  PHYSICAL EXAMINATION: ECOG PERFORMANCE STATUS: 0  Filed Vitals:   11/20/15 0905  BP: 145/91  Pulse: 92  Temp: 98.4 F (36.9 C)  Resp: 18   Filed Weights   11/20/15 0905  Weight: 169 lb 14.4 oz (77.066 kg)    GENERAL:alert, no distress and comfortable SKIN: skin color, texture, turgor are normal, no rashes or significant lesions EYES: normal, conjunctiva are pink and non-injected, sclera clear OROPHARYNX:no exudate, no erythema and lips, buccal mucosa, and tongue normal  NECK: supple, thyroid normal size, non-tender, without nodularity LYMPH:  no palpable lymphadenopathy in the cervical, axillary or inguinal LUNGS: clear to auscultation and percussion with normal breathing effort HEART: regular rate & rhythm and no murmurs and no lower extremity edema ABDOMEN:abdomen soft, non-tender and normal bowel sounds.  There is a small subcutaneous nodule in the epigastric area, slightly tender. Musculoskeletal:no cyanosis of digits and no clubbing  PSYCH: alert & oriented x 3 with fluent speech NEURO: no focal motor/sensory deficits  LABORATORY DATA:  I have reviewed the data as listed CBC Latest Ref Rng 11/20/2015 11/06/2015 10/23/2015  WBC 3.9 - 10.3 10e3/uL 16.1(H) 10.0 5.7  Hemoglobin 11.6 - 15.9 g/dL 12.3 12.0 12.0  Hematocrit 34.8 - 46.6 % 40.1 39.6 39.5  Platelets 145 - 400 10e3/uL 312 340 330    CMP Latest Ref Rng 11/06/2015 10/23/2015 10/09/2015  Glucose 70 - 140 mg/dl 94 108 106  BUN 7.0 - 26.0 mg/dL 12.6 6.7(L) 11.9  Creatinine 0.6 - 1.1 mg/dL 1.1 1.0 1.1  Sodium 136 - 145 mEq/L 141 141 141  Potassium 3.5 - 5.1 mEq/L 4.0 3.3(L) 3.6  CO2 22 - 29 mEq/L _0 Calcium 8.4 - 10.4 mg/dL 9.6 9.6 9.8   Total Protein 6.4 - 8.3 g/dL 7.8 8.0 8.2  Total Bilirubin 0.20 - 1.20 mg/dL 0.60 0.73 0.75  Alkaline Phos 40 - 150 U/L 153(H) 130 153(H)  AST 5 - 34 U/L _1 ALT 0 - 55 U/L _2 Diagnosis 04/08/2015 1. Liver, needle/core biopsy, ? cancer - METASTATIC ADENOCARCINOMA. 2. Colon, segmental resection for tumor, sigmoid colon mass open end proximal - COLONIC ADENOCARCINOMA WITH MUCINOUS FEATURES EXTENDING INTO PERICOLONIC ADIPOSE TISSUE AND SUBSEROSAL CONNECTIVE TISSUE. - MARGINS NOT INVOLVED. - FIFTEEN BENIGN LYMPH  NODES (0/15). 3. Colon, resection margin (donut), distal anastomic ring - BENIGN COLON. - NO EVIDENCE OF MALIGNANCY.  Microscopic Comment Specimen: Sigmoid colon with liver biopsy and anastomotic rings. Procedure: Segmental resection with liver biopsy. Tumor site: Distal sigmoid. Specimen integrity: Intact. Macroscopic intactness of mesorectum: N/A Macroscopic tumor perforation: No. Invasive tumor: Maximum size: 8 cm Histologic type(s): Colorectal adenocarcinoma with mucinous features. Histologic grade and differentiation: G2: moderately differentiated/low grade Type of polyp in which invasive carcinoma arose: No residual polyp. Microscopic extension of invasive tumor: Into pericolonic adipose tissue and subserosal connective tissue. Lymph-Vascular invasion: Present. Peri-neural invasion: Present. Tumor deposit(s) (discontinuous extramural extension): No. Resection margins: Proximal margin: Free of tumor. Distal margin: Free of tumor. Circumferential (radial) (posterior ascending, posterior descending; lateral and posterior mid-rectum; and entire lower 1/3 rectum): N/A Mesenteric margin (sigmoid and transverse): Free of tumor. Distance closest margin (if all above margins negative): 2 cm from mesenteric margin. Treatment effect (neo-adjuvant therapy): No. Additional polyp(s): No. Non-neoplastic findings: None. Lymph nodes: number examined - 15; number  positive: 0 Pathologic Staging: pT3, pN0, pM1 Ancillary studies: MMR pending. (JDP:gt, 04/10/15) 2. Mismatch Repair (MMR) Protein Immunohistochemistry (IHC) IHC Expression Result: MLH1: Preserved nuclear expression (greater 50% tumor expression) MSH2: Preserved nuclear expression (greater 50% tumor expression) MSH6: Preserved nuclear expression (greater 50% tumor expression) PMS2: Preserved nuclear expression (greater 50% tumor expression) * Internal control demonstrates intact nuclear expression Interpretation: NORMAL   FoundationOne test result   RADIOGRAPHIC STUDIES: I have personally reviewed the radiological images as listed and agreed with the findings in the report.  Ct abdomen and pelvis W Contrast 10/12/2015 IMPRESSION: 1. Continued interval decrease in size of multiple hepatic metastases. 2. The normal postsurgical appearance of the sigmoid anastomosis. No evidence for residual or locally recurrent disease. 3. Multiple uterine fibroids including a very large left-sided exophytic lesion.  ASSESSMENT & PLAN:  55 year old African-American female, with past medical history of hypertension, thalassemia trait, iron deficient anemia, uterine fibroids, endometriosis, who presents with intermittent rectal bleeding, mild nausea, and mild intermittent left lower quadrant abdominal pain.   1. Sigmoid rectal adenocarcinoma, with liver metastases, pT3N0M1, stage IV, MMR normal, NRAS and BRAF mutation (+)  -I reviewed her surgical pathology findings with her in great details. Her liver biopsy confirmed metastatic adenocarcinoma  From colon cancer. Her primary sigmoid rectal tumor has been completely removed. -We again reviewed her CT findings, it showed at least 4 liver metastatic lesion, with the largest one measuring 9.1 cm in the left lobe. No other distant metastasis -We reviewed the natural history of metastatic colon cancer, and incurable nature at this is stage, giving her large  metastatic disease in the liver. We also discussed that if she had excellent response to chemotherapy, I'll present her restaging CT scan in our GI tumor Board, to see if she is a candidate for liver surgery or liver targeted therapy to maximally controlled her disease.  -Her tumor has BRAF mutation, which is a poor prognostic factor  -Her tumor has NRAS mutation, not a candidate for EGFR inhibitor  -Given the MSI-stable, she might not benefit from immunotherapy, except in a clinically trial setting  -I reviewed her restaging CT scan from 10/12/2015, which showed continuous improvement in liver mets, no other new lesions, she has had a good partial response so far. She is clinically doing very well, tolerating treatment well, will continue current therapy. - her tumor marker CEA has been stable overall -lab reviewed, adequate for treatment, will proceed chemo today. Due to her  prolonged neutropenia, I have reduce her 5-FU dose slightly and added Neulasta from cycle 7.   2. Iron deficient Anemia, and beta thalassemia trait  -Secondary to GI bleeding and iron deficiency -Ferritin level was 8, consistent with iron deficiency from GI bleeding -She received Feraheme 510 mg twice,  responded very well. Her anemia resolved, even she is getting chemotherapy.   4 RUQ abdominal pain and epigastric tightness  -pain near resolved now,  She still has intermittent epigastric tenderness on exertion -She takes oxycodone once every  A few days  5. Hypertension -She has no history of hypertension. Blood pressure has been slightly elevated in the past months, repeated normal. Possibly related to Avastin -I'll continue monitoring her blood pressure closely. Consider adding amlodipine if persistent high.   Plan - FOLFIRI and avastin today and continue every 2 weeks, with Neulasta on day 3 -I'll see her back in 4 weeks. Will order restaging CT on next visit   All questions were answered. The patient knows to call  the clinic with any problems, questions or concerns. I spent 25 minutes counseling the patient face to face. The total time spent in the appointment was 30 minutes and more than 50% was on counseling.     Christina Merle, MD 11/20/2015

## 2015-11-20 NOTE — Telephone Encounter (Signed)
per pof to sch pt appt-sent MW email to sch trmt/d/c-pt to get updated copy b4 leaving

## 2015-11-20 NOTE — Telephone Encounter (Signed)
Per staff message and POF I have scheduled appts. Advised scheduler of appts. JMW  

## 2015-11-20 NOTE — Patient Instructions (Signed)
[facility] Discharge Instructions for Patients Receiving Chemotherapy  Today you received the following chemotherapy agents :  Avastin,  Camptosar,  Leucovorin,  Fluorouracil.  To help prevent nausea and vomiting after your treatment, we encourage you to take your nausea medication as prescribed.   If you develop nausea and vomiting that is not controlled by your nausea medication, call the clinic.   BELOW ARE SYMPTOMS THAT SHOULD BE REPORTED IMMEDIATELY:  *FEVER GREATER THAN 100.5 F  *CHILLS WITH OR WITHOUT FEVER  NAUSEA AND VOMITING THAT IS NOT CONTROLLED WITH YOUR NAUSEA MEDICATION  *UNUSUAL SHORTNESS OF BREATH  *UNUSUAL BRUISING OR BLEEDING  TENDERNESS IN MOUTH AND THROAT WITH OR WITHOUT PRESENCE OF ULCERS  *URINARY PROBLEMS  *BOWEL PROBLEMS  UNUSUAL RASH Items with * indicate a potential emergency and should be followed up as soon as possible.  Feel free to call the clinic you have any questions or concerns. The clinic phone number is [phone].  Please show the CHEMO ALERT CARD at check-in to the Emergency Department and triage nurse.   

## 2015-11-22 ENCOUNTER — Ambulatory Visit: Payer: BLUE CROSS/BLUE SHIELD

## 2015-11-22 ENCOUNTER — Ambulatory Visit (HOSPITAL_BASED_OUTPATIENT_CLINIC_OR_DEPARTMENT_OTHER): Payer: BLUE CROSS/BLUE SHIELD

## 2015-11-22 VITALS — BP 153/90 | HR 78 | Temp 98.5°F

## 2015-11-22 DIAGNOSIS — C19 Malignant neoplasm of rectosigmoid junction: Secondary | ICD-10-CM

## 2015-11-22 DIAGNOSIS — C787 Secondary malignant neoplasm of liver and intrahepatic bile duct: Secondary | ICD-10-CM

## 2015-11-22 DIAGNOSIS — C189 Malignant neoplasm of colon, unspecified: Secondary | ICD-10-CM

## 2015-11-22 MED ORDER — SODIUM CHLORIDE 0.9 % IJ SOLN
10.0000 mL | INTRAMUSCULAR | Status: DC | PRN
  Administered 2015-11-22: 10 mL
  Filled 2015-11-22: qty 10

## 2015-11-22 MED ORDER — HEPARIN SOD (PORK) LOCK FLUSH 100 UNIT/ML IV SOLN
500.0000 [IU] | Freq: Once | INTRAVENOUS | Status: AC | PRN
  Administered 2015-11-22: 500 [IU]
  Filled 2015-11-22: qty 5

## 2015-11-22 MED ORDER — PEGFILGRASTIM INJECTION 6 MG/0.6ML ~~LOC~~
6.0000 mg | PREFILLED_SYRINGE | Freq: Once | SUBCUTANEOUS | Status: AC
  Administered 2015-11-22: 6 mg via SUBCUTANEOUS

## 2015-12-04 ENCOUNTER — Ambulatory Visit (HOSPITAL_BASED_OUTPATIENT_CLINIC_OR_DEPARTMENT_OTHER): Payer: BLUE CROSS/BLUE SHIELD

## 2015-12-04 ENCOUNTER — Other Ambulatory Visit: Payer: Self-pay | Admitting: Hematology

## 2015-12-04 ENCOUNTER — Other Ambulatory Visit (HOSPITAL_BASED_OUTPATIENT_CLINIC_OR_DEPARTMENT_OTHER): Payer: BLUE CROSS/BLUE SHIELD

## 2015-12-04 VITALS — BP 141/71 | HR 83 | Temp 98.5°F | Resp 18

## 2015-12-04 DIAGNOSIS — Z5112 Encounter for antineoplastic immunotherapy: Secondary | ICD-10-CM

## 2015-12-04 DIAGNOSIS — C787 Secondary malignant neoplasm of liver and intrahepatic bile duct: Secondary | ICD-10-CM | POA: Diagnosis not present

## 2015-12-04 DIAGNOSIS — Z5111 Encounter for antineoplastic chemotherapy: Secondary | ICD-10-CM

## 2015-12-04 DIAGNOSIS — C19 Malignant neoplasm of rectosigmoid junction: Secondary | ICD-10-CM | POA: Diagnosis not present

## 2015-12-04 DIAGNOSIS — Z95828 Presence of other vascular implants and grafts: Secondary | ICD-10-CM

## 2015-12-04 DIAGNOSIS — Z452 Encounter for adjustment and management of vascular access device: Secondary | ICD-10-CM

## 2015-12-04 DIAGNOSIS — D500 Iron deficiency anemia secondary to blood loss (chronic): Secondary | ICD-10-CM

## 2015-12-04 DIAGNOSIS — C189 Malignant neoplasm of colon, unspecified: Secondary | ICD-10-CM

## 2015-12-04 LAB — COMPREHENSIVE METABOLIC PANEL
ALBUMIN: 4 g/dL (ref 3.5–5.0)
ALK PHOS: 163 U/L — AB (ref 40–150)
ALT: 16 U/L (ref 0–55)
AST: 19 U/L (ref 5–34)
Anion Gap: 9 mEq/L (ref 3–11)
BUN: 10.5 mg/dL (ref 7.0–26.0)
CHLORIDE: 106 meq/L (ref 98–109)
CO2: 26 meq/L (ref 22–29)
Calcium: 9.6 mg/dL (ref 8.4–10.4)
Creatinine: 0.9 mg/dL (ref 0.6–1.1)
EGFR: 81 mL/min/{1.73_m2} — ABNORMAL LOW (ref >90)
GLUCOSE: 103 mg/dL (ref 70–140)
POTASSIUM: 3.7 meq/L (ref 3.5–5.1)
SODIUM: 141 meq/L (ref 136–145)
Total Bilirubin: 0.45 mg/dL (ref 0.20–1.20)
Total Protein: 7.7 g/dL (ref 6.4–8.3)

## 2015-12-04 LAB — CBC WITH DIFFERENTIAL/PLATELET
BASO%: 0.4 % (ref 0.0–2.0)
Basophils Absolute: 0.1 10*3/uL (ref 0.0–0.1)
EOS ABS: 0.1 10*3/uL (ref 0.0–0.5)
EOS%: 1 % (ref 0.0–7.0)
HEMATOCRIT: 37.5 % (ref 34.8–46.6)
HGB: 11.5 g/dL — ABNORMAL LOW (ref 11.6–15.9)
LYMPH#: 1.9 10*3/uL (ref 0.9–3.3)
LYMPH%: 14.7 % (ref 14.0–49.7)
MCH: 22.7 pg — ABNORMAL LOW (ref 25.1–34.0)
MCHC: 30.8 g/dL — ABNORMAL LOW (ref 31.5–36.0)
MCV: 73.7 fL — ABNORMAL LOW (ref 79.5–101.0)
MONO#: 0.9 10*3/uL (ref 0.1–0.9)
MONO%: 7.2 % (ref 0.0–14.0)
NEUT#: 10 10*3/uL — ABNORMAL HIGH (ref 1.5–6.5)
NEUT%: 76.7 % (ref 38.4–76.8)
PLATELETS: 352 10*3/uL (ref 145–400)
RBC: 5.09 10*6/uL (ref 3.70–5.45)
RDW: 18.8 % — ABNORMAL HIGH (ref 11.2–14.5)
WBC: 13 10*3/uL — ABNORMAL HIGH (ref 3.9–10.3)

## 2015-12-04 LAB — UA PROTEIN, DIPSTICK - CHCC: Protein, ur: <30 mg/dL

## 2015-12-04 MED ORDER — FLUOROURACIL CHEMO INJECTION 5 GM/100ML
2400.0000 mg/m2 | INTRAVENOUS | Status: DC
  Administered 2015-12-04: 4400 mg via INTRAVENOUS
  Filled 2015-12-04: qty 88

## 2015-12-04 MED ORDER — ATROPINE SULFATE 1 MG/ML IJ SOLN
INTRAMUSCULAR | Status: AC
  Filled 2015-12-04: qty 1

## 2015-12-04 MED ORDER — SODIUM CHLORIDE 0.9 % IJ SOLN
10.0000 mL | INTRAMUSCULAR | Status: DC | PRN
  Administered 2015-12-04: 10 mL via INTRAVENOUS
  Filled 2015-12-04: qty 10

## 2015-12-04 MED ORDER — FLUOROURACIL CHEMO INJECTION 2.5 GM/50ML
400.0000 mg/m2 | Freq: Once | INTRAVENOUS | Status: AC
  Administered 2015-12-04: 750 mg via INTRAVENOUS
  Filled 2015-12-04: qty 15

## 2015-12-04 MED ORDER — PALONOSETRON HCL INJECTION 0.25 MG/5ML
INTRAVENOUS | Status: AC
  Filled 2015-12-04: qty 5

## 2015-12-04 MED ORDER — LEUCOVORIN CALCIUM INJECTION 350 MG
400.0000 mg/m2 | Freq: Once | INTRAVENOUS | Status: AC
  Administered 2015-12-04: 732 mg via INTRAVENOUS
  Filled 2015-12-04: qty 36.6

## 2015-12-04 MED ORDER — SODIUM CHLORIDE 0.9 % IV SOLN
Freq: Once | INTRAVENOUS | Status: AC
  Administered 2015-12-04: 10:00:00 via INTRAVENOUS
  Filled 2015-12-04: qty 5

## 2015-12-04 MED ORDER — IRINOTECAN HCL CHEMO INJECTION 100 MG/5ML
180.0000 mg/m2 | Freq: Once | INTRAVENOUS | Status: AC
  Administered 2015-12-04: 330 mg via INTRAVENOUS
  Filled 2015-12-04: qty 5.5

## 2015-12-04 MED ORDER — PALONOSETRON HCL INJECTION 0.25 MG/5ML
0.2500 mg | Freq: Once | INTRAVENOUS | Status: AC
  Administered 2015-12-04: 0.25 mg via INTRAVENOUS

## 2015-12-04 MED ORDER — ATROPINE SULFATE 1 MG/ML IJ SOLN: 0.5000 mg | Freq: Once | INTRAMUSCULAR | Status: DC | PRN

## 2015-12-04 MED ORDER — SODIUM CHLORIDE 0.9 % IV SOLN
Freq: Once | INTRAVENOUS | Status: AC
  Administered 2015-12-04: 10:00:00 via INTRAVENOUS

## 2015-12-04 MED ORDER — SODIUM CHLORIDE 0.9 % IV SOLN
5.0000 mg/kg | Freq: Once | INTRAVENOUS | Status: AC
  Administered 2015-12-04: 350 mg via INTRAVENOUS
  Filled 2015-12-04: qty 14

## 2015-12-04 NOTE — Patient Instructions (Signed)

## 2015-12-04 NOTE — Patient Instructions (Signed)
[facility] Discharge Instructions for Patients Receiving Chemotherapy  Today you received the following chemotherapy agents:  Avastin, Leucovorin, Irinotecan, Fluorouracil  To help prevent nausea and vomiting after your treatment, we encourage you to take your nausea medication as prescribed.   If you develop nausea and vomiting that is not controlled by your nausea medication, call the clinic.   BELOW ARE SYMPTOMS THAT SHOULD BE REPORTED IMMEDIATELY:  *FEVER GREATER THAN 100.5 F  *CHILLS WITH OR WITHOUT FEVER  NAUSEA AND VOMITING THAT IS NOT CONTROLLED WITH YOUR NAUSEA MEDICATION  *UNUSUAL SHORTNESS OF BREATH  *UNUSUAL BRUISING OR BLEEDING  TENDERNESS IN MOUTH AND THROAT WITH OR WITHOUT PRESENCE OF ULCERS  *URINARY PROBLEMS  *BOWEL PROBLEMS  UNUSUAL RASH Items with * indicate a potential emergency and should be followed up as soon as possible.  Feel free to call the clinic you have any questions or concerns. The clinic phone number is [phone].  Please show the CHEMO ALERT CARD at check-in to the Emergency Department and triage nurse.   

## 2015-12-05 LAB — CEA: CEA: 293.1 ng/mL — ABNORMAL HIGH (ref 0.0–4.7)

## 2015-12-06 ENCOUNTER — Ambulatory Visit: Payer: BLUE CROSS/BLUE SHIELD

## 2015-12-06 ENCOUNTER — Ambulatory Visit (HOSPITAL_BASED_OUTPATIENT_CLINIC_OR_DEPARTMENT_OTHER): Payer: BLUE CROSS/BLUE SHIELD

## 2015-12-06 VITALS — BP 139/80 | HR 85 | Temp 98.7°F | Resp 18

## 2015-12-06 DIAGNOSIS — C787 Secondary malignant neoplasm of liver and intrahepatic bile duct: Secondary | ICD-10-CM

## 2015-12-06 DIAGNOSIS — C189 Malignant neoplasm of colon, unspecified: Secondary | ICD-10-CM | POA: Diagnosis not present

## 2015-12-06 MED ORDER — HEPARIN SOD (PORK) LOCK FLUSH 100 UNIT/ML IV SOLN
500.0000 [IU] | Freq: Once | INTRAVENOUS | Status: AC | PRN
  Administered 2015-12-06: 500 [IU]
  Filled 2015-12-06: qty 5

## 2015-12-06 MED ORDER — SODIUM CHLORIDE 0.9 % IJ SOLN
10.0000 mL | INTRAMUSCULAR | Status: DC | PRN
  Administered 2015-12-06: 10 mL
  Filled 2015-12-06: qty 10

## 2015-12-06 MED ORDER — PEGFILGRASTIM INJECTION 6 MG/0.6ML ~~LOC~~
6.0000 mg | PREFILLED_SYRINGE | Freq: Once | SUBCUTANEOUS | Status: AC
  Administered 2015-12-06: 6 mg via SUBCUTANEOUS

## 2015-12-06 NOTE — Patient Instructions (Signed)
Pegfilgrastim injection What is this medicine? PEGFILGRASTIM (PEG fil gra stim) is a long-acting granulocyte colony-stimulating factor that stimulates the growth of neutrophils, a type of white blood cell important in the body's fight against infection. It is used to reduce the incidence of fever and infection in patients with certain types of cancer who are receiving chemotherapy that affects the bone marrow, and to increase survival after being exposed to high doses of radiation. This medicine may be used for other purposes; ask your health care provider or pharmacist if you have questions. What should I tell my health care provider before I take this medicine? They need to know if you have any of these conditions: -kidney disease -latex allergy -ongoing radiation therapy -sickle cell disease -skin reactions to acrylic adhesives (On-Body Injector only) -an unusual or allergic reaction to pegfilgrastim, filgrastim, other medicines, foods, dyes, or preservatives -pregnant or trying to get pregnant -breast-feeding How should I use this medicine? This medicine is for injection under the skin. If you get this medicine at home, you will be taught how to prepare and give the pre-filled syringe or how to use the On-body Injector. Refer to the patient Instructions for Use for detailed instructions. Use exactly as directed. Take your medicine at regular intervals. Do not take your medicine more often than directed. It is important that you put your used needles and syringes in a special sharps container. Do not put them in a trash can. If you do not have a sharps container, call your pharmacist or healthcare provider to get one. Talk to your pediatrician regarding the use of this medicine in children. While this drug may be prescribed for selected conditions, precautions do apply. Overdosage: If you think you have taken too much of this medicine contact a poison control center or emergency room at  once. NOTE: This medicine is only for you. Do not share this medicine with others. What if I miss a dose? It is important not to miss your dose. Call your doctor or health care professional if you miss your dose. If you miss a dose due to an On-body Injector failure or leakage, a new dose should be administered as soon as possible using a single prefilled syringe for manual use. What may interact with this medicine? Interactions have not been studied. Give your health care provider a list of all the medicines, herbs, non-prescription drugs, or dietary supplements you use. Also tell them if you smoke, drink alcohol, or use illegal drugs. Some items may interact with your medicine. This list may not describe all possible interactions. Give your health care provider a list of all the medicines, herbs, non-prescription drugs, or dietary supplements you use. Also tell them if you smoke, drink alcohol, or use illegal drugs. Some items may interact with your medicine. What should I watch for while using this medicine? You may need blood work done while you are taking this medicine. If you are going to need a MRI, CT scan, or other procedure, tell your doctor that you are using this medicine (On-Body Injector only). What side effects may I notice from receiving this medicine? Side effects that you should report to your doctor or health care professional as soon as possible: -allergic reactions like skin rash, itching or hives, swelling of the face, lips, or tongue -dizziness -fever -pain, redness, or irritation at site where injected -pinpoint red spots on the skin -red or dark-brown urine -shortness of breath or breathing problems -stomach or side pain, or pain   at the shoulder -swelling -tiredness -trouble passing urine or change in the amount of urine Side effects that usually do not require medical attention (report to your doctor or health care professional if they continue or are  bothersome): -bone pain -muscle pain This list may not describe all possible side effects. Call your doctor for medical advice about side effects. You may report side effects to FDA at 1-800-FDA-1088. Where should I keep my medicine? Keep out of the reach of children. Store pre-filled syringes in a refrigerator between 2 and 8 degrees C (36 and 46 degrees F). Do not freeze. Keep in carton to protect from light. Throw away this medicine if it is left out of the refrigerator for more than 48 hours. Throw away any unused medicine after the expiration date. NOTE: This sheet is a summary. It may not cover all possible information. If you have questions about this medicine, talk to your doctor, pharmacist, or health care provider.    2016, Elsevier/Gold Standard. (2014-08-15 14:30:14)  

## 2015-12-18 ENCOUNTER — Ambulatory Visit (HOSPITAL_BASED_OUTPATIENT_CLINIC_OR_DEPARTMENT_OTHER): Payer: BLUE CROSS/BLUE SHIELD

## 2015-12-18 ENCOUNTER — Encounter: Payer: Self-pay | Admitting: Hematology

## 2015-12-18 ENCOUNTER — Telehealth: Payer: Self-pay | Admitting: Hematology

## 2015-12-18 ENCOUNTER — Encounter: Payer: Self-pay | Admitting: *Deleted

## 2015-12-18 ENCOUNTER — Ambulatory Visit (HOSPITAL_BASED_OUTPATIENT_CLINIC_OR_DEPARTMENT_OTHER): Payer: BLUE CROSS/BLUE SHIELD | Admitting: Hematology

## 2015-12-18 ENCOUNTER — Other Ambulatory Visit (HOSPITAL_BASED_OUTPATIENT_CLINIC_OR_DEPARTMENT_OTHER): Payer: BLUE CROSS/BLUE SHIELD

## 2015-12-18 VITALS — BP 140/77 | HR 69

## 2015-12-18 VITALS — BP 158/95 | HR 89 | Temp 98.8°F | Resp 20 | Ht 66.0 in | Wt 171.4 lb

## 2015-12-18 DIAGNOSIS — Z5112 Encounter for antineoplastic immunotherapy: Secondary | ICD-10-CM

## 2015-12-18 DIAGNOSIS — I10 Essential (primary) hypertension: Secondary | ICD-10-CM

## 2015-12-18 DIAGNOSIS — Z95828 Presence of other vascular implants and grafts: Secondary | ICD-10-CM

## 2015-12-18 DIAGNOSIS — C19 Malignant neoplasm of rectosigmoid junction: Secondary | ICD-10-CM

## 2015-12-18 DIAGNOSIS — C787 Secondary malignant neoplasm of liver and intrahepatic bile duct: Secondary | ICD-10-CM

## 2015-12-18 DIAGNOSIS — C189 Malignant neoplasm of colon, unspecified: Secondary | ICD-10-CM

## 2015-12-18 DIAGNOSIS — D500 Iron deficiency anemia secondary to blood loss (chronic): Secondary | ICD-10-CM

## 2015-12-18 DIAGNOSIS — D509 Iron deficiency anemia, unspecified: Secondary | ICD-10-CM

## 2015-12-18 DIAGNOSIS — D561 Beta thalassemia: Secondary | ICD-10-CM | POA: Diagnosis not present

## 2015-12-18 DIAGNOSIS — Z452 Encounter for adjustment and management of vascular access device: Secondary | ICD-10-CM

## 2015-12-18 DIAGNOSIS — Z5111 Encounter for antineoplastic chemotherapy: Secondary | ICD-10-CM

## 2015-12-18 LAB — CBC WITH DIFFERENTIAL/PLATELET
BASO%: 0.2 % (ref 0.0–2.0)
BASOS ABS: 0 10*3/uL (ref 0.0–0.1)
EOS%: 1.4 % (ref 0.0–7.0)
Eosinophils Absolute: 0.2 10*3/uL (ref 0.0–0.5)
HCT: 38.7 % (ref 34.8–46.6)
HEMOGLOBIN: 11.6 g/dL (ref 11.6–15.9)
LYMPH#: 2.7 10*3/uL (ref 0.9–3.3)
LYMPH%: 15.7 % (ref 14.0–49.7)
MCH: 22.3 pg — ABNORMAL LOW (ref 25.1–34.0)
MCHC: 30 g/dL — ABNORMAL LOW (ref 31.5–36.0)
MCV: 74.2 fL — AB (ref 79.5–101.0)
MONO#: 1 10*3/uL — AB (ref 0.1–0.9)
MONO%: 5.7 % (ref 0.0–14.0)
NEUT#: 13.1 10*3/uL — ABNORMAL HIGH (ref 1.5–6.5)
NEUT%: 77 % — ABNORMAL HIGH (ref 38.4–76.8)
NRBC: 1 % — AB (ref 0–0)
Platelets: 337 10*3/uL (ref 145–400)
RBC: 5.21 10*6/uL (ref 3.70–5.45)
RDW: 18.8 % — AB (ref 11.2–14.5)
WBC: 17 10*3/uL — ABNORMAL HIGH (ref 3.9–10.3)

## 2015-12-18 LAB — COMPREHENSIVE METABOLIC PANEL
ALT: 13 U/L (ref 0–55)
ANION GAP: 9 meq/L (ref 3–11)
AST: 17 U/L (ref 5–34)
Albumin: 4.1 g/dL (ref 3.5–5.0)
Alkaline Phosphatase: 196 U/L — ABNORMAL HIGH (ref 40–150)
BUN: 8.7 mg/dL (ref 7.0–26.0)
CALCIUM: 9.8 mg/dL (ref 8.4–10.4)
CHLORIDE: 106 meq/L (ref 98–109)
CO2: 27 mEq/L (ref 22–29)
CREATININE: 1 mg/dL (ref 0.6–1.1)
EGFR: 74 mL/min/{1.73_m2} — ABNORMAL LOW (ref >90)
Glucose: 95 mg/dl (ref 70–140)
Potassium: 3.7 mEq/L (ref 3.5–5.1)
Sodium: 142 mEq/L (ref 136–145)
Total Bilirubin: 0.56 mg/dL (ref 0.20–1.20)
Total Protein: 7.9 g/dL (ref 6.4–8.3)

## 2015-12-18 LAB — IRON AND TIBC
%SAT: 14 % — ABNORMAL LOW (ref 21–57)
Iron: 50 ug/dL (ref 41–142)
TIBC: 355 ug/dL (ref 236–444)
UIBC: 305 ug/dL (ref 120–384)

## 2015-12-18 LAB — FERRITIN: FERRITIN: 317 ng/mL — AB (ref 9–269)

## 2015-12-18 LAB — UA PROTEIN, DIPSTICK - CHCC: PROTEIN: NEGATIVE mg/dL

## 2015-12-18 MED ORDER — HEPARIN SOD (PORK) LOCK FLUSH 100 UNIT/ML IV SOLN
500.0000 [IU] | Freq: Once | INTRAVENOUS | Status: DC | PRN
  Filled 2015-12-18: qty 5

## 2015-12-18 MED ORDER — SODIUM CHLORIDE 0.9 % IV SOLN
Freq: Once | INTRAVENOUS | Status: AC
  Administered 2015-12-18: 11:00:00 via INTRAVENOUS

## 2015-12-18 MED ORDER — IRINOTECAN HCL CHEMO INJECTION 100 MG/5ML
180.0000 mg/m2 | Freq: Once | INTRAVENOUS | Status: AC
  Administered 2015-12-18: 330 mg via INTRAVENOUS
  Filled 2015-12-18: qty 5

## 2015-12-18 MED ORDER — SODIUM CHLORIDE 0.9 % IV SOLN
2400.0000 mg/m2 | INTRAVENOUS | Status: DC
  Administered 2015-12-18: 4400 mg via INTRAVENOUS
  Filled 2015-12-18: qty 88

## 2015-12-18 MED ORDER — PALONOSETRON HCL INJECTION 0.25 MG/5ML
0.2500 mg | Freq: Once | INTRAVENOUS | Status: AC
  Administered 2015-12-18: 0.25 mg via INTRAVENOUS

## 2015-12-18 MED ORDER — LEUCOVORIN CALCIUM INJECTION 350 MG
400.0000 mg/m2 | Freq: Once | INTRAVENOUS | Status: AC
  Administered 2015-12-18: 732 mg via INTRAVENOUS
  Filled 2015-12-18: qty 36.6

## 2015-12-18 MED ORDER — SODIUM CHLORIDE 0.9 % IJ SOLN
10.0000 mL | INTRAMUSCULAR | Status: DC | PRN
  Administered 2015-12-18: 10 mL via INTRAVENOUS
  Filled 2015-12-18: qty 10

## 2015-12-18 MED ORDER — SODIUM CHLORIDE 0.9 % IV SOLN: Freq: Once | INTRAVENOUS | Status: DC

## 2015-12-18 MED ORDER — FOSAPREPITANT DIMEGLUMINE INJECTION 150 MG
Freq: Once | INTRAVENOUS | Status: AC
  Administered 2015-12-18: 10:00:00 via INTRAVENOUS
  Filled 2015-12-18: qty 5

## 2015-12-18 MED ORDER — FLUOROURACIL CHEMO INJECTION 2.5 GM/50ML
400.0000 mg/m2 | Freq: Once | INTRAVENOUS | Status: AC
  Administered 2015-12-18: 750 mg via INTRAVENOUS
  Filled 2015-12-18: qty 15

## 2015-12-18 MED ORDER — PALONOSETRON HCL INJECTION 0.25 MG/5ML
INTRAVENOUS | Status: AC
  Filled 2015-12-18: qty 5

## 2015-12-18 MED ORDER — BEVACIZUMAB CHEMO INJECTION 400 MG/16ML
5.0000 mg/kg | Freq: Once | INTRAVENOUS | Status: AC
  Administered 2015-12-18: 350 mg via INTRAVENOUS
  Filled 2015-12-18: qty 14

## 2015-12-18 MED ORDER — SODIUM CHLORIDE 0.9 % IJ SOLN
10.0000 mL | INTRAMUSCULAR | Status: DC | PRN
  Filled 2015-12-18: qty 10

## 2015-12-18 NOTE — Patient Instructions (Signed)
[facility] Discharge Instructions for Patients Receiving Chemotherapy  Today you received the following chemotherapy agents:  Avastin, Leucovorin, Irinotecan, Fluorouracil  To help prevent nausea and vomiting after your treatment, we encourage you to take your nausea medication as prescribed.   If you develop nausea and vomiting that is not controlled by your nausea medication, call the clinic.   BELOW ARE SYMPTOMS THAT SHOULD BE REPORTED IMMEDIATELY:  *FEVER GREATER THAN 100.5 F  *CHILLS WITH OR WITHOUT FEVER  NAUSEA AND VOMITING THAT IS NOT CONTROLLED WITH YOUR NAUSEA MEDICATION  *UNUSUAL SHORTNESS OF BREATH  *UNUSUAL BRUISING OR BLEEDING  TENDERNESS IN MOUTH AND THROAT WITH OR WITHOUT PRESENCE OF ULCERS  *URINARY PROBLEMS  *BOWEL PROBLEMS  UNUSUAL RASH Items with * indicate a potential emergency and should be followed up as soon as possible.  Feel free to call the clinic you have any questions or concerns. The clinic phone number is [phone].  Please show the CHEMO ALERT CARD at check-in to the Emergency Department and triage nurse.   

## 2015-12-18 NOTE — Progress Notes (Signed)
Christina [name]  [phone]) [phone] Fax:(336) [phone]  Clinic follow up Note   Patient Care Team: Benito Mccreedy, MD as PCP - General (Internal Medicine) Juanita Craver, MD as Consulting Physician (Gastroenterology) Michael Boston, MD as Consulting Physician (General Surgery) Truitt Merle, MD as Consulting Physician (Oncology) 12/18/2015  CHIEF COMPLAINTS:  Follow up metastatic colon cancer      Oncology History   Metastatic colon cancer to liver   Staging form: Colon and Rectum, AJCC 7th Edition     Pathologic stage from 04/08/2015: T3, N0, M1 - Signed by Truitt Merle, MD on 04/22/2015  Presented to ER with intractable N/V; intermittent rectal bleeding X 3 months      Metastatic colon cancer to liver ([place])   04/03/2015 Procedure Colonoscopy showed a obstructing sigmoid rectal mass. Biopsy showed adenocarcinoma.   04/06/2015 Tumor Marker CEA=467.3 / CA19.9=1605   04/06/2015 Imaging CT chest, abdomen and pelvis with contrast showed sigmoid colon rectal mass, multiple (4) liver metastasis, with the largest 9.1 x 6.1 cm mass in the left lobe..   04/08/2015 Miscellaneous Foundation one genomic testing showed NRAS G60e and BRAF [id number] mutations   04/08/2015 Initial Diagnosis Metastatic colon cancer to liver   04/08/2015 Surgery Laparoscopic sigmoid colectomy, liver biopsy, port cath insertion, by Dr. Johney Maine    04/08/2015 Pathology Results Sigmoid colon segmental resection showed adenocarcinoma with mucinous features, pT 3, 15 lymph nodes all negative, surgical margins were negative. Liver biopsy showed metastatic adenocarcinoma.   05/08/2015 -  Chemotherapy FOLFIRI every 2 weeks, Avastin was added on from second cycle    07/14/2015 Imaging CT abdomen and pelvis showed interval slight improvement in liver metastasis. No other new lesions.   10/12/2015 Imaging  CT abdomen and pelvis with contrast showed continued interval decrease in size of multiple hepatic metastases,  no other new lesions.      HISTORY OF PRESENTING ILLNESS:  Christina [name] 55 y.o. female with past medical history of endometriosis, uterine fibroids, thalassemia trait, iron deficient anemia, who was recently found to have a sigmoid rectal cancer. I initially saw her in the hospital, she is here for the first follow-up.  She has been having intermittent rectal bleeding for the past 3 months. She thought it was related to her endometriosis, did not seek medical attention. She also has intermittent mild nausea, especially in the morning, and left lower quadrant abdominal pain. She was found to have profound anemia with hemoglobin 6.5 last week, and received blood transfusion. She was referred to gastroenterologist Dr. Collene Mares last week, underwent colonoscopy 2 days ago, which showed a obstructing sigmoid rectal mass, per patient, the colonoscopy report is not available today. Biopsy was done, but the pathology report is still pending.  She developed severe nausea, and vomited several times with clear gastric liquid. She called [name], and I was told to come to [facility] emergency room by on-call physician Dr. Carlean Purl. She had a CT scan done in the emergency room today.  Her appetite was fairly normal up to 2 days ago before recent hospital admission, when she was found to have a colorectal mass. She lost about 45 pounds in the past few weeks. 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 family history was positive for colon cancer in her maternal cousin. She is married, lives with her husband, no children.  CURRENT THERAPY: FOLFIRI and Avastin every 2 weeks, started on 05/08/2015, dose reduction and neulasta added from cycle 7  INTERIM  HISTORY [name] returns for follow-up and chemo.  She is doing very well overall.  She has a great appetite and excellent energy level, tolerating full time job very well. No significant nausea, diarrhea, or other side effects  from chemotherapy. Her abdominal pain has nearly resolved. She has some intermittent low back pain, for which she contributes to her work, she takes oxycodone once a while. No other complaints.  MEDICAL HISTORY:  Past Medical History  Diagnosis Date  . Hypertension   . Endometriosis   . Thalassanemia   . met colon ca to liver dx'd 03/2015    SURGICAL HISTORY: Past Surgical History  Procedure Laterality Date  . Myomectomy      Gyn in [place]  . Diagnostic laparoscopy      Endometriosis  . Colon resection N/A 04/08/2015    Procedure: LAPAROSCOPIC  RESECTION OF PART OF  SIGMOID COLON;  Surgeon: Michael Boston, MD;  Location: [facility];  Service: General;  Laterality: N/A;  . Liver biopsy N/A 04/08/2015    Procedure: CORE NEEDLE LIVER BIOPSY;  Surgeon: Michael Boston, MD;  Location: [facility];  Service: General;  Laterality: N/A;  . Portacath placement N/A 04/08/2015    Procedure: INSERTION PORT-A-CATH;  Surgeon: Michael Boston, MD;  Location: [facility];  Service: General;  Laterality: N/A;  . Laparoscopic sigmoid colectomy  04/08/2015    for colorectal cancer    SOCIAL HISTORY: Social History   Social History  . Marital Status: Married    Spouse Name: N/A  . Number of Children: N/A  . Years of Education: N/A   Occupational History  . Internal Medicine doctor     Works in [place] Topics  . Smoking status: Never Smoker   . Smokeless tobacco: Not on file  . Alcohol Use: Yes     Comment: socail drinker   . Drug Use: No  . Sexual Activity: Not on file   Other Topics Concern  . Not on file   Social History Narrative   Married, husband [name] (married X 2 years)   No children   IM Physician in [place]: Family History  Problem Relation Age of Onset  . Anesthesia problems Cousin 69    maternal cousin, colon cancer   . Clotting disorder Maternal Grandmother     ALLERGIES:  is allergic to lactose intolerance (gi);  milk-related compounds; and nsaids.  MEDICATIONS:  Current Outpatient Prescriptions  Medication Sig Dispense Refill  . dexamethasone (DECADRON) 4 MG tablet Take 1 tablet (4 mg total) by mouth 2 (two) times daily with a meal. 10 tablet 1  . lidocaine-prilocaine (EMLA) cream Apply 1 application topically as needed. 30 g 6  . LORazepam (ATIVAN) 0.5 MG tablet Take 1 tablet (0.5 mg total) by mouth every 8 (eight) hours. Take 0.5 mg po every 8 hours as needed for nausea/vomiting. 30 tablet 0  . NIFEdipine (PROCARDIA-XL/ADALAT CC) 30 MG 24 hr tablet Take 30 mg by mouth daily.    . ondansetron (ZOFRAN) 4 MG tablet Take 1 tablet (4 mg total) by mouth every 6 (six) hours as needed for nausea. 20 tablet 0  . oxyCODONE (OXY IR/ROXICODONE) 5 MG immediate release tablet Take 1 tablet (5 mg total) by mouth every 6 (six) hours as needed for moderate pain. 60 tablet 0  . potassium chloride SA (K-DUR,KLOR-CON) 20 MEQ tablet Take 1 tablet (20 mEq total) by mouth 2 (two) times daily. 20 tablet 1  .  prochlorperazine (COMPAZINE) 10 MG tablet Take 1 tablet (10 mg total) by mouth every 6 (six) hours as needed for nausea or vomiting. 30 tablet 1   No current facility-administered medications for this visit.   Facility-Administered Medications Ordered in Other Visits  Medication Dose Route Frequency Provider Last Rate Last Dose  . 0.9 %  sodium chloride infusion   Intravenous Once Truitt Merle, MD      . 0.9 %  sodium chloride infusion   Intravenous Once Truitt Merle, MD      . bevacizumab (AVASTIN) 350 mg in sodium chloride 0.9 % 100 mL chemo infusion  5 mg/kg (Treatment Plan Actual) Intravenous Once Truitt Merle, MD      . fluorouracil (ADRUCIL) 4,400 mg in sodium chloride 0.9 % 62 mL chemo infusion  2,400 mg/m2 (Treatment Plan Actual) Intravenous 1 day or 1 dose Truitt Merle, MD      . fluorouracil (ADRUCIL) chemo injection 750 mg  400 mg/m2 (Treatment Plan Actual) Intravenous Once Truitt Merle, MD      . fosaprepitant (EMEND) 150 mg,  dexamethasone (DECADRON) 12 mg in sodium chloride 0.9 % 145 mL IVPB   Intravenous Once Truitt Merle, MD 454 mL/hr at 12/18/15 1025    . heparin lock flush 100 unit/mL  500 Units Intracatheter Once PRN Truitt Merle, MD      . irinotecan (CAMPTOSAR) 330 mg in dextrose 5 % 500 mL chemo infusion  180 mg/m2 (Treatment Plan Actual) Intravenous Once Truitt Merle, MD      . leucovorin 732 mg in dextrose 5 % 250 mL infusion  400 mg/m2 (Treatment Plan Actual) Intravenous Once Truitt Merle, MD      . sodium chloride 0.9 % injection 10 mL  10 mL Intracatheter PRN Truitt Merle, MD        REVIEW OF SYSTEMS:   Constitutional: Denies fevers, chills or abnormal night sweats, (+) fatigue Eyes: Denies blurriness of vision, double vision or watery eyes Ears, nose, mouth, throat, and face: Denies mucositis or sore throat Respiratory: Denies cough, dyspnea or wheezes Cardiovascular: Denies palpitation, chest discomfort or lower extremity swelling Gastrointestinal:  (+) nausea, (+) RUQ and rectal pain, and mild constipation Skin: Denies abnormal skin rashes Lymphatics: Denies new lymphadenopathy or easy bruising Neurological:Denies numbness, tingling or new weaknesses Behavioral/Psych: Mood is stable, no new changes  All other systems were reviewed with the patient and are negative.  PHYSICAL EXAMINATION: ECOG PERFORMANCE STATUS: 0  Filed Vitals:   12/18/15 0912  BP: 158/95  Pulse: 89  Temp: 98.8 F (37.1 C)  Resp: 20   Filed Weights   12/18/15 0912  Weight: 171 lb 6.4 oz (77.747 kg)    GENERAL:alert, no distress and comfortable SKIN: skin color, texture, turgor are normal, no rashes or significant lesions EYES: normal, conjunctiva are pink and non-injected, sclera clear OROPHARYNX:no exudate, no erythema and lips, buccal mucosa, and tongue normal  NECK: supple, thyroid normal size, non-tender, without nodularity LYMPH:  no palpable lymphadenopathy in the cervical, axillary or inguinal LUNGS: clear to auscultation  and percussion with normal breathing effort HEART: regular rate & rhythm and no murmurs and no lower extremity edema ABDOMEN:abdomen soft, non-tender and normal bowel sounds.  There is a small subcutaneous nodule in the epigastric area, slightly tender. Musculoskeletal:no cyanosis of digits and no clubbing  PSYCH: alert & oriented x 3 with fluent speech NEURO: no focal motor/sensory deficits  LABORATORY DATA:  I have reviewed the data as listed CBC Latest Ref Rng 12/18/2015 12/04/2015 11/20/2015  WBC 3.9 - 10.3 10e3/uL 17.0(H) 13.0(H) 16.1(H)  Hemoglobin 11.6 - 15.9 g/dL 11.6 11.5(L) 12.3  Hematocrit 34.8 - 46.6 % 38.7 37.5 40.1  Platelets 145 - 400 10e3/uL 337 352 312    CMP Latest Ref Rng 12/18/2015 12/04/2015 11/20/2015  Glucose 70 - 140 mg/dl 95 103 113  BUN 7.0 - 26.0 mg/dL 8.7 10.5 11.2  Creatinine 0.6 - 1.1 mg/dL 1.0 0.9 1.0  Sodium 136 - 145 mEq/L 142 141 143  Potassium 3.5 - 5.1 mEq/L 3.7 3.7 3.9  CO2 22 - 29 mEq/L 27 26 27   Calcium 8.4 - 10.4 mg/dL 9.8 9.6 10.1  Total Protein 6.4 - 8.3 g/dL 7.9 7.7 8.1  Total Bilirubin 0.20 - 1.20 mg/dL 0.56 0.45 0.43  Alkaline Phos 40 - 150 U/L 196(H) 163(H) 161(H)  AST 5 - 34 U/L 17 19 20   ALT 0 - 55 U/L 13 16 12    Results for KENDELL, GAMMON (MRN [id number]) as of 12/18/2015 10:34  Ref. Range 10/09/2015 08:33 11/06/2015 08:55 12/04/2015 08:51  CEA Latest Units: ng/mL 453.6 (H)    CEA Latest Ref Range: 0.0-4.7 ng/mL 472.7 (H) 385.6 (H) 293.1 (H)    Diagnosis 04/08/2015 1. Liver, needle/core biopsy, ? cancer - METASTATIC ADENOCARCINOMA. 2. Colon, segmental resection for tumor, sigmoid colon mass open end proximal - COLONIC ADENOCARCINOMA WITH MUCINOUS FEATURES EXTENDING INTO PERICOLONIC ADIPOSE TISSUE AND SUBSEROSAL CONNECTIVE TISSUE. - MARGINS NOT INVOLVED. - FIFTEEN BENIGN LYMPH NODES (0/15). 3. Colon, resection margin (donut), distal anastomic ring - BENIGN COLON. - NO EVIDENCE OF MALIGNANCY.  Microscopic Comment Specimen: Sigmoid  colon with liver biopsy and anastomotic rings. Procedure: Segmental resection with liver biopsy. Tumor site: Distal sigmoid. Specimen integrity: Intact. Macroscopic intactness of mesorectum: N/A Macroscopic tumor perforation: No. Invasive tumor: Maximum size: 8 cm Histologic type(s): Colorectal adenocarcinoma with mucinous features. Histologic grade and differentiation: G2: moderately differentiated/low grade Type of polyp in which invasive carcinoma arose: No residual polyp. Microscopic extension of invasive tumor: Into pericolonic adipose tissue and subserosal connective tissue. Lymph-Vascular invasion: Present. Peri-neural invasion: Present. Tumor deposit(s) (discontinuous extramural extension): No. Resection margins: Proximal margin: Free of tumor. Distal margin: Free of tumor. Circumferential (radial) (posterior ascending, posterior descending; lateral and posterior mid-rectum; and entire lower 1/3 rectum): N/A Mesenteric margin (sigmoid and transverse): Free of tumor. Distance closest margin (if all above margins negative): 2 cm from mesenteric margin. Treatment effect (neo-adjuvant therapy): No. Additional polyp(s): No. Non-neoplastic findings: None. Lymph nodes: number examined - 15; number positive: 0 Pathologic Staging: pT3, pN0, pM1 Ancillary studies: MMR pending. (JDP:gt, 04/10/15) 2. Mismatch Repair (MMR) Protein Immunohistochemistry (IHC) IHC Expression Result: MLH1: Preserved nuclear expression (greater 50% tumor expression) MSH2: Preserved nuclear expression (greater 50% tumor expression) MSH6: Preserved nuclear expression (greater 50% tumor expression) PMS2: Preserved nuclear expression (greater 50% tumor expression) * Internal control demonstrates intact nuclear expression Interpretation: NORMAL   FoundationOne test result   RADIOGRAPHIC STUDIES: I have personally reviewed the radiological images as listed and agreed with the findings in the report.  Ct  abdomen and pelvis W Contrast 10/12/2015 IMPRESSION: 1. Continued interval decrease in size of multiple hepatic metastases. 2. The normal postsurgical appearance of the sigmoid anastomosis. No evidence for residual or locally recurrent disease. 3. Multiple uterine fibroids including a very large left-sided exophytic lesion.  ASSESSMENT & PLAN:  55 year old African-American female, with past medical history of hypertension, thalassemia trait, iron deficient anemia, uterine fibroids, endometriosis, who presents with intermittent rectal bleeding, mild nausea, and mild intermittent left lower quadrant abdominal  pain.   1. Sigmoid rectal adenocarcinoma, with liver metastases, pT3N0M1, stage IV, MMR normal, NRAS and BRAF mutation (+)  -I reviewed her surgical pathology findings with her in great details. Her liver biopsy confirmed metastatic adenocarcinoma  From colon cancer. Her primary sigmoid rectal tumor has been completely removed. -We again reviewed her CT findings, it showed at least 4 liver metastatic lesion, with the largest one measuring 9.1 cm in the left lobe. No other distant metastasis -We reviewed the natural history of metastatic colon cancer, and incurable nature at this is stage, giving her large metastatic disease in the liver. We also discussed that if she had excellent response to chemotherapy, I'll present her restaging CT scan in our GI tumor Board, to see if she is a candidate for liver surgery or liver targeted therapy to maximally controlled her disease.  -Her tumor has BRAF mutation, which is a poor prognostic factor  -Her tumor has NRAS mutation, not a candidate for EGFR inhibitor  -Given the MSI-stable, she might not benefit from immunotherapy, except in a clinically trial setting  -I reviewed her restaging CT scan from 10/12/2015, which showed continuous improvement in liver mets, no other new lesions, she has had a good partial response so far. She is clinically doing very  well, tolerating treatment well, will continue current therapy. - her tumor marker CEA has been decreasing lately -lab reviewed, adequate for treatment, will proceed chemo today. Due to her prolonged neutropenia, I have reduce her 5-FU dose slightly and added Neulasta from cycle 7.  -Repeat staging CT scans in 4 weeks  2. Iron deficient Anemia, and beta thalassemia trait  -Secondary to GI bleeding and iron deficiency -Ferritin level was 8, consistent with iron deficiency from GI bleeding -She received Feraheme 510 mg twice,  responded very well. Her anemia resolved, even she is getting chemotherapy.  -I repeated her iron study today, results still pending  4 RUQ abdominal pain, low back pain -Abdominal pain near resolved now, she has intermittent low back pain, which she contributes to her work -She takes oxycodone once every  A few days  5. Hypertension -She has no history of hypertension. Blood pressure has been slightly elevated in the past months, repeated normal. Possibly related to Avastin -I'll continue monitoring her blood pressure closely. Consider adding amlodipine if persistent high.   Plan - FOLFIRI and avastin today and continue every 2 weeks, with Neulasta on day 3 -I'll see her back in 4 weeks with restaging CT chest, abdomen and pelvis with contrast the day before  All questions were answered. The patient knows to call the clinic with any problems, questions or concerns. I spent 25 minutes counseling the patient face to face. The total time spent in the appointment was 30 minutes and more than 50% was on counseling.     Truitt Merle, MD 12/18/2015

## 2015-12-18 NOTE — Telephone Encounter (Signed)
Gave and printed appt sched and avs for pt for May and June  °

## 2015-12-18 NOTE — Patient Instructions (Signed)

## 2015-12-20 ENCOUNTER — Ambulatory Visit (HOSPITAL_BASED_OUTPATIENT_CLINIC_OR_DEPARTMENT_OTHER): Payer: BLUE CROSS/BLUE SHIELD

## 2015-12-20 ENCOUNTER — Ambulatory Visit: Payer: BLUE CROSS/BLUE SHIELD

## 2015-12-20 VITALS — BP 149/88 | HR 86 | Temp 98.8°F | Resp 18

## 2015-12-20 DIAGNOSIS — C189 Malignant neoplasm of colon, unspecified: Secondary | ICD-10-CM

## 2015-12-20 DIAGNOSIS — C787 Secondary malignant neoplasm of liver and intrahepatic bile duct: Principal | ICD-10-CM

## 2015-12-20 DIAGNOSIS — C19 Malignant neoplasm of rectosigmoid junction: Secondary | ICD-10-CM | POA: Diagnosis not present

## 2015-12-20 DIAGNOSIS — D701 Agranulocytosis secondary to cancer chemotherapy: Secondary | ICD-10-CM

## 2015-12-20 MED ORDER — HEPARIN SOD (PORK) LOCK FLUSH 100 UNIT/ML IV SOLN
500.0000 [IU] | Freq: Once | INTRAVENOUS | Status: AC | PRN
  Administered 2015-12-20: 500 [IU]
  Filled 2015-12-20: qty 5

## 2015-12-20 MED ORDER — SODIUM CHLORIDE 0.9 % IJ SOLN
10.0000 mL | INTRAMUSCULAR | Status: DC | PRN
  Administered 2015-12-20: 10 mL
  Filled 2015-12-20: qty 10

## 2015-12-20 MED ORDER — PEGFILGRASTIM INJECTION 6 MG/0.6ML ~~LOC~~
6.0000 mg | PREFILLED_SYRINGE | Freq: Once | SUBCUTANEOUS | Status: AC
  Administered 2015-12-20: 6 mg via SUBCUTANEOUS

## 2015-12-20 NOTE — Patient Instructions (Signed)
Pegfilgrastim injection What is this medicine? PEGFILGRASTIM (PEG fil gra stim) is a long-acting granulocyte colony-stimulating factor that stimulates the growth of neutrophils, a type of white blood cell important in the body's fight against infection. It is used to reduce the incidence of fever and infection in patients with certain types of cancer who are receiving chemotherapy that affects the bone marrow, and to increase survival after being exposed to high doses of radiation. This medicine may be used for other purposes; ask your health care provider or pharmacist if you have questions. What should I tell my health care provider before I take this medicine? They need to know if you have any of these conditions: -kidney disease -latex allergy -ongoing radiation therapy -sickle cell disease -skin reactions to acrylic adhesives (On-Body Injector only) -an unusual or allergic reaction to pegfilgrastim, filgrastim, other medicines, foods, dyes, or preservatives -pregnant or trying to get pregnant -breast-feeding How should I use this medicine? This medicine is for injection under the skin. If you get this medicine at home, you will be taught how to prepare and give the pre-filled syringe or how to use the On-body Injector. Refer to the patient Instructions for Use for detailed instructions. Use exactly as directed. Take your medicine at regular intervals. Do not take your medicine more often than directed. It is important that you put your used needles and syringes in a special sharps container. Do not put them in a trash can. If you do not have a sharps container, call your pharmacist or healthcare provider to get one. Talk to your pediatrician regarding the use of this medicine in children. While this drug may be prescribed for selected conditions, precautions do apply. Overdosage: If you think you have taken too much of this medicine contact a poison control center or emergency room at  once. NOTE: This medicine is only for you. Do not share this medicine with others. What if I miss a dose? It is important not to miss your dose. Call your doctor or health care professional if you miss your dose. If you miss a dose due to an On-body Injector failure or leakage, a new dose should be administered as soon as possible using a single prefilled syringe for manual use. What may interact with this medicine? Interactions have not been studied. Give your health care provider a list of all the medicines, herbs, non-prescription drugs, or dietary supplements you use. Also tell them if you smoke, drink alcohol, or use illegal drugs. Some items may interact with your medicine. This list may not describe all possible interactions. Give your health care provider a list of all the medicines, herbs, non-prescription drugs, or dietary supplements you use. Also tell them if you smoke, drink alcohol, or use illegal drugs. Some items may interact with your medicine. What should I watch for while using this medicine? You may need blood work done while you are taking this medicine. If you are going to need a MRI, CT scan, or other procedure, tell your doctor that you are using this medicine (On-Body Injector only). What side effects may I notice from receiving this medicine? Side effects that you should report to your doctor or health care professional as soon as possible: -allergic reactions like skin rash, itching or hives, swelling of the face, lips, or tongue -dizziness -fever -pain, redness, or irritation at site where injected -pinpoint red spots on the skin -red or dark-brown urine -shortness of breath or breathing problems -stomach or side pain, or pain   at the shoulder -swelling -tiredness -trouble passing urine or change in the amount of urine Side effects that usually do not require medical attention (report to your doctor or health care professional if they continue or are  bothersome): -bone pain -muscle pain This list may not describe all possible side effects. Call your doctor for medical advice about side effects. You may report side effects to FDA at 1-800-FDA-1088. Where should I keep my medicine? Keep out of the reach of children. Store pre-filled syringes in a refrigerator between 2 and 8 degrees C (36 and 46 degrees F). Do not freeze. Keep in carton to protect from light. Throw away this medicine if it is left out of the refrigerator for more than 48 hours. Throw away any unused medicine after the expiration date. NOTE: This sheet is a summary. It may not cover all possible information. If you have questions about this medicine, talk to your doctor, pharmacist, or health care provider.    2016, Elsevier/Gold Standard. (2014-08-15 14:30:14)  

## 2015-12-23 ENCOUNTER — Telehealth: Payer: Self-pay | Admitting: *Deleted

## 2015-12-23 NOTE — Telephone Encounter (Signed)
"  I have a scheduling conflict.  I'm on call the second weekend of every month.  No one will trade with me in June due to graduations and vacations.  I cannot have CT scans or treatment June 8th. June 15 or June 29th.  After the month of June I can go back on schedule.  Return number [phone]."

## 2015-12-23 NOTE — Telephone Encounter (Signed)
I called her back and left a message: we can skip her chemo on 6/8 and do it on 6/22, or we can change to oral Xeloda 2 weeks on and one week off for June. I encourage her to call back if she has questions, or go ahead to adjust her schedule with out scheduler.   Truitt Merle  12/23/2015

## 2015-12-24 ENCOUNTER — Telehealth: Payer: Self-pay | Admitting: *Deleted

## 2015-12-24 NOTE — Telephone Encounter (Signed)
Patient reports she has talked with Dr. Burr Medico earlier today.

## 2015-12-24 NOTE — Telephone Encounter (Signed)
  Oncology Nurse Navigator Documentation  Navigator Location: [facility] (12/24/15 1220) Navigator Encounter Type: Telephone (12/24/15 1220) Telephone: Incoming Call;Appt Confirmation/Clarification (12/24/15 1220)  Christina [name] left VM that she would prefer to take the Xeloda pill with her June treatment of 01/15/16. Will this then change her treatment following this one to 6/29 instead of 6/22? Forwarded message to MD.

## 2015-12-25 ENCOUNTER — Telehealth: Payer: Self-pay | Admitting: Hematology

## 2015-12-25 ENCOUNTER — Encounter: Payer: Self-pay | Admitting: Pharmacist

## 2015-12-25 ENCOUNTER — Telehealth: Payer: Self-pay | Admitting: *Deleted

## 2015-12-25 ENCOUNTER — Other Ambulatory Visit: Payer: Self-pay | Admitting: Hematology

## 2015-12-25 DIAGNOSIS — C787 Secondary malignant neoplasm of liver and intrahepatic bile duct: Principal | ICD-10-CM

## 2015-12-25 DIAGNOSIS — C189 Malignant neoplasm of colon, unspecified: Secondary | ICD-10-CM

## 2015-12-25 MED ORDER — CAPECITABINE 500 MG PO TABS: 1000.0000 mg/m2 | ORAL_TABLET | Freq: Two times a day (BID) | ORAL | Status: DC

## 2015-12-25 NOTE — Progress Notes (Signed)
12/25/15 New Xeloda prescription faxed to [facility] outpatient [facility]. Will continue to follow. Johny Drilling, PharmD

## 2015-12-25 NOTE — Telephone Encounter (Signed)
I called her back and discussed chemo options. She agrees to have FOLFIRI and Avastin on 5/25, then start Xeloda on 6/8, 2 weeks on, one week off, and she will restart  FOLFIRI and avastin on 6/29. CT scan on 6/7, I will give her a call after her ct scan and she will see me on 6/29.   Truitt Merle  12/25/2015

## 2015-12-25 NOTE — Telephone Encounter (Signed)
Per staff message and POF I have scheduled appts. Advised scheduler of appts. JMW  

## 2015-12-25 NOTE — Telephone Encounter (Signed)
per pofto sch pt appt-*sent MW email to sch trmt-pt to get updated copy on 5/25

## 2015-12-25 NOTE — Progress Notes (Signed)
12/25/15 New Xeloda prescription faxed to [facility] per insurance requirement along with copy of insurance card and copy of last provider office note. Johny Drilling, PharmD

## 2016-01-01 ENCOUNTER — Other Ambulatory Visit: Payer: Self-pay | Admitting: *Deleted

## 2016-01-01 ENCOUNTER — Other Ambulatory Visit (HOSPITAL_BASED_OUTPATIENT_CLINIC_OR_DEPARTMENT_OTHER): Payer: BLUE CROSS/BLUE SHIELD

## 2016-01-01 ENCOUNTER — Ambulatory Visit: Payer: BLUE CROSS/BLUE SHIELD

## 2016-01-01 ENCOUNTER — Telehealth: Payer: Self-pay | Admitting: Pharmacist

## 2016-01-01 ENCOUNTER — Ambulatory Visit (HOSPITAL_BASED_OUTPATIENT_CLINIC_OR_DEPARTMENT_OTHER): Payer: BLUE CROSS/BLUE SHIELD

## 2016-01-01 VITALS — BP 150/79 | HR 79 | Temp 99.2°F | Resp 19

## 2016-01-01 DIAGNOSIS — C189 Malignant neoplasm of colon, unspecified: Secondary | ICD-10-CM | POA: Diagnosis not present

## 2016-01-01 DIAGNOSIS — C787 Secondary malignant neoplasm of liver and intrahepatic bile duct: Secondary | ICD-10-CM | POA: Diagnosis not present

## 2016-01-01 DIAGNOSIS — D500 Iron deficiency anemia secondary to blood loss (chronic): Secondary | ICD-10-CM

## 2016-01-01 DIAGNOSIS — Z5112 Encounter for antineoplastic immunotherapy: Secondary | ICD-10-CM | POA: Diagnosis not present

## 2016-01-01 DIAGNOSIS — Z5111 Encounter for antineoplastic chemotherapy: Secondary | ICD-10-CM

## 2016-01-01 DIAGNOSIS — Z95828 Presence of other vascular implants and grafts: Secondary | ICD-10-CM

## 2016-01-01 LAB — CBC WITH DIFFERENTIAL/PLATELET
BASO%: 0.2 % (ref 0.0–2.0)
Basophils Absolute: 0 10*3/uL (ref 0.0–0.1)
EOS ABS: 0.1 10*3/uL (ref 0.0–0.5)
EOS%: 0.9 % (ref 0.0–7.0)
HCT: 38.5 % (ref 34.8–46.6)
HEMOGLOBIN: 11.7 g/dL (ref 11.6–15.9)
LYMPH%: 18.1 % (ref 14.0–49.7)
MCH: 23.3 pg — AB (ref 25.1–34.0)
MCHC: 30.4 g/dL — ABNORMAL LOW (ref 31.5–36.0)
MCV: 76.5 fL — AB (ref 79.5–101.0)
MONO#: 0.8 10*3/uL (ref 0.1–0.9)
MONO%: 6.3 % (ref 0.0–14.0)
NEUT%: 74.5 % (ref 38.4–76.8)
NEUTROS ABS: 9 10*3/uL — AB (ref 1.5–6.5)
Platelets: 341 10*3/uL (ref 145–400)
RBC: 5.03 10*6/uL (ref 3.70–5.45)
RDW: 18.8 % — AB (ref 11.2–14.5)
WBC: 12.1 10*3/uL — AB (ref 3.9–10.3)
lymph#: 2.2 10*3/uL (ref 0.9–3.3)

## 2016-01-01 LAB — COMPREHENSIVE METABOLIC PANEL
ALK PHOS: 169 U/L — AB (ref 40–150)
ALT: 17 U/L (ref 0–55)
ANION GAP: 10 meq/L (ref 3–11)
AST: 22 U/L (ref 5–34)
Albumin: 4.2 g/dL (ref 3.5–5.0)
BILIRUBIN TOTAL: 0.46 mg/dL (ref 0.20–1.20)
BUN: 9.3 mg/dL (ref 7.0–26.0)
CHLORIDE: 108 meq/L (ref 98–109)
CO2: 25 meq/L (ref 22–29)
CREATININE: 0.9 mg/dL (ref 0.6–1.1)
Calcium: 9.9 mg/dL (ref 8.4–10.4)
EGFR: 84 mL/min/{1.73_m2} — AB (ref >90)
GLUCOSE: 97 mg/dL (ref 70–140)
Potassium: 3.6 mEq/L (ref 3.5–5.1)
SODIUM: 142 meq/L (ref 136–145)
TOTAL PROTEIN: 8 g/dL (ref 6.4–8.3)

## 2016-01-01 LAB — UA PROTEIN, DIPSTICK - CHCC: PROTEIN: 30 mg/dL

## 2016-01-01 MED ORDER — FLUOROURACIL CHEMO INJECTION 2.5 GM/50ML
400.0000 mg/m2 | Freq: Once | INTRAVENOUS | Status: AC
  Administered 2016-01-01: 750 mg via INTRAVENOUS
  Filled 2016-01-01: qty 15

## 2016-01-01 MED ORDER — OXYCODONE HCL 5 MG PO TABS: 5.0000 mg | ORAL_TABLET | Freq: Four times a day (QID) | ORAL | Status: DC | PRN

## 2016-01-01 MED ORDER — SODIUM CHLORIDE 0.9 % IJ SOLN
10.0000 mL | INTRAMUSCULAR | Status: DC | PRN
  Administered 2016-01-01: 10 mL via INTRAVENOUS
  Filled 2016-01-01: qty 10

## 2016-01-01 MED ORDER — SODIUM CHLORIDE 0.9 % IV SOLN
Freq: Once | INTRAVENOUS | Status: AC
  Administered 2016-01-01: 11:00:00 via INTRAVENOUS
  Filled 2016-01-01: qty 5

## 2016-01-01 MED ORDER — ATROPINE SULFATE 1 MG/ML IJ SOLN
0.5000 mg | Freq: Once | INTRAMUSCULAR | Status: AC | PRN
  Administered 2016-01-01: 0.5 mg via INTRAVENOUS

## 2016-01-01 MED ORDER — SODIUM CHLORIDE 0.9 % IV SOLN
2400.0000 mg/m2 | INTRAVENOUS | Status: DC
  Administered 2016-01-01: 4400 mg via INTRAVENOUS
  Filled 2016-01-01: qty 88

## 2016-01-01 MED ORDER — PALONOSETRON HCL INJECTION 0.25 MG/5ML
0.2500 mg | Freq: Once | INTRAVENOUS | Status: AC
  Administered 2016-01-01: 0.25 mg via INTRAVENOUS

## 2016-01-01 MED ORDER — ATROPINE SULFATE 1 MG/ML IJ SOLN
INTRAMUSCULAR | Status: AC
  Filled 2016-01-01: qty 1

## 2016-01-01 MED ORDER — SODIUM CHLORIDE 0.9 % IV SOLN
Freq: Once | INTRAVENOUS | Status: AC
  Administered 2016-01-01: 11:00:00 via INTRAVENOUS

## 2016-01-01 MED ORDER — SODIUM CHLORIDE 0.9 % IV SOLN
5.0000 mg/kg | Freq: Once | INTRAVENOUS | Status: AC
  Administered 2016-01-01: 350 mg via INTRAVENOUS
  Filled 2016-01-01: qty 14

## 2016-01-01 MED ORDER — IRINOTECAN HCL CHEMO INJECTION 100 MG/5ML
180.0000 mg/m2 | Freq: Once | INTRAVENOUS | Status: AC
  Administered 2016-01-01: 330 mg via INTRAVENOUS
  Filled 2016-01-01: qty 5.5

## 2016-01-01 MED ORDER — LEUCOVORIN CALCIUM INJECTION 350 MG
400.0000 mg/m2 | Freq: Once | INTRAVENOUS | Status: AC
  Administered 2016-01-01: 732 mg via INTRAVENOUS
  Filled 2016-01-01: qty 36.6

## 2016-01-01 MED ORDER — PALONOSETRON HCL INJECTION 0.25 MG/5ML
INTRAVENOUS | Status: AC
  Filled 2016-01-01: qty 5

## 2016-01-01 NOTE — Telephone Encounter (Signed)
Received fax notification on 12/30/15 from [facility]. [facility] Phone = [phone].  The [place] team has begun the process of verifying benefits and is working to confirm a shipment.  Raul Del, PharmD, BCPS, [facility] [phone]

## 2016-01-01 NOTE — Patient Instructions (Signed)
[facility] Discharge Instructions for Patients Receiving Chemotherapy  Today you received the following chemotherapy agents:  Avastin, Leucovorin, Irinotecan, Fluorouracil  To help prevent nausea and vomiting after your treatment, we encourage you to take your nausea medication as prescribed.   If you develop nausea and vomiting that is not controlled by your nausea medication, call the clinic.   BELOW ARE SYMPTOMS THAT SHOULD BE REPORTED IMMEDIATELY:  *FEVER GREATER THAN 100.5 F  *CHILLS WITH OR WITHOUT FEVER  NAUSEA AND VOMITING THAT IS NOT CONTROLLED WITH YOUR NAUSEA MEDICATION  *UNUSUAL SHORTNESS OF BREATH  *UNUSUAL BRUISING OR BLEEDING  TENDERNESS IN MOUTH AND THROAT WITH OR WITHOUT PRESENCE OF ULCERS  *URINARY PROBLEMS  *BOWEL PROBLEMS  UNUSUAL RASH Items with * indicate a potential emergency and should be followed up as soon as possible.  Feel free to call the clinic you have any questions or concerns. The clinic phone number is [phone].  Please show the CHEMO ALERT CARD at check-in to the Emergency Department and triage nurse.   

## 2016-01-01 NOTE — Patient Instructions (Signed)

## 2016-01-02 LAB — CEA: CEA1: 206.3 ng/mL — AB (ref 0.0–4.7)

## 2016-01-03 ENCOUNTER — Ambulatory Visit: Payer: BLUE CROSS/BLUE SHIELD

## 2016-01-03 ENCOUNTER — Ambulatory Visit (HOSPITAL_BASED_OUTPATIENT_CLINIC_OR_DEPARTMENT_OTHER): Payer: BLUE CROSS/BLUE SHIELD

## 2016-01-03 VITALS — BP 155/99 | HR 80 | Temp 98.9°F | Resp 18

## 2016-01-03 DIAGNOSIS — C787 Secondary malignant neoplasm of liver and intrahepatic bile duct: Secondary | ICD-10-CM | POA: Diagnosis not present

## 2016-01-03 DIAGNOSIS — C189 Malignant neoplasm of colon, unspecified: Secondary | ICD-10-CM

## 2016-01-03 MED ORDER — HEPARIN SOD (PORK) LOCK FLUSH 100 UNIT/ML IV SOLN
500.0000 [IU] | Freq: Once | INTRAVENOUS | Status: AC | PRN
Start: 2016-01-03 — End: 2016-01-03
  Administered 2016-01-03: 500 [IU]
  Filled 2016-01-03: qty 5

## 2016-01-03 MED ORDER — PEGFILGRASTIM INJECTION 6 MG/0.6ML ~~LOC~~
6.0000 mg | PREFILLED_SYRINGE | Freq: Once | SUBCUTANEOUS | Status: AC
  Administered 2016-01-03: 6 mg via SUBCUTANEOUS

## 2016-01-03 MED ORDER — SODIUM CHLORIDE 0.9 % IJ SOLN
10.0000 mL | INTRAMUSCULAR | Status: DC | PRN
  Administered 2016-01-03: 10 mL
  Filled 2016-01-03: qty 10

## 2016-01-03 NOTE — Patient Instructions (Signed)
Pegfilgrastim injection What is this medicine? PEGFILGRASTIM (PEG fil gra stim) is a long-acting granulocyte colony-stimulating factor that stimulates the growth of neutrophils, a type of white blood cell important in the body's fight against infection. It is used to reduce the incidence of fever and infection in patients with certain types of cancer who are receiving chemotherapy that affects the bone marrow, and to increase survival after being exposed to high doses of radiation. This medicine may be used for other purposes; ask your health care provider or pharmacist if you have questions. What should I tell my health care provider before I take this medicine? They need to know if you have any of these conditions: -kidney disease -latex allergy -ongoing radiation therapy -sickle cell disease -skin reactions to acrylic adhesives (On-Body Injector only) -an unusual or allergic reaction to pegfilgrastim, filgrastim, other medicines, foods, dyes, or preservatives -pregnant or trying to get pregnant -breast-feeding How should I use this medicine? This medicine is for injection under the skin. If you get this medicine at home, you will be taught how to prepare and give the pre-filled syringe or how to use the On-body Injector. Refer to the patient Instructions for Use for detailed instructions. Use exactly as directed. Take your medicine at regular intervals. Do not take your medicine more often than directed. It is important that you put your used needles and syringes in a special sharps container. Do not put them in a trash can. If you do not have a sharps container, call your pharmacist or healthcare provider to get one. Talk to your pediatrician regarding the use of this medicine in children. While this drug may be prescribed for selected conditions, precautions do apply. Overdosage: If you think you have taken too much of this medicine contact a poison control center or emergency room at  once. NOTE: This medicine is only for you. Do not share this medicine with others. What if I miss a dose? It is important not to miss your dose. Call your doctor or health care professional if you miss your dose. If you miss a dose due to an On-body Injector failure or leakage, a new dose should be administered as soon as possible using a single prefilled syringe for manual use. What may interact with this medicine? Interactions have not been studied. Give your health care provider a list of all the medicines, herbs, non-prescription drugs, or dietary supplements you use. Also tell them if you smoke, drink alcohol, or use illegal drugs. Some items may interact with your medicine. This list may not describe all possible interactions. Give your health care provider a list of all the medicines, herbs, non-prescription drugs, or dietary supplements you use. Also tell them if you smoke, drink alcohol, or use illegal drugs. Some items may interact with your medicine. What should I watch for while using this medicine? You may need blood work done while you are taking this medicine. If you are going to need a MRI, CT scan, or other procedure, tell your doctor that you are using this medicine (On-Body Injector only). What side effects may I notice from receiving this medicine? Side effects that you should report to your doctor or health care professional as soon as possible: -allergic reactions like skin rash, itching or hives, swelling of the face, lips, or tongue -dizziness -fever -pain, redness, or irritation at site where injected -pinpoint red spots on the skin -red or dark-brown urine -shortness of breath or breathing problems -stomach or side pain, or pain   at the shoulder -swelling -tiredness -trouble passing urine or change in the amount of urine Side effects that usually do not require medical attention (report to your doctor or health care professional if they continue or are  bothersome): -bone pain -muscle pain This list may not describe all possible side effects. Call your doctor for medical advice about side effects. You may report side effects to FDA at 1-800-FDA-1088. Where should I keep my medicine? Keep out of the reach of children. Store pre-filled syringes in a refrigerator between 2 and 8 degrees C (36 and 46 degrees F). Do not freeze. Keep in carton to protect from light. Throw away this medicine if it is left out of the refrigerator for more than 48 hours. Throw away any unused medicine after the expiration date. NOTE: This sheet is a summary. It may not cover all possible information. If you have questions about this medicine, talk to your doctor, pharmacist, or health care provider.    2016, Elsevier/Gold Standard. (2014-08-15 14:30:14)  

## 2016-01-08 ENCOUNTER — Telehealth: Payer: Self-pay | Admitting: *Deleted

## 2016-01-08 DIAGNOSIS — C183 Malignant neoplasm of hepatic flexure: Secondary | ICD-10-CM

## 2016-01-08 MED ORDER — ONDANSETRON HCL 4 MG PO TABS: 4.0000 mg | ORAL_TABLET | Freq: Four times a day (QID) | ORAL | Status: DC | PRN

## 2016-01-08 MED ORDER — POTASSIUM CHLORIDE CRYS ER 20 MEQ PO TBCR: 20.0000 meq | EXTENDED_RELEASE_TABLET | Freq: Two times a day (BID) | ORAL | Status: DC

## 2016-01-08 NOTE — Telephone Encounter (Signed)
"  I need refills on the potassium and zofran sent to [facility] at [facility]."  Will send as requested.  Advised how to obtain future refills through [facility].

## 2016-01-08 NOTE — Addendum Note (Signed)
Addended by: Cherylynn Ridges on: 01/08/2016 01:06 PM   Modules accepted: Orders

## 2016-01-09 ENCOUNTER — Encounter: Payer: Self-pay | Admitting: Pharmacist

## 2016-01-09 NOTE — Progress Notes (Signed)
Oral Chemotherapy Pharmacist Encounter Prior Auth approved by [facility] from 01/09/16 through 01/08/17. I contacted [facility] [phone]) and informed them of the approval.  The patient's co-pay is $334.  I spoke with patient for overview of Xeloda. This is a planned short course of Xeloda- start on 6/8, 2 weeks on, one week off, and she will restart FOLFIRI and avastin on 6/29  Counseled patient on administration, dosing, side effects, safe handling, and monitoring. Side effects include but not limited to: diarrhea, nausea/vomiting, change in appetite, mouth sores, hand/foot syndrome .  Christina [name] voiced understanding and appreciation.   All questions answered.  Pt states this week she has had some nausea and weakness.  She has prochlorperazine and ondansetron at home if needed for CINV. She will get Imodium if she needs for diarrhea.  She is concerned about starting Xeloda on 01/15/16 when she is on-call at work that weekend and then the 2nd week has planned a choir trip to [place] w/ her church.  They will be doing a lot of walking.  I advised her to wear shoes that are not tight-fitting and that are comfortable.  She also should take rests and take pressure off of her feet when possible.  She does use Eucerin cream liberally on her hands and feet.  She has discoloration (darkness) in her hands already from 5FU.  She is going to discuss this with her husband this evening and plans to call [name] and arrange delivery of drug if they are in agreement to begin Xeloda.  If she decides against the Xeloda, she will call Dr. Ernestina Penna RN/Susan Michaelle Copas, RN Navigator.  Thank you, Kennith Center, Pharm.D., CPP 01/09/2016@4 :66 PM Oral Chemotherapy Clinic

## 2016-01-09 NOTE — Progress Notes (Signed)
I called [facility] (ph# [phone]) to follow up on pts Xeloda Rx after they had acknowledged receipt of Rx on 12/30/15 and stated they would begin the process of verifying benefits. (Note that original date Rx faxed to [facility] was 12/25/15, same day Rx written by Dr. Burr Medico)  Today (01/09/16) I was told over the phone by a [facility] representative that the Prior Auth for [place] pts is not done by [facility] and that I needed to call [employer] [place] for a PA. I then called [employer] [place] (ph# [phone]) and their representative stated the PA has never been started.  They advised me to submit the PA online via [email or web address], which I did submit.  I updated Merceda Elks, RN navigator with this information.   Planned start date of Xeloda is 01/15/16.   If the PA is obtained Mon/Tues (6/5 or 01/13/16) we can call [facility] back and ensure they received PA from [facility] [place] so they can begin processing Rx.  We also will need to call pt and update her on the Rx and ask pt to call [facility] (ph# [phone]) to arrange shipment of her Xeloda ASAP. I do not see documentation that the pt has been counseled by our pharmacy staff on Xeloda yet.  We can provide that as well.  Kennith Center, Pharm.D., CPP 01/09/2016@3 :[place]

## 2016-01-12 NOTE — Progress Notes (Signed)
Ms Higuchi called 01/12/16 with concerns based on side effect possibilities from Xeloda.  Patient currently states she is quite debilitated post her treatment on days 1 and 2 of Folfiri with return to daily activities on day 3.  Patient would like to try a lower dose of Xeloda at first to see how she is impacted and then increase dose based on quality of daily activity.  I spoke with Dr Burr Medico and she recommends starting the patient at a 50% dose and increasing as tolerated.  Dr Burr Medico believes her symptoms are more related to the irinotecan but based on patients job responsibilities starting lower would be acceptable based on patient apprehension.  Patient also advised by pharmacy to take a zofran 30 minutes prior to Xeloda and to also take Xeloda within 30 minutes of the morning meal and the evening meal.  Patient called by pharmacist as to new instructions and patient repeated instructions.  Patient will contact [facility] where prescription has been sent to pick up.  Patient also encouraged to call for any further questions or concerns to the [facility].  New dosing:  Xeloda 1000 mg (2 tabs) BID and increase by 1 tablet (500 mg) if dose tolerated up to 4 tablets BID.  Patient repeated dosing to pharmacist and acknowledged understanding             on administration requirements.  Henreitta Leber, PharmD

## 2016-01-14 ENCOUNTER — Other Ambulatory Visit (HOSPITAL_BASED_OUTPATIENT_CLINIC_OR_DEPARTMENT_OTHER): Payer: BLUE CROSS/BLUE SHIELD

## 2016-01-14 ENCOUNTER — Ambulatory Visit (HOSPITAL_BASED_OUTPATIENT_CLINIC_OR_DEPARTMENT_OTHER): Payer: BLUE CROSS/BLUE SHIELD

## 2016-01-14 ENCOUNTER — Ambulatory Visit (HOSPITAL_COMMUNITY)
Admission: RE | Admit: 2016-01-14 | Discharge: 2016-01-14 | Disposition: A | Discharge status: Home / Self Care | Payer: BLUE CROSS/BLUE SHIELD | Source: Ambulatory Visit | Attending: Hematology | Admitting: Hematology

## 2016-01-14 DIAGNOSIS — I2510 Atherosclerotic heart disease of native coronary artery without angina pectoris: Secondary | ICD-10-CM | POA: Insufficient documentation

## 2016-01-14 DIAGNOSIS — C19 Malignant neoplasm of rectosigmoid junction: Secondary | ICD-10-CM

## 2016-01-14 DIAGNOSIS — C189 Malignant neoplasm of colon, unspecified: Secondary | ICD-10-CM

## 2016-01-14 DIAGNOSIS — K6389 Other specified diseases of intestine: Secondary | ICD-10-CM | POA: Insufficient documentation

## 2016-01-14 DIAGNOSIS — Z452 Encounter for adjustment and management of vascular access device: Secondary | ICD-10-CM | POA: Diagnosis not present

## 2016-01-14 DIAGNOSIS — C787 Secondary malignant neoplasm of liver and intrahepatic bile duct: Secondary | ICD-10-CM | POA: Diagnosis not present

## 2016-01-14 DIAGNOSIS — K449 Diaphragmatic hernia without obstruction or gangrene: Secondary | ICD-10-CM | POA: Insufficient documentation

## 2016-01-14 DIAGNOSIS — J90 Pleural effusion, not elsewhere classified: Secondary | ICD-10-CM | POA: Insufficient documentation

## 2016-01-14 DIAGNOSIS — I289 Disease of pulmonary vessels, unspecified: Secondary | ICD-10-CM | POA: Diagnosis not present

## 2016-01-14 DIAGNOSIS — K8020 Calculus of gallbladder without cholecystitis without obstruction: Secondary | ICD-10-CM | POA: Diagnosis not present

## 2016-01-14 DIAGNOSIS — Z95828 Presence of other vascular implants and grafts: Secondary | ICD-10-CM

## 2016-01-14 DIAGNOSIS — D500 Iron deficiency anemia secondary to blood loss (chronic): Secondary | ICD-10-CM

## 2016-01-14 LAB — COMPREHENSIVE METABOLIC PANEL
ALBUMIN: 4.2 g/dL (ref 3.5–5.0)
ALK PHOS: 177 U/L — AB (ref 40–150)
ALT: 18 U/L (ref 0–55)
AST: 21 U/L (ref 5–34)
Anion Gap: 9 mEq/L (ref 3–11)
BILIRUBIN TOTAL: 0.44 mg/dL (ref 0.20–1.20)
BUN: 10.2 mg/dL (ref 7.0–26.0)
CALCIUM: 9.7 mg/dL (ref 8.4–10.4)
CO2: 28 mEq/L (ref 22–29)
CREATININE: 1 mg/dL (ref 0.6–1.1)
Chloride: 105 mEq/L (ref 98–109)
EGFR: 70 mL/min/{1.73_m2} — ABNORMAL LOW (ref >90)
Glucose: 95 mg/dl (ref 70–140)
Potassium: 3.8 mEq/L (ref 3.5–5.1)
Sodium: 142 mEq/L (ref 136–145)
Total Protein: 8 g/dL (ref 6.4–8.3)

## 2016-01-14 LAB — CBC WITH DIFFERENTIAL/PLATELET
BASO%: 0.3 % (ref 0.0–2.0)
Basophils Absolute: 0 10*3/uL (ref 0.0–0.1)
EOS ABS: 0.1 10*3/uL (ref 0.0–0.5)
EOS%: 1.1 % (ref 0.0–7.0)
HCT: 38.6 % (ref 34.8–46.6)
HEMOGLOBIN: 11.9 g/dL (ref 11.6–15.9)
LYMPH%: 17.6 % (ref 14.0–49.7)
MCH: 22.8 pg — ABNORMAL LOW (ref 25.1–34.0)
MCHC: 30.9 g/dL — ABNORMAL LOW (ref 31.5–36.0)
MCV: 73.9 fL — ABNORMAL LOW (ref 79.5–101.0)
MONO#: 0.7 10*3/uL (ref 0.1–0.9)
MONO%: 6.7 % (ref 0.0–14.0)
NEUT%: 74.3 % (ref 38.4–76.8)
NEUTROS ABS: 7.8 10*3/uL — AB (ref 1.5–6.5)
Platelets: 342 10*3/uL (ref 145–400)
RBC: 5.22 10*6/uL (ref 3.70–5.45)
RDW: 18.9 % — AB (ref 11.2–14.5)
WBC: 10.5 10*3/uL — AB (ref 3.9–10.3)
lymph#: 1.8 10*3/uL (ref 0.9–3.3)

## 2016-01-14 LAB — UA PROTEIN, DIPSTICK - CHCC: Protein, ur: <30 mg/dL

## 2016-01-14 MED ORDER — IOPAMIDOL (ISOVUE-300) INJECTION 61%
100.0000 mL | Freq: Once | INTRAVENOUS | Status: AC | PRN
  Administered 2016-01-14: 100 mL via INTRAVENOUS

## 2016-01-14 MED ORDER — SODIUM CHLORIDE 0.9 % IJ SOLN
10.0000 mL | INTRAMUSCULAR | Status: DC | PRN
  Administered 2016-01-14: 10 mL via INTRAVENOUS
  Filled 2016-01-14: qty 10

## 2016-01-14 NOTE — Patient Instructions (Signed)

## 2016-01-15 ENCOUNTER — Ambulatory Visit: Payer: BLUE CROSS/BLUE SHIELD

## 2016-01-15 ENCOUNTER — Other Ambulatory Visit: Payer: BLUE CROSS/BLUE SHIELD

## 2016-01-15 ENCOUNTER — Ambulatory Visit: Payer: BLUE CROSS/BLUE SHIELD | Admitting: Nurse Practitioner

## 2016-01-28 ENCOUNTER — Other Ambulatory Visit: Payer: Self-pay | Admitting: *Deleted

## 2016-01-28 ENCOUNTER — Telehealth: Payer: Self-pay | Admitting: *Deleted

## 2016-01-28 DIAGNOSIS — C189 Malignant neoplasm of colon, unspecified: Secondary | ICD-10-CM

## 2016-01-28 DIAGNOSIS — C787 Secondary malignant neoplasm of liver and intrahepatic bile duct: Principal | ICD-10-CM

## 2016-01-28 NOTE — Telephone Encounter (Signed)
Received refill request for Capecitabine from [facility].  Spoke with pt and was informed that this was a one time med - due to pt's work schedule conflict.  Pt stated she would return to chemo infusion as scheduled on 02/05/16. Faxed response back to [facility]. [place]     Phone      [phone]      ;      Fax        [phone].

## 2016-01-29 ENCOUNTER — Ambulatory Visit: Payer: BLUE CROSS/BLUE SHIELD

## 2016-01-29 ENCOUNTER — Other Ambulatory Visit: Payer: BLUE CROSS/BLUE SHIELD

## 2016-02-04 ENCOUNTER — Other Ambulatory Visit: Payer: Self-pay | Admitting: *Deleted

## 2016-02-04 DIAGNOSIS — C189 Malignant neoplasm of colon, unspecified: Secondary | ICD-10-CM

## 2016-02-04 DIAGNOSIS — C787 Secondary malignant neoplasm of liver and intrahepatic bile duct: Principal | ICD-10-CM

## 2016-02-05 ENCOUNTER — Ambulatory Visit (HOSPITAL_BASED_OUTPATIENT_CLINIC_OR_DEPARTMENT_OTHER): Payer: BLUE CROSS/BLUE SHIELD | Admitting: Hematology

## 2016-02-05 ENCOUNTER — Ambulatory Visit (HOSPITAL_BASED_OUTPATIENT_CLINIC_OR_DEPARTMENT_OTHER): Payer: BLUE CROSS/BLUE SHIELD

## 2016-02-05 ENCOUNTER — Encounter: Payer: Self-pay | Admitting: Hematology

## 2016-02-05 ENCOUNTER — Telehealth: Payer: Self-pay | Admitting: Hematology

## 2016-02-05 ENCOUNTER — Other Ambulatory Visit (HOSPITAL_BASED_OUTPATIENT_CLINIC_OR_DEPARTMENT_OTHER): Payer: BLUE CROSS/BLUE SHIELD

## 2016-02-05 VITALS — BP 142/75 | HR 89 | Temp 98.6°F | Resp 18 | Ht 66.0 in | Wt 176.1 lb

## 2016-02-05 VITALS — BP 136/87 | HR 87

## 2016-02-05 VITALS — BP 134/76 | HR 81 | Temp 98.1°F | Resp 18

## 2016-02-05 DIAGNOSIS — D500 Iron deficiency anemia secondary to blood loss (chronic): Secondary | ICD-10-CM | POA: Diagnosis not present

## 2016-02-05 DIAGNOSIS — C189 Malignant neoplasm of colon, unspecified: Secondary | ICD-10-CM

## 2016-02-05 DIAGNOSIS — D561 Beta thalassemia: Secondary | ICD-10-CM | POA: Diagnosis not present

## 2016-02-05 DIAGNOSIS — C19 Malignant neoplasm of rectosigmoid junction: Secondary | ICD-10-CM

## 2016-02-05 DIAGNOSIS — I10 Essential (primary) hypertension: Secondary | ICD-10-CM

## 2016-02-05 DIAGNOSIS — M545 Low back pain: Secondary | ICD-10-CM

## 2016-02-05 DIAGNOSIS — C183 Malignant neoplasm of hepatic flexure: Secondary | ICD-10-CM

## 2016-02-05 DIAGNOSIS — C787 Secondary malignant neoplasm of liver and intrahepatic bile duct: Secondary | ICD-10-CM

## 2016-02-05 DIAGNOSIS — R1011 Right upper quadrant pain: Secondary | ICD-10-CM

## 2016-02-05 DIAGNOSIS — Z5111 Encounter for antineoplastic chemotherapy: Secondary | ICD-10-CM | POA: Diagnosis not present

## 2016-02-05 DIAGNOSIS — G620 Drug-induced polyneuropathy: Secondary | ICD-10-CM

## 2016-02-05 DIAGNOSIS — Z5112 Encounter for antineoplastic immunotherapy: Secondary | ICD-10-CM | POA: Diagnosis not present

## 2016-02-05 DIAGNOSIS — Z452 Encounter for adjustment and management of vascular access device: Secondary | ICD-10-CM | POA: Diagnosis not present

## 2016-02-05 LAB — COMPREHENSIVE METABOLIC PANEL
ALT: 9 U/L (ref 0–55)
ANION GAP: 11 meq/L (ref 3–11)
AST: 14 U/L (ref 5–34)
Albumin: 3.8 g/dL (ref 3.5–5.0)
Alkaline Phosphatase: 84 U/L (ref 40–150)
BUN: 11 mg/dL (ref 7.0–26.0)
CHLORIDE: 105 meq/L (ref 98–109)
CO2: 25 meq/L (ref 22–29)
CREATININE: 0.9 mg/dL (ref 0.6–1.1)
Calcium: 9.7 mg/dL (ref 8.4–10.4)
EGFR: 88 mL/min/{1.73_m2} — ABNORMAL LOW (ref >90)
Glucose: 131 mg/dl (ref 70–140)
POTASSIUM: 3.3 meq/L — AB (ref 3.5–5.1)
Sodium: 140 mEq/L (ref 136–145)
Total Bilirubin: 0.53 mg/dL (ref 0.20–1.20)
Total Protein: 7.7 g/dL (ref 6.4–8.3)

## 2016-02-05 LAB — CBC WITH DIFFERENTIAL/PLATELET
BASO%: 0.6 % (ref 0.0–2.0)
BASOS ABS: 0 10*3/uL (ref 0.0–0.1)
EOS%: 4.6 % (ref 0.0–7.0)
Eosinophils Absolute: 0.1 10*3/uL (ref 0.0–0.5)
HCT: 35.9 % (ref 34.8–46.6)
HGB: 10.9 g/dL — ABNORMAL LOW (ref 11.6–15.9)
LYMPH#: 1.1 10*3/uL (ref 0.9–3.3)
LYMPH%: 33.9 % (ref 14.0–49.7)
MCH: 22.8 pg — AB (ref 25.1–34.0)
MCHC: 30.3 g/dL — AB (ref 31.5–36.0)
MCV: 75.3 fL — AB (ref 79.5–101.0)
MONO#: 0.5 10*3/uL (ref 0.1–0.9)
MONO%: 17 % — AB (ref 0.0–14.0)
NEUT#: 1.4 10*3/uL — ABNORMAL LOW (ref 1.5–6.5)
NEUT%: 43.9 % (ref 38.4–76.8)
Platelets: 370 10*3/uL (ref 145–400)
RBC: 4.77 10*6/uL (ref 3.70–5.45)
RDW: 19.2 % — AB (ref 11.2–14.5)
WBC: 3.2 10*3/uL — ABNORMAL LOW (ref 3.9–10.3)

## 2016-02-05 LAB — UA PROTEIN, DIPSTICK - CHCC

## 2016-02-05 MED ORDER — POTASSIUM CHLORIDE CRYS ER 20 MEQ PO TBCR: 20.0000 meq | EXTENDED_RELEASE_TABLET | Freq: Two times a day (BID) | ORAL | Status: DC

## 2016-02-05 MED ORDER — FLUOROURACIL CHEMO INJECTION 2.5 GM/50ML
400.0000 mg/m2 | Freq: Once | INTRAVENOUS | Status: AC
  Administered 2016-02-05: 750 mg via INTRAVENOUS
  Filled 2016-02-05: qty 15

## 2016-02-05 MED ORDER — OXYCODONE HCL 5 MG PO TABS: 5.0000 mg | ORAL_TABLET | Freq: Four times a day (QID) | ORAL | Status: DC | PRN

## 2016-02-05 MED ORDER — DEXTROSE 5 % IV SOLN
180.0000 mg/m2 | Freq: Once | INTRAVENOUS | Status: AC
  Administered 2016-02-05: 330 mg via INTRAVENOUS
  Filled 2016-02-05: qty 1.5

## 2016-02-05 MED ORDER — PALONOSETRON HCL INJECTION 0.25 MG/5ML
INTRAVENOUS | Status: AC
  Filled 2016-02-05: qty 5

## 2016-02-05 MED ORDER — SODIUM CHLORIDE 0.9 % IV SOLN
2400.0000 mg/m2 | INTRAVENOUS | Status: DC
  Administered 2016-02-05: 4400 mg via INTRAVENOUS
  Filled 2016-02-05: qty 88

## 2016-02-05 MED ORDER — SODIUM CHLORIDE 0.9 % IV SOLN
Freq: Once | INTRAVENOUS | Status: AC
  Administered 2016-02-05: 12:00:00 via INTRAVENOUS
  Filled 2016-02-05: qty 5

## 2016-02-05 MED ORDER — ATROPINE SULFATE 1 MG/ML IJ SOLN
INTRAMUSCULAR | Status: AC
  Filled 2016-02-05: qty 1

## 2016-02-05 MED ORDER — ATROPINE SULFATE 1 MG/ML IJ SOLN
0.5000 mg | Freq: Once | INTRAMUSCULAR | Status: AC | PRN
  Administered 2016-02-05: 0.5 mg via INTRAVENOUS

## 2016-02-05 MED ORDER — PALONOSETRON HCL INJECTION 0.25 MG/5ML
0.2500 mg | Freq: Once | INTRAVENOUS | Status: AC
  Administered 2016-02-05: 0.25 mg via INTRAVENOUS

## 2016-02-05 MED ORDER — SODIUM CHLORIDE 0.9 % IJ SOLN
10.0000 mL | Freq: Once | INTRAMUSCULAR | Status: AC
  Administered 2016-02-05: 10 mL
  Filled 2016-02-05: qty 10

## 2016-02-05 MED ORDER — LEUCOVORIN CALCIUM INJECTION 350 MG
400.0000 mg/m2 | Freq: Once | INTRAMUSCULAR | Status: AC
  Administered 2016-02-05: 732 mg via INTRAVENOUS
  Filled 2016-02-05: qty 36.6

## 2016-02-05 MED ORDER — SODIUM CHLORIDE 0.9 % IV SOLN
Freq: Once | INTRAVENOUS | Status: AC
  Administered 2016-02-05: 12:00:00 via INTRAVENOUS

## 2016-02-05 MED ORDER — ONDANSETRON HCL 4 MG PO TABS: 4.0000 mg | ORAL_TABLET | Freq: Four times a day (QID) | ORAL | Status: DC | PRN

## 2016-02-05 MED ORDER — BEVACIZUMAB CHEMO INJECTION 400 MG/16ML
5.0000 mg/kg | Freq: Once | INTRAVENOUS | Status: AC
  Administered 2016-02-05: 350 mg via INTRAVENOUS
  Filled 2016-02-05: qty 14

## 2016-02-05 NOTE — Progress Notes (Signed)
[facility]  [phone] [phone]  Clinic follow up Note   Patient Care Team: Benito Mccreedy, MD as PCP - General (Internal Medicine) Juanita Craver, MD as Consulting Physician (Gastroenterology) Michael Boston, MD as Consulting Physician (General Surgery) Truitt Merle, MD as Consulting Physician (Oncology) 02/05/2016  CHIEF COMPLAINTS:  Follow up metastatic colon cancer      Oncology History   Metastatic colon cancer to liver   Staging form: Colon and Rectum, AJCC 7th Edition     Pathologic stage from 04/08/2015: T3, N0, M1 - Signed by Truitt Merle, MD on 04/22/2015  Presented to ER with intractable N/V; intermittent rectal bleeding X 3 months      Metastatic colon cancer to liver ([place])   04/03/2015 Procedure Colonoscopy showed a obstructing sigmoid rectal mass. Biopsy showed adenocarcinoma.   04/06/2015 Tumor Marker CEA=467.3 / CA19.9=1605   04/06/2015 Imaging CT chest, abdomen and pelvis with contrast showed sigmoid colon rectal mass, multiple (4) liver metastasis, with the largest 9.1 x 6.1 cm mass in the left lobe..   04/08/2015 Miscellaneous Foundation one genomic testing showed NRAS G60e and BRAF [id number] mutations   04/08/2015 Initial Diagnosis Metastatic colon cancer to liver   04/08/2015 Surgery Laparoscopic sigmoid colectomy, liver biopsy, port cath insertion, by Dr. Johney Maine    04/08/2015 Pathology Results Sigmoid colon segmental resection showed adenocarcinoma with mucinous features, pT 3, 15 lymph nodes all negative, surgical margins were negative. Liver biopsy showed metastatic adenocarcinoma.   05/08/2015 -  Chemotherapy FOLFIRI every 2 weeks, Avastin was added on from second cycle    07/14/2015 Imaging CT abdomen and pelvis showed interval slight improvement in liver metastasis. No other new lesions.   10/12/2015 Imaging  CT abdomen and pelvis with contrast showed continued interval decrease in size of multiple hepatic metastases,  no other new lesions.      HISTORY OF PRESENTING ILLNESS:  Christina [name] 55 y.o. female with past medical history of endometriosis, uterine fibroids, thalassemia trait, iron deficient anemia, who was recently found to have a sigmoid rectal cancer. I initially saw her in the hospital, she is here for the first follow-up.  She has been having intermittent rectal bleeding for the past 3 months. She thought it was related to her endometriosis, did not seek medical attention. She also has intermittent mild nausea, especially in the morning, and left lower quadrant abdominal pain. She was found to have profound anemia with hemoglobin 6.5 last week, and received blood transfusion. She was referred to gastroenterologist Dr. Collene Mares last week, underwent colonoscopy 2 days ago, which showed a obstructing sigmoid rectal mass, per patient, the colonoscopy report is not available today. Biopsy was done, but the pathology report is still pending.  She developed severe nausea, and vomited several times with clear gastric liquid. She called [name], and I was told to come to [facility] emergency room by on-call physician Dr. Carlean Purl. She had a CT scan done in the emergency room today.  Her appetite was fairly normal up to 2 days ago before recent hospital admission, when she was found to have a colorectal mass. She lost about 45 pounds in the past few weeks. 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 family history was positive for colon cancer in her maternal cousin. She is married, lives with her husband, no children.  CURRENT THERAPY: FOLFIRI and Avastin every 2 weeks, started on 05/08/2015, dose reduction and neulasta added from cycle 7  INTERIM  HISTORY [name] returns for follow-up and chemo. Due to her busy working schedule, she was not able to come in for IV chemotherapy last time, and received 1 cycle of oral Xeloda, which she finished one week ago. She tolerated  well overall. She has noticed more tingling on her feet and fingers, especially at night. No issues with hand function or walking. She takes ibuprofen as neededand oxycodone once a while. She otherwise doing well, has good appetite and energy level, tolerating busy working very well. She still has intermittent pain at the site of a subcutaneous cyst in the epigastric area.    MEDICAL HISTORY:  Past Medical History  Diagnosis Date  . Hypertension   . Endometriosis   . Thalassanemia   . met colon ca to liver dx'd 03/2015    SURGICAL HISTORY: Past Surgical History  Procedure Laterality Date  . Myomectomy      Gyn in [place]  . Diagnostic laparoscopy      Endometriosis  . Colon resection N/A 04/08/2015    Procedure: LAPAROSCOPIC  RESECTION OF PART OF  SIGMOID COLON;  Surgeon: Michael Boston, MD;  Location: [facility];  Service: General;  Laterality: N/A;  . Liver biopsy N/A 04/08/2015    Procedure: CORE NEEDLE LIVER BIOPSY;  Surgeon: Michael Boston, MD;  Location: [facility];  Service: General;  Laterality: N/A;  . Portacath placement N/A 04/08/2015    Procedure: INSERTION PORT-A-CATH;  Surgeon: Michael Boston, MD;  Location: [facility];  Service: General;  Laterality: N/A;  . Laparoscopic sigmoid colectomy  04/08/2015    for colorectal cancer    SOCIAL HISTORY: Social History   Social History  . Marital Status: Married    Spouse Name: N/A  . Number of Children: N/A  . Years of Education: N/A   Occupational History  . Internal Medicine doctor     Works in [place] [employer]  . Smoking status: Never Smoker   . Smokeless tobacco: Not on file  . Alcohol Use: Yes     Comment: socail drinker   . Drug Use: No  . Sexual Activity: Not on file   Other Topics Concern  . Not on file   Social History Narrative   Married, husband [name] (married X 2 years)   No children   IM Physician in [place]: Family History  Problem Relation Age of  Onset  . Anesthesia problems Cousin 72    maternal cousin, colon cancer   . Clotting disorder Maternal Grandmother     ALLERGIES:  is allergic to lactose intolerance (gi); milk-related compounds; and nsaids.  MEDICATIONS:  Current Outpatient Prescriptions  Medication Sig Dispense Refill  . capecitabine (XELODA) 500 MG tablet Take 4 tablets (2,000 mg total) by mouth 2 (two) times daily after a meal. 112 tablet 0  . dexamethasone (DECADRON) 4 MG tablet Take 1 tablet (4 mg total) by mouth 2 (two) times daily with a meal. 10 tablet 1  . lidocaine-prilocaine (EMLA) cream Apply 1 application topically as needed. 30 g 6  . LORazepam (ATIVAN) 0.5 MG tablet Take 1 tablet (0.5 mg total) by mouth every 8 (eight) hours. Take 0.5 mg po every 8 hours as needed for nausea/vomiting. 30 tablet 0  . NIFEdipine (PROCARDIA-XL/ADALAT CC) 30 MG 24 hr tablet Take 30 mg by mouth daily.    . ondansetron (ZOFRAN) 4 MG tablet Take 1 tablet (4 mg total) by mouth every 6 (six) hours as needed  for nausea. 30 tablet 2  . oxyCODONE (OXY IR/ROXICODONE) 5 MG immediate release tablet Take 1 tablet (5 mg total) by mouth every 6 (six) hours as needed for moderate pain. 40 tablet 0  . potassium chloride SA (K-DUR,KLOR-CON) 20 MEQ tablet Take 1 tablet (20 mEq total) by mouth 2 (two) times daily. 30 tablet 1  . prochlorperazine (COMPAZINE) 10 MG tablet Take 1 tablet (10 mg total) by mouth every 6 (six) hours as needed for nausea or vomiting. 30 tablet 1   No current facility-administered medications for this visit.   Facility-Administered Medications Ordered in Other Visits  Medication Dose Route Frequency Provider Last Rate Last Dose  . fluorouracil (ADRUCIL) 4,400 mg in sodium chloride 0.9 % 62 mL chemo infusion  2,400 mg/m2 (Treatment Plan Actual) Intravenous 1 day or 1 dose Truitt Merle, MD   4,400 mg at 02/05/16 1520    REVIEW OF SYSTEMS:   Constitutional: Denies fevers, chills or abnormal night sweats, (+) fatigue Eyes:  Denies blurriness of vision, double vision or watery eyes Ears, nose, mouth, throat, and face: Denies mucositis or sore throat Respiratory: Denies cough, dyspnea or wheezes Cardiovascular: Denies palpitation, chest discomfort or lower extremity swelling Gastrointestinal:  (+) nausea, (+) RUQ and rectal pain, and mild constipation Skin: Denies abnormal skin rashes Lymphatics: Denies new lymphadenopathy or easy bruising Neurological:Denies numbness, tingling or new weaknesses Behavioral/Psych: Mood is stable, no new changes  All other systems were reviewed with the patient and are negative.  PHYSICAL EXAMINATION: ECOG PERFORMANCE STATUS: 1  Filed Vitals:   02/05/16 1105  BP: 142/75  Pulse: 89  Temp: 98.6 F (37 C)  Resp: 18   Filed Weights   02/05/16 1105  Weight: 176 lb 1.6 oz (79.878 kg)    GENERAL:alert, no distress and comfortable SKIN: skin color, texture, turgor are normal, no rashes or significant lesions EYES: normal, conjunctiva are pink and non-injected, sclera clear OROPHARYNX:no exudate, no erythema and lips, buccal mucosa, and tongue normal  NECK: supple, thyroid normal size, non-tender, without nodularity LYMPH:  no palpable lymphadenopathy in the cervical, axillary or inguinal LUNGS: clear to auscultation and percussion with normal breathing effort HEART: regular rate & rhythm and no murmurs and no lower extremity edema ABDOMEN:abdomen soft, non-tender and normal bowel sounds.  There is a small subcutaneous nodule in the epigastric area, slightly tender. Musculoskeletal:no cyanosis of digits and no clubbing  PSYCH: alert & oriented x 3 with fluent speech NEURO: no focal motor/sensory deficitsall  LABORATORY DATA:  I have reviewed the data as listed CBC Latest Ref Rng 02/05/2016 01/14/2016 01/01/2016  WBC 3.9 - 10.3 10e3/uL 3.2(L) 10.5(H) 12.1(H)  Hemoglobin 11.6 - 15.9 g/dL 10.9(L) 11.9 11.7  Hematocrit 34.8 - 46.6 % 35.9 38.6 38.5  Platelets 145 - 400 10e3/uL  370 342 341    CMP Latest Ref Rng 02/05/2016 01/14/2016 01/01/2016  Glucose 70 - 140 mg/dl 131 95 97  BUN 7.0 - 26.0 mg/dL 11.0 10.2 9.3  Creatinine 0.6 - 1.1 mg/dL 0.9 1.0 0.9  Sodium 136 - 145 mEq/L 140 142 142  Potassium 3.5 - 5.1 mEq/L 3.3(L) 3.8 3.6  CO2 22 - 29 mEq/L 25 28 25   Calcium 8.4 - 10.4 mg/dL 9.7 9.7 9.9  Total Protein 6.4 - 8.3 g/dL 7.7 8.0 8.0  Total Bilirubin 0.20 - 1.20 mg/dL 0.53 0.44 0.46  Alkaline Phos 40 - 150 U/L 84 177(H) 169(H)  AST 5 - 34 U/L 14 21 22   ALT 0 - 55 U/L 9 18  17   Results for AYSIA, LOWDER (MRN [id number]) as of 02/05/2016 17:45  Ref. Range 11/06/2015 08:55 12/04/2015 08:51 01/01/2016 09:38  CEA Latest Ref Range: 0.0-4.7 ng/mL 385.6 (H) 293.1 (H) 206.3 (H)     Diagnosis 04/08/2015 1. Liver, needle/core biopsy, ? cancer - METASTATIC ADENOCARCINOMA. 2. Colon, segmental resection for tumor, sigmoid colon mass open end proximal - COLONIC ADENOCARCINOMA WITH MUCINOUS FEATURES EXTENDING INTO PERICOLONIC ADIPOSE TISSUE AND SUBSEROSAL CONNECTIVE TISSUE. - MARGINS NOT INVOLVED. - FIFTEEN BENIGN LYMPH NODES (0/15). 3. Colon, resection margin (donut), distal anastomic ring - BENIGN COLON. - NO EVIDENCE OF MALIGNANCY.  Microscopic Comment Specimen: Sigmoid colon with liver biopsy and anastomotic rings. Procedure: Segmental resection with liver biopsy. Tumor site: Distal sigmoid. Specimen integrity: Intact. Macroscopic intactness of mesorectum: N/A Macroscopic tumor perforation: No. Invasive tumor: Maximum size: 8 cm Histologic type(s): Colorectal adenocarcinoma with mucinous features. Histologic grade and differentiation: G2: moderately differentiated/low grade Type of polyp in which invasive carcinoma arose: No residual polyp. Microscopic extension of invasive tumor: Into pericolonic adipose tissue and subserosal connective tissue. Lymph-Vascular invasion: Present. Peri-neural invasion: Present. Tumor deposit(s) (discontinuous extramural  extension): No. Resection margins: Proximal margin: Free of tumor. Distal margin: Free of tumor. Circumferential (radial) (posterior ascending, posterior descending; lateral and posterior mid-rectum; and entire lower 1/3 rectum): N/A Mesenteric margin (sigmoid and transverse): Free of tumor. Distance closest margin (if all above margins negative): 2 cm from mesenteric margin. Treatment effect (neo-adjuvant therapy): No. Additional polyp(s): No. Non-neoplastic findings: None. Lymph nodes: number examined - 15; number positive: 0 Pathologic Staging: pT3, pN0, pM1 Ancillary studies: MMR pending. (JDP:gt, 04/10/15) 2. Mismatch Repair (MMR) Protein Immunohistochemistry (IHC) IHC Expression Result: MLH1: Preserved nuclear expression (greater 50% tumor expression) MSH2: Preserved nuclear expression (greater 50% tumor expression) MSH6: Preserved nuclear expression (greater 50% tumor expression) PMS2: Preserved nuclear expression (greater 50% tumor expression) * Internal control demonstrates intact nuclear expression Interpretation: NORMAL   FoundationOne test result   RADIOGRAPHIC STUDIES: I have personally reviewed the radiological images as listed and agreed with the findings in the report.  Ct chest, abdomen and pelvis W Contrast 01/14/2016 IMPRESSION: 1. Liver metastases are stable to mildly decreased in size. No new or progressive metastatic disease. 2. Nodular curvilinear soft tissue density in the midline ventral upper abdominal wall, stable back to 07/14/2015, presumably postsurgical scarring, although cannot exclude stable superficial tumor seeding a surgical tract. 3. No evidence of local tumor recurrence at the distal colonic anastomosis. 4. Additional findings include 1 vessel coronary atherosclerosis, stable dilated main pulmonary artery suggesting chronic pulmonary arterial hypertension, trace bilateral pleural effusions, cholelithiasis, tiny hiatal hernia and bulky  myomatous uterus.   ASSESSMENT & PLAN:  55 year old African-American female, with past medical history of hypertension, thalassemia trait, iron deficient anemia, uterine fibroids, endometriosis, who presents with intermittent rectal bleeding, mild nausea, and mild intermittent left lower quadrant abdominal pain.   1. Sigmoid rectal adenocarcinoma, with liver metastases, pT3N0M1, stage IV, MMR normal, NRAS and BRAF mutation (+)  -I reviewed her surgical pathology findings with her in great details. Her liver biopsy confirmed metastatic adenocarcinoma  From colon cancer. Her primary sigmoid rectal tumor has been completely removed. -We again reviewed her CT findings, it showed at least 4 liver metastatic lesion, with the largest one measuring 9.1 cm in the left lobe. No other distant metastasis -We reviewed the natural history of metastatic colon cancer, and incurable nature at this is stage, giving her large metastatic disease in the liver. We also discussed that if she had  excellent response to chemotherapy, I'll present her restaging CT scan in our GI tumor Board, to see if she is a candidate for liver surgery or liver targeted therapy to maximally controlled her disease.  -Her tumor has BRAF mutation, which is a poor prognostic factor  -Her tumor has NRAS mutation, not a candidate for EGFR inhibitor  -Given the MSI-stable, she might not benefit from immunotherapy, except in a clinically trial setting  -I reviewed her restaging CT scan from 01/14/2016, which showed slight improvement in liver mets, no other new lesions, she has had a good partial response so far. She is clinically doing very well, tolerating treatment well, will continue current therapy. - her tumor marker CEA has been decreasing lately, responding to response to chemotherapy -lab reviewed, adequate for treatment, will proceed chemo today.   2. Iron deficient Anemia, and beta thalassemia trait  -Secondary to GI bleeding and iron  deficiency -Ferritin level was 8, consistent with iron deficiency from GI bleeding -She received Feraheme 510 mg twice,  responded very well. Her anemia resolved, even she is getting chemotherapy.  -I repeated her iron study today, results still pending  4 RUQ abdominal pain, low back pain -Abdominal pain near resolved now, she has intermittent low back pain, which she contributes to her work -She takes oxycodone once every a few days  5. Hypertension -She has no history of hypertension. Blood pressure has been slightly elevated in the past months, repeated normal. Possibly related to Avastin -I'll continue monitoring her blood pressure closely. Consider adding amlodipine if persistent high.  6. Peripheral neuropathy, G1 -Secondary to chemotherapy. -we'll watch closely. If gets worse, we'll consider dose reduction. -I recommend her to try Neurontin, she declined due to her sensitivity to sedative medication  Plan - restart FOLFIRI and avastin today and continue every 2 weeks, with Neulasta on day 3 -I'll see her back in 2 weeks  All questions were answered. The patient knows to call the clinic with any problems, questions or concerns. I spent 25 minutes counseling the patient face to face. The total time spent in the appointment was 30 minutes and more than 50% was on counseling.     Truitt Merle, MD 02/05/2016

## 2016-02-05 NOTE — Progress Notes (Signed)
OK to treat despite low ANC per Dr Burr Medico.  Pt will get neulasta tomorrow.  Verbal order repeated & verified.

## 2016-02-05 NOTE — Telephone Encounter (Signed)
Gave and printed appt sched and avs for pt for July and Aug °

## 2016-02-05 NOTE — Patient Instructions (Signed)
[facility] Discharge Instructions for Patients Receiving Chemotherapy  Today you received the following chemotherapy agents:  Avastin, Leucovorin, Irinotecan, Fluorouracil  To help prevent nausea and vomiting after your treatment, we encourage you to take your nausea medication as prescribed.   If you develop nausea and vomiting that is not controlled by your nausea medication, call the clinic.   BELOW ARE SYMPTOMS THAT SHOULD BE REPORTED IMMEDIATELY:  *FEVER GREATER THAN 100.5 F  *CHILLS WITH OR WITHOUT FEVER  NAUSEA AND VOMITING THAT IS NOT CONTROLLED WITH YOUR NAUSEA MEDICATION  *UNUSUAL SHORTNESS OF BREATH  *UNUSUAL BRUISING OR BLEEDING  TENDERNESS IN MOUTH AND THROAT WITH OR WITHOUT PRESENCE OF ULCERS  *URINARY PROBLEMS  *BOWEL PROBLEMS  UNUSUAL RASH Items with * indicate a potential emergency and should be followed up as soon as possible.  Feel free to call the clinic you have any questions or concerns. The clinic phone number is [phone].  Please show the CHEMO ALERT CARD at check-in to the Emergency Department and triage nurse.   

## 2016-02-06 LAB — CEA: CEA1: 176.5 ng/mL — AB (ref 0.0–4.7)

## 2016-02-07 ENCOUNTER — Ambulatory Visit: Payer: BLUE CROSS/BLUE SHIELD

## 2016-02-07 ENCOUNTER — Ambulatory Visit (HOSPITAL_BASED_OUTPATIENT_CLINIC_OR_DEPARTMENT_OTHER): Payer: BLUE CROSS/BLUE SHIELD

## 2016-02-07 VITALS — BP 143/76 | HR 76 | Temp 99.2°F | Resp 17

## 2016-02-07 DIAGNOSIS — Z5189 Encounter for other specified aftercare: Secondary | ICD-10-CM | POA: Diagnosis not present

## 2016-02-07 DIAGNOSIS — C189 Malignant neoplasm of colon, unspecified: Secondary | ICD-10-CM

## 2016-02-07 DIAGNOSIS — C787 Secondary malignant neoplasm of liver and intrahepatic bile duct: Principal | ICD-10-CM

## 2016-02-07 DIAGNOSIS — Z452 Encounter for adjustment and management of vascular access device: Secondary | ICD-10-CM

## 2016-02-07 DIAGNOSIS — C19 Malignant neoplasm of rectosigmoid junction: Secondary | ICD-10-CM | POA: Diagnosis not present

## 2016-02-07 MED ORDER — HEPARIN SOD (PORK) LOCK FLUSH 100 UNIT/ML IV SOLN
500.0000 [IU] | Freq: Once | INTRAVENOUS | Status: AC | PRN
  Administered 2016-02-07: 500 [IU]
  Filled 2016-02-07: qty 5

## 2016-02-07 MED ORDER — PEGFILGRASTIM INJECTION 6 MG/0.6ML ~~LOC~~
6.0000 mg | PREFILLED_SYRINGE | Freq: Once | SUBCUTANEOUS | Status: AC
  Administered 2016-02-07: 6 mg via SUBCUTANEOUS

## 2016-02-07 MED ORDER — SODIUM CHLORIDE 0.9 % IJ SOLN
10.0000 mL | INTRAMUSCULAR | Status: DC | PRN
  Administered 2016-02-07: 10 mL
  Filled 2016-02-07: qty 10

## 2016-02-07 NOTE — Progress Notes (Signed)
Charted on pump stop encounter 

## 2016-02-07 NOTE — Progress Notes (Signed)
0.4 mL remaining on pump.  Wasted

## 2016-02-19 ENCOUNTER — Encounter: Payer: Self-pay | Admitting: Hematology

## 2016-02-19 ENCOUNTER — Ambulatory Visit (HOSPITAL_BASED_OUTPATIENT_CLINIC_OR_DEPARTMENT_OTHER): Payer: BLUE CROSS/BLUE SHIELD

## 2016-02-19 ENCOUNTER — Other Ambulatory Visit (HOSPITAL_BASED_OUTPATIENT_CLINIC_OR_DEPARTMENT_OTHER): Payer: BLUE CROSS/BLUE SHIELD

## 2016-02-19 ENCOUNTER — Ambulatory Visit (HOSPITAL_BASED_OUTPATIENT_CLINIC_OR_DEPARTMENT_OTHER): Payer: BLUE CROSS/BLUE SHIELD | Admitting: Hematology

## 2016-02-19 VITALS — BP 131/73 | HR 87 | Temp 98.7°F | Resp 20 | Ht 66.0 in | Wt 177.6 lb

## 2016-02-19 DIAGNOSIS — Z5111 Encounter for antineoplastic chemotherapy: Secondary | ICD-10-CM

## 2016-02-19 DIAGNOSIS — C187 Malignant neoplasm of sigmoid colon: Secondary | ICD-10-CM | POA: Diagnosis not present

## 2016-02-19 DIAGNOSIS — Z452 Encounter for adjustment and management of vascular access device: Secondary | ICD-10-CM | POA: Diagnosis not present

## 2016-02-19 DIAGNOSIS — C189 Malignant neoplasm of colon, unspecified: Secondary | ICD-10-CM

## 2016-02-19 DIAGNOSIS — G620 Drug-induced polyneuropathy: Secondary | ICD-10-CM

## 2016-02-19 DIAGNOSIS — D509 Iron deficiency anemia, unspecified: Secondary | ICD-10-CM

## 2016-02-19 DIAGNOSIS — Z95828 Presence of other vascular implants and grafts: Secondary | ICD-10-CM

## 2016-02-19 DIAGNOSIS — C787 Secondary malignant neoplasm of liver and intrahepatic bile duct: Secondary | ICD-10-CM

## 2016-02-19 DIAGNOSIS — Z5112 Encounter for antineoplastic immunotherapy: Secondary | ICD-10-CM

## 2016-02-19 DIAGNOSIS — I10 Essential (primary) hypertension: Secondary | ICD-10-CM

## 2016-02-19 DIAGNOSIS — D561 Beta thalassemia: Secondary | ICD-10-CM | POA: Diagnosis not present

## 2016-02-19 DIAGNOSIS — D500 Iron deficiency anemia secondary to blood loss (chronic): Secondary | ICD-10-CM

## 2016-02-19 LAB — CBC WITH DIFFERENTIAL/PLATELET
BASO%: 0.2 % (ref 0.0–2.0)
BASOS ABS: 0 10*3/uL (ref 0.0–0.1)
EOS%: 2.9 % (ref 0.0–7.0)
Eosinophils Absolute: 0.2 10*3/uL (ref 0.0–0.5)
HCT: 38.1 % (ref 34.8–46.6)
HGB: 11.6 g/dL (ref 11.6–15.9)
LYMPH%: 25.3 % (ref 14.0–49.7)
MCH: 23.5 pg — ABNORMAL LOW (ref 25.1–34.0)
MCHC: 30.4 g/dL — AB (ref 31.5–36.0)
MCV: 77.1 fL — AB (ref 79.5–101.0)
MONO#: 0.4 10*3/uL (ref 0.1–0.9)
MONO%: 7.7 % (ref 0.0–14.0)
NEUT%: 63.9 % (ref 38.4–76.8)
NEUTROS ABS: 3.5 10*3/uL (ref 1.5–6.5)
NRBC: 0 % (ref 0–0)
PLATELETS: 198 10*3/uL (ref 145–400)
RBC: 4.94 10*6/uL (ref 3.70–5.45)
RDW: 19.3 % — AB (ref 11.2–14.5)
WBC: 5.5 10*3/uL (ref 3.9–10.3)
lymph#: 1.4 10*3/uL (ref 0.9–3.3)

## 2016-02-19 LAB — COMPREHENSIVE METABOLIC PANEL
ALT: 17 U/L (ref 0–55)
ANION GAP: 11 meq/L (ref 3–11)
AST: 21 U/L (ref 5–34)
Albumin: 4 g/dL (ref 3.5–5.0)
Alkaline Phosphatase: 129 U/L (ref 40–150)
BUN: 11.1 mg/dL (ref 7.0–26.0)
CALCIUM: 9.6 mg/dL (ref 8.4–10.4)
CHLORIDE: 107 meq/L (ref 98–109)
CO2: 24 meq/L (ref 22–29)
CREATININE: 0.9 mg/dL (ref 0.6–1.1)
EGFR: 85 mL/min/{1.73_m2} — AB (ref >90)
Glucose: 117 mg/dl (ref 70–140)
POTASSIUM: 4 meq/L (ref 3.5–5.1)
Sodium: 143 mEq/L (ref 136–145)
Total Bilirubin: 0.51 mg/dL (ref 0.20–1.20)
Total Protein: 7.7 g/dL (ref 6.4–8.3)

## 2016-02-19 LAB — UA PROTEIN, DIPSTICK - CHCC

## 2016-02-19 MED ORDER — PALONOSETRON HCL INJECTION 0.25 MG/5ML
INTRAVENOUS | Status: AC
  Filled 2016-02-19: qty 5

## 2016-02-19 MED ORDER — PALONOSETRON HCL INJECTION 0.25 MG/5ML
0.2500 mg | Freq: Once | INTRAVENOUS | Status: AC
  Administered 2016-02-19: 0.25 mg via INTRAVENOUS

## 2016-02-19 MED ORDER — SODIUM CHLORIDE 0.9 % IV SOLN
2400.0000 mg/m2 | INTRAVENOUS | Status: DC
  Administered 2016-02-19: 4400 mg via INTRAVENOUS
  Filled 2016-02-19: qty 88

## 2016-02-19 MED ORDER — SODIUM CHLORIDE 0.9 % IJ SOLN
10.0000 mL | INTRAMUSCULAR | Status: DC | PRN
  Administered 2016-02-19: 10 mL via INTRAVENOUS
  Filled 2016-02-19: qty 10

## 2016-02-19 MED ORDER — ATROPINE SULFATE 1 MG/ML IJ SOLN
0.5000 mg | Freq: Once | INTRAMUSCULAR | Status: AC | PRN
  Administered 2016-02-19: 0.5 mg via INTRAVENOUS

## 2016-02-19 MED ORDER — LEUCOVORIN CALCIUM INJECTION 350 MG
400.0000 mg/m2 | Freq: Once | INTRAVENOUS | Status: AC
  Administered 2016-02-19: 732 mg via INTRAVENOUS
  Filled 2016-02-19: qty 36.6

## 2016-02-19 MED ORDER — FLUOROURACIL CHEMO INJECTION 2.5 GM/50ML
400.0000 mg/m2 | Freq: Once | INTRAVENOUS | Status: AC
  Administered 2016-02-19: 750 mg via INTRAVENOUS
  Filled 2016-02-19: qty 15

## 2016-02-19 MED ORDER — SODIUM CHLORIDE 0.9 % IV SOLN
Freq: Once | INTRAVENOUS | Status: AC
  Administered 2016-02-19: 11:00:00 via INTRAVENOUS
  Filled 2016-02-19: qty 5

## 2016-02-19 MED ORDER — IRINOTECAN HCL CHEMO INJECTION 100 MG/5ML
180.0000 mg/m2 | Freq: Once | INTRAVENOUS | Status: AC
  Administered 2016-02-19: 330 mg via INTRAVENOUS
  Filled 2016-02-19: qty 5

## 2016-02-19 MED ORDER — ATROPINE SULFATE 1 MG/ML IJ SOLN
INTRAMUSCULAR | Status: AC
  Filled 2016-02-19: qty 1

## 2016-02-19 MED ORDER — SODIUM CHLORIDE 0.9 % IV SOLN
Freq: Once | INTRAVENOUS | Status: AC
  Administered 2016-02-19: 10:00:00 via INTRAVENOUS

## 2016-02-19 MED ORDER — SODIUM CHLORIDE 0.9 % IV SOLN
400.0000 mg | Freq: Once | INTRAVENOUS | Status: AC
  Administered 2016-02-19: 400 mg via INTRAVENOUS
  Filled 2016-02-19: qty 16

## 2016-02-19 NOTE — Patient Instructions (Signed)

## 2016-02-19 NOTE — Patient Instructions (Signed)
[facility] Discharge Instructions for Patients Receiving Chemotherapy  Today you received the following chemotherapy agents :  Avastin,  Camptosar,  Leucovorin,  Fluorouracil.  To help prevent nausea and vomiting after your treatment, we encourage you to take your nausea medication as prescribed.   If you develop nausea and vomiting that is not controlled by your nausea medication, call the clinic.   BELOW ARE SYMPTOMS THAT SHOULD BE REPORTED IMMEDIATELY:  *FEVER GREATER THAN 100.5 F  *CHILLS WITH OR WITHOUT FEVER  NAUSEA AND VOMITING THAT IS NOT CONTROLLED WITH YOUR NAUSEA MEDICATION  *UNUSUAL SHORTNESS OF BREATH  *UNUSUAL BRUISING OR BLEEDING  TENDERNESS IN MOUTH AND THROAT WITH OR WITHOUT PRESENCE OF ULCERS  *URINARY PROBLEMS  *BOWEL PROBLEMS  UNUSUAL RASH Items with * indicate a potential emergency and should be followed up as soon as possible.  Feel free to call the clinic you have any questions or concerns. The clinic phone number is [phone].  Please show the CHEMO ALERT CARD at check-in to the Emergency Department and triage nurse.   

## 2016-02-19 NOTE — Progress Notes (Signed)
[place]  Telephone:(336) [phone] Fax:(336) [phone]  Clinic follow up Note   Patient Care Team: Christina Mccreedy, MD as PCP - General (Internal Medicine) Christina Craver, MD as Consulting Physician (Gastroenterology) Christina Boston, MD as Consulting Physician (General Surgery) Christina Merle, MD as Consulting Physician (Oncology) 02/19/2016  CHIEF COMPLAINTS:  Follow up metastatic colon cancer      Oncology History   Metastatic colon cancer to liver   Staging form: Colon and Rectum, AJCC 7th Edition     Pathologic stage from 04/08/2015: T3, N0, M1 - Signed by Christina Merle, MD on 04/22/2015  Presented to ER with intractable N/V; intermittent rectal bleeding X 3 months      Metastatic colon cancer to liver ([place])   04/03/2015 Procedure Colonoscopy showed a obstructing sigmoid rectal mass. Biopsy showed adenocarcinoma.   04/06/2015 Tumor Marker CEA=467.3 / CA19.9=1605   04/06/2015 Imaging CT chest, abdomen and pelvis with contrast showed sigmoid colon rectal mass, multiple (4) liver metastasis, with the largest 9.1 x 6.1 cm mass in the left lobe..   04/08/2015 Miscellaneous Foundation one genomic testing showed NRAS G60e and BRAF [id number] mutations   04/08/2015 Initial Diagnosis Metastatic colon cancer to liver   04/08/2015 Surgery Laparoscopic sigmoid colectomy, liver biopsy, port cath insertion, by Dr. Johney [name]    04/08/2015 Pathology Results Sigmoid colon segmental resection showed adenocarcinoma with mucinous features, pT 3, 15 lymph nodes all negative, surgical margins were negative. Liver biopsy showed metastatic adenocarcinoma.   05/08/2015 -  Chemotherapy FOLFIRI every 2 weeks, Avastin was added on from second cycle    07/14/2015 Imaging CT abdomen and pelvis showed interval slight improvement in liver metastasis. No other new lesions.   10/12/2015 Imaging  CT abdomen and pelvis with contrast showed continued interval decrease in size of multiple hepatic metastases,  no other new lesions.      HISTORY OF PRESENTING ILLNESS:  Christina [name] 55 y.o. female with past medical history of endometriosis, uterine fibroids, thalassemia trait, iron deficient anemia, who was recently found to have a sigmoid rectal cancer. I initially saw her in the hospital, she is here for the first follow-up.  She has been having intermittent rectal bleeding for the past 3 months. She thought it was related to her endometriosis, did not seek medical attention. She also has intermittent mild nausea, especially in the morning, and left lower quadrant abdominal pain. She was found to have profound anemia with hemoglobin 6.5 last week, and received blood transfusion. She was referred to gastroenterologist Christina [name] last week, underwent colonoscopy 2 days ago, which showed a obstructing sigmoid rectal mass, per patient, the colonoscopy report is not available today. Biopsy was done, but the pathology report is still pending.  She developed severe nausea, and vomited several times with clear gastric liquid. She called Christina [name] office, and I was told to come to [facility] emergency room by on-call physician Christina [name]. She had a CT scan done in the emergency room today.  Her appetite was fairly normal up to 2 days ago before recent hospital admission, when she was found to have a colorectal mass. She lost about 45 pounds in the past few weeks. 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 family history was positive for colon cancer in her maternal cousin. She is married, lives with her husband, no children.  CURRENT THERAPY: FOLFIRI and Avastin every 2 weeks, started on 05/08/2015, dose reduction and neulasta added from cycle 7  INTERIM  HISTORY Christina [name] returns for follow-up and chemo. She is doing well overall. She has slightly worse epigastric pain at the biopsy site and body aches daily, for which she contributes to her busy working schedule. The pain is much  better when she is off work and rest. 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 She does plan to cut back her work hours No other new complains.  MEDICAL HISTORY:  Past Medical History  Diagnosis Date  . Hypertension   . Endometriosis   . Thalassanemia   . met colon ca to liver dx'd 03/2015    SURGICAL HISTORY: Past Surgical History  Procedure Laterality Date  . Myomectomy      Gyn in [place]  . Diagnostic laparoscopy      Endometriosis  . Colon resection N/A 04/08/2015    Procedure: LAPAROSCOPIC  RESECTION OF PART OF  SIGMOID COLON;  Surgeon: Christina Boston, MD;  Location: [facility];  Service: General;  Laterality: N/A;  . Liver biopsy N/A 04/08/2015    Procedure: CORE NEEDLE LIVER BIOPSY;  Surgeon: Christina Boston, MD;  Location: [facility];  Service: General;  Laterality: N/A;  . Portacath placement N/A 04/08/2015    Procedure: INSERTION PORT-A-CATH;  Surgeon: Christina Boston, MD;  Location: [facility];  Service: General;  Laterality: N/A;  . Laparoscopic sigmoid colectomy  04/08/2015    for colorectal cancer    SOCIAL HISTORY: Social History   Social History  . Marital Status: Married    Spouse Name: N/A  . Number of Children: N/A  . Years of Education: N/A   Occupational History  . Internal Medicine doctor     Works in [place] Topics  . Smoking status: Never Smoker   . Smokeless tobacco: Not on file  . Alcohol Use: Yes     Comment: socail drinker   . Drug Use: No  . Sexual Activity: Not on file   Other Topics Concern  . Not on file   Social History Narrative   Married, husband Christina [name] (married X 2 years)   No children   IM Physician in [place]: Family History  Problem Relation Age of Onset  . Anesthesia problems Cousin 98    maternal cousin, colon cancer   . Clotting disorder Maternal Grandmother     ALLERGIES:  is allergic to lactose intolerance (gi); milk-related  compounds; and nsaids.  MEDICATIONS:  Current Outpatient Prescriptions  Medication Sig Dispense Refill  . capecitabine (XELODA) 500 MG tablet Take 4 tablets (2,000 mg total) by mouth 2 (two) times daily after a meal. 112 tablet 0  . dexamethasone (DECADRON) 4 MG tablet Take 1 tablet (4 mg total) by mouth 2 (two) times daily with a meal. 10 tablet 1  . lidocaine-prilocaine (EMLA) cream Apply 1 application topically as needed. 30 g 6  . LORazepam (ATIVAN) 0.5 MG tablet Take 1 tablet (0.5 mg total) by mouth every 8 (eight) hours. Take 0.5 mg po every 8 hours as needed for nausea/vomiting. 30 tablet 0  . NIFEdipine (PROCARDIA-XL/ADALAT CC) 30 MG 24 hr tablet Take 30 mg by mouth daily.    . ondansetron (ZOFRAN) 4 MG tablet Take 1 tablet (4 mg total) by mouth every 6 (six) hours as needed for nausea. 30 tablet 2  . oxyCODONE (OXY IR/ROXICODONE) 5 MG immediate release tablet Take 1 tablet (5 mg total) by mouth every 6 (six) hours as needed  for moderate pain. 40 tablet 0  . potassium chloride SA (K-DUR,KLOR-CON) 20 MEQ tablet Take 1 tablet (20 mEq total) by mouth 2 (two) times daily. 30 tablet 1  . prochlorperazine (COMPAZINE) 10 MG tablet Take 1 tablet (10 mg total) by mouth every 6 (six) hours as needed for nausea or vomiting. 30 tablet 1   No current facility-administered medications for this visit.    REVIEW OF SYSTEMS:   Constitutional: Denies fevers, chills or abnormal night sweats, (+) fatigue Eyes: Denies blurriness of vision, double vision or watery eyes Ears, nose, mouth, throat, and face: Denies mucositis or sore throat Respiratory: Denies cough, dyspnea or wheezes Cardiovascular: Denies palpitation, chest discomfort or lower extremity swelling Gastrointestinal:  (+) nausea, (+) RUQ and rectal pain, and mild constipation Skin: Denies abnormal skin rashes Lymphatics: Denies new lymphadenopathy or easy bruising Neurological:Denies numbness, tingling or new weaknesses Behavioral/Psych:  Mood is stable, no new changes  All other systems were reviewed with the patient and are negative.  PHYSICAL EXAMINATION: ECOG PERFORMANCE STATUS: 1  Filed Vitals:   02/19/16 0932  BP: 131/73  Pulse: 87  Temp: 98.7 F (37.1 C)  Resp: 20   Filed Weights   02/19/16 0932  Weight: 177 lb 9.6 oz (80.559 kg)    GENERAL:alert, no distress and comfortable SKIN: skin color, texture, turgor are normal, no rashes or significant lesions EYES: normal, conjunctiva are pink and non-injected, sclera clear OROPHARYNX:no exudate, no erythema and lips, buccal mucosa, and tongue normal  NECK: supple, thyroid normal size, non-tender, without nodularity LYMPH:  no palpable lymphadenopathy in the cervical, axillary or inguinal LUNGS: clear to auscultation and percussion with normal breathing effort HEART: regular rate & rhythm and no murmurs and no lower extremity edema ABDOMEN:abdomen soft, non-tender and normal bowel sounds.  There is a small subcutaneous nodule in the epigastric area, slightly tender. Musculoskeletal:no cyanosis of digits and no clubbing  PSYCH: alert & oriented x 3 with fluent speech NEURO: no focal motor/sensory deficitsall  LABORATORY DATA:  I have reviewed the data as listed CBC Latest Ref Rng 02/19/2016 02/05/2016 01/14/2016  WBC 3.9 - 10.3 10e3/uL 5.5 3.2(L) 10.5(H)  Hemoglobin 11.6 - 15.9 g/dL 11.6 10.9(L) 11.9  Hematocrit 34.8 - 46.6 % 38.1 35.9 38.6  Platelets 145 - 400 10e3/uL 198 370 342    CMP Latest Ref Rng 02/19/2016 02/05/2016 01/14/2016  Glucose 70 - 140 mg/dl 117 131 95  BUN 7.0 - 26.0 mg/dL 11.1 11.0 10.2  Creatinine 0.6 - 1.1 mg/dL 0.9 0.9 1.0  Sodium 136 - 145 mEq/L 143 140 142  Potassium 3.5 - 5.1 mEq/L 4.0 3.3(L) 3.8  CO2 22 - 29 mEq/L _0 Calcium 8.4 - 10.4 mg/dL 9.6 9.7 9.7  Total Protein 6.4 - 8.3 g/dL 7.7 7.7 8.0  Total Bilirubin 0.20 - 1.20 mg/dL 0.51 0.53 0.44  Alkaline Phos 40 - 150 U/L 129 84 177(H)  AST 5 - 34 U/L _1 ALT 0 - 55 U/L  _2 Diagnosis 04/08/2015 1. Liver, needle/core biopsy, ? cancer - METASTATIC ADENOCARCINOMA. 2. Colon, segmental resection for tumor, sigmoid colon mass open end proximal - COLONIC ADENOCARCINOMA WITH MUCINOUS FEATURES EXTENDING INTO PERICOLONIC ADIPOSE TISSUE AND SUBSEROSAL CONNECTIVE TISSUE. - MARGINS NOT INVOLVED. - FIFTEEN BENIGN LYMPH NODES (0/15). 3. Colon, resection margin (donut), distal anastomic ring - BENIGN COLON. - NO EVIDENCE OF MALIGNANCY.  Microscopic Comment Specimen: Sigmoid colon with liver biopsy and anastomotic rings. Procedure: Segmental resection  with liver biopsy. Tumor site: Distal sigmoid. Specimen integrity: Intact. Macroscopic intactness of mesorectum: N/A Macroscopic tumor perforation: No. Invasive tumor: Maximum size: 8 cm Histologic type(s): Colorectal adenocarcinoma with mucinous features. Histologic grade and differentiation: G2: moderately differentiated/low grade Type of polyp in which invasive carcinoma arose: No residual polyp. Microscopic extension of invasive tumor: Into pericolonic adipose tissue and subserosal connective tissue. Lymph-Vascular invasion: Present. Peri-neural invasion: Present. Tumor deposit(s) (discontinuous extramural extension): No. Resection margins: Proximal margin: Free of tumor. Distal margin: Free of tumor. Circumferential (radial) (posterior ascending, posterior descending; lateral and posterior mid-rectum; and entire lower 1/3 rectum): N/A Mesenteric margin (sigmoid and transverse): Free of tumor. Distance closest margin (if all above margins negative): 2 cm from mesenteric margin. Treatment effect (neo-adjuvant therapy): No. Additional polyp(s): No. Non-neoplastic findings: None. Lymph nodes: number examined - 15; number positive: 0 Pathologic Staging: pT3, pN0, pM1 Ancillary studies: MMR pending. (JDP:gt, 04/10/15) 2. Mismatch Repair (MMR) Protein Immunohistochemistry (IHC) IHC Expression  Result: MLH1: Preserved nuclear expression (greater 50% tumor expression) MSH2: Preserved nuclear expression (greater 50% tumor expression) MSH6: Preserved nuclear expression (greater 50% tumor expression) PMS2: Preserved nuclear expression (greater 50% tumor expression) * Internal control demonstrates intact nuclear expression Interpretation: NORMAL   FoundationOne test result   RADIOGRAPHIC STUDIES: I have personally reviewed the radiological images as listed and agreed with the findings in the report.  Ct chest, abdomen and pelvis W Contrast 01/14/2016 IMPRESSION: 1. Liver metastases are stable to mildly decreased in size. No new or progressive metastatic disease. 2. Nodular curvilinear soft tissue density in the midline ventral upper abdominal wall, stable back to 07/14/2015, presumably postsurgical scarring, although cannot exclude stable superficial tumor seeding a surgical tract. 3. No evidence of local tumor recurrence at the distal colonic anastomosis. 4. Additional findings include 1 vessel coronary atherosclerosis, stable dilated main pulmonary artery suggesting chronic pulmonary arterial hypertension, trace bilateral pleural effusions, cholelithiasis, tiny hiatal hernia and bulky myomatous uterus.   ASSESSMENT & PLAN:  55 year old African-American female, with past medical history of hypertension, thalassemia trait, iron deficient anemia, uterine fibroids, endometriosis, who presents with intermittent rectal bleeding, mild nausea, and mild intermittent left lower quadrant abdominal pain.   1. Sigmoid rectal adenocarcinoma, with liver metastases, pT3N0M1, stage IV, MMR normal, NRAS and BRAF mutation (+)  -I reviewed her surgical pathology findings with her in great details. Her liver biopsy confirmed metastatic adenocarcinoma  From colon cancer. Her primary sigmoid rectal tumor has been completely removed. -We again reviewed her CT findings, it showed at least 4 liver  metastatic lesion, with the largest one measuring 9.1 cm in the left lobe. No other distant metastasis -We reviewed the natural history of metastatic colon cancer, and incurable nature at this is stage, giving her large metastatic disease in the liver. We also discussed that if she had excellent response to chemotherapy, I'll present her restaging CT scan in our GI tumor Board, to see if she is a candidate for liver surgery or liver targeted therapy to maximally controlled her disease.  -Her tumor has BRAF mutation, which is a poor prognostic factor  -Her tumor has NRAS mutation, not a candidate for EGFR inhibitor  -Given the MSI-stable, she might not benefit from immunotherapy, except in a clinically trial setting  -I reviewed her restaging CT scan from 01/14/2016, which showed slight improvement in liver mets, no other new lesions, she has had a good partial response so far. She is clinically doing very well, tolerating treatment well, will continue current therapy. - her  tumor marker CEA has been trending downlately, corresponding to good response to chemotherapy -lab reviewed, adequate for treatment, will proceed chemo today.  -repeat scan in Sep   2. Iron deficient Anemia, and beta thalassemia trait  -Secondary to GI bleeding and iron deficiency -Ferritin level was 8, consistent with iron deficiency from GI bleeding -She received Feraheme 510 mg twice,  responded very well. Her anemia resolved, even she is getting chemotherapy.  -her repeated iron study on 12/18/2015 showed ferritin 317 and normal serum iron   4 RUQ abdominal pain, low back pain -right abdominal pain resolved now, but she has intermittent pain at the biopsy site in epigastic area where there is a small subcutaneous cyst, she also has intermittent low back pain, which she contributes to her work -She has been taking oxycodone more frequently, which she contributes to her high work load   5. Hypertension -She has no history of  hypertension. Blood pressure has been slightly elevated in the past months, repeated normal. Possibly related to Avastin -I'll continue monitoring her blood pressure closely. Consider adding amlodipine if persistent high.  6. Peripheral neuropathy, G1 -Secondary to chemotherapy, which has been mild and stable  -we'll watch closely. If gets worse, we'll consider dose reduction. -I recommend her to try Neurontin, she declined due to her sensitivity to sedative medication  Plan -Lab reviewed, will proceed chemo FOLFIRI and avastin today and continue every 2 weeks, with Neulasta on day 3 -I'll see her back in 4 weeks  All questions were answered. The patient knows to call the clinic with any problems, questions or concerns. I spent 20 minutes counseling the patient face to face. The total time spent in the appointment was 25 minutes and more than 50% was on counseling.     Christina Merle, MD 02/19/2016

## 2016-02-21 ENCOUNTER — Ambulatory Visit (HOSPITAL_BASED_OUTPATIENT_CLINIC_OR_DEPARTMENT_OTHER): Payer: BLUE CROSS/BLUE SHIELD

## 2016-02-21 ENCOUNTER — Ambulatory Visit: Payer: BLUE CROSS/BLUE SHIELD

## 2016-02-21 VITALS — BP 125/78 | HR 82 | Temp 98.6°F | Resp 17 | Ht 66.0 in

## 2016-02-21 DIAGNOSIS — C787 Secondary malignant neoplasm of liver and intrahepatic bile duct: Secondary | ICD-10-CM

## 2016-02-21 DIAGNOSIS — C187 Malignant neoplasm of sigmoid colon: Secondary | ICD-10-CM

## 2016-02-21 MED ORDER — HEPARIN SOD (PORK) LOCK FLUSH 100 UNIT/ML IV SOLN
500.0000 [IU] | Freq: Once | INTRAVENOUS | Status: AC | PRN
  Administered 2016-02-21: 500 [IU]
  Filled 2016-02-21: qty 5

## 2016-02-21 MED ORDER — SODIUM CHLORIDE 0.9 % IJ SOLN
10.0000 mL | INTRAMUSCULAR | Status: DC | PRN
  Administered 2016-02-21: 10 mL
  Filled 2016-02-21: qty 10

## 2016-02-21 MED ORDER — PEGFILGRASTIM INJECTION 6 MG/0.6ML ~~LOC~~
6.0000 mg | PREFILLED_SYRINGE | Freq: Once | SUBCUTANEOUS | Status: AC
  Administered 2016-02-21: 6 mg via SUBCUTANEOUS

## 2016-02-21 NOTE — Progress Notes (Signed)
Duplicate order.

## 2016-02-21 NOTE — Progress Notes (Signed)
Pt received Neulasta after pump disconnect. Denies any new issues.

## 2016-03-04 ENCOUNTER — Other Ambulatory Visit: Payer: Self-pay | Admitting: *Deleted

## 2016-03-04 ENCOUNTER — Ambulatory Visit: Payer: BLUE CROSS/BLUE SHIELD

## 2016-03-04 ENCOUNTER — Other Ambulatory Visit (HOSPITAL_BASED_OUTPATIENT_CLINIC_OR_DEPARTMENT_OTHER): Payer: BLUE CROSS/BLUE SHIELD

## 2016-03-04 ENCOUNTER — Ambulatory Visit (HOSPITAL_BASED_OUTPATIENT_CLINIC_OR_DEPARTMENT_OTHER): Payer: BLUE CROSS/BLUE SHIELD

## 2016-03-04 VITALS — BP 124/82 | HR 86 | Temp 98.6°F | Resp 17

## 2016-03-04 DIAGNOSIS — Z5112 Encounter for antineoplastic immunotherapy: Secondary | ICD-10-CM | POA: Diagnosis not present

## 2016-03-04 DIAGNOSIS — C19 Malignant neoplasm of rectosigmoid junction: Secondary | ICD-10-CM

## 2016-03-04 DIAGNOSIS — Z5111 Encounter for antineoplastic chemotherapy: Secondary | ICD-10-CM | POA: Diagnosis not present

## 2016-03-04 DIAGNOSIS — C189 Malignant neoplasm of colon, unspecified: Secondary | ICD-10-CM

## 2016-03-04 DIAGNOSIS — C787 Secondary malignant neoplasm of liver and intrahepatic bile duct: Principal | ICD-10-CM

## 2016-03-04 LAB — COMPREHENSIVE METABOLIC PANEL
ALBUMIN: 3.9 g/dL (ref 3.5–5.0)
ALK PHOS: 136 U/L (ref 40–150)
ALT: 14 U/L (ref 0–55)
AST: 19 U/L (ref 5–34)
Anion Gap: 12 mEq/L — ABNORMAL HIGH (ref 3–11)
BUN: 10 mg/dL (ref 7.0–26.0)
CO2: 25 mEq/L (ref 22–29)
CREATININE: 1 mg/dL (ref 0.6–1.1)
Calcium: 9.7 mg/dL (ref 8.4–10.4)
Chloride: 108 mEq/L (ref 98–109)
EGFR: 76 mL/min/{1.73_m2} — ABNORMAL LOW (ref >90)
GLUCOSE: 117 mg/dL (ref 70–140)
POTASSIUM: 3.4 meq/L — AB (ref 3.5–5.1)
SODIUM: 145 meq/L (ref 136–145)
Total Bilirubin: 0.43 mg/dL (ref 0.20–1.20)
Total Protein: 7.7 g/dL (ref 6.4–8.3)

## 2016-03-04 LAB — CBC WITH DIFFERENTIAL/PLATELET
BASO%: 0.1 % (ref 0.0–2.0)
Basophils Absolute: 0 10*3/uL (ref 0.0–0.1)
EOS%: 1.7 % (ref 0.0–7.0)
Eosinophils Absolute: 0.1 10*3/uL (ref 0.0–0.5)
HEMATOCRIT: 38.9 % (ref 34.8–46.6)
HEMOGLOBIN: 11.7 g/dL (ref 11.6–15.9)
LYMPH#: 1.7 10*3/uL (ref 0.9–3.3)
LYMPH%: 21.8 % (ref 14.0–49.7)
MCH: 23.2 pg — ABNORMAL LOW (ref 25.1–34.0)
MCHC: 30.1 g/dL — AB (ref 31.5–36.0)
MCV: 77.2 fL — ABNORMAL LOW (ref 79.5–101.0)
MONO#: 0.7 10*3/uL (ref 0.1–0.9)
MONO%: 8.7 % (ref 0.0–14.0)
NEUT%: 67.7 % (ref 38.4–76.8)
NEUTROS ABS: 5.1 10*3/uL (ref 1.5–6.5)
Platelets: 301 10*3/uL (ref 145–400)
RBC: 5.04 10*6/uL (ref 3.70–5.45)
RDW: 19 % — AB (ref 11.2–14.5)
WBC: 7.6 10*3/uL (ref 3.9–10.3)

## 2016-03-04 LAB — UA PROTEIN, DIPSTICK - CHCC: Protein, ur: <30 mg/dL

## 2016-03-04 MED ORDER — IRINOTECAN HCL CHEMO INJECTION 100 MG/5ML
180.0000 mg/m2 | Freq: Once | INTRAVENOUS | Status: AC
  Administered 2016-03-04: 320 mg via INTRAVENOUS
  Filled 2016-03-04: qty 10

## 2016-03-04 MED ORDER — PALONOSETRON HCL INJECTION 0.25 MG/5ML
0.2500 mg | Freq: Once | INTRAVENOUS | Status: AC
  Administered 2016-03-04: 0.25 mg via INTRAVENOUS

## 2016-03-04 MED ORDER — PALONOSETRON HCL INJECTION 0.25 MG/5ML
INTRAVENOUS | Status: AC
  Filled 2016-03-04: qty 5

## 2016-03-04 MED ORDER — LEUCOVORIN CALCIUM INJECTION 350 MG
400.0000 mg/m2 | Freq: Once | INTRAVENOUS | Status: AC
  Administered 2016-03-04: 732 mg via INTRAVENOUS
  Filled 2016-03-04: qty 36.6

## 2016-03-04 MED ORDER — SODIUM CHLORIDE 0.9 % IV SOLN
2400.0000 mg/m2 | INTRAVENOUS | Status: DC
  Administered 2016-03-04: 4400 mg via INTRAVENOUS
  Filled 2016-03-04: qty 88

## 2016-03-04 MED ORDER — ATROPINE SULFATE 1 MG/ML IJ SOLN
INTRAMUSCULAR | Status: AC
  Filled 2016-03-04: qty 1

## 2016-03-04 MED ORDER — SODIUM CHLORIDE 0.9 % IV SOLN
Freq: Once | INTRAVENOUS | Status: AC
  Administered 2016-03-04: 12:00:00 via INTRAVENOUS

## 2016-03-04 MED ORDER — SODIUM CHLORIDE 0.9 % IV SOLN
Freq: Once | INTRAVENOUS | Status: AC
  Administered 2016-03-04: 12:00:00 via INTRAVENOUS
  Filled 2016-03-04: qty 5

## 2016-03-04 MED ORDER — OXYCODONE HCL 5 MG PO TABS: 5.0000 mg | ORAL_TABLET | Freq: Four times a day (QID) | ORAL | 0 refills | Status: DC | PRN

## 2016-03-04 MED ORDER — ATROPINE SULFATE 1 MG/ML IJ SOLN
0.5000 mg | Freq: Once | INTRAMUSCULAR | Status: AC | PRN
  Administered 2016-03-04: 0.5 mg via INTRAVENOUS

## 2016-03-04 MED ORDER — FLUOROURACIL CHEMO INJECTION 2.5 GM/50ML
400.0000 mg/m2 | Freq: Once | INTRAVENOUS | Status: AC
  Administered 2016-03-04: 750 mg via INTRAVENOUS
  Filled 2016-03-04: qty 15

## 2016-03-04 MED ORDER — SODIUM CHLORIDE 0.9 % IV SOLN
5.5000 mg/kg | Freq: Once | INTRAVENOUS | Status: AC
  Administered 2016-03-04: 400 mg via INTRAVENOUS
  Filled 2016-03-04: qty 16

## 2016-03-04 NOTE — Patient Instructions (Signed)
[facility] Discharge Instructions for Patients Receiving Chemotherapy  Today you received the following chemotherapy agents:  Leucovorin, 5FU, Camptosar  To help prevent nausea and vomiting after your treatment, we encourage you to take your nausea medication. If you develop nausea and vomiting that is not controlled by your nausea medication, call the clinic.   BELOW ARE SYMPTOMS THAT SHOULD BE REPORTED IMMEDIATELY:  *FEVER GREATER THAN 100.5 F  *CHILLS WITH OR WITHOUT FEVER  NAUSEA AND VOMITING THAT IS NOT CONTROLLED WITH YOUR NAUSEA MEDICATION  *UNUSUAL SHORTNESS OF BREATH  *UNUSUAL BRUISING OR BLEEDING  TENDERNESS IN MOUTH AND THROAT WITH OR WITHOUT PRESENCE OF ULCERS  *URINARY PROBLEMS  *BOWEL PROBLEMS  UNUSUAL RASH Items with * indicate a potential emergency and should be followed up as soon as possible.  Feel free to call the clinic you have any questions or concerns. The clinic phone number is [phone].  Please show the [place] [facility] at check-in to the Emergency Department and triage nurse.

## 2016-03-04 NOTE — Progress Notes (Signed)
Pt arrived late. Pt was sent back to infusion for labs and continued chemo

## 2016-03-05 LAB — CEA: CEA1: 132.1 ng/mL — AB (ref 0.0–4.7)

## 2016-03-06 ENCOUNTER — Ambulatory Visit: Payer: BLUE CROSS/BLUE SHIELD

## 2016-03-06 ENCOUNTER — Ambulatory Visit (HOSPITAL_BASED_OUTPATIENT_CLINIC_OR_DEPARTMENT_OTHER): Payer: BLUE CROSS/BLUE SHIELD

## 2016-03-06 VITALS — BP 136/91 | HR 79 | Temp 97.8°F | Resp 16

## 2016-03-06 DIAGNOSIS — C787 Secondary malignant neoplasm of liver and intrahepatic bile duct: Secondary | ICD-10-CM

## 2016-03-06 DIAGNOSIS — C189 Malignant neoplasm of colon, unspecified: Secondary | ICD-10-CM | POA: Diagnosis not present

## 2016-03-06 MED ORDER — SODIUM CHLORIDE 0.9 % IJ SOLN
10.0000 mL | INTRAMUSCULAR | Status: DC | PRN
  Administered 2016-03-06: 10 mL
  Filled 2016-03-06: qty 10

## 2016-03-06 MED ORDER — PEGFILGRASTIM INJECTION 6 MG/0.6ML ~~LOC~~
6.0000 mg | PREFILLED_SYRINGE | Freq: Once | SUBCUTANEOUS | Status: AC
  Administered 2016-03-06: 6 mg via SUBCUTANEOUS

## 2016-03-06 MED ORDER — HEPARIN SOD (PORK) LOCK FLUSH 100 UNIT/ML IV SOLN
500.0000 [IU] | Freq: Once | INTRAVENOUS | Status: AC | PRN
  Administered 2016-03-06: 500 [IU]
  Filled 2016-03-06: qty 5

## 2016-03-06 NOTE — Progress Notes (Deleted)
Injection given under pump disconnect encounter.

## 2016-03-17 ENCOUNTER — Emergency Department (HOSPITAL_COMMUNITY): Payer: BLUE CROSS/BLUE SHIELD

## 2016-03-17 ENCOUNTER — Encounter: Payer: Self-pay | Admitting: *Deleted

## 2016-03-17 ENCOUNTER — Encounter (HOSPITAL_COMMUNITY): Payer: Self-pay | Admitting: *Deleted

## 2016-03-17 ENCOUNTER — Emergency Department (HOSPITAL_COMMUNITY)
Admission: EM | Admit: 2016-03-17 | Discharge: 2016-03-18 | Disposition: A | Discharge status: Home / Self Care | Payer: BLUE CROSS/BLUE SHIELD | Attending: Emergency Medicine | Admitting: Emergency Medicine

## 2016-03-17 ENCOUNTER — Other Ambulatory Visit: Payer: Self-pay | Admitting: *Deleted

## 2016-03-17 DIAGNOSIS — M545 Low back pain, unspecified: Secondary | ICD-10-CM

## 2016-03-17 DIAGNOSIS — I10 Essential (primary) hypertension: Secondary | ICD-10-CM | POA: Insufficient documentation

## 2016-03-17 DIAGNOSIS — Z8505 Personal history of malignant neoplasm of liver: Secondary | ICD-10-CM | POA: Diagnosis not present

## 2016-03-17 DIAGNOSIS — Z791 Long term (current) use of non-steroidal anti-inflammatories (NSAID): Secondary | ICD-10-CM | POA: Insufficient documentation

## 2016-03-17 DIAGNOSIS — D259 Leiomyoma of uterus, unspecified: Secondary | ICD-10-CM | POA: Diagnosis not present

## 2016-03-17 LAB — CBC WITH DIFFERENTIAL/PLATELET
Basophils Absolute: 0 10*3/uL (ref 0.0–0.1)
Basophils Relative: 0 %
Eosinophils Absolute: 0.2 10*3/uL (ref 0.0–0.7)
Eosinophils Relative: 2 %
HEMATOCRIT: 39.9 % (ref 36.0–46.0)
HEMOGLOBIN: 12.2 g/dL (ref 12.0–15.0)
LYMPHS ABS: 1.7 10*3/uL (ref 0.7–4.0)
LYMPHS PCT: 17 %
MCH: 23.6 pg — AB (ref 26.0–34.0)
MCHC: 30.6 g/dL (ref 30.0–36.0)
MCV: 77 fL — AB (ref 78.0–100.0)
MONOS PCT: 7 %
Monocytes Absolute: 0.7 10*3/uL (ref 0.1–1.0)
NEUTROS PCT: 74 %
Neutro Abs: 7.6 10*3/uL (ref 1.7–7.7)
Platelets: 326 10*3/uL (ref 150–400)
RBC: 5.18 MIL/uL — AB (ref 3.87–5.11)
RDW: 19.1 % — ABNORMAL HIGH (ref 11.5–15.5)
WBC: 10.3 10*3/uL (ref 4.0–10.5)

## 2016-03-17 LAB — POC URINE PREG, ED: PREG TEST UR: NEGATIVE

## 2016-03-17 MED ORDER — KETOROLAC TROMETHAMINE 60 MG/2ML IM SOLN
60.0000 mg | Freq: Once | INTRAMUSCULAR | Status: AC
  Administered 2016-03-17: 60 mg via INTRAMUSCULAR
  Filled 2016-03-17: qty 2

## 2016-03-17 MED ORDER — CYCLOBENZAPRINE HCL 10 MG PO TABS
10.0000 mg | ORAL_TABLET | Freq: Once | ORAL | Status: AC
Start: 2016-03-17 — End: 2016-03-17
  Administered 2016-03-17: 10 mg via ORAL
  Filled 2016-03-17: qty 1

## 2016-03-17 MED ORDER — KETOROLAC TROMETHAMINE 30 MG/ML IJ SOLN
30.0000 mg | Freq: Once | INTRAMUSCULAR | Status: DC
  Filled 2016-03-17: qty 1

## 2016-03-17 NOTE — ED Notes (Signed)
Pt transported to CT ?

## 2016-03-17 NOTE — ED Triage Notes (Signed)
Pt complains of left lower back pain since Sunday. Pt denies urinary symptoms. Pt has hx of colon cancer with mets to liver. Pt last had chemo 2 weeks ago, is scheduled to have chemo again tomorrow. Pt denies injury to back. Pt states her regular pain medication is not providing relief.

## 2016-03-18 ENCOUNTER — Ambulatory Visit (HOSPITAL_BASED_OUTPATIENT_CLINIC_OR_DEPARTMENT_OTHER): Payer: BLUE CROSS/BLUE SHIELD

## 2016-03-18 ENCOUNTER — Ambulatory Visit (HOSPITAL_BASED_OUTPATIENT_CLINIC_OR_DEPARTMENT_OTHER): Payer: BLUE CROSS/BLUE SHIELD | Admitting: Hematology

## 2016-03-18 ENCOUNTER — Other Ambulatory Visit (HOSPITAL_BASED_OUTPATIENT_CLINIC_OR_DEPARTMENT_OTHER): Payer: BLUE CROSS/BLUE SHIELD

## 2016-03-18 ENCOUNTER — Encounter: Payer: Self-pay | Admitting: *Deleted

## 2016-03-18 ENCOUNTER — Other Ambulatory Visit: Payer: Self-pay | Admitting: Hematology

## 2016-03-18 VITALS — BP 131/73 | HR 87 | Temp 98.6°F | Resp 16

## 2016-03-18 DIAGNOSIS — Z5111 Encounter for antineoplastic chemotherapy: Secondary | ICD-10-CM | POA: Diagnosis not present

## 2016-03-18 DIAGNOSIS — Z95828 Presence of other vascular implants and grafts: Secondary | ICD-10-CM

## 2016-03-18 DIAGNOSIS — Z5112 Encounter for antineoplastic immunotherapy: Secondary | ICD-10-CM | POA: Diagnosis not present

## 2016-03-18 DIAGNOSIS — Z452 Encounter for adjustment and management of vascular access device: Secondary | ICD-10-CM | POA: Diagnosis not present

## 2016-03-18 DIAGNOSIS — C787 Secondary malignant neoplasm of liver and intrahepatic bile duct: Secondary | ICD-10-CM

## 2016-03-18 DIAGNOSIS — G620 Drug-induced polyneuropathy: Secondary | ICD-10-CM

## 2016-03-18 DIAGNOSIS — C187 Malignant neoplasm of sigmoid colon: Secondary | ICD-10-CM

## 2016-03-18 DIAGNOSIS — D509 Iron deficiency anemia, unspecified: Secondary | ICD-10-CM | POA: Diagnosis not present

## 2016-03-18 DIAGNOSIS — I10 Essential (primary) hypertension: Secondary | ICD-10-CM | POA: Diagnosis not present

## 2016-03-18 DIAGNOSIS — C189 Malignant neoplasm of colon, unspecified: Secondary | ICD-10-CM

## 2016-03-18 DIAGNOSIS — D500 Iron deficiency anemia secondary to blood loss (chronic): Secondary | ICD-10-CM

## 2016-03-18 LAB — CBC WITH DIFFERENTIAL/PLATELET
BASO%: 0.1 % (ref 0.0–2.0)
Basophils Absolute: 0 10*3/uL (ref 0.0–0.1)
EOS%: 2 % (ref 0.0–7.0)
Eosinophils Absolute: 0.2 10*3/uL (ref 0.0–0.5)
HEMATOCRIT: 38.8 % (ref 34.8–46.6)
HEMOGLOBIN: 11.7 g/dL (ref 11.6–15.9)
LYMPH#: 1.7 10*3/uL (ref 0.9–3.3)
LYMPH%: 21.4 % (ref 14.0–49.7)
MCH: 23.4 pg — ABNORMAL LOW (ref 25.1–34.0)
MCHC: 30.2 g/dL — AB (ref 31.5–36.0)
MCV: 77.8 fL — ABNORMAL LOW (ref 79.5–101.0)
MONO#: 0.7 10*3/uL (ref 0.1–0.9)
MONO%: 8.6 % (ref 0.0–14.0)
NEUT#: 5.3 10*3/uL (ref 1.5–6.5)
NEUT%: 67.9 % (ref 38.4–76.8)
Platelets: 293 10*3/uL (ref 145–400)
RBC: 4.99 10*6/uL (ref 3.70–5.45)
RDW: 19.4 % — AB (ref 11.2–14.5)
WBC: 7.8 10*3/uL (ref 3.9–10.3)

## 2016-03-18 LAB — CEA (IN HOUSE-CHCC): CEA (CHCC-In House): 148 ng/mL — ABNORMAL HIGH (ref 0.00–5.00)

## 2016-03-18 LAB — COMPREHENSIVE METABOLIC PANEL
ALBUMIN: 3.9 g/dL (ref 3.5–5.0)
ALK PHOS: 150 U/L (ref 40–150)
ALT: 14 U/L (ref 0–55)
ANION GAP: 12 meq/L — AB (ref 3–11)
AST: 21 U/L (ref 5–34)
BILIRUBIN TOTAL: 0.39 mg/dL (ref 0.20–1.20)
BUN: 14.1 mg/dL (ref 7.0–26.0)
CALCIUM: 9.9 mg/dL (ref 8.4–10.4)
CHLORIDE: 108 meq/L (ref 98–109)
CO2: 24 mEq/L (ref 22–29)
CREATININE: 1.1 mg/dL (ref 0.6–1.1)
EGFR: 62 mL/min/{1.73_m2} — ABNORMAL LOW (ref >90)
Glucose: 115 mg/dl (ref 70–140)
Potassium: 3.6 mEq/L (ref 3.5–5.1)
Sodium: 144 mEq/L (ref 136–145)
Total Protein: 7.8 g/dL (ref 6.4–8.3)

## 2016-03-18 LAB — IRON AND TIBC
%SAT: 12 % — AB (ref 21–57)
IRON: 45 ug/dL (ref 41–142)
TIBC: 369 ug/dL (ref 236–444)
UIBC: 324 ug/dL (ref 120–384)

## 2016-03-18 LAB — URINALYSIS, ROUTINE W REFLEX MICROSCOPIC
Bilirubin Urine: NEGATIVE
GLUCOSE, UA: NEGATIVE mg/dL
Hgb urine dipstick: NEGATIVE
Ketones, ur: NEGATIVE mg/dL
Nitrite: NEGATIVE
PH: 5 (ref 5.0–8.0)
Protein, ur: NEGATIVE mg/dL
SPECIFIC GRAVITY, URINE: 1.006 (ref 1.005–1.030)

## 2016-03-18 LAB — BASIC METABOLIC PANEL
ANION GAP: 9 (ref 5–15)
BUN: 10 mg/dL (ref 6–20)
CHLORIDE: 109 mmol/L (ref 101–111)
CO2: 22 mmol/L (ref 22–32)
Calcium: 9.4 mg/dL (ref 8.9–10.3)
Creatinine, Ser: 0.86 mg/dL (ref 0.44–1.00)
GFR calc Af Amer: >60 mL/min (ref >60)
GFR calc non Af Amer: >60 mL/min (ref >60)
GLUCOSE: 107 mg/dL — AB (ref 65–99)
POTASSIUM: 4 mmol/L (ref 3.5–5.1)
Sodium: 140 mmol/L (ref 135–145)

## 2016-03-18 LAB — URINE MICROSCOPIC-ADD ON: RBC / HPF: NONE SEEN RBC/hpf (ref 0–5)

## 2016-03-18 LAB — UA PROTEIN, DIPSTICK - CHCC: PROTEIN: 30 mg/dL

## 2016-03-18 LAB — FERRITIN: FERRITIN: 223 ng/mL (ref 9–269)

## 2016-03-18 MED ORDER — SODIUM CHLORIDE 0.9 % IV SOLN
5.5000 mg/kg | Freq: Once | INTRAVENOUS | Status: AC
  Administered 2016-03-18: 400 mg via INTRAVENOUS
  Filled 2016-03-18: qty 16

## 2016-03-18 MED ORDER — IRINOTECAN HCL CHEMO INJECTION 100 MG/5ML
180.0000 mg/m2 | Freq: Once | INTRAVENOUS | Status: AC
  Administered 2016-03-18: 320 mg via INTRAVENOUS
  Filled 2016-03-18: qty 10

## 2016-03-18 MED ORDER — PALONOSETRON HCL INJECTION 0.25 MG/5ML
0.2500 mg | Freq: Once | INTRAVENOUS | Status: AC
  Administered 2016-03-18: 0.25 mg via INTRAVENOUS

## 2016-03-18 MED ORDER — PALONOSETRON HCL INJECTION 0.25 MG/5ML
INTRAVENOUS | Status: AC
  Filled 2016-03-18: qty 5

## 2016-03-18 MED ORDER — ATROPINE SULFATE 1 MG/ML IJ SOLN
INTRAMUSCULAR | Status: AC
  Filled 2016-03-18: qty 1

## 2016-03-18 MED ORDER — SODIUM CHLORIDE 0.9 % IJ SOLN
10.0000 mL | INTRAMUSCULAR | Status: DC | PRN
  Administered 2016-03-18: 10 mL via INTRAVENOUS
  Filled 2016-03-18: qty 10

## 2016-03-18 MED ORDER — OXYCODONE HCL 5 MG PO TABS: 5.0000 mg | ORAL_TABLET | Freq: Four times a day (QID) | ORAL | 0 refills | Status: DC | PRN

## 2016-03-18 MED ORDER — LEUCOVORIN CALCIUM INJECTION 350 MG
400.0000 mg/m2 | Freq: Once | INTRAMUSCULAR | Status: AC
  Administered 2016-03-18: 732 mg via INTRAVENOUS
  Filled 2016-03-18: qty 36.6

## 2016-03-18 MED ORDER — CYCLOBENZAPRINE HCL 10 MG PO TABS: 10.0000 mg | ORAL_TABLET | Freq: Two times a day (BID) | ORAL | 0 refills | Status: DC | PRN

## 2016-03-18 MED ORDER — ATROPINE SULFATE 1 MG/ML IJ SOLN
0.5000 mg | Freq: Once | INTRAMUSCULAR | Status: AC | PRN
  Administered 2016-03-18: 0.5 mg via INTRAVENOUS

## 2016-03-18 MED ORDER — SODIUM CHLORIDE 0.9 % IV SOLN
Freq: Once | INTRAVENOUS | Status: AC
  Administered 2016-03-18: 11:00:00 via INTRAVENOUS
  Filled 2016-03-18: qty 5

## 2016-03-18 MED ORDER — SODIUM CHLORIDE 0.9 % IV SOLN
Freq: Once | INTRAVENOUS | Status: AC
  Administered 2016-03-18: 10:00:00 via INTRAVENOUS

## 2016-03-18 MED ORDER — HEPARIN SOD (PORK) LOCK FLUSH 100 UNIT/ML IV SOLN
500.0000 [IU] | Freq: Once | INTRAVENOUS | Status: DC | PRN
  Filled 2016-03-18: qty 5

## 2016-03-18 MED ORDER — SODIUM CHLORIDE 0.9 % IJ SOLN
10.0000 mL | INTRAMUSCULAR | Status: DC | PRN
  Filled 2016-03-18: qty 10

## 2016-03-18 MED ORDER — SODIUM CHLORIDE 0.9 % IV SOLN
2400.0000 mg/m2 | INTRAVENOUS | Status: DC
  Administered 2016-03-18: 4400 mg via INTRAVENOUS
  Filled 2016-03-18: qty 88

## 2016-03-18 MED ORDER — FLUOROURACIL CHEMO INJECTION 2.5 GM/50ML
400.0000 mg/m2 | Freq: Once | INTRAVENOUS | Status: AC
  Administered 2016-03-18: 750 mg via INTRAVENOUS
  Filled 2016-03-18: qty 15

## 2016-03-18 NOTE — ED Provider Notes (Signed)
[facility] DEPT Provider Note   CSN: [id number] Arrival date & time: 03/17/16  2130  First Provider Contact:  First MD Initiated Contact with Patient 03/17/16 2252        History   Chief Complaint Chief Complaint  Patient presents with  . Back Pain  . chemo pt.    HPI Christina [name] is a 55 y.o. female with a history of colon cancer with metastasis to the liver presenting with left-sided back pain. Started 3 days ago with no acute trauma. Pain is present which constant but also comes in waves that feel like a cramping sensation. No radiation. Pain is about a 4 at rest but if she stands up or walks it gets much worse. No leg pain, numbness, or weakness. No bowel or bladder incontinence. Pain is lateral to her midline. No hematuria or dysuria. No abdominal pain. Patient takes oxycodone about once a day for pain, however she has had increase this to twice per day and has been taking diclofenac twice a day. Due to the increase in pain meds she came in for evaluation. No chest pain, shortness of breath, or pleuritic pain.  HPI  Past Medical History:  Diagnosis Date  . Endometriosis   . Hypertension   . met colon ca to liver dx'd 03/2015  . Thalassanemia     Patient Active Problem List   Diagnosis Date Noted  . Port catheter in place 12/04/2015  . Chemotherapy induced neutropenia ([place]) 08/07/2015  . Rectal pain 07/02/2015  . Metastatic colon cancer to liver ([place]) 04/08/2015  . Hypertension   . Thalassanemia   . Nausea & vomiting 04/06/2015  .  Pelvic mass - 9cm - ?fibroid vs drop metastasis 04/06/2015  . Hypokalemia 04/06/2015  . Iron deficiency anemia due to chronic blood loss 04/06/2015    Past Surgical History:  Procedure Laterality Date  . COLON RESECTION N/A 04/08/2015   Procedure: LAPAROSCOPIC  RESECTION OF PART OF  SIGMOID COLON;  Surgeon: Michael Boston, MD;  Location: [facility];  Service: General;  Laterality: N/A;  . DIAGNOSTIC LAPAROSCOPY     Endometriosis  .  LAPAROSCOPIC SIGMOID COLECTOMY  04/08/2015   for colorectal cancer  . LIVER BIOPSY N/A 04/08/2015   Procedure: CORE NEEDLE LIVER BIOPSY;  Surgeon: Michael Boston, MD;  Location: [facility];  Service: General;  Laterality: N/A;  . MYOMECTOMY     Gyn in [place]  . PORTACATH PLACEMENT N/A 04/08/2015   Procedure: INSERTION PORT-A-CATH;  Surgeon: Michael Boston, MD;  Location: [facility];  Service: General;  Laterality: N/A;    OB History    No data available       Home Medications    Prior to Admission medications   Medication Sig Start Date End Date Taking? Authorizing Provider  dexamethasone (DECADRON) 4 MG tablet Take 1 tablet (4 mg total) by mouth 2 (two) times daily with a meal. 07/17/15  Yes Truitt Merle, MD  diclofenac (VOLTAREN) 50 MG EC tablet Take 50 mg by mouth daily.   Yes Historical Provider, MD  ibuprofen (ADVIL,MOTRIN) 200 MG tablet Take 200 mg by mouth every 6 (six) hours as needed for moderate pain.   Yes Historical Provider, MD  lidocaine-prilocaine (EMLA) cream Apply 1 application topically as needed. 11/20/15  Yes Truitt Merle, MD  NIFEdipine (PROCARDIA-XL/ADALAT CC) 30 MG 24 hr tablet Take 30 mg by mouth daily.   Yes Historical Provider, MD  ondansetron (ZOFRAN) 4 MG tablet Take 1 tablet (4 mg total) by mouth every 6 (  six) hours as needed for nausea. 02/05/16  Yes Truitt Merle, MD  oxyCODONE (OXY IR/ROXICODONE) 5 MG immediate release tablet Take 1 tablet (5 mg total) by mouth every 6 (six) hours as needed for moderate pain. 03/04/16  Yes Truitt Merle, MD  potassium chloride SA (K-DUR,KLOR-CON) 20 MEQ tablet Take 1 tablet (20 mEq total) by mouth 2 (two) times daily. 02/05/16  Yes Truitt Merle, MD  prochlorperazine (COMPAZINE) 10 MG tablet Take 1 tablet (10 mg total) by mouth every 6 (six) hours as needed for nausea or vomiting. 07/02/15  Yes Laurie Panda, NP  cyclobenzaprine (FLEXERIL) 10 MG tablet Take 1 tablet (10 mg total) by mouth 2 (two) times daily as needed for muscle spasms. 03/18/16   Sherwood Gambler, MD  LORazepam (ATIVAN) 0.5 MG tablet Take 1 tablet (0.5 mg total) by mouth every 8 (eight) hours. Take 0.5 mg po every 8 hours as needed for nausea/vomiting. Patient not taking: Reported on 03/17/2016 10/23/15   Truitt Merle, MD    Family History Family History  Problem Relation Age of Onset  . Anesthesia problems Cousin 56    maternal cousin, colon cancer   . Clotting disorder Maternal Grandmother     Social History Social History  Substance Use Topics  . Smoking status: Never Smoker  . Smokeless tobacco: Never Used  . Alcohol use Yes     Comment: socail drinker      Allergies   Lactose intolerance (gi); Milk-related compounds; and Nsaids   Review of Systems Review of Systems  Constitutional: Negative for fever.  Respiratory: Negative for shortness of breath.   Cardiovascular: Negative for chest pain.  Gastrointestinal: Negative for abdominal pain, nausea and vomiting.  Genitourinary: Negative for dysuria and hematuria.  Musculoskeletal: Positive for back pain.  Neurological: Negative for weakness and numbness.  All other systems reviewed and are negative.    Physical Exam Updated Vital Signs BP (!) 171/110   Pulse 101   Temp 98.1 F (36.7 C)   Resp 18   SpO2 99%   Physical Exam  Constitutional: She is oriented to person, place, and time. She appears well-developed and well-nourished. No distress.  HENT:  Head: Normocephalic and atraumatic.  Right Ear: External ear normal.  Left Ear: External ear normal.  Nose: Nose normal.  Eyes: Right eye exhibits no discharge. Left eye exhibits no discharge.  Cardiovascular: Normal rate, regular rhythm and normal heart sounds.   Pulmonary/Chest: Effort normal and breath sounds normal.  Abdominal: Soft. She exhibits no distension. There is no tenderness.  Musculoskeletal:       Lumbar back: She exhibits tenderness. She exhibits no bony tenderness.       Back:  Neurological: She is alert and oriented to person,  place, and time.  Reflex Scores:      Patellar reflexes are 2+ on the right side and 2+ on the left side.      Achilles reflexes are 2+ on the right side and 2+ on the left side. 5/5 strength in BLE. Normal gross sensation  Skin: Skin is warm and dry. She is not diaphoretic.  Nursing note and vitals reviewed.    ED Treatments / Results  Labs (all labs ordered are listed, but only abnormal results are displayed) Labs Reviewed  BASIC METABOLIC PANEL - Abnormal; Notable for the following:       Result Value   Glucose, Bld 107 (*)    All other components within normal limits  CBC WITH DIFFERENTIAL/PLATELET - Abnormal;  Notable for the following:    RBC 5.18 (*)    MCV 77.0 (*)    MCH 23.6 (*)    RDW 19.1 (*)    All other components within normal limits  URINALYSIS, ROUTINE W REFLEX MICROSCOPIC (NOT AT [facility]) - Abnormal; Notable for the following:    Leukocytes, UA TRACE (*)    All other components within normal limits  URINE MICROSCOPIC-ADD ON - Abnormal; Notable for the following:    Squamous Epithelial / LPF 0-5 (*)    Bacteria, UA RARE (*)    All other components within normal limits  POC URINE PREG, ED    EKG  EKG Interpretation None       Radiology Ct Renal Stone Study  Result Date: 03/17/2016 CLINICAL DATA:  55 year old female with acute left abdominal and flank pain. Patient with known metastatic colon cancer to the liver. EXAM: CT ABDOMEN AND PELVIS WITHOUT CONTRAST TECHNIQUE: Multidetector CT imaging of the abdomen and pelvis was performed following the standard protocol without IV contrast. COMPARISON:  01/14/2016 and prior CTs FINDINGS: Please note that parenchymal abnormalities may be missed without intravenous contrast. Lower chest: Mild bibasilar atelectasis/ scarring again noted. Trace bilateral pleural effusions are minimally increased. Hepatobiliary: A 4.7 x 6.7 cm left hepatic metastasis (image 12) and a 6 mm central right hepatic metastasis (image 18) are  unchanged. Other tiny hypodensities within the liver are unchanged. Cholelithiasis noted without CT evidence of acute cholecystitis. Pancreas: Unremarkable Spleen: Unremarkable Adrenals/Urinary Tract: The kidneys, adrenal glands and bladder are unremarkable. There is no evidence of hydronephrosis or urinary calculi. Stomach/Bowel: Colonic surgical changes identified. There is no evidence of bowel obstruction or focal bowel wall thickening. The appendix is normal. Vascular/Lymphatic: Abdominal aortic atherosclerotic calcifications noted without aneurysm. No enlarged lymph nodes identified. Reproductive: Large uterine fibroids are again identified. Other: No free fluid, abscess or pneumoperitoneum. Musculoskeletal: No acute or suspicious abnormality. IMPRESSION: No acute abnormalities. Hepatic metastases without significant change since 01/14/2016. Trace bilateral pleural effusions, minimally increased. Cholelithiasis without CT evidence of acute cholecystitis. Abdominal aortic atherosclerosis. Uterine fibroids. Electronically Signed   By: Margarette Canada M.D.   On: 03/17/2016 23:43    Procedures Procedures (including critical care time)  Medications Ordered in ED Medications  cyclobenzaprine (FLEXERIL) tablet 10 mg (10 mg Oral Given 03/17/16 2310)  ketorolac (TORADOL) injection 60 mg (60 mg Intramuscular Given 03/17/16 2342)     Initial Impression / Assessment and Plan / ED Course  I have reviewed the triage vital signs and the nursing notes.  Pertinent labs & imaging results that were available during my care of the patient were reviewed by me and considered in my medical decision making (see chart for details).  Clinical Course  Comment By Time  Patient overall appears well. I think it's most likely muscular, probably spasm. However given cancer history will get CT stone study. She is willing to try flexeril po but doesn't want valium or narcotics. Will also try toradol. Sherwood Gambler, MD 08/09 2308    Patient feels much better. CT scan is unremarkable besides known liver metastases. Toradol and Flexeril have made her feel much better, I think she can be discharge with muscle relaxers and she has NSAIDs at home. Urine has trace leukocytes but she is not having dysuria or frequency. I do not think this is UTI or pyelonephritis. Plan for heat, muscle relaxer, exercises, and follow-up with PCP. Discussed return precautions. No midline pain or neuro symptoms. Sherwood Gambler, MD 08/10 [phone]  Final Clinical Impressions(s) / ED Diagnoses   Final diagnoses:  Left-sided low back pain without sciatica    New Prescriptions New Prescriptions   CYCLOBENZAPRINE (FLEXERIL) 10 MG TABLET    Take 1 tablet (10 mg total) by mouth 2 (two) times daily as needed for muscle spasms.     Sherwood Gambler, MD 03/18/16 [phone]

## 2016-03-18 NOTE — Patient Instructions (Signed)
folfiri avastin

## 2016-03-18 NOTE — Patient Instructions (Signed)

## 2016-03-18 NOTE — Progress Notes (Signed)
Oncology Nurse Navigator Documentation  Oncology Nurse Navigator Flowsheets 03/18/2016  Navigator Location [facility]  Navigator Encounter Type Treatment  Telephone -  Patient Visit Type MedOnc  Treatment Phase Active Tx--FOLFIRI/Avstin  Barriers/Navigation Needs No barriers at this time;No Questions;No Needs  Interventions None required  Referrals -  Coordination of Care -  Support Groups/Services -  Acuity -  Time Spent with Patient 15  Met with patient and her husband in tx area. She is still working full time at [employer]. Just completed a church trip to [place] and soon will be going to [place] for another church related trip and working in [employer]. Her and her husband are planning a 3 week trip to [place] before the end of the year--his parents are up [place] and hers are in [place]. No navigation needs at this time.

## 2016-03-19 LAB — CEA: CEA: 139.7 ng/mL — ABNORMAL HIGH (ref 0.0–4.7)

## 2016-03-20 ENCOUNTER — Ambulatory Visit (HOSPITAL_BASED_OUTPATIENT_CLINIC_OR_DEPARTMENT_OTHER): Payer: BLUE CROSS/BLUE SHIELD

## 2016-03-20 ENCOUNTER — Ambulatory Visit: Payer: BLUE CROSS/BLUE SHIELD

## 2016-03-20 VITALS — BP 158/87 | HR 83 | Temp 97.8°F | Resp 18

## 2016-03-20 DIAGNOSIS — C189 Malignant neoplasm of colon, unspecified: Secondary | ICD-10-CM

## 2016-03-20 DIAGNOSIS — C787 Secondary malignant neoplasm of liver and intrahepatic bile duct: Secondary | ICD-10-CM | POA: Diagnosis not present

## 2016-03-20 DIAGNOSIS — C187 Malignant neoplasm of sigmoid colon: Secondary | ICD-10-CM

## 2016-03-20 MED ORDER — HEPARIN SOD (PORK) LOCK FLUSH 100 UNIT/ML IV SOLN
500.0000 [IU] | Freq: Once | INTRAVENOUS | Status: AC | PRN
  Administered 2016-03-20: 500 [IU]
  Filled 2016-03-20: qty 5

## 2016-03-20 MED ORDER — SODIUM CHLORIDE 0.9 % IJ SOLN
10.0000 mL | INTRAMUSCULAR | Status: DC | PRN
  Administered 2016-03-20: 10 mL
  Filled 2016-03-20: qty 10

## 2016-03-20 MED ORDER — PEGFILGRASTIM INJECTION 6 MG/0.6ML ~~LOC~~
6.0000 mg | PREFILLED_SYRINGE | Freq: Once | SUBCUTANEOUS | Status: AC
  Administered 2016-03-20: 6 mg via SUBCUTANEOUS

## 2016-03-23 ENCOUNTER — Encounter: Payer: Self-pay | Admitting: Hematology

## 2016-03-23 NOTE — Progress Notes (Signed)
[place]  [phone]) [phone] [phone]) [phone]  Clinic follow up Note   Patient Care Team: Benito Mccreedy, MD as PCP - General (Internal Medicine) Juanita Craver, MD as Consulting Physician (Gastroenterology) Michael Boston, MD as Consulting Physician (General Surgery) Truitt Merle, MD as Consulting Physician (Oncology) 03/18/2016  CHIEF COMPLAINTS:  Follow up metastatic colon cancer      Oncology History   Metastatic colon cancer to liver   Staging form: Colon and Rectum, AJCC 7th Edition     Pathologic stage from 04/08/2015: T3, N0, M1 - Signed by Truitt Merle, MD on 04/22/2015  Presented to ER with intractable N/V; intermittent rectal bleeding X 3 months      Metastatic colon cancer to liver ([place])   04/03/2015 Procedure    Colonoscopy showed a obstructing sigmoid rectal mass. Biopsy showed adenocarcinoma.     04/06/2015 Tumor Marker    CEA=467.3 / CA19.9=1605     04/06/2015 Imaging    CT chest, abdomen and pelvis with contrast showed sigmoid colon rectal mass, multiple (4) liver metastasis, with the largest 9.1 x 6.1 cm mass in the left lobe..     04/08/2015 Miscellaneous    Foundation one genomic testing showed NRAS G60e and BRAF [id number] mutations     04/08/2015 Initial Diagnosis    Metastatic colon cancer to liver     04/08/2015 Surgery    Laparoscopic sigmoid colectomy, liver biopsy, port cath insertion, by Dr. Johney Maine      04/08/2015 Pathology Results    Sigmoid colon segmental resection showed adenocarcinoma with mucinous features, pT 3, 15 lymph nodes all negative, surgical margins were negative. Liver biopsy showed metastatic adenocarcinoma.     05/08/2015 -  Chemotherapy    FOLFIRI every 2 weeks, Avastin was added on from second cycle      07/14/2015 Imaging    CT abdomen and pelvis showed interval slight improvement in liver metastasis. No other new lesions.     10/12/2015 Imaging     CT abdomen and pelvis with contrast showed continued interval  decrease in size of multiple hepatic metastases,  no other new lesions.       HISTORY OF PRESENTING ILLNESS:  Christina [name] 55 y.o. female with past medical history of endometriosis, uterine fibroids, thalassemia trait, iron deficient anemia, who was recently found to have a sigmoid rectal cancer. I initially saw her in the hospital, she is here for the first follow-up.  She has been having intermittent rectal bleeding for the past 3 months. She thought it was related to her endometriosis, did not seek medical attention. She also has intermittent mild nausea, especially in the morning, and left lower quadrant abdominal pain. She was found to have profound anemia with hemoglobin 6.5 last week, and received blood transfusion. She was referred to gastroenterologist Dr. Collene Mares last week, underwent colonoscopy 2 days ago, which showed a obstructing sigmoid rectal mass, per patient, the colonoscopy report is not available today. Biopsy was done, but the pathology report is still pending.  She developed severe nausea, and vomited several times with clear gastric liquid. She called [name], and I was told to come to [facility] emergency room by on-call physician Dr. Carlean Purl. She had a CT scan done in the emergency room today.  Her appetite was fairly normal up to 2 days ago before recent hospital admission, when she was found to have a colorectal mass. She lost about 45 pounds in the past few weeks. 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 family history was positive for colon cancer in her maternal cousin. She is married, lives with her husband, no children.  CURRENT THERAPY: FOLFIRI and Avastin every 2 weeks, started on 05/08/2015, dose reduction and neulasta added from cycle 7  INTERIM HISTORY [name] returns for follow-up and chemo. She has has worsening right frank pain in the past few weeks, it's positional, feels like muscle spasm,  she has been taking oxycodone more frequently. No fever, chills, dysuria or hematuria. She denies any injury. Her pain got much worse yesterday, and she went to emergency room. She had a CT scan, which showed no renal stone. She was given pain medication and muscle relaxants, the pain much improved. She is able to walk as usual today and coming for chemotherapy treatment today. I saw her inpatient room. She also reports her right shoulder pain is slightly worse than before too.   MEDICAL HISTORY:  Past Medical History:  Diagnosis Date  . Endometriosis   . Hypertension   . met colon ca to liver dx'd 03/2015  . Thalassanemia     SURGICAL HISTORY: Past Surgical History:  Procedure Laterality Date  . COLON RESECTION N/A 04/08/2015   Procedure: LAPAROSCOPIC  RESECTION OF PART OF  SIGMOID COLON;  Surgeon: Michael Boston, MD;  Location: [facility];  Service: General;  Laterality: N/A;  . DIAGNOSTIC LAPAROSCOPY     Endometriosis  . LAPAROSCOPIC SIGMOID COLECTOMY  04/08/2015   for colorectal cancer  . LIVER BIOPSY N/A 04/08/2015   Procedure: CORE NEEDLE LIVER BIOPSY;  Surgeon: Michael Boston, MD;  Location: [facility];  Service: General;  Laterality: N/A;  . MYOMECTOMY     Gyn in [place]  . PORTACATH PLACEMENT N/A 04/08/2015   Procedure: INSERTION PORT-A-CATH;  Surgeon: Michael Boston, MD;  Location: [facility];  Service: General;  Laterality: N/A;    SOCIAL HISTORY: Social History   Social History  . Marital status: Married    Spouse name: N/A  . Number of children: N/A  . Years of education: N/A   Occupational History  . Internal Medicine doctor     Works in [place] Topics  . Smoking status: Never Smoker  . Smokeless tobacco: Never Used  . Alcohol use Yes     Comment: socail drinker   . Drug use: No  . Sexual activity: Not on file   Other Topics Concern  . Not on file   Social History Narrative   Married, husband [name] (married X 2 years)   No children     IM Physician in [place]: Family History  Problem Relation Age of Onset  . Anesthesia problems Cousin 12    maternal cousin, colon cancer   . Clotting disorder Maternal Grandmother     ALLERGIES:  is allergic to lactose intolerance (gi); milk-related compounds; and nsaids.  MEDICATIONS:  Current Outpatient Prescriptions  Medication Sig Dispense Refill  . cyclobenzaprine (FLEXERIL) 10 MG tablet Take 1 tablet (10 mg total) by mouth 2 (two) times daily as needed for muscle spasms. 10 tablet 0  . dexamethasone (DECADRON) 4 MG tablet Take 1 tablet (4 mg total) by mouth 2 (two) times daily with a meal. 10 tablet 1  . diclofenac (VOLTAREN) 50 MG EC tablet Take 50 mg by mouth daily.    Marland Kitchen ibuprofen (ADVIL,MOTRIN) 200 MG tablet Take 200 mg by mouth every 6 (six) hours as needed for moderate  pain.    . lidocaine-prilocaine (EMLA) cream Apply 1 application topically as needed. 30 g 6  . LORazepam (ATIVAN) 0.5 MG tablet Take 1 tablet (0.5 mg total) by mouth every 8 (eight) hours. Take 0.5 mg po every 8 hours as needed for nausea/vomiting. (Patient not taking: Reported on 03/17/2016) 30 tablet 0  . NIFEdipine (PROCARDIA-XL/ADALAT CC) 30 MG 24 hr tablet Take 30 mg by mouth daily.    . ondansetron (ZOFRAN) 4 MG tablet Take 1 tablet (4 mg total) by mouth every 6 (six) hours as needed for nausea. 30 tablet 2  . oxyCODONE (OXY IR/ROXICODONE) 5 MG immediate release tablet Take 1 tablet (5 mg total) by mouth every 6 (six) hours as needed for moderate pain. 60 tablet 0  . potassium chloride SA (K-DUR,KLOR-CON) 20 MEQ tablet Take 1 tablet (20 mEq total) by mouth 2 (two) times daily. 30 tablet 1  . prochlorperazine (COMPAZINE) 10 MG tablet Take 1 tablet (10 mg total) by mouth every 6 (six) hours as needed for nausea or vomiting. 30 tablet 1   No current facility-administered medications for this visit.     REVIEW OF SYSTEMS:   Constitutional: Denies fevers, chills or abnormal night  sweats, (+) fatigue Eyes: Denies blurriness of vision, double vision or watery eyes Ears, nose, mouth, throat, and face: Denies mucositis or sore throat Respiratory: Denies cough, dyspnea or wheezes Cardiovascular: Denies palpitation, chest discomfort or lower extremity swelling Gastrointestinal:  (+) nausea, (+) RUQ and rectal pain, and mild constipation Skin: Denies abnormal skin rashes Lymphatics: Denies new lymphadenopathy or easy bruising Neurological:Denies numbness, tingling or new weaknesses, (+) intermittent right flank pain Behavioral/Psych: Mood is stable, no new changes  All other systems were reviewed with the patient and are negative.  PHYSICAL EXAMINATION: ECOG PERFORMANCE STATUS: 1 Blood pressure 131/73, heart rate 87, respirator rate 16, temperature 98.6, pulse ox 100% on room air GENERAL:alert, no distress and comfortable SKIN: skin color, texture, turgor are normal, no rashes or significant lesions EYES: normal, conjunctiva are pink and non-injected, sclera clear OROPHARYNX:no exudate, no erythema and lips, buccal mucosa, and tongue normal  NECK: supple, thyroid normal size, non-tender, without nodularity LYMPH:  no palpable lymphadenopathy in the cervical, axillary or inguinal LUNGS: clear to auscultation and percussion with normal breathing effort HEART: regular rate & rhythm and no murmurs and no lower extremity edema ABDOMEN:abdomen soft, non-tender and normal bowel sounds.  There is a small subcutaneous nodule in the epigastric area, slightly tender. Musculoskeletal:no cyanosis of digits and no clubbing  PSYCH: alert & oriented x 3 with fluent speech NEURO: no focal motor/sensory deficitsall  LABORATORY DATA:  I have reviewed the data as listed CBC Latest Ref Rng & Units 03/18/2016 03/17/2016 03/04/2016  WBC 3.9 - 10.3 10e3/uL 7.8 10.3 7.6  Hemoglobin 11.6 - 15.9 g/dL 11.7 12.2 11.7  Hematocrit 34.8 - 46.6 % 38.8 39.9 38.9  Platelets 145 - 400 10e3/uL 293 326 301     CMP Latest Ref Rng & Units 03/18/2016 03/17/2016 03/04/2016  Glucose 70 - 140 mg/dl 115 107(H) 117  BUN 7.0 - 26.0 mg/dL 14.1 10 10.0  Creatinine 0.6 - 1.1 mg/dL 1.1 0.86 1.0  Sodium 136 - 145 mEq/L 144 140 145  Potassium 3.5 - 5.1 mEq/L 3.6 4.0 3.4(L)  Chloride 101 - 111 mmol/L - 109 -  CO2 22 - 29 mEq/L 24 22 25   Calcium 8.4 - 10.4 mg/dL 9.9 9.4 9.7  Total Protein 6.4 - 8.3 g/dL 7.8 - 7.7  Total  Bilirubin 0.20 - 1.20 mg/dL 0.39 - 0.43  Alkaline Phos 40 - 150 U/L 150 - 136  AST 5 - 34 U/L 21 - 19  ALT 0 - 55 U/L 14 - 14  ``  Diagnosis 04/08/2015 1. Liver, needle/core biopsy, ? cancer - METASTATIC ADENOCARCINOMA. 2. Colon, segmental resection for tumor, sigmoid colon mass open end proximal - COLONIC ADENOCARCINOMA WITH MUCINOUS FEATURES EXTENDING INTO PERICOLONIC ADIPOSE TISSUE AND SUBSEROSAL CONNECTIVE TISSUE. - MARGINS NOT INVOLVED. - FIFTEEN BENIGN LYMPH NODES (0/15). 3. Colon, resection margin (donut), distal anastomic ring - BENIGN COLON. - NO EVIDENCE OF MALIGNANCY.  Microscopic Comment Specimen: Sigmoid colon with liver biopsy and anastomotic rings. Procedure: Segmental resection with liver biopsy. Tumor site: Distal sigmoid. Specimen integrity: Intact. Macroscopic intactness of mesorectum: N/A Macroscopic tumor perforation: No. Invasive tumor: Maximum size: 8 cm Histologic type(s): Colorectal adenocarcinoma with mucinous features. Histologic grade and differentiation: G2: moderately differentiated/low grade Type of polyp in which invasive carcinoma arose: No residual polyp. Microscopic extension of invasive tumor: Into pericolonic adipose tissue and subserosal connective tissue. Lymph-Vascular invasion: Present. Peri-neural invasion: Present. Tumor deposit(s) (discontinuous extramural extension): No. Resection margins: Proximal margin: Free of tumor. Distal margin: Free of tumor. Circumferential (radial) (posterior ascending, posterior descending; lateral and  posterior mid-rectum; and entire lower 1/3 rectum): N/A Mesenteric margin (sigmoid and transverse): Free of tumor. Distance closest margin (if all above margins negative): 2 cm from mesenteric margin. Treatment effect (neo-adjuvant therapy): No. Additional polyp(s): No. Non-neoplastic findings: None. Lymph nodes: number examined - 15; number positive: 0 Pathologic Staging: pT3, pN0, pM1 Ancillary studies: MMR pending. (JDP:gt, 04/10/15) 2. Mismatch Repair (MMR) Protein Immunohistochemistry (IHC) IHC Expression Result: MLH1: Preserved nuclear expression (greater 50% tumor expression) MSH2: Preserved nuclear expression (greater 50% tumor expression) MSH6: Preserved nuclear expression (greater 50% tumor expression) PMS2: Preserved nuclear expression (greater 50% tumor expression) * Internal control demonstrates intact nuclear expression Interpretation: NORMAL   FoundationOne test result   RADIOGRAPHIC STUDIES: I have personally reviewed the radiological images as listed and agreed with the findings in the report.  Ct chest, abdomen and pelvis W Contrast 01/14/2016 IMPRESSION: 1. Liver metastases are stable to mildly decreased in size. No new or progressive metastatic disease. 2. Nodular curvilinear soft tissue density in the midline ventral upper abdominal wall, stable back to 07/14/2015, presumably postsurgical scarring, although cannot exclude stable superficial tumor seeding a surgical tract. 3. No evidence of local tumor recurrence at the distal colonic anastomosis. 4. Additional findings include 1 vessel coronary atherosclerosis, stable dilated main pulmonary artery suggesting chronic pulmonary arterial hypertension, trace bilateral pleural effusions, cholelithiasis, tiny hiatal hernia and bulky myomatous uterus.   ASSESSMENT & PLAN:  55 year old African-American female, with past medical history of hypertension, thalassemia trait, iron deficient anemia, uterine fibroids,  endometriosis, who presents with intermittent rectal bleeding, mild nausea, and mild intermittent left lower quadrant abdominal pain.   1. Sigmoid rectal adenocarcinoma, with liver metastases, pT3N0M1, stage IV, MMR normal, NRAS and BRAF mutation (+)  -I reviewed her surgical pathology findings with her in great details. Her liver biopsy confirmed metastatic adenocarcinoma  From colon cancer. Her primary sigmoid rectal tumor has been completely removed. -We again reviewed her CT findings, it showed at least 4 liver metastatic lesion, with the largest one measuring 9.1 cm in the left lobe. No other distant metastasis -We reviewed the natural history of metastatic colon cancer, and incurable nature at this is stage, giving her large metastatic disease in the liver. We also discussed that if she had  excellent response to chemotherapy, I'll present her restaging CT scan in our GI tumor Board, to see if she is a candidate for liver surgery or liver targeted therapy to maximally controlled her disease.  -Her tumor has BRAF mutation, which is a poor prognostic factor  -Her tumor has NRAS mutation, not a candidate for EGFR inhibitor  -Given the MSI-stable, she might not benefit from immunotherapy, except in a clinically trial setting  -I reviewed her restaging CT scan from 01/14/2016, which showed slight improvement in liver mets, no other new lesions, she has had a good partial response so far.  - her tumor marker CEA has been trending down lately, corresponding to good response to chemotherapy -I discussed her recent CT abdomen which was done in the ED yesterday, her liver metastasis stable. No other etiology to explain her severe pain. -Due to her significant right flank pain and shoulder pain, I recommend to obtain a bone scan to ruled out a bone metastasis. -We'll continue chemotherapy for now   2. Right frank and shoulder pain -Possible muscle spasm, could be related to chemotherapy. I would like to  ruled out a bone metastasis -We'll obtain a bone scan. -She'll continue use Flexeril and oxycodone as needed   3 Iron deficient Anemia, and beta thalassemia trait  -Secondary to GI bleeding and iron deficiency -Ferritin level was 8, consistent with iron deficiency from GI bleeding -She received Feraheme 510 mg twice,  responded very well. Her anemia resolved, even she is getting chemotherapy.  -her repeated iron study on 12/18/2015 showed ferritin 317 and normal serum iron   5. Hypertension -She has no history of hypertension. Blood pressure has been slightly elevated in the past months, repeated normal. Possibly related to Avastin -I'll continue monitoring her blood pressure closely. Consider adding amlodipine if persistent high.  6. Peripheral neuropathy, G1 -Secondary to chemotherapy, which has been mild and stable  -we'll watch closely. If gets worse, we'll consider dose reduction. -I recommend her to try Neurontin, she declined due to her sensitivity to sedative medication  Plan -Lab reviewed, will proceed chemo FOLFIRI and avastin today and continue every 2 weeks, with Neulasta on day 3 -bone scan in the next 1-2 weeks -I will see her back in 2 weeks   All questions were answered. The patient knows to call the clinic with any problems, questions or concerns. I spent 20 minutes counseling the patient face to face. The total time spent in the appointment was 25 minutes and more than 50% was on counseling.     Truitt Merle, MD 03/23/2016

## 2016-03-24 ENCOUNTER — Other Ambulatory Visit: Payer: Self-pay | Admitting: *Deleted

## 2016-03-24 MED ORDER — PROCHLORPERAZINE MALEATE 10 MG PO TABS: 10.0000 mg | ORAL_TABLET | Freq: Four times a day (QID) | ORAL | 1 refills | Status: DC | PRN

## 2016-03-25 ENCOUNTER — Other Ambulatory Visit: Payer: Self-pay | Admitting: *Deleted

## 2016-03-25 ENCOUNTER — Telehealth: Payer: Self-pay | Admitting: *Deleted

## 2016-03-25 NOTE — Telephone Encounter (Signed)
Oncology Nurse Navigator Documentation  Oncology Nurse Navigator Flowsheets 03/25/2016  Navigator Location [facility]  Navigator Encounter Type Telephone  Telephone Outgoing Call;Appt Confirmation/Clarification  Patient Visit Type -  Treatment Phase -  Barriers/Navigation Needs Coordination of Care--bone scan  Interventions Coordination of Care--scheduled bone scan according to her date of choice.  Referrals -  Coordination of Care Radiology--bone scan 03/31/16 at 12:00/1500 at [facility]  Time Spent with Patient 15  Provided [facility] appointment for bone scan at [facility] on 03/31/16 at 1145/1200 with return at 3 pm for scan. No dietary restrictions. Will be at [facility] entrance [place]. Register in admitting at 1145 with injection at 12:00. Instructed her to call if she has not heard from scheduling by end of week in regards to her chemo appointments.

## 2016-03-26 ENCOUNTER — Telehealth: Payer: Self-pay | Admitting: Hematology

## 2016-03-26 NOTE — Telephone Encounter (Signed)
Spoke with pt to confirm 8/24 appt date/time

## 2016-03-31 ENCOUNTER — Encounter (HOSPITAL_COMMUNITY): Payer: BLUE CROSS/BLUE SHIELD

## 2016-03-31 ENCOUNTER — Encounter (HOSPITAL_COMMUNITY)
Admission: RE | Admit: 2016-03-31 | Discharge: 2016-03-31 | Disposition: A | Discharge status: Home / Self Care | Payer: BLUE CROSS/BLUE SHIELD | Source: Ambulatory Visit | Attending: Hematology | Admitting: Hematology

## 2016-03-31 DIAGNOSIS — C787 Secondary malignant neoplasm of liver and intrahepatic bile duct: Secondary | ICD-10-CM | POA: Diagnosis present

## 2016-03-31 DIAGNOSIS — C189 Malignant neoplasm of colon, unspecified: Secondary | ICD-10-CM

## 2016-03-31 MED ORDER — TECHNETIUM TC 99M MEDRONATE IV KIT
25.0000 | PACK | Freq: Once | INTRAVENOUS | Status: AC | PRN
Start: 2016-03-31 — End: 2016-03-31
  Administered 2016-03-31: 25 via INTRAVENOUS

## 2016-04-01 ENCOUNTER — Encounter: Payer: Self-pay | Admitting: Hematology

## 2016-04-01 ENCOUNTER — Ambulatory Visit (HOSPITAL_BASED_OUTPATIENT_CLINIC_OR_DEPARTMENT_OTHER): Payer: BLUE CROSS/BLUE SHIELD | Admitting: Hematology

## 2016-04-01 ENCOUNTER — Other Ambulatory Visit (HOSPITAL_BASED_OUTPATIENT_CLINIC_OR_DEPARTMENT_OTHER): Payer: BLUE CROSS/BLUE SHIELD

## 2016-04-01 ENCOUNTER — Ambulatory Visit: Payer: BLUE CROSS/BLUE SHIELD

## 2016-04-01 ENCOUNTER — Ambulatory Visit (HOSPITAL_BASED_OUTPATIENT_CLINIC_OR_DEPARTMENT_OTHER): Payer: BLUE CROSS/BLUE SHIELD

## 2016-04-01 VITALS — HR 97

## 2016-04-01 VITALS — BP 143/94 | HR 108 | Temp 98.7°F | Resp 18 | Ht 66.0 in | Wt 178.0 lb

## 2016-04-01 DIAGNOSIS — C189 Malignant neoplasm of colon, unspecified: Secondary | ICD-10-CM

## 2016-04-01 DIAGNOSIS — Z5112 Encounter for antineoplastic immunotherapy: Secondary | ICD-10-CM

## 2016-04-01 DIAGNOSIS — C187 Malignant neoplasm of sigmoid colon: Secondary | ICD-10-CM

## 2016-04-01 DIAGNOSIS — D509 Iron deficiency anemia, unspecified: Secondary | ICD-10-CM

## 2016-04-01 DIAGNOSIS — D561 Beta thalassemia: Secondary | ICD-10-CM

## 2016-04-01 DIAGNOSIS — Z5111 Encounter for antineoplastic chemotherapy: Secondary | ICD-10-CM

## 2016-04-01 DIAGNOSIS — D500 Iron deficiency anemia secondary to blood loss (chronic): Secondary | ICD-10-CM

## 2016-04-01 DIAGNOSIS — C787 Secondary malignant neoplasm of liver and intrahepatic bile duct: Principal | ICD-10-CM

## 2016-04-01 DIAGNOSIS — I10 Essential (primary) hypertension: Secondary | ICD-10-CM

## 2016-04-01 DIAGNOSIS — G620 Drug-induced polyneuropathy: Secondary | ICD-10-CM

## 2016-04-01 LAB — CBC WITH DIFFERENTIAL/PLATELET
BASO%: 0.3 % (ref 0.0–2.0)
Basophils Absolute: 0 10*3/uL (ref 0.0–0.1)
EOS ABS: 0.2 10*3/uL (ref 0.0–0.5)
EOS%: 2.2 % (ref 0.0–7.0)
HCT: 39.5 % (ref 34.8–46.6)
HGB: 12.3 g/dL (ref 11.6–15.9)
LYMPH%: 22 % (ref 14.0–49.7)
MCH: 23.7 pg — AB (ref 25.1–34.0)
MCHC: 31.1 g/dL — ABNORMAL LOW (ref 31.5–36.0)
MCV: 76.1 fL — AB (ref 79.5–101.0)
MONO#: 0.8 10*3/uL (ref 0.1–0.9)
MONO%: 8.8 % (ref 0.0–14.0)
NEUT%: 66.7 % (ref 38.4–76.8)
NEUTROS ABS: 5.8 10*3/uL (ref 1.5–6.5)
NRBC: 0 % (ref 0–0)
Platelets: 281 10*3/uL (ref 145–400)
RBC: 5.19 10*6/uL (ref 3.70–5.45)
RDW: 19 % — AB (ref 11.2–14.5)
WBC: 8.7 10*3/uL (ref 3.9–10.3)
lymph#: 1.9 10*3/uL (ref 0.9–3.3)

## 2016-04-01 LAB — COMPREHENSIVE METABOLIC PANEL
ALBUMIN: 3.9 g/dL (ref 3.5–5.0)
ALK PHOS: 154 U/L — AB (ref 40–150)
ALT: 9 U/L (ref 0–55)
ANION GAP: 9 meq/L (ref 3–11)
AST: 16 U/L (ref 5–34)
BILIRUBIN TOTAL: 0.43 mg/dL (ref 0.20–1.20)
BUN: 10.1 mg/dL (ref 7.0–26.0)
CALCIUM: 10 mg/dL (ref 8.4–10.4)
CO2: 25 mEq/L (ref 22–29)
Chloride: 107 mEq/L (ref 98–109)
Creatinine: 1 mg/dL (ref 0.6–1.1)
EGFR: 71 mL/min/{1.73_m2} — ABNORMAL LOW (ref >90)
Glucose: 97 mg/dl (ref 70–140)
POTASSIUM: 3.7 meq/L (ref 3.5–5.1)
Sodium: 141 mEq/L (ref 136–145)
TOTAL PROTEIN: 7.8 g/dL (ref 6.4–8.3)

## 2016-04-01 MED ORDER — SODIUM CHLORIDE 0.9 % IV SOLN
Freq: Once | INTRAVENOUS | Status: AC
  Administered 2016-04-01: 13:00:00 via INTRAVENOUS

## 2016-04-01 MED ORDER — LEUCOVORIN CALCIUM INJECTION 350 MG
400.0000 mg/m2 | Freq: Once | INTRAVENOUS | Status: AC
  Administered 2016-04-01: 732 mg via INTRAVENOUS
  Filled 2016-04-01: qty 36.6

## 2016-04-01 MED ORDER — SODIUM CHLORIDE 0.9 % IJ SOLN
10.0000 mL | INTRAMUSCULAR | Status: DC | PRN
  Administered 2016-04-01: 10 mL
  Filled 2016-04-01: qty 10

## 2016-04-01 MED ORDER — SODIUM CHLORIDE 0.9 % IV SOLN
2400.0000 mg/m2 | INTRAVENOUS | Status: DC
  Administered 2016-04-01: 4400 mg via INTRAVENOUS
  Filled 2016-04-01: qty 88

## 2016-04-01 MED ORDER — PALONOSETRON HCL INJECTION 0.25 MG/5ML
INTRAVENOUS | Status: AC
  Filled 2016-04-01: qty 5

## 2016-04-01 MED ORDER — FLUOROURACIL CHEMO INJECTION 2.5 GM/50ML
400.0000 mg/m2 | Freq: Once | INTRAVENOUS | Status: AC
  Administered 2016-04-01: 750 mg via INTRAVENOUS
  Filled 2016-04-01: qty 15

## 2016-04-01 MED ORDER — PALONOSETRON HCL INJECTION 0.25 MG/5ML
0.2500 mg | Freq: Once | INTRAVENOUS | Status: AC
  Administered 2016-04-01: 0.25 mg via INTRAVENOUS

## 2016-04-01 MED ORDER — SODIUM CHLORIDE 0.9 % IV SOLN
400.0000 mg | Freq: Once | INTRAVENOUS | Status: AC
  Administered 2016-04-01: 400 mg via INTRAVENOUS
  Filled 2016-04-01: qty 16

## 2016-04-01 MED ORDER — ATROPINE SULFATE 1 MG/ML IJ SOLN
INTRAMUSCULAR | Status: AC
  Filled 2016-04-01: qty 1

## 2016-04-01 MED ORDER — DEXTROSE 5 % IV SOLN
180.0000 mg/m2 | Freq: Once | INTRAVENOUS | Status: AC
  Administered 2016-04-01: 320 mg via INTRAVENOUS
  Filled 2016-04-01: qty 10

## 2016-04-01 MED ORDER — SODIUM CHLORIDE 0.9 % IV SOLN
Freq: Once | INTRAVENOUS | Status: AC
  Administered 2016-04-01: 14:00:00 via INTRAVENOUS

## 2016-04-01 MED ORDER — FOSAPREPITANT DIMEGLUMINE INJECTION 150 MG
Freq: Once | INTRAVENOUS | Status: AC
  Administered 2016-04-01: 14:00:00 via INTRAVENOUS
  Filled 2016-04-01: qty 5

## 2016-04-01 MED ORDER — ATROPINE SULFATE 1 MG/ML IJ SOLN
0.5000 mg | Freq: Once | INTRAMUSCULAR | Status: AC | PRN
  Administered 2016-04-01: 0.5 mg via INTRAVENOUS

## 2016-04-01 MED ORDER — SODIUM CHLORIDE 0.9 % IJ SOLN
10.0000 mL | INTRAMUSCULAR | Status: DC | PRN
  Filled 2016-04-01: qty 10

## 2016-04-01 NOTE — Patient Instructions (Addendum)
[facility] Discharge Instructions for Patients Receiving Chemotherapy  Today you received the following chemotherapy agents: avastin, leucovorin, irinotecan, 45fu   To help prevent nausea and vomiting after your treatment, we encourage you to take your nausea medication.  Take it as often as prescribed.     If you develop nausea and vomiting that is not controlled by your nausea medication, call the clinic. If it is after clinic hours your family physician or the after hours number for the clinic or go to the Emergency Department.   BELOW ARE SYMPTOMS THAT SHOULD BE REPORTED IMMEDIATELY:  *FEVER GREATER THAN 100.5 F  *CHILLS WITH OR WITHOUT FEVER  NAUSEA AND VOMITING THAT IS NOT CONTROLLED WITH YOUR NAUSEA MEDICATION  *UNUSUAL SHORTNESS OF BREATH  *UNUSUAL BRUISING OR BLEEDING  TENDERNESS IN MOUTH AND THROAT WITH OR WITHOUT PRESENCE OF ULCERS  *URINARY PROBLEMS  *BOWEL PROBLEMS  UNUSUAL RASH Items with * indicate a potential emergency and should be followed up as soon as possible.  One of the nurses will contact you 24 hours after your treatment. Please let the nurse know about any problems that you may have experienced. Feel free to call the clinic you have any questions or concerns. The clinic phone number is [phone].   I have been informed and understand all the instructions given to me. I know to contact the clinic, my physician, or go to the Emergency Department if any problems should occur. I do not have any questions at this time, but understand that I may call the clinic during office hours   should I have any questions or need assistance in obtaining follow up care.    __________________________________________  _____________  __________ Signature of Patient or Authorized Representative            Date                   Time    __________________________________________ Nurse's Signature

## 2016-04-01 NOTE — Progress Notes (Signed)
[name]  [phone] [phone]  Clinic follow up Note   Patient Care Team: Christina Mccreedy, [name] as PCP - General (Internal Medicine) Christina Craver, [name] as Consulting Physician (Gastroenterology) Christina [name] as Consulting Physician (General Surgery) Christina [name] as Consulting Physician (Oncology) 03/18/2016  CHIEF COMPLAINTS:  Follow up metastatic colon cancer      Oncology History   Metastatic colon cancer to liver   Staging form: Colon and Rectum, AJCC 7th Edition     Pathologic stage from 04/08/2015: T3, N0, M1 - Signed by [name] on 04/22/2015  Presented to ER with intractable N/V; intermittent rectal bleeding X 3 months      Metastatic colon cancer to liver ([place])   04/03/2015 Procedure    Colonoscopy showed a obstructing sigmoid rectal mass. Biopsy showed adenocarcinoma.      04/06/2015 Tumor Marker    CEA=467.3 / CA19.9=1605      04/06/2015 Imaging    CT chest, abdomen and pelvis with contrast showed sigmoid colon rectal mass, multiple (4) liver metastasis, with the largest 9.1 x 6.1 cm mass in the left lobe..      04/08/2015 Miscellaneous    Foundation one genomic testing showed NRAS G60e and BRAF [id number] mutations      04/08/2015 Initial Diagnosis    Metastatic colon cancer to liver      04/08/2015 Surgery    Laparoscopic sigmoid colectomy, liver biopsy, port cath insertion, by [name]       04/08/2015 Pathology Results    Sigmoid colon segmental resection showed adenocarcinoma with mucinous features, pT 3, 15 lymph nodes all negative, surgical margins were negative. Liver biopsy showed metastatic adenocarcinoma.      05/08/2015 -  Chemotherapy    FOLFIRI every 2 weeks, Avastin was added on from second cycle       07/14/2015 Imaging    CT abdomen and pelvis showed interval slight improvement in liver metastasis. No other new lesions.      10/12/2015 Imaging     CT abdomen and pelvis with contrast showed  continued interval decrease in size of multiple hepatic metastases,  no other new lesions.        HISTORY OF PRESENTING ILLNESS:  [name] 55 y.o. female with past medical history of endometriosis, uterine fibroids, thalassemia trait, iron deficient anemia, who was recently found to have a sigmoid rectal cancer. I initially saw her in the hospital, she is here for the first follow-up.  She has been having intermittent rectal bleeding for the past 3 months. She thought it was related to her endometriosis, did not seek medical attention. She also has intermittent mild nausea, especially in the morning, and left lower quadrant abdominal pain. She was found to have profound anemia with hemoglobin 6.5 last week, and received blood transfusion. She was referred to gastroenterologist [name] last week, underwent colonoscopy 2 days ago, which showed a obstructing sigmoid rectal mass, per patient, the colonoscopy report is not available today. Biopsy was done, but the pathology report is still pending.  She developed severe nausea, and vomited several times with clear gastric liquid. She called [name] office, and I was told to come to [facility] emergency room by on-call physician [name]. She had a CT scan done in the emergency room today.  Her appetite was fairly normal up to 2 days ago before recent hospital admission, when she was found to have a colorectal mass. She lost about 45 pounds in  the past few weeks. 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 family history was positive for colon cancer in her maternal cousin. She is married, lives with her husband, no children.  CURRENT THERAPY: FOLFIRI and Avastin every 2 weeks, started on 05/08/2015, dose reduction and neulasta added from cycle 7  INTERIM HISTORY [name] returns for follow-up and chemo. Her right flank pain and shoulder discomfort has much improved, nearly resolved  lately since she stopped omeprazole. She discovered that omeprazole rarely can cause muscle spasm. She still has mild fatigue, able to tolerate her work very well, but feels tired afterwards, and has been taking nap more frequent. No other new complaints, she otherwise feels well. Her weight is stable.  MEDICAL HISTORY:  Past Medical History:  Diagnosis Date  . Endometriosis   . Hypertension   . met colon ca to liver dx'd 03/2015  . Thalassanemia     SURGICAL HISTORY: Past Surgical History:  Procedure Laterality Date  . COLON RESECTION N/A 04/08/2015   Procedure: LAPAROSCOPIC  RESECTION OF PART OF  SIGMOID COLON;  Surgeon: [name];  Location: [facility];  Service: General;  Laterality: N/A;  . DIAGNOSTIC LAPAROSCOPY     Endometriosis  . LAPAROSCOPIC SIGMOID COLECTOMY  04/08/2015   for colorectal cancer  . LIVER BIOPSY N/A 04/08/2015   Procedure: CORE NEEDLE LIVER BIOPSY;  Surgeon: [name];  Location: [facility];  Service: General;  Laterality: N/A;  . MYOMECTOMY     Gyn in [place]  . PORTACATH PLACEMENT N/A 04/08/2015   Procedure: INSERTION PORT-A-CATH;  Surgeon: [name];  Location: [facility];  Service: General;  Laterality: N/A;    SOCIAL HISTORY: Social History   Social History  . Marital status: Married    Spouse name: N/A  . Number of children: N/A  . Years of education: N/A   Occupational History  . Internal Medicine doctor     Works in [place] Topics  . Smoking status: Never Smoker  . Smokeless tobacco: Never Used  . Alcohol use Yes     Comment: socail drinker   . Drug use: No  . Sexual activity: Not on file   Other Topics Concern  . Not on file   Social History Narrative   Married, husband [name] (married X 2 years)   No children   IM Physician in [place]: Family History  Problem Relation Age of Onset  . Anesthesia problems Cousin 77    maternal cousin, colon cancer   .  Clotting disorder Maternal Grandmother     ALLERGIES:  is allergic to lactose intolerance (gi); milk-related compounds; and nsaids.  MEDICATIONS:  Current Outpatient Prescriptions  Medication Sig Dispense Refill  . cyclobenzaprine (FLEXERIL) 10 MG tablet Take 1 tablet (10 mg total) by mouth 2 (two) times daily as needed for muscle spasms. 10 tablet 0  . dexamethasone (DECADRON) 4 MG tablet Take 1 tablet (4 mg total) by mouth 2 (two) times daily with a meal. 10 tablet 1  . diclofenac (VOLTAREN) 50 MG EC tablet Take 50 mg by mouth daily.    Marland Kitchen ibuprofen (ADVIL,MOTRIN) 200 MG tablet Take 200 mg by mouth every 6 (six) hours as needed for moderate pain.    Marland Kitchen lidocaine-prilocaine (EMLA) cream Apply 1 application topically as needed. 30 g 6  . LORazepam (ATIVAN) 0.5 MG tablet Take 1 tablet (0.5 mg total) by mouth  every 8 (eight) hours. Take 0.5 mg po every 8 hours as needed for nausea/vomiting. (Patient not taking: Reported on 03/17/2016) 30 tablet 0  . NIFEdipine (PROCARDIA-XL/ADALAT CC) 30 MG 24 hr tablet Take 30 mg by mouth daily.    . ondansetron (ZOFRAN) 4 MG tablet Take 1 tablet (4 mg total) by mouth every 6 (six) hours as needed for nausea. 30 tablet 2  . oxyCODONE (OXY IR/ROXICODONE) 5 MG immediate release tablet Take 1 tablet (5 mg total) by mouth every 6 (six) hours as needed for moderate pain. 60 tablet 0  . potassium chloride SA (K-DUR,KLOR-CON) 20 MEQ tablet Take 1 tablet (20 mEq total) by mouth 2 (two) times daily. 30 tablet 1  . prochlorperazine (COMPAZINE) 10 MG tablet Take 1 tablet (10 mg total) by mouth every 6 (six) hours as needed for nausea or vomiting. 30 tablet 1   No current facility-administered medications for this visit.    Facility-Administered Medications Ordered in Other Visits  Medication Dose Route Frequency Provider Last Rate Last Dose  . fluorouracil (ADRUCIL) 4,400 mg in sodium chloride 0.9 % 62 mL chemo infusion  2,400 mg/m2 (Treatment Plan Recorded) Intravenous 1  day or 1 dose [name]   4,400 mg at 04/01/16 1629  . sodium chloride 0.9 % injection 10 mL  10 mL Intracatheter PRN [name]      . sodium chloride 0.9 % injection 10 mL  10 mL Intracatheter PRN [name]   10 mL at 04/01/16 1628    REVIEW OF SYSTEMS:   Constitutional: Denies fevers, chills or abnormal night sweats, (+) fatigue Eyes: Denies blurriness of vision, double vision or watery eyes Ears, nose, mouth, throat, and face: Denies mucositis or sore throat Respiratory: Denies cough, dyspnea or wheezes Cardiovascular: Denies palpitation, chest discomfort or lower extremity swelling Gastrointestinal:  (+) nausea, (+) RUQ and rectal pain, and mild constipation Skin: Denies abnormal skin rashes Lymphatics: Denies new lymphadenopathy or easy bruising Neurological:Denies numbness, tingling or new weaknesses, (+) intermittent right flank pain Behavioral/Psych: Mood is stable, no new changes  All other systems were reviewed with the patient and are negative.  PHYSICAL EXAMINATION: ECOG PERFORMANCE STATUS: 1 BP (!) 143/94 (BP Location: Left Arm, Patient Position: Sitting)   Pulse (!) 108   Temp 98.7 F (37.1 C) (Oral)   Resp 18   Ht 5' 6"  (1.676 m)   Wt 178 lb (80.7 kg)   SpO2 100%   BMI 28.73 kg/m   GENERAL:alert, no distress and comfortable SKIN: skin color, texture, turgor are normal, no rashes or significant lesions EYES: normal, conjunctiva are pink and non-injected, sclera clear OROPHARYNX:no exudate, no erythema and lips, buccal mucosa, and tongue normal  NECK: supple, thyroid normal size, non-tender, without nodularity LYMPH:  no palpable lymphadenopathy in the cervical, axillary or inguinal LUNGS: clear to auscultation and percussion with normal breathing effort HEART: regular rate & rhythm and no murmurs and no lower extremity edema ABDOMEN:abdomen soft, non-tender and normal bowel sounds.  There is a small subcutaneous nodule in the epigastric area, slightly  tender. Musculoskeletal:no cyanosis of digits and no clubbing  PSYCH: alert & oriented x 3 with fluent speech NEURO: no focal motor/sensory deficitsall  LABORATORY DATA:  I have reviewed the data as listed CBC Latest Ref Rng & Units 04/01/2016 03/18/2016 03/17/2016  WBC 3.9 - 10.3 10e3/uL 8.7 7.8 10.3  Hemoglobin 11.6 - 15.9 g/dL 12.3 11.7 12.2  Hematocrit 34.8 - 46.6 % 39.5 38.8 39.9  Platelets  145 - 400 10e3/uL 281 293 326    CMP Latest Ref Rng & Units 04/01/2016 03/18/2016 03/17/2016  Glucose 70 - 140 mg/dl 97 115 107(H)  BUN 7.0 - 26.0 mg/dL 10.1 14.1 10  Creatinine 0.6 - 1.1 mg/dL 1.0 1.1 0.86  Sodium 136 - 145 mEq/L 141 144 140  Potassium 3.5 - 5.1 mEq/L 3.7 3.6 4.0  Chloride 101 - 111 mmol/L - - 109  CO2 22 - 29 mEq/L 25 24 22   Calcium 8.4 - 10.4 mg/dL 10.0 9.9 9.4  Total Protein 6.4 - 8.3 g/dL 7.8 7.8 -  Total Bilirubin 0.20 - 1.20 mg/dL 0.43 0.39 -  Alkaline Phos 40 - 150 U/L 154(H) 150 -  AST 5 - 34 U/L 16 21 -  ALT 0 - 55 U/L 9 14 -  ``  Diagnosis 04/08/2015 1. Liver, needle/core biopsy, ? cancer - METASTATIC ADENOCARCINOMA. 2. Colon, segmental resection for tumor, sigmoid colon mass open end proximal - COLONIC ADENOCARCINOMA WITH MUCINOUS FEATURES EXTENDING INTO PERICOLONIC ADIPOSE TISSUE AND SUBSEROSAL CONNECTIVE TISSUE. - MARGINS NOT INVOLVED. - FIFTEEN BENIGN LYMPH NODES (0/15). 3. Colon, resection margin (donut), distal anastomic ring - BENIGN COLON. - NO EVIDENCE OF MALIGNANCY.  Microscopic Comment Specimen: Sigmoid colon with liver biopsy and anastomotic rings. Procedure: Segmental resection with liver biopsy. Tumor site: Distal sigmoid. Specimen integrity: Intact. Macroscopic intactness of mesorectum: N/A Macroscopic tumor perforation: No. Invasive tumor: Maximum size: 8 cm Histologic type(s): Colorectal adenocarcinoma with mucinous features. Histologic grade and differentiation: G2: moderately differentiated/low grade Type of polyp in which invasive  carcinoma arose: No residual polyp. Microscopic extension of invasive tumor: Into pericolonic adipose tissue and subserosal connective tissue. Lymph-Vascular invasion: Present. Peri-neural invasion: Present. Tumor deposit(s) (discontinuous extramural extension): No. Resection margins: Proximal margin: Free of tumor. Distal margin: Free of tumor. Circumferential (radial) (posterior ascending, posterior descending; lateral and posterior mid-rectum; and entire lower 1/3 rectum): N/A Mesenteric margin (sigmoid and transverse): Free of tumor. Distance closest margin (if all above margins negative): 2 cm from mesenteric margin. Treatment effect (neo-adjuvant therapy): No. Additional polyp(s): No. Non-neoplastic findings: None. Lymph nodes: number examined - 15; number positive: 0 Pathologic Staging: pT3, pN0, pM1 Ancillary studies: MMR pending. (JDP:gt, 04/10/15) 2. Mismatch Repair (MMR) Protein Immunohistochemistry (IHC) IHC Expression Result: MLH1: Preserved nuclear expression (greater 50% tumor expression) MSH2: Preserved nuclear expression (greater 50% tumor expression) MSH6: Preserved nuclear expression (greater 50% tumor expression) PMS2: Preserved nuclear expression (greater 50% tumor expression) * Internal control demonstrates intact nuclear expression Interpretation: NORMAL   FoundationOne test result   RADIOGRAPHIC STUDIES: I have personally reviewed the radiological images as listed and agreed with the findings in the report.  Ct chest, abdomen and pelvis W Contrast 01/14/2016 IMPRESSION: 1. Liver metastases are stable to mildly decreased in size. No new or progressive metastatic disease. 2. Nodular curvilinear soft tissue density in the midline ventral upper abdominal wall, stable back to 07/14/2015, presumably postsurgical scarring, although cannot exclude stable superficial tumor seeding a surgical tract. 3. No evidence of local tumor recurrence at the distal  colonic anastomosis. 4. Additional findings include 1 vessel coronary atherosclerosis, stable dilated main pulmonary artery suggesting chronic pulmonary arterial hypertension, trace bilateral pleural effusions, cholelithiasis, tiny hiatal hernia and bulky myomatous uterus.  Bone scan 03/31/2016 IMPRESSION: Negative bone scan.  ASSESSMENT & PLAN:  55 year old African-American female, with past medical history of hypertension, thalassemia trait, iron deficient anemia, uterine fibroids, endometriosis, who presents with intermittent rectal bleeding, mild nausea, and mild intermittent left lower quadrant abdominal pain.  1. Sigmoid rectal adenocarcinoma, with liver metastases, pT3N0M1, stage IV, MMR normal, NRAS and BRAF mutation (+)  -I reviewed her surgical pathology findings with her in great details. Her liver biopsy confirmed metastatic adenocarcinoma  From colon cancer. Her primary sigmoid rectal tumor has been completely removed. -We again reviewed her CT findings, it showed at least 4 liver metastatic lesion, with the largest one measuring 9.1 cm in the left lobe. No other distant metastasis -We reviewed the natural history of metastatic colon cancer, and incurable nature at this is stage, giving her large metastatic disease in the liver. We also discussed that if she had excellent response to chemotherapy, I'll present her restaging CT scan in our GI tumor Board, to see if she is a candidate for liver surgery or liver targeted therapy to maximally controlled her disease.  -Her tumor has BRAF mutation, which is a poor prognostic factor  -Her tumor has NRAS mutation, not a candidate for EGFR inhibitor  -Given the MSI-stable, she might not benefit from immunotherapy, except in a clinically trial setting  -I reviewed her restaging CT scan from 01/14/2016, which showed slight improvement in liver mets, no other new lesions, she has had a good partial response so far.  - her tumor marker CEA has  been trending down lately, corresponding to good response to chemotherapy -I discussed her recent CT abdomen which was done in the ED yesterday, her liver metastasis stable.  -I discussed her recent bone scan, which showed no evidence of bone metastasis. -Her muscle spasm and pain has resolved after she stopped (result. -She is clinically doing well, tolerating treatment very well, lab reviewed, normal CBC and CMP. We'll continue chemotherapy until disease progression or intolerance. We discussed chemotherapy break, she doesn't need break for now.   2. Right frank and shoulder pain -Possible muscle spasm, probably related to omeprazole, which she has stopped -resolved now    3 Iron deficient Anemia, and beta thalassemia trait  -Secondary to GI bleeding and iron deficiency -Ferritin level was 8, consistent with iron deficiency from GI bleeding -She received Feraheme 510 mg twice,  responded very well. Her anemia resolved, even she is getting chemotherapy.  -her repeated iron study on 12/18/2015 showed ferritin 317 and normal serum iron   5. Hypertension -She has no history of hypertension. Blood pressure has been slightly elevated in the past months, repeated normal. Possibly related to Avastin -I'll continue monitoring her blood pressure closely. Consider adding amlodipine if persistent high.  6. Peripheral neuropathy, G1 -Secondary to chemotherapy, which has been mild and stable, usually resolves before her next cycle chemo  -we'll watch closely. If gets worse, we'll consider dose reduction. -I recommend her to try Neurontin, she declined due to her sensitivity to sedative medication  Plan -Lab reviewed, will proceed chemo FOLFIRI and avastin today and continue every 2 weeks, with Neulasta on day 3 -I will see her back in 4 weeks   All questions were answered. The patient knows to call the clinic with any problems, questions or concerns. I spent 20 minutes counseling the patient face to  face. The total time spent in the appointment was 25 minutes and more than 50% was on counseling.     [name] 04/01/2016

## 2016-04-03 ENCOUNTER — Ambulatory Visit (HOSPITAL_BASED_OUTPATIENT_CLINIC_OR_DEPARTMENT_OTHER): Payer: BLUE CROSS/BLUE SHIELD

## 2016-04-03 ENCOUNTER — Ambulatory Visit: Payer: BLUE CROSS/BLUE SHIELD

## 2016-04-03 VITALS — BP 141/85 | HR 95 | Temp 98.6°F | Resp 20

## 2016-04-03 DIAGNOSIS — C187 Malignant neoplasm of sigmoid colon: Secondary | ICD-10-CM | POA: Diagnosis not present

## 2016-04-03 DIAGNOSIS — C787 Secondary malignant neoplasm of liver and intrahepatic bile duct: Principal | ICD-10-CM

## 2016-04-03 DIAGNOSIS — C189 Malignant neoplasm of colon, unspecified: Secondary | ICD-10-CM

## 2016-04-03 MED ORDER — HEPARIN SOD (PORK) LOCK FLUSH 100 UNIT/ML IV SOLN
500.0000 [IU] | Freq: Once | INTRAVENOUS | Status: AC | PRN
  Administered 2016-04-03: 500 [IU]
  Filled 2016-04-03: qty 5

## 2016-04-03 MED ORDER — PEGFILGRASTIM INJECTION 6 MG/0.6ML ~~LOC~~
6.0000 mg | PREFILLED_SYRINGE | Freq: Once | SUBCUTANEOUS | Status: AC
  Administered 2016-04-03: 6 mg via SUBCUTANEOUS

## 2016-04-03 MED ORDER — SODIUM CHLORIDE 0.9 % IJ SOLN
10.0000 mL | INTRAMUSCULAR | Status: DC | PRN
  Administered 2016-04-03: 10 mL
  Filled 2016-04-03: qty 10

## 2016-04-03 NOTE — Patient Instructions (Signed)
Pegfilgrastim injection What is this medicine? PEGFILGRASTIM (PEG fil gra stim) is a long-acting granulocyte colony-stimulating factor that stimulates the growth of neutrophils, a type of white blood cell important in the body's fight against infection. It is used to reduce the incidence of fever and infection in patients with certain types of cancer who are receiving chemotherapy that affects the bone marrow, and to increase survival after being exposed to high doses of radiation. This medicine may be used for other purposes; ask your health care provider or pharmacist if you have questions. What should I tell my health care provider before I take this medicine? They need to know if you have any of these conditions: -kidney disease -latex allergy -ongoing radiation therapy -sickle cell disease -skin reactions to acrylic adhesives (On-Body Injector only) -an unusual or allergic reaction to pegfilgrastim, filgrastim, other medicines, foods, dyes, or preservatives -pregnant or trying to get pregnant -breast-feeding How should I use this medicine? This medicine is for injection under the skin. If you get this medicine at home, you will be taught how to prepare and give the pre-filled syringe or how to use the On-body Injector. Refer to the patient Instructions for Use for detailed instructions. Use exactly as directed. Take your medicine at regular intervals. Do not take your medicine more often than directed. It is important that you put your used needles and syringes in a special sharps container. Do not put them in a trash can. If you do not have a sharps container, call your pharmacist or healthcare provider to get one. Talk to your pediatrician regarding the use of this medicine in children. While this drug may be prescribed for selected conditions, precautions do apply. Overdosage: If you think you have taken too much of this medicine contact a poison control center or emergency room at  once. NOTE: This medicine is only for you. Do not share this medicine with others. What if I miss a dose? It is important not to miss your dose. Call your doctor or health care professional if you miss your dose. If you miss a dose due to an On-body Injector failure or leakage, a new dose should be administered as soon as possible using a single prefilled syringe for manual use. What may interact with this medicine? Interactions have not been studied. Give your health care provider a list of all the medicines, herbs, non-prescription drugs, or dietary supplements you use. Also tell them if you smoke, drink alcohol, or use illegal drugs. Some items may interact with your medicine. This list may not describe all possible interactions. Give your health care provider a list of all the medicines, herbs, non-prescription drugs, or dietary supplements you use. Also tell them if you smoke, drink alcohol, or use illegal drugs. Some items may interact with your medicine. What should I watch for while using this medicine? You may need blood work done while you are taking this medicine. If you are going to need a MRI, CT scan, or other procedure, tell your doctor that you are using this medicine (On-Body Injector only). What side effects may I notice from receiving this medicine? Side effects that you should report to your doctor or health care professional as soon as possible: -allergic reactions like skin rash, itching or hives, swelling of the face, lips, or tongue -dizziness -fever -pain, redness, or irritation at site where injected -pinpoint red spots on the skin -red or dark-brown urine -shortness of breath or breathing problems -stomach or side pain, or pain   at the shoulder -swelling -tiredness -trouble passing urine or change in the amount of urine Side effects that usually do not require medical attention (report to your doctor or health care professional if they continue or are  bothersome): -bone pain -muscle pain This list may not describe all possible side effects. Call your doctor for medical advice about side effects. You may report side effects to FDA at 1-800-FDA-1088. Where should I keep my medicine? Keep out of the reach of children. Store pre-filled syringes in a refrigerator between 2 and 8 degrees C (36 and 46 degrees F). Do not freeze. Keep in carton to protect from light. Throw away this medicine if it is left out of the refrigerator for more than 48 hours. Throw away any unused medicine after the expiration date. NOTE: This sheet is a summary. It may not cover all possible information. If you have questions about this medicine, talk to your doctor, pharmacist, or health care provider.    2016, Elsevier/Gold Standard. (2014-08-15 14:30:14)  

## 2016-04-08 ENCOUNTER — Other Ambulatory Visit: Payer: Self-pay | Admitting: Hematology

## 2016-04-14 ENCOUNTER — Other Ambulatory Visit: Payer: Self-pay | Admitting: Hematology

## 2016-04-15 ENCOUNTER — Ambulatory Visit (HOSPITAL_BASED_OUTPATIENT_CLINIC_OR_DEPARTMENT_OTHER): Payer: BLUE CROSS/BLUE SHIELD

## 2016-04-15 ENCOUNTER — Other Ambulatory Visit (HOSPITAL_BASED_OUTPATIENT_CLINIC_OR_DEPARTMENT_OTHER): Payer: BLUE CROSS/BLUE SHIELD

## 2016-04-15 ENCOUNTER — Other Ambulatory Visit: Payer: Self-pay | Admitting: Hematology

## 2016-04-15 VITALS — BP 114/72 | HR 89 | Temp 98.5°F | Resp 17

## 2016-04-15 DIAGNOSIS — C787 Secondary malignant neoplasm of liver and intrahepatic bile duct: Secondary | ICD-10-CM

## 2016-04-15 DIAGNOSIS — Z5112 Encounter for antineoplastic immunotherapy: Secondary | ICD-10-CM | POA: Diagnosis not present

## 2016-04-15 DIAGNOSIS — C187 Malignant neoplasm of sigmoid colon: Secondary | ICD-10-CM | POA: Diagnosis not present

## 2016-04-15 DIAGNOSIS — Z452 Encounter for adjustment and management of vascular access device: Secondary | ICD-10-CM | POA: Diagnosis not present

## 2016-04-15 DIAGNOSIS — C189 Malignant neoplasm of colon, unspecified: Secondary | ICD-10-CM

## 2016-04-15 DIAGNOSIS — D500 Iron deficiency anemia secondary to blood loss (chronic): Secondary | ICD-10-CM

## 2016-04-15 DIAGNOSIS — Z95828 Presence of other vascular implants and grafts: Secondary | ICD-10-CM

## 2016-04-15 DIAGNOSIS — Z5111 Encounter for antineoplastic chemotherapy: Secondary | ICD-10-CM | POA: Diagnosis not present

## 2016-04-15 LAB — CBC WITH DIFFERENTIAL/PLATELET
BASO%: 0.3 % (ref 0.0–2.0)
Basophils Absolute: 0 10*3/uL (ref 0.0–0.1)
EOS%: 1.6 % (ref 0.0–7.0)
Eosinophils Absolute: 0.1 10*3/uL (ref 0.0–0.5)
HCT: 39 % (ref 34.8–46.6)
HGB: 11.8 g/dL (ref 11.6–15.9)
LYMPH%: 18.8 % (ref 14.0–49.7)
MCH: 22.6 pg — ABNORMAL LOW (ref 25.1–34.0)
MCHC: 30.3 g/dL — ABNORMAL LOW (ref 31.5–36.0)
MCV: 74.6 fL — AB (ref 79.5–101.0)
MONO#: 0.8 10*3/uL (ref 0.1–0.9)
MONO%: 9.2 % (ref 0.0–14.0)
NEUT%: 70.1 % (ref 38.4–76.8)
NEUTROS ABS: 5.9 10*3/uL (ref 1.5–6.5)
Platelets: 304 10*3/uL (ref 145–400)
RBC: 5.23 10*6/uL (ref 3.70–5.45)
RDW: 19.9 % — ABNORMAL HIGH (ref 11.2–14.5)
WBC: 8.4 10*3/uL (ref 3.9–10.3)
lymph#: 1.6 10*3/uL (ref 0.9–3.3)

## 2016-04-15 LAB — COMPREHENSIVE METABOLIC PANEL
ALT: 10 U/L (ref 0–55)
ANION GAP: 11 meq/L (ref 3–11)
AST: 15 U/L (ref 5–34)
Albumin: 3.7 g/dL (ref 3.5–5.0)
Alkaline Phosphatase: 154 U/L — ABNORMAL HIGH (ref 40–150)
BUN: 9.8 mg/dL (ref 7.0–26.0)
CHLORIDE: 109 meq/L (ref 98–109)
CO2: 23 meq/L (ref 22–29)
CREATININE: 1 mg/dL (ref 0.6–1.1)
Calcium: 9.4 mg/dL (ref 8.4–10.4)
EGFR: 71 mL/min/{1.73_m2} — ABNORMAL LOW (ref >90)
Glucose: 111 mg/dl (ref 70–140)
Potassium: 3.3 mEq/L — ABNORMAL LOW (ref 3.5–5.1)
Sodium: 143 mEq/L (ref 136–145)
Total Bilirubin: 0.48 mg/dL (ref 0.20–1.20)
Total Protein: 7.5 g/dL (ref 6.4–8.3)

## 2016-04-15 LAB — CEA (IN HOUSE-CHCC): CEA (CHCC-IN HOUSE): 148.93 ng/mL — AB (ref 0.00–5.00)

## 2016-04-15 MED ORDER — SODIUM CHLORIDE 0.9 % IV SOLN
2400.0000 mg/m2 | INTRAVENOUS | Status: DC
  Administered 2016-04-15: 4400 mg via INTRAVENOUS
  Filled 2016-04-15: qty 88

## 2016-04-15 MED ORDER — SODIUM CHLORIDE 0.9 % IJ SOLN
10.0000 mL | INTRAMUSCULAR | Status: DC | PRN
  Administered 2016-04-15: 10 mL via INTRAVENOUS
  Filled 2016-04-15: qty 10

## 2016-04-15 MED ORDER — SODIUM CHLORIDE 0.9 % IV SOLN
5.5000 mg/kg | Freq: Once | INTRAVENOUS | Status: AC
  Administered 2016-04-15: 400 mg via INTRAVENOUS
  Filled 2016-04-15: qty 16

## 2016-04-15 MED ORDER — HEPARIN SOD (PORK) LOCK FLUSH 100 UNIT/ML IV SOLN
500.0000 [IU] | Freq: Once | INTRAVENOUS | Status: DC | PRN
  Filled 2016-04-15: qty 5

## 2016-04-15 MED ORDER — PALONOSETRON HCL INJECTION 0.25 MG/5ML
INTRAVENOUS | Status: AC
  Filled 2016-04-15: qty 5

## 2016-04-15 MED ORDER — SODIUM CHLORIDE 0.9 % IV SOLN
Freq: Once | INTRAVENOUS | Status: AC
Start: 2016-04-15 — End: 2016-04-15
  Administered 2016-04-15: 12:00:00 via INTRAVENOUS

## 2016-04-15 MED ORDER — SODIUM CHLORIDE 0.9 % IJ SOLN
10.0000 mL | INTRAMUSCULAR | Status: DC | PRN
  Filled 2016-04-15: qty 10

## 2016-04-15 MED ORDER — IRINOTECAN HCL CHEMO INJECTION 100 MG/5ML
180.0000 mg/m2 | Freq: Once | INTRAVENOUS | Status: AC
  Administered 2016-04-15: 320 mg via INTRAVENOUS
  Filled 2016-04-15: qty 10

## 2016-04-15 MED ORDER — ATROPINE SULFATE 1 MG/ML IJ SOLN
INTRAMUSCULAR | Status: AC
  Filled 2016-04-15: qty 1

## 2016-04-15 MED ORDER — ATROPINE SULFATE 1 MG/ML IJ SOLN
0.5000 mg | Freq: Once | INTRAMUSCULAR | Status: AC | PRN
  Administered 2016-04-15: 0.5 mg via INTRAVENOUS

## 2016-04-15 MED ORDER — FOSAPREPITANT DIMEGLUMINE INJECTION 150 MG
Freq: Once | INTRAVENOUS | Status: AC
  Administered 2016-04-15: 12:00:00 via INTRAVENOUS
  Filled 2016-04-15: qty 5

## 2016-04-15 MED ORDER — PALONOSETRON HCL INJECTION 0.25 MG/5ML
0.2500 mg | Freq: Once | INTRAVENOUS | Status: AC
  Administered 2016-04-15: 0.25 mg via INTRAVENOUS

## 2016-04-15 MED ORDER — LEUCOVORIN CALCIUM INJECTION 350 MG
400.0000 mg/m2 | Freq: Once | INTRAVENOUS | Status: AC
  Administered 2016-04-15: 732 mg via INTRAVENOUS
  Filled 2016-04-15: qty 36.6

## 2016-04-15 NOTE — Patient Instructions (Addendum)
[facility] Discharge Instructions for Patients Receiving Chemotherapy  Today you received the following chemotherapy agents: Avastin,Leucovorin, Irinotecan, Adrucil   To help prevent nausea and vomiting after your treatment, we encourage you to take your nausea medication.  Take it as often as prescribed.     If you develop nausea and vomiting that is not controlled by your nausea medication, call the clinic. If it is after clinic hours your family physician or the after hours number for the clinic or go to the Emergency Department.   BELOW ARE SYMPTOMS THAT SHOULD BE REPORTED IMMEDIATELY:  *FEVER GREATER THAN 100.5 F  *CHILLS WITH OR WITHOUT FEVER  NAUSEA AND VOMITING THAT IS NOT CONTROLLED WITH YOUR NAUSEA MEDICATION  *UNUSUAL SHORTNESS OF BREATH  *UNUSUAL BRUISING OR BLEEDING  TENDERNESS IN MOUTH AND THROAT WITH OR WITHOUT PRESENCE OF ULCERS  *URINARY PROBLEMS  *BOWEL PROBLEMS  UNUSUAL RASH Items with * indicate a potential emergency and should be followed up as soon as possible.  

## 2016-04-16 LAB — CEA: CEA: 140.7 ng/mL — ABNORMAL HIGH (ref 0.0–4.7)

## 2016-04-17 ENCOUNTER — Ambulatory Visit (HOSPITAL_BASED_OUTPATIENT_CLINIC_OR_DEPARTMENT_OTHER): Payer: BLUE CROSS/BLUE SHIELD

## 2016-04-17 VITALS — BP 148/97 | HR 85 | Temp 98.8°F | Resp 16

## 2016-04-17 DIAGNOSIS — C189 Malignant neoplasm of colon, unspecified: Secondary | ICD-10-CM

## 2016-04-17 DIAGNOSIS — C187 Malignant neoplasm of sigmoid colon: Secondary | ICD-10-CM

## 2016-04-17 DIAGNOSIS — C787 Secondary malignant neoplasm of liver and intrahepatic bile duct: Secondary | ICD-10-CM

## 2016-04-17 MED ORDER — SODIUM CHLORIDE 0.9 % IJ SOLN
10.0000 mL | INTRAMUSCULAR | Status: DC | PRN
  Administered 2016-04-17: 10 mL
  Filled 2016-04-17: qty 10

## 2016-04-17 MED ORDER — HEPARIN SOD (PORK) LOCK FLUSH 100 UNIT/ML IV SOLN
500.0000 [IU] | Freq: Once | INTRAVENOUS | Status: AC | PRN
  Administered 2016-04-17: 500 [IU]
  Filled 2016-04-17: qty 5

## 2016-04-17 MED ORDER — PEGFILGRASTIM INJECTION 6 MG/0.6ML ~~LOC~~
6.0000 mg | PREFILLED_SYRINGE | Freq: Once | SUBCUTANEOUS | Status: AC
  Administered 2016-04-17: 6 mg via SUBCUTANEOUS

## 2016-04-19 ENCOUNTER — Telehealth: Payer: Self-pay | Admitting: Hematology

## 2016-04-19 NOTE — Telephone Encounter (Signed)
Called patient to conf appt per 04/04/16 los.  appt ltr and schd mailed.

## 2016-04-29 ENCOUNTER — Ambulatory Visit (HOSPITAL_BASED_OUTPATIENT_CLINIC_OR_DEPARTMENT_OTHER): Payer: BLUE CROSS/BLUE SHIELD

## 2016-04-29 ENCOUNTER — Encounter: Payer: Self-pay | Admitting: Hematology

## 2016-04-29 ENCOUNTER — Ambulatory Visit (HOSPITAL_BASED_OUTPATIENT_CLINIC_OR_DEPARTMENT_OTHER): Payer: BLUE CROSS/BLUE SHIELD | Admitting: Hematology

## 2016-04-29 ENCOUNTER — Telehealth: Payer: Self-pay | Admitting: Hematology

## 2016-04-29 ENCOUNTER — Other Ambulatory Visit (HOSPITAL_BASED_OUTPATIENT_CLINIC_OR_DEPARTMENT_OTHER): Payer: BLUE CROSS/BLUE SHIELD

## 2016-04-29 ENCOUNTER — Telehealth: Payer: Self-pay | Admitting: *Deleted

## 2016-04-29 VITALS — BP 144/84 | HR 84 | Temp 98.4°F | Resp 18 | Ht 66.0 in | Wt 177.6 lb

## 2016-04-29 DIAGNOSIS — Z452 Encounter for adjustment and management of vascular access device: Secondary | ICD-10-CM

## 2016-04-29 DIAGNOSIS — C187 Malignant neoplasm of sigmoid colon: Secondary | ICD-10-CM

## 2016-04-29 DIAGNOSIS — D561 Beta thalassemia: Secondary | ICD-10-CM | POA: Diagnosis not present

## 2016-04-29 DIAGNOSIS — D500 Iron deficiency anemia secondary to blood loss (chronic): Secondary | ICD-10-CM

## 2016-04-29 DIAGNOSIS — Z5112 Encounter for antineoplastic immunotherapy: Secondary | ICD-10-CM

## 2016-04-29 DIAGNOSIS — C787 Secondary malignant neoplasm of liver and intrahepatic bile duct: Secondary | ICD-10-CM

## 2016-04-29 DIAGNOSIS — C189 Malignant neoplasm of colon, unspecified: Secondary | ICD-10-CM

## 2016-04-29 DIAGNOSIS — G620 Drug-induced polyneuropathy: Secondary | ICD-10-CM

## 2016-04-29 DIAGNOSIS — I10 Essential (primary) hypertension: Secondary | ICD-10-CM

## 2016-04-29 DIAGNOSIS — Z5111 Encounter for antineoplastic chemotherapy: Secondary | ICD-10-CM | POA: Diagnosis not present

## 2016-04-29 DIAGNOSIS — Z95828 Presence of other vascular implants and grafts: Secondary | ICD-10-CM

## 2016-04-29 LAB — COMPREHENSIVE METABOLIC PANEL
ALT: 11 U/L (ref 0–55)
AST: 16 U/L (ref 5–34)
Albumin: 3.7 g/dL (ref 3.5–5.0)
Alkaline Phosphatase: 146 U/L (ref 40–150)
Anion Gap: 12 mEq/L — ABNORMAL HIGH (ref 3–11)
BUN: 9.9 mg/dL (ref 7.0–26.0)
CO2: 22 meq/L (ref 22–29)
Calcium: 9.5 mg/dL (ref 8.4–10.4)
Chloride: 108 mEq/L (ref 98–109)
Creatinine: 1 mg/dL (ref 0.6–1.1)
EGFR: 71 mL/min/{1.73_m2} — AB (ref >90)
GLUCOSE: 122 mg/dL (ref 70–140)
POTASSIUM: 3.7 meq/L (ref 3.5–5.1)
SODIUM: 142 meq/L (ref 136–145)
Total Bilirubin: 0.5 mg/dL (ref 0.20–1.20)
Total Protein: 7.7 g/dL (ref 6.4–8.3)

## 2016-04-29 LAB — CBC WITH DIFFERENTIAL/PLATELET
BASO%: 0.3 % (ref 0.0–2.0)
BASOS ABS: 0 10*3/uL (ref 0.0–0.1)
EOS ABS: 0.2 10*3/uL (ref 0.0–0.5)
EOS%: 1.6 % (ref 0.0–7.0)
HEMATOCRIT: 39.2 % (ref 34.8–46.6)
HEMOGLOBIN: 11.7 g/dL (ref 11.6–15.9)
LYMPH#: 1.8 10*3/uL (ref 0.9–3.3)
LYMPH%: 19 % (ref 14.0–49.7)
MCH: 23 pg — AB (ref 25.1–34.0)
MCHC: 29.8 g/dL — ABNORMAL LOW (ref 31.5–36.0)
MCV: 77 fL — AB (ref 79.5–101.0)
MONO#: 0.6 10*3/uL (ref 0.1–0.9)
MONO%: 6.2 % (ref 0.0–14.0)
NEUT%: 72.9 % (ref 38.4–76.8)
NEUTROS ABS: 7 10*3/uL — AB (ref 1.5–6.5)
Platelets: 295 10*3/uL (ref 145–400)
RBC: 5.09 10*6/uL (ref 3.70–5.45)
RDW: 19.6 % — AB (ref 11.2–14.5)
WBC: 9.7 10*3/uL (ref 3.9–10.3)

## 2016-04-29 LAB — UA PROTEIN, DIPSTICK - CHCC: Protein, ur: 30 mg/dL

## 2016-04-29 LAB — CEA (IN HOUSE-CHCC): CEA (CHCC-In House): 134.32 ng/mL — ABNORMAL HIGH (ref 0.00–5.00)

## 2016-04-29 MED ORDER — FOSAPREPITANT DIMEGLUMINE INJECTION 150 MG
Freq: Once | INTRAVENOUS | Status: AC
  Administered 2016-04-29: 12:00:00 via INTRAVENOUS
  Filled 2016-04-29: qty 5

## 2016-04-29 MED ORDER — ATROPINE SULFATE 1 MG/ML IJ SOLN
0.5000 mg | Freq: Once | INTRAMUSCULAR | Status: AC | PRN
  Administered 2016-04-29: 0.5 mg via INTRAVENOUS

## 2016-04-29 MED ORDER — SODIUM CHLORIDE 0.9 % IV SOLN
5.5000 mg/kg | Freq: Once | INTRAVENOUS | Status: AC
  Administered 2016-04-29: 400 mg via INTRAVENOUS
  Filled 2016-04-29: qty 16

## 2016-04-29 MED ORDER — PALONOSETRON HCL INJECTION 0.25 MG/5ML
0.2500 mg | Freq: Once | INTRAVENOUS | Status: AC
  Administered 2016-04-29: 0.25 mg via INTRAVENOUS

## 2016-04-29 MED ORDER — DEXTROSE 5 % IV SOLN
180.0000 mg/m2 | Freq: Once | INTRAVENOUS | Status: AC
  Administered 2016-04-29: 320 mg via INTRAVENOUS
  Filled 2016-04-29: qty 6

## 2016-04-29 MED ORDER — ATROPINE SULFATE 1 MG/ML IJ SOLN
INTRAMUSCULAR | Status: AC
  Filled 2016-04-29: qty 1

## 2016-04-29 MED ORDER — LEUCOVORIN CALCIUM INJECTION 350 MG
400.0000 mg/m2 | Freq: Once | INTRAVENOUS | Status: AC
  Administered 2016-04-29: 732 mg via INTRAVENOUS
  Filled 2016-04-29: qty 36.6

## 2016-04-29 MED ORDER — PALONOSETRON HCL INJECTION 0.25 MG/5ML
INTRAVENOUS | Status: AC
  Filled 2016-04-29: qty 5

## 2016-04-29 MED ORDER — SODIUM CHLORIDE 0.9 % IV SOLN
Freq: Once | INTRAVENOUS | Status: AC
  Administered 2016-04-29: 11:00:00 via INTRAVENOUS

## 2016-04-29 MED ORDER — SODIUM CHLORIDE 0.9 % IV SOLN
2400.0000 mg/m2 | INTRAVENOUS | Status: DC
  Administered 2016-04-29: 4400 mg via INTRAVENOUS
  Filled 2016-04-29: qty 88

## 2016-04-29 MED ORDER — SODIUM CHLORIDE 0.9 % IJ SOLN
10.0000 mL | INTRAMUSCULAR | Status: DC | PRN
  Administered 2016-04-29: 10 mL via INTRAVENOUS
  Filled 2016-04-29: qty 10

## 2016-04-29 NOTE — Patient Instructions (Signed)

## 2016-04-29 NOTE — Patient Instructions (Signed)
[facility] Discharge Instructions for Patients Receiving Chemotherapy  Today you received the following chemotherapy agents: Avastin,Leucovorin, Irinotecan, Adrucil   To help prevent nausea and vomiting after your treatment, we encourage you to take your nausea medication.  Take it as often as prescribed.     If you develop nausea and vomiting that is not controlled by your nausea medication, call the clinic. If it is after clinic hours your family physician or the after hours number for the clinic or go to the Emergency Department.   BELOW ARE SYMPTOMS THAT SHOULD BE REPORTED IMMEDIATELY:  *FEVER GREATER THAN 100.5 F  *CHILLS WITH OR WITHOUT FEVER  NAUSEA AND VOMITING THAT IS NOT CONTROLLED WITH YOUR NAUSEA MEDICATION  *UNUSUAL SHORTNESS OF BREATH  *UNUSUAL BRUISING OR BLEEDING  TENDERNESS IN MOUTH AND THROAT WITH OR WITHOUT PRESENCE OF ULCERS  *URINARY PROBLEMS  *BOWEL PROBLEMS  UNUSUAL RASH Items with * indicate a potential emergency and should be followed up as soon as possible.  

## 2016-04-29 NOTE — Telephone Encounter (Signed)
Per LOS I have scheduled appts and notified the scheduler 

## 2016-04-29 NOTE — Telephone Encounter (Signed)
Message sent to infusion scheduler to be added. Lab, flush and MD appt scheduled for 05/27/16. Avs report and appointment schedule given to patient, per 04/29/16 los.

## 2016-04-29 NOTE — Progress Notes (Signed)
[place]  [phone] [phone]  Clinic follow up Note   Patient Care Team: Benito Mccreedy, MD as PCP - General (Internal Medicine) Juanita Craver, MD as Consulting Physician (Gastroenterology) Michael Boston, MD as Consulting Physician (General Surgery) Truitt Merle, MD as Consulting Physician (Oncology) 03/18/2016  CHIEF COMPLAINTS:  Follow up metastatic colon cancer      Oncology History   Metastatic colon cancer to liver   Staging form: Colon and Rectum, AJCC 7th Edition     Pathologic stage from 04/08/2015: T3, N0, M1 - Signed by Truitt Merle, MD on 04/22/2015  Presented to ER with intractable N/V; intermittent rectal bleeding X 3 months      Metastatic colon cancer to liver ([place])   04/03/2015 Procedure    Colonoscopy showed a obstructing sigmoid rectal mass. Biopsy showed adenocarcinoma.      04/06/2015 Tumor Marker    CEA=467.3 / CA19.9=1605      04/06/2015 Imaging    CT chest, abdomen and pelvis with contrast showed sigmoid colon rectal mass, multiple (4) liver metastasis, with the largest 9.1 x 6.1 cm mass in the left lobe..      04/08/2015 Miscellaneous    Foundation one genomic testing showed NRAS G60e and BRAF [id number] mutations      04/08/2015 Initial Diagnosis    Metastatic colon cancer to liver      04/08/2015 Surgery    Laparoscopic sigmoid colectomy, liver biopsy, port cath insertion, by Dr. Johney Maine       04/08/2015 Pathology Results    Sigmoid colon segmental resection showed adenocarcinoma with mucinous features, pT 3, 15 lymph nodes all negative, surgical margins were negative. Liver biopsy showed metastatic adenocarcinoma.      05/08/2015 -  Chemotherapy    FOLFIRI every 2 weeks, Avastin was added on from second cycle       07/14/2015 Imaging    CT abdomen and pelvis showed interval slight improvement in liver metastasis. No other new lesions.      10/12/2015 Imaging     CT abdomen and pelvis with contrast showed  continued interval decrease in size of multiple hepatic metastases,  no other new lesions.        HISTORY OF PRESENTING ILLNESS:  Christina [name] 55 y.o. [name] with past medical history of endometriosis, uterine fibroids, thalassemia trait, iron deficient anemia, who was recently found to have a sigmoid rectal cancer. I initially saw her in the hospital, she is here for the first follow-up.  She has been having intermittent rectal bleeding for the past 3 months. She thought it was related to her endometriosis, did not seek medical attention. She also has intermittent mild nausea, especially in the morning, and left lower quadrant abdominal pain. She was found to have profound anemia with hemoglobin 6.5 last week, and received blood transfusion. She was referred to gastroenterologist Dr. Collene Mares last week, underwent colonoscopy 2 days ago, which showed a obstructing sigmoid rectal mass, per patient, the colonoscopy report is not available today. Biopsy was done, but the pathology report is still pending.  She developed severe nausea, and vomited several times with clear gastric liquid. She called [name], and I was told to come to [facility] emergency room by on-call physician Dr. Carlean Purl. She had a CT scan done in the emergency room today.  Her appetite was fairly normal up to 2 days ago before recent hospital admission, when she was found to have a colorectal mass. She lost about 45 pounds in  the past few weeks. 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 family history was positive for colon cancer in her maternal cousin. She is married, lives with her husband, no children.  CURRENT THERAPY: FOLFIRI and Avastin every 2 weeks, started on 05/08/2015, dose reduction and neulasta added from cycle 7 due to prolonged neutropenia  INTERIM HISTORY [name] returns for follow-up and chemo. She is doing well overall. She now takes Toms for her  acid reflux, and she has not had much right flank pain. Her fatigue is mild and stable, she is able to tolerate her work, although she has decreased her hours lately. No other new complains.   MEDICAL HISTORY:  Past Medical History:  Diagnosis Date  . Endometriosis   . Hypertension   . met colon ca to liver dx'd 03/2015  . Thalassanemia     SURGICAL HISTORY: Past Surgical History:  Procedure Laterality Date  . COLON RESECTION N/A 04/08/2015   Procedure: LAPAROSCOPIC  RESECTION OF PART OF  SIGMOID COLON;  Surgeon: Michael Boston, MD;  Location: [facility];  Service: General;  Laterality: N/A;  . DIAGNOSTIC LAPAROSCOPY     Endometriosis  . LAPAROSCOPIC SIGMOID COLECTOMY  04/08/2015   for colorectal cancer  . LIVER BIOPSY N/A 04/08/2015   Procedure: CORE NEEDLE LIVER BIOPSY;  Surgeon: Michael Boston, MD;  Location: [facility];  Service: General;  Laterality: N/A;  . MYOMECTOMY     Gyn in [place]  . PORTACATH PLACEMENT N/A 04/08/2015   Procedure: INSERTION PORT-A-CATH;  Surgeon: Michael Boston, MD;  Location: [facility];  Service: General;  Laterality: N/A;    SOCIAL HISTORY: Social History   Social History  . Marital status: Married    Spouse name: N/A  . Number of children: N/A  . Years of education: N/A   Occupational History  . Internal Medicine doctor     Works in [place] Topics  . Smoking status: Never Smoker  . Smokeless tobacco: Never Used  . Alcohol use Yes     Comment: socail drinker   . Drug use: No  . Sexual activity: Not on file   Other Topics Concern  . Not on file   Social History Narrative   Married, husband [name] (married X 2 years)   No children   IM Physician in [place]: Family History  Problem Relation Age of Onset  . Anesthesia problems Cousin 73    maternal cousin, colon cancer   . Clotting disorder Maternal Grandmother     ALLERGIES:  is allergic to lactose intolerance (gi); milk-related  compounds; and nsaids.  MEDICATIONS:  Current Outpatient Prescriptions  Medication Sig Dispense Refill  . cyclobenzaprine (FLEXERIL) 10 MG tablet Take 1 tablet (10 mg total) by mouth 2 (two) times daily as needed for muscle spasms. 10 tablet 0  . dexamethasone (DECADRON) 4 MG tablet Take 1 tablet (4 mg total) by mouth 2 (two) times daily with a meal. 10 tablet 1  . diclofenac (VOLTAREN) 50 MG EC tablet Take 50 mg by mouth daily.    Marland Kitchen ibuprofen (ADVIL,MOTRIN) 200 MG tablet Take 200 mg by mouth every 6 (six) hours as needed for moderate pain.    Marland Kitchen lidocaine-prilocaine (EMLA) cream Apply 1 application topically as needed. 30 g 6  . LORazepam (ATIVAN) 0.5 MG tablet Take 1 tablet (0.5 mg total) by mouth every 8 (eight) hours. Take 0.5 mg po every 8  hours as needed for nausea/vomiting. (Patient not taking: Reported on 03/17/2016) 30 tablet 0  . NIFEdipine (PROCARDIA-XL/ADALAT CC) 30 MG 24 hr tablet Take 30 mg by mouth daily.    . ondansetron (ZOFRAN) 4 MG tablet Take 1 tablet (4 mg total) by mouth every 6 (six) hours as needed for nausea. 30 tablet 2  . oxyCODONE (OXY IR/ROXICODONE) 5 MG immediate release tablet Take 1 tablet (5 mg total) by mouth every 6 (six) hours as needed for moderate pain. 60 tablet 0  . potassium chloride SA (K-DUR,KLOR-CON) 20 MEQ tablet Take 1 tablet (20 mEq total) by mouth 2 (two) times daily. 30 tablet 1  . prochlorperazine (COMPAZINE) 10 MG tablet Take 1 tablet (10 mg total) by mouth every 6 (six) hours as needed for nausea or vomiting. 30 tablet 1   No current facility-administered medications for this visit.     REVIEW OF SYSTEMS:   Constitutional: Denies fevers, chills or abnormal night sweats, (+) fatigue Eyes: Denies blurriness of vision, double vision or watery eyes Ears, nose, mouth, throat, and face: Denies mucositis or sore throat Respiratory: Denies cough, dyspnea or wheezes Cardiovascular: Denies palpitation, chest discomfort or lower extremity  swelling Gastrointestinal:  (+) nausea, (+) RUQ and rectal pain, and mild constipation Skin: Denies abnormal skin rashes Lymphatics: Denies new lymphadenopathy or easy bruising Neurological:Denies numbness, tingling or new weaknesses, (+) intermittent right flank pain Behavioral/Psych: Mood is stable, no new changes  All other systems were reviewed with the patient and are negative.  PHYSICAL EXAMINATION: ECOG PERFORMANCE STATUS: 1 BP (!) 144/84 (BP Location: Left Arm, Patient Position: Sitting)   Pulse 84   Temp 98.4 F (36.9 C) (Oral)   Resp 18   Ht 5' 6"  (1.676 m)   Wt 177 lb 9.6 oz (80.6 kg)   SpO2 98%   BMI 28.67 kg/m   GENERAL:alert, no distress and comfortable SKIN: skin color, texture, turgor are normal, no rashes or significant lesions EYES: normal, conjunctiva are pink and non-injected, sclera clear OROPHARYNX:no exudate, no erythema and lips, buccal mucosa, and tongue normal  NECK: supple, thyroid normal size, non-tender, without nodularity LYMPH:  no palpable lymphadenopathy in the cervical, axillary or inguinal LUNGS: clear to auscultation and percussion with normal breathing effort HEART: regular rate & rhythm and no murmurs and no lower extremity edema ABDOMEN:abdomen soft, non-tender and normal bowel sounds.  There is a small subcutaneous nodule in the epigastric area, slightly tender. Musculoskeletal:no cyanosis of digits and no clubbing  PSYCH: alert & oriented x 3 with fluent speech NEURO: no focal motor/sensory deficitsall  LABORATORY DATA:  I have reviewed the data as listed CBC Latest Ref Rng & Units 04/29/2016 04/15/2016 04/01/2016  WBC 3.9 - 10.3 10e3/uL 9.7 8.4 8.7  Hemoglobin 11.6 - 15.9 g/dL 11.7 11.8 12.3  Hematocrit 34.8 - 46.6 % 39.2 39.0 39.5  Platelets 145 - 400 10e3/uL 295 304 281    CMP Latest Ref Rng & Units 04/29/2016 04/15/2016 04/01/2016  Glucose 70 - 140 mg/dl 122 111 97  BUN 7.0 - 26.0 mg/dL 9.9 9.8 10.1  Creatinine 0.6 - 1.1 mg/dL 1.0 1.0  1.0  Sodium 136 - 145 mEq/L 142 143 141  Potassium 3.5 - 5.1 mEq/L 3.7 3.3(L) 3.7  Chloride 101 - 111 mmol/L - - -  CO2 22 - 29 mEq/L 22 23 25   Calcium 8.4 - 10.4 mg/dL 9.5 9.4 10.0  Total Protein 6.4 - 8.3 g/dL 7.7 7.5 7.8  Total Bilirubin 0.20 - 1.20 mg/dL 0.50  0.48 0.43  Alkaline Phos 40 - 150 U/L 146 154(H) 154(H)  AST 5 - 34 U/L 16 15 16   ALT 0 - 55 U/L 11 10 9     CEA  06/19/2015: 457.8 08/14/2015: 442.6 12/04/2015: 293.1 03/04/2016: 132.1 04/15/2016: 140.7   Diagnosis 04/08/2015 1. Liver, needle/core biopsy, ? cancer - METASTATIC ADENOCARCINOMA. 2. Colon, segmental resection for tumor, sigmoid colon mass open end proximal - COLONIC ADENOCARCINOMA WITH MUCINOUS FEATURES EXTENDING INTO PERICOLONIC ADIPOSE TISSUE AND SUBSEROSAL CONNECTIVE TISSUE. - MARGINS NOT INVOLVED. - FIFTEEN BENIGN LYMPH NODES (0/15). 3. Colon, resection margin (donut), distal anastomic ring - BENIGN COLON. - NO EVIDENCE OF MALIGNANCY.  Microscopic Comment Specimen: Sigmoid colon with liver biopsy and anastomotic rings. Procedure: Segmental resection with liver biopsy. Tumor site: Distal sigmoid. Specimen integrity: Intact. Macroscopic intactness of mesorectum: N/A Macroscopic tumor perforation: No. Invasive tumor: Maximum size: 8 cm Histologic type(s): Colorectal adenocarcinoma with mucinous features. Histologic grade and differentiation: G2: moderately differentiated/low grade Type of polyp in which invasive carcinoma arose: No residual polyp. Microscopic extension of invasive tumor: Into pericolonic adipose tissue and subserosal connective tissue. Lymph-Vascular invasion: Present. Peri-neural invasion: Present. Tumor deposit(s) (discontinuous extramural extension): No. Resection margins: Proximal margin: Free of tumor. Distal margin: Free of tumor. Circumferential (radial) (posterior ascending, posterior descending; lateral and posterior mid-rectum; and entire lower 1/3 rectum):  N/A Mesenteric margin (sigmoid and transverse): Free of tumor. Distance closest margin (if all above margins negative): 2 cm from mesenteric margin. Treatment effect (neo-adjuvant therapy): No. Additional polyp(s): No. Non-neoplastic findings: None. Lymph nodes: number examined - 15; number positive: 0 Pathologic Staging: pT3, pN0, pM1 Ancillary studies: MMR pending. (JDP:gt, 04/10/15) 2. Mismatch Repair (MMR) Protein Immunohistochemistry (IHC) IHC Expression Result: MLH1: Preserved nuclear expression (greater 50% tumor expression) MSH2: Preserved nuclear expression (greater 50% tumor expression) MSH6: Preserved nuclear expression (greater 50% tumor expression) PMS2: Preserved nuclear expression (greater 50% tumor expression) * Internal control demonstrates intact nuclear expression Interpretation: NORMAL   FoundationOne test result   RADIOGRAPHIC STUDIES: I have personally reviewed the radiological images as listed and agreed with the findings in the report.  Ct chest, abdomen and pelvis W Contrast 01/14/2016 IMPRESSION: 1. Liver metastases are stable to mildly decreased in size. No new or progressive metastatic disease. 2. Nodular curvilinear soft tissue density in the midline ventral upper abdominal wall, stable back to 07/14/2015, presumably postsurgical scarring, although cannot exclude stable superficial tumor seeding a surgical tract. 3. No evidence of local tumor recurrence at the distal colonic anastomosis. 4. Additional findings include 1 vessel coronary atherosclerosis, stable dilated main pulmonary artery suggesting chronic pulmonary arterial hypertension, trace bilateral pleural effusions, cholelithiasis, tiny hiatal hernia and bulky myomatous uterus.  Bone scan 03/31/2016 IMPRESSION: Negative bone scan.  ASSESSMENT & PLAN:  55 year old African-American [name], with past medical history of hypertension, thalassemia trait, iron deficient anemia, uterine fibroids,  endometriosis, who presents with intermittent rectal bleeding, mild nausea, and mild intermittent left lower quadrant abdominal pain.   1. Sigmoid rectal adenocarcinoma, with liver metastases, pT3N0M1, stage IV, MMR normal, NRAS and BRAF mutation (+)  -I reviewed her surgical pathology findings with her in great details. Her liver biopsy confirmed metastatic adenocarcinoma  From colon cancer. Her primary sigmoid rectal tumor has been completely removed. -We again reviewed her CT findings, it showed at least 4 liver metastatic lesion, with the largest one measuring 9.1 cm in the left lobe. No other distant metastasis -We reviewed the natural history of metastatic colon cancer, and incurable nature at this is stage,  and palliative goal of therapy-Her tumor has BRAF mutation, which is a poor prognostic factor  -Her tumor has NRAS mutation, not a candidate for EGFR inhibitor  -Given the MSI-stable, she might not benefit from immunotherapy, except in a clinically trial setting  -I reviewed her restaging CT scan from 01/14/2016, which showed slight improvement in liver mets, no other new lesions, she has had a good partial response so far.  - her tumor marker CEA has been trending down lately, corresponding to good response to chemotherapy -I discussed her recent bone scan, which showed no evidence of bone metastasis. -Her muscle spasm and pain has resolved after she stopped omeprazole. -She is clinically doing well, tolerating treatment very well, lab reviewed, normal CBC and CMP. We'll continue chemotherapy until disease progression or intolerance. We discussed chemotherapy break, she doesn't want break for now.  -due to her prolonged neutropenia, Neulasta was added on from cycle 7. However he was denied by her insurance daily, we'll hold the Neulasta for now. We may have to reduce her chemotherapy dose if she develops neutropenia. I have called her insurance to appeal her neulasta denial.   2. Right frank  and shoulder pain -Possible muscle spasm, probably related to omeprazole, which she has stopped -resolved now    3 Iron deficient Anemia, and beta thalassemia trait  -Secondary to GI bleeding and iron deficiency -Ferritin level was 8, consistent with iron deficiency from GI bleeding -She received Feraheme 510 mg twice,  responded very well. Her anemia resolved, even she is getting chemotherapy.  -her repeated iron study on 12/18/2015 showed ferritin 317 and normal serum iron   5. Hypertension -She has no history of hypertension. Blood pressure has been slightly elevated in the past months, repeated normal. Possibly related to Avastin -I'll continue monitoring her blood pressure closely. Consider adding amlodipine if persistent high.  6. Peripheral neuropathy, G1 -Secondary to chemotherapy, which has been mild and stable, usually resolves before her next cycle chemo  -we'll watch closely. If gets worse, we'll consider dose reduction. -I recommend her to try Neurontin, she declined due to her sensitivity to sedative medication  Plan -Lab reviewed, will proceed chemo FOLFIRI and avastin today and continue every 2 weeks, no Neulasta on day 3 -I will see her back in 2 weeks for close monitoring of her neuropathy   All questions were answered. The patient knows to call the clinic with any problems, questions or concerns. I spent 20 minutes counseling the patient face to face. The total time spent in the appointment was 25 minutes and more than 50% was on counseling.     Truitt Merle, MD 04/29/2016

## 2016-04-30 ENCOUNTER — Telehealth: Payer: Self-pay | Admitting: *Deleted

## 2016-04-30 ENCOUNTER — Other Ambulatory Visit: Payer: Self-pay | Admitting: *Deleted

## 2016-04-30 NOTE — Telephone Encounter (Signed)
Erica/RN working with [employer] appeal.  They are re-opening the case.   They need clinical information faxed to them at [phone].  They need MD's last note with patient's name and DOB faxed to them    ATTN: [place] appeals. Discussed with Dr. Burr Medico.   MD @ insurance is to call her today 04/30/16.  Wait to see if MD calls and then if not, send info.

## 2016-05-01 ENCOUNTER — Ambulatory Visit: Payer: Self-pay

## 2016-05-01 ENCOUNTER — Ambulatory Visit (HOSPITAL_BASED_OUTPATIENT_CLINIC_OR_DEPARTMENT_OTHER): Payer: BLUE CROSS/BLUE SHIELD

## 2016-05-01 VITALS — BP 144/91 | HR 92 | Temp 98.7°F | Resp 16

## 2016-05-01 DIAGNOSIS — C787 Secondary malignant neoplasm of liver and intrahepatic bile duct: Secondary | ICD-10-CM | POA: Diagnosis not present

## 2016-05-01 DIAGNOSIS — Z452 Encounter for adjustment and management of vascular access device: Secondary | ICD-10-CM | POA: Diagnosis not present

## 2016-05-01 DIAGNOSIS — C187 Malignant neoplasm of sigmoid colon: Secondary | ICD-10-CM

## 2016-05-01 DIAGNOSIS — C189 Malignant neoplasm of colon, unspecified: Secondary | ICD-10-CM

## 2016-05-01 MED ORDER — SODIUM CHLORIDE 0.9 % IJ SOLN
10.0000 mL | INTRAMUSCULAR | Status: DC | PRN
  Administered 2016-05-01: 10 mL
  Filled 2016-05-01: qty 10

## 2016-05-01 MED ORDER — HEPARIN SOD (PORK) LOCK FLUSH 100 UNIT/ML IV SOLN
500.0000 [IU] | Freq: Once | INTRAVENOUS | Status: AC | PRN
  Administered 2016-05-01: 500 [IU]
  Filled 2016-05-01: qty 5

## 2016-05-02 ENCOUNTER — Encounter: Payer: Self-pay | Admitting: Hematology

## 2016-05-03 ENCOUNTER — Telehealth: Payer: Self-pay | Admitting: Medical Oncology

## 2016-05-03 NOTE — Telephone Encounter (Signed)
Diane, could you send my last office note to the number in the following message? Thanks  Truitt Merle 05/03/2016

## 2016-05-03 NOTE — Telephone Encounter (Signed)
Faxed last ov to insurance.

## 2016-05-06 ENCOUNTER — Telehealth: Payer: Self-pay | Admitting: *Deleted

## 2016-05-06 NOTE — Telephone Encounter (Signed)
Received call from [place] regarding appeal for neulasta stating that documentation not received & appeal closed. Called back [phone] & talked with Danae Chen & informed that documentation was faxed on 05/03/16 with confirmation.  She wanted date, time & # of pages & fax # that it was sent to.  This info given per confirmation sheet 05/03/16 @13 :49, 12 pages to [phone].  She will f/u to find fax & get back with [place] if case needs to be reopened.

## 2016-05-07 ENCOUNTER — Telehealth: Payer: Self-pay | Admitting: *Deleted

## 2016-05-07 NOTE — Telephone Encounter (Signed)
Leonia Reader nurse with [employer] Called and left message.  They did locate the fax and the information.  Their oncologist reviewed and the denial for the neulasta still remains.  Will let Dr. Burr Medico know.

## 2016-05-11 ENCOUNTER — Telehealth: Payer: Self-pay | Admitting: *Deleted

## 2016-05-11 NOTE — Telephone Encounter (Signed)
Received call from pt with concerns of coming late for appt on thurs for lab/chemo.  She states that she got bolused last time & this made her sick.  She wants to come earlier.  Message to Michelle/Scheduler to call pt @ [phone].

## 2016-05-11 NOTE — Telephone Encounter (Signed)
Per patient request I have moved appts to an earlier time. Patient called and aware

## 2016-05-13 ENCOUNTER — Other Ambulatory Visit: Payer: Self-pay | Admitting: *Deleted

## 2016-05-13 ENCOUNTER — Other Ambulatory Visit (HOSPITAL_BASED_OUTPATIENT_CLINIC_OR_DEPARTMENT_OTHER): Payer: BLUE CROSS/BLUE SHIELD

## 2016-05-13 ENCOUNTER — Ambulatory Visit (HOSPITAL_BASED_OUTPATIENT_CLINIC_OR_DEPARTMENT_OTHER): Payer: BLUE CROSS/BLUE SHIELD

## 2016-05-13 ENCOUNTER — Other Ambulatory Visit: Payer: Self-pay | Admitting: Hematology

## 2016-05-13 VITALS — BP 139/87 | HR 96 | Temp 98.6°F | Resp 16 | Wt 176.8 lb

## 2016-05-13 DIAGNOSIS — Z5112 Encounter for antineoplastic immunotherapy: Secondary | ICD-10-CM

## 2016-05-13 DIAGNOSIS — C187 Malignant neoplasm of sigmoid colon: Secondary | ICD-10-CM

## 2016-05-13 DIAGNOSIS — C189 Malignant neoplasm of colon, unspecified: Secondary | ICD-10-CM

## 2016-05-13 DIAGNOSIS — Z5111 Encounter for antineoplastic chemotherapy: Secondary | ICD-10-CM | POA: Diagnosis not present

## 2016-05-13 DIAGNOSIS — C787 Secondary malignant neoplasm of liver and intrahepatic bile duct: Secondary | ICD-10-CM

## 2016-05-13 LAB — COMPREHENSIVE METABOLIC PANEL
ANION GAP: 11 meq/L (ref 3–11)
AST: 15 U/L (ref 5–34)
Albumin: 3.7 g/dL (ref 3.5–5.0)
Alkaline Phosphatase: 107 U/L (ref 40–150)
BILIRUBIN TOTAL: 0.47 mg/dL (ref 0.20–1.20)
BUN: 9.1 mg/dL (ref 7.0–26.0)
CO2: 23 meq/L (ref 22–29)
CREATININE: 1 mg/dL (ref 0.6–1.1)
Calcium: 9.6 mg/dL (ref 8.4–10.4)
Chloride: 111 mEq/L — ABNORMAL HIGH (ref 98–109)
EGFR: 73 mL/min/{1.73_m2} — ABNORMAL LOW (ref >90)
GLUCOSE: 97 mg/dL (ref 70–140)
Potassium: 3.3 mEq/L — ABNORMAL LOW (ref 3.5–5.1)
Sodium: 145 mEq/L (ref 136–145)
TOTAL PROTEIN: 7.4 g/dL (ref 6.4–8.3)

## 2016-05-13 LAB — CBC WITH DIFFERENTIAL/PLATELET
BASO%: 0.5 % (ref 0.0–2.0)
Basophils Absolute: 0 10*3/uL (ref 0.0–0.1)
EOS%: 3.2 % (ref 0.0–7.0)
Eosinophils Absolute: 0.1 10*3/uL (ref 0.0–0.5)
HCT: 39.1 % (ref 34.8–46.6)
HGB: 11.7 g/dL (ref 11.6–15.9)
LYMPH%: 23.7 % (ref 14.0–49.7)
MCH: 22.9 pg — ABNORMAL LOW (ref 25.1–34.0)
MCHC: 29.9 g/dL — ABNORMAL LOW (ref 31.5–36.0)
MCV: 76.4 fL — ABNORMAL LOW (ref 79.5–101.0)
MONO#: 0.4 10*3/uL (ref 0.1–0.9)
MONO%: 11 % (ref 0.0–14.0)
NEUT#: 2.3 10*3/uL (ref 1.5–6.5)
NEUT%: 61.6 % (ref 38.4–76.8)
Platelets: 356 10*3/uL (ref 145–400)
RBC: 5.12 10*6/uL (ref 3.70–5.45)
RDW: 19 % — ABNORMAL HIGH (ref 11.2–14.5)
WBC: 3.7 10*3/uL — ABNORMAL LOW (ref 3.9–10.3)
lymph#: 0.9 10*3/uL (ref 0.9–3.3)

## 2016-05-13 MED ORDER — SODIUM CHLORIDE 0.9 % IV SOLN
Freq: Once | INTRAVENOUS | Status: AC
  Administered 2016-05-13: 10:00:00 via INTRAVENOUS

## 2016-05-13 MED ORDER — OXYCODONE HCL 5 MG PO TABS: 5.0000 mg | ORAL_TABLET | Freq: Four times a day (QID) | ORAL | 0 refills | Status: DC | PRN

## 2016-05-13 MED ORDER — PALONOSETRON HCL INJECTION 0.25 MG/5ML
INTRAVENOUS | Status: AC
  Filled 2016-05-13: qty 5

## 2016-05-13 MED ORDER — SODIUM CHLORIDE 0.9 % IV SOLN
2400.0000 mg/m2 | INTRAVENOUS | Status: DC
  Administered 2016-05-13: 4400 mg via INTRAVENOUS
  Filled 2016-05-13: qty 88

## 2016-05-13 MED ORDER — PALONOSETRON HCL INJECTION 0.25 MG/5ML
0.2500 mg | Freq: Once | INTRAVENOUS | Status: AC
  Administered 2016-05-13: 0.25 mg via INTRAVENOUS

## 2016-05-13 MED ORDER — IRINOTECAN HCL CHEMO INJECTION 100 MG/5ML
180.0000 mg/m2 | Freq: Once | INTRAVENOUS | Status: AC
  Administered 2016-05-13: 320 mg via INTRAVENOUS
  Filled 2016-05-13: qty 10

## 2016-05-13 MED ORDER — LEUCOVORIN CALCIUM INJECTION 350 MG
400.0000 mg/m2 | Freq: Once | INTRAVENOUS | Status: AC
  Administered 2016-05-13: 732 mg via INTRAVENOUS
  Filled 2016-05-13: qty 36.6

## 2016-05-13 MED ORDER — SODIUM CHLORIDE 0.9 % IV SOLN
5.5000 mg/kg | Freq: Once | INTRAVENOUS | Status: AC
  Administered 2016-05-13: 400 mg via INTRAVENOUS
  Filled 2016-05-13: qty 16

## 2016-05-13 MED ORDER — SODIUM CHLORIDE 0.9 % IV SOLN
Freq: Once | INTRAVENOUS | Status: AC
  Administered 2016-05-13: 11:00:00 via INTRAVENOUS
  Filled 2016-05-13: qty 5

## 2016-05-13 MED ORDER — ATROPINE SULFATE 1 MG/ML IJ SOLN
INTRAMUSCULAR | Status: AC
  Filled 2016-05-13: qty 1

## 2016-05-13 MED ORDER — ATROPINE SULFATE 1 MG/ML IJ SOLN
0.5000 mg | Freq: Once | INTRAMUSCULAR | Status: AC | PRN
  Administered 2016-05-13: 0.5 mg via INTRAVENOUS

## 2016-05-13 NOTE — Patient Instructions (Signed)
[facility] Discharge Instructions for Patients Receiving Chemotherapy  Today you received the following chemotherapy agents  Irinotecan. Leucovorin and 5 fu To help prevent nausea and vomiting after your treatment, we encourage you to take your nausea medication as prescribed.  If you develop nausea and vomiting that is not controlled by your nausea medication, call the clinic.   BELOW ARE SYMPTOMS THAT SHOULD BE REPORTED IMMEDIATELY:  *FEVER GREATER THAN 100.5 F  *CHILLS WITH OR WITHOUT FEVER  NAUSEA AND VOMITING THAT IS NOT CONTROLLED WITH YOUR NAUSEA MEDICATION  *UNUSUAL SHORTNESS OF BREATH  *UNUSUAL BRUISING OR BLEEDING  TENDERNESS IN MOUTH AND THROAT WITH OR WITHOUT PRESENCE OF ULCERS  *URINARY PROBLEMS  *BOWEL PROBLEMS  UNUSUAL RASH Items with * indicate a potential emergency and should be followed up as soon as possible.  Feel free to call the clinic you have any questions or concerns. The clinic phone number is [phone].  Please show the [place] at check-in to the Emergency Department and triage nurse.  Bevacizumab injection What is this medicine? BEVACIZUMAB (be va SIZ yoo mab) is a monoclonal antibody. It is used to treat cervical cancer, colorectal cancer, glioblastoma multiforme, non-small cell lung cancer (NSCLC), ovarian cancer, and renal cell cancer. This medicine may be used for other purposes; ask your health care provider or pharmacist if you have questions. What should I tell my health care provider before I take this medicine? They need to know if you have any of these conditions: -blood clots -heart disease, including heart failure, heart attack, or chest pain (angina) -high blood pressure -infection (especially a virus infection such as chickenpox, cold sores, or herpes) -kidney disease -lung disease -prior chemotherapy with doxorubicin, daunorubicin, epirubicin, or other anthracycline type chemotherapy agents -recent or  ongoing radiation therapy -recent surgery -stroke -an unusual or allergic reaction to bevacizumab, hamster proteins, mouse proteins, other medicines, foods, dyes, or preservatives -pregnant or trying to get pregnant -breast-feeding How should I use this medicine? This medicine is for infusion into a vein. It is given by a health care professional in a hospital or clinic setting. Talk to your pediatrician regarding the use of this medicine in children. Special care may be needed. Overdosage: If you think you have taken too much of this medicine contact a poison control center or emergency room at once. NOTE: This medicine is only for you. Do not share this medicine with others. What if I miss a dose? It is important not to miss your dose. Call your doctor or health care professional if you are unable to keep an appointment. What may interact with this medicine? Interactions are not expected. This list may not describe all possible interactions. Give your health care provider a list of all the medicines, herbs, non-prescription drugs, or dietary supplements you use. Also tell them if you smoke, drink alcohol, or use illegal drugs. Some items may interact with your medicine. What should I watch for while using this medicine? Your condition will be monitored carefully while you are receiving this medicine. You will need important blood work and urine testing done while you are taking this medicine. During your treatment, let your health care professional know if you have any unusual symptoms, such as difficulty breathing. This medicine may rarely cause 'gastrointestinal perforation' (holes in the stomach, intestines or colon), a serious side effect requiring surgery to repair. This medicine should be started at least 28 days following major surgery and the site of the surgery should  be totally healed. Check with your doctor before scheduling dental work or surgery while you are receiving this  treatment. Talk to your doctor if you have recently had surgery or if you have a wound that has not healed. Do not become pregnant while taking this medicine or for 6 months after stopping it. Women should inform their doctor if they wish to become pregnant or think they might be pregnant. There is a potential for serious side effects to an unborn child. Talk to your health care professional or pharmacist for more information. Do not breast-feed an infant while taking this medicine. This medicine has caused ovarian failure in some women. This medicine may interfere with the ability to have a child. You should talk to your doctor or health care professional if you are concerned about your fertility. What side effects may I notice from receiving this medicine? Side effects that you should report to your doctor or health care professional as soon as possible: -allergic reactions like skin rash, itching or hives, swelling of the face, lips, or tongue -signs of infection - fever or chills, cough, sore throat, pain or trouble passing urine -signs of decreased platelets or bleeding - bruising, pinpoint red spots on the skin, black, tarry stools, nosebleeds, blood in the urine -breathing problems -changes in vision -chest pain -confusion -jaw pain, especially after dental work -mouth sores -seizures -severe abdominal pain -severe headache -sudden numbness or weakness of the face, arm or leg -swelling of legs or ankles -symptoms of a stroke: change in mental awareness, inability to talk or move one side of the body (especially in patients with lung cancer) -trouble passing urine or change in the amount of urine -trouble speaking or understanding -trouble walking, dizziness, loss of balance or coordination Side effects that usually do not require medical attention (report to your doctor or health care professional if they continue or are bothersome): -constipation -diarrhea -dry  skin -headache -loss of appetite -nausea, vomiting This list may not describe all possible side effects. Call your doctor for medical advice about side effects. You may report side effects to FDA at 1-800-FDA-1088. Where should I keep my medicine? This drug is given in a hospital or clinic and will not be stored at home. NOTE: This sheet is a summary. It may not cover all possible information. If you have questions about this medicine, talk to your doctor, pharmacist, or health care provider.    2016, [employer]vier/[employer] Standard. (2014-09-24 16:58:44)

## 2016-05-15 ENCOUNTER — Ambulatory Visit (HOSPITAL_BASED_OUTPATIENT_CLINIC_OR_DEPARTMENT_OTHER): Payer: BLUE CROSS/BLUE SHIELD

## 2016-05-15 VITALS — BP 135/88 | HR 78 | Temp 98.4°F | Resp 18

## 2016-05-15 DIAGNOSIS — C187 Malignant neoplasm of sigmoid colon: Secondary | ICD-10-CM

## 2016-05-15 DIAGNOSIS — Z452 Encounter for adjustment and management of vascular access device: Secondary | ICD-10-CM | POA: Diagnosis not present

## 2016-05-15 DIAGNOSIS — C787 Secondary malignant neoplasm of liver and intrahepatic bile duct: Principal | ICD-10-CM

## 2016-05-15 DIAGNOSIS — C189 Malignant neoplasm of colon, unspecified: Secondary | ICD-10-CM

## 2016-05-15 MED ORDER — SODIUM CHLORIDE 0.9 % IJ SOLN
10.0000 mL | INTRAMUSCULAR | Status: DC | PRN
  Administered 2016-05-15: 10 mL
  Filled 2016-05-15: qty 10

## 2016-05-15 MED ORDER — HEPARIN SOD (PORK) LOCK FLUSH 100 UNIT/ML IV SOLN
500.0000 [IU] | Freq: Once | INTRAVENOUS | Status: AC | PRN
  Administered 2016-05-15: 500 [IU]
  Filled 2016-05-15: qty 5

## 2016-05-15 NOTE — Patient Instructions (Signed)
Fluorouracil, 5-FU injection What is this medicine? FLUOROURACIL, 5-FU (flure oh YOOR a sil) is a chemotherapy drug. It slows the growth of cancer cells. This medicine is used to treat many types of cancer like breast cancer, colon or rectal cancer, pancreatic cancer, and stomach cancer. This medicine may be used for other purposes; ask your health care provider or pharmacist if you have questions. What should I tell my health care provider before I take this medicine? They need to know if you have any of these conditions: -blood disorders -dihydropyrimidine dehydrogenase (DPD) deficiency -infection (especially a virus infection such as chickenpox, cold sores, or herpes) -kidney disease -liver disease -malnourished, poor nutrition -recent or ongoing radiation therapy -an unusual or allergic reaction to fluorouracil, other chemotherapy, other medicines, foods, dyes, or preservatives -pregnant or trying to get pregnant -breast-feeding How should I use this medicine? This drug is given as an infusion or injection into a vein. It is administered in a hospital or clinic by a specially trained health care professional. Talk to your pediatrician regarding the use of this medicine in children. Special care may be needed. Overdosage: If you think you have taken too much of this medicine contact a poison control center or emergency room at once. NOTE: This medicine is only for you. Do not share this medicine with others. What if I miss a dose? It is important not to miss your dose. Call your doctor or health care professional if you are unable to keep an appointment. What may interact with this medicine? -allopurinol -cimetidine -dapsone -digoxin -hydroxyurea -leucovorin -levamisole -medicines for seizures like ethotoin, fosphenytoin, phenytoin -medicines to increase blood counts like filgrastim, pegfilgrastim, sargramostim -medicines that treat or prevent blood clots like warfarin,  enoxaparin, and dalteparin -methotrexate -metronidazole -pyrimethamine -some other chemotherapy drugs like busulfan, cisplatin, estramustine, vinblastine -trimethoprim -trimetrexate -vaccines Talk to your doctor or health care professional before taking any of these medicines: -acetaminophen -aspirin -ibuprofen -ketoprofen -naproxen This list may not describe all possible interactions. Give your health care provider a list of all the medicines, herbs, non-prescription drugs, or dietary supplements you use. Also tell them if you smoke, drink alcohol, or use illegal drugs. Some items may interact with your medicine. What should I watch for while using this medicine? Visit your doctor for checks on your progress. This drug may make you feel generally unwell. This is not uncommon, as chemotherapy can affect healthy cells as well as cancer cells. Report any side effects. Continue your course of treatment even though you feel ill unless your doctor tells you to stop. In some cases, you may be given additional medicines to help with side effects. Follow all directions for their use. Call your doctor or health care professional for advice if you get a fever, chills or sore throat, or other symptoms of a cold or flu. Do not treat yourself. This drug decreases your body's ability to fight infections. Try to avoid being around people who are sick. This medicine may increase your risk to bruise or bleed. Call your doctor or health care professional if you notice any unusual bleeding. Be careful brushing and flossing your teeth or using a toothpick because you may get an infection or bleed more easily. If you have any dental work done, tell your dentist you are receiving this medicine. Avoid taking products that contain aspirin, acetaminophen, ibuprofen, naproxen, or ketoprofen unless instructed by your doctor. These medicines may hide a fever. Do not become pregnant while taking this medicine. Women should    inform their doctor if they wish to become pregnant or think they might be pregnant. There is a potential for serious side effects to an unborn child. Talk to your health care professional or pharmacist for more information. Do not breast-feed an infant while taking this medicine. Men should inform their doctor if they wish to father a child. This medicine may lower sperm counts. Do not treat diarrhea with over the counter products. Contact your doctor if you have diarrhea that lasts more than 2 days or if it is severe and watery. This medicine can make you more sensitive to the sun. Keep out of the sun. If you cannot avoid being in the sun, wear protective clothing and use sunscreen. Do not use sun lamps or tanning beds/booths. What side effects may I notice from receiving this medicine? Side effects that you should report to your doctor or health care professional as soon as possible: -allergic reactions like skin rash, itching or hives, swelling of the face, lips, or tongue -low blood counts - this medicine may decrease the number of white blood cells, red blood cells and platelets. You may be at increased risk for infections and bleeding. -signs of infection - fever or chills, cough, sore throat, pain or difficulty passing urine -signs of decreased platelets or bleeding - bruising, pinpoint red spots on the skin, black, tarry stools, blood in the urine -signs of decreased red blood cells - unusually weak or tired, fainting spells, lightheadedness -breathing problems -changes in vision -chest pain -mouth sores -nausea and vomiting -pain, swelling, redness at site where injected -pain, tingling, numbness in the hands or feet -redness, swelling, or sores on hands or feet -stomach pain -unusual bleeding Side effects that usually do not require medical attention (report to your doctor or health care professional if they continue or are bothersome): -changes in finger or toe  nails -diarrhea -dry or itchy skin -hair loss -headache -loss of appetite -sensitivity of eyes to the light -stomach upset -unusually teary eyes This list may not describe all possible side effects. Call your doctor for medical advice about side effects. You may report side effects to FDA at 1-800-FDA-1088. Where should I keep my medicine? This drug is given in a hospital or clinic and will not be stored at home. NOTE: This sheet is a summary. It may not cover all possible information. If you have questions about this medicine, talk to your doctor, pharmacist, or health care provider.    2016, [employer]. (2007-11-29 13:53:16)  

## 2016-05-26 ENCOUNTER — Other Ambulatory Visit: Payer: Self-pay | Admitting: Hematology

## 2016-05-27 ENCOUNTER — Ambulatory Visit (HOSPITAL_BASED_OUTPATIENT_CLINIC_OR_DEPARTMENT_OTHER): Payer: BLUE CROSS/BLUE SHIELD

## 2016-05-27 ENCOUNTER — Ambulatory Visit (HOSPITAL_BASED_OUTPATIENT_CLINIC_OR_DEPARTMENT_OTHER): Payer: BLUE CROSS/BLUE SHIELD | Admitting: Hematology

## 2016-05-27 ENCOUNTER — Encounter: Payer: Self-pay | Admitting: Hematology

## 2016-05-27 ENCOUNTER — Other Ambulatory Visit (HOSPITAL_BASED_OUTPATIENT_CLINIC_OR_DEPARTMENT_OTHER): Payer: BLUE CROSS/BLUE SHIELD

## 2016-05-27 VITALS — BP 133/79 | HR 89

## 2016-05-27 VITALS — BP 148/85 | HR 96 | Temp 98.2°F | Resp 18 | Ht 66.0 in | Wt 180.7 lb

## 2016-05-27 DIAGNOSIS — C189 Malignant neoplasm of colon, unspecified: Secondary | ICD-10-CM

## 2016-05-27 DIAGNOSIS — C787 Secondary malignant neoplasm of liver and intrahepatic bile duct: Secondary | ICD-10-CM

## 2016-05-27 DIAGNOSIS — I10 Essential (primary) hypertension: Secondary | ICD-10-CM

## 2016-05-27 DIAGNOSIS — Z452 Encounter for adjustment and management of vascular access device: Secondary | ICD-10-CM

## 2016-05-27 DIAGNOSIS — C187 Malignant neoplasm of sigmoid colon: Secondary | ICD-10-CM | POA: Diagnosis not present

## 2016-05-27 DIAGNOSIS — D500 Iron deficiency anemia secondary to blood loss (chronic): Secondary | ICD-10-CM

## 2016-05-27 DIAGNOSIS — D561 Beta thalassemia: Secondary | ICD-10-CM | POA: Diagnosis not present

## 2016-05-27 DIAGNOSIS — Z5112 Encounter for antineoplastic immunotherapy: Secondary | ICD-10-CM | POA: Diagnosis not present

## 2016-05-27 DIAGNOSIS — G620 Drug-induced polyneuropathy: Secondary | ICD-10-CM

## 2016-05-27 DIAGNOSIS — Z95828 Presence of other vascular implants and grafts: Secondary | ICD-10-CM

## 2016-05-27 DIAGNOSIS — Z5111 Encounter for antineoplastic chemotherapy: Secondary | ICD-10-CM | POA: Diagnosis not present

## 2016-05-27 LAB — CBC WITH DIFFERENTIAL/PLATELET
BASO%: 0.8 % (ref 0.0–2.0)
BASOS ABS: 0 10*3/uL (ref 0.0–0.1)
EOS ABS: 0.1 10*3/uL (ref 0.0–0.5)
EOS%: 3.3 % (ref 0.0–7.0)
HEMATOCRIT: 39.9 % (ref 34.8–46.6)
HGB: 12 g/dL (ref 11.6–15.9)
LYMPH#: 1.1 10*3/uL (ref 0.9–3.3)
LYMPH%: 24.7 % (ref 14.0–49.7)
MCH: 22.5 pg — AB (ref 25.1–34.0)
MCHC: 30.2 g/dL — AB (ref 31.5–36.0)
MCV: 74.5 fL — AB (ref 79.5–101.0)
MONO#: 0.6 10*3/uL (ref 0.1–0.9)
MONO%: 13.7 % (ref 0.0–14.0)
NEUT#: 2.5 10*3/uL (ref 1.5–6.5)
NEUT%: 57.5 % (ref 38.4–76.8)
PLATELETS: 309 10*3/uL (ref 145–400)
RBC: 5.35 10*6/uL (ref 3.70–5.45)
RDW: 19.5 % — ABNORMAL HIGH (ref 11.2–14.5)
WBC: 4.3 10*3/uL (ref 3.9–10.3)

## 2016-05-27 LAB — UA PROTEIN, DIPSTICK - CHCC: PROTEIN: 30 mg/dL

## 2016-05-27 LAB — COMPREHENSIVE METABOLIC PANEL
ALBUMIN: 3.7 g/dL (ref 3.5–5.0)
ALK PHOS: 95 U/L (ref 40–150)
ALT: 6 U/L (ref 0–55)
ANION GAP: 11 meq/L (ref 3–11)
AST: 15 U/L (ref 5–34)
BUN: 9.2 mg/dL (ref 7.0–26.0)
CALCIUM: 9.4 mg/dL (ref 8.4–10.4)
CHLORIDE: 109 meq/L (ref 98–109)
CO2: 23 mEq/L (ref 22–29)
Creatinine: 1 mg/dL (ref 0.6–1.1)
EGFR: 74 mL/min/{1.73_m2} — AB (ref >90)
Glucose: 105 mg/dl (ref 70–140)
POTASSIUM: 3.5 meq/L (ref 3.5–5.1)
Sodium: 143 mEq/L (ref 136–145)
Total Bilirubin: 0.49 mg/dL (ref 0.20–1.20)
Total Protein: 7.2 g/dL (ref 6.4–8.3)

## 2016-05-27 LAB — CEA (IN HOUSE-CHCC): CEA (CHCC-IN HOUSE): 122.56 ng/mL — AB (ref 0.00–5.00)

## 2016-05-27 MED ORDER — PALONOSETRON HCL INJECTION 0.25 MG/5ML
0.2500 mg | Freq: Once | INTRAVENOUS | Status: AC
  Administered 2016-05-27: 0.25 mg via INTRAVENOUS

## 2016-05-27 MED ORDER — ATROPINE SULFATE 1 MG/ML IJ SOLN
INTRAMUSCULAR | Status: AC
  Filled 2016-05-27: qty 1

## 2016-05-27 MED ORDER — ATROPINE SULFATE 1 MG/ML IJ SOLN
0.5000 mg | Freq: Once | INTRAMUSCULAR | Status: AC | PRN
  Administered 2016-05-27: 0.5 mg via INTRAVENOUS

## 2016-05-27 MED ORDER — SODIUM CHLORIDE 0.9 % IV SOLN
Freq: Once | INTRAVENOUS | Status: AC
  Administered 2016-05-27: 10:00:00 via INTRAVENOUS

## 2016-05-27 MED ORDER — FOSAPREPITANT DIMEGLUMINE INJECTION 150 MG
Freq: Once | INTRAVENOUS | Status: AC
  Administered 2016-05-27: 10:00:00 via INTRAVENOUS
  Filled 2016-05-27: qty 5

## 2016-05-27 MED ORDER — IRINOTECAN HCL CHEMO INJECTION 100 MG/5ML
180.0000 mg/m2 | Freq: Once | INTRAVENOUS | Status: AC
  Administered 2016-05-27: 320 mg via INTRAVENOUS
  Filled 2016-05-27: qty 6

## 2016-05-27 MED ORDER — BEVACIZUMAB CHEMO INJECTION 400 MG/16ML
5.5000 mg/kg | Freq: Once | INTRAVENOUS | Status: AC
  Administered 2016-05-27: 400 mg via INTRAVENOUS
  Filled 2016-05-27: qty 16

## 2016-05-27 MED ORDER — SODIUM CHLORIDE 0.9 % IJ SOLN
10.0000 mL | INTRAMUSCULAR | Status: DC | PRN
  Administered 2016-05-27: 10 mL via INTRAVENOUS
  Filled 2016-05-27: qty 10

## 2016-05-27 MED ORDER — LEUCOVORIN CALCIUM INJECTION 350 MG
400.0000 mg/m2 | Freq: Once | INTRAVENOUS | Status: AC
  Administered 2016-05-27: 732 mg via INTRAVENOUS
  Filled 2016-05-27: qty 36.6

## 2016-05-27 MED ORDER — PALONOSETRON HCL INJECTION 0.25 MG/5ML
INTRAVENOUS | Status: AC
  Filled 2016-05-27: qty 5

## 2016-05-27 MED ORDER — SODIUM CHLORIDE 0.9 % IV SOLN
2400.0000 mg/m2 | INTRAVENOUS | Status: DC
  Administered 2016-05-27: 4400 mg via INTRAVENOUS
  Filled 2016-05-27: qty 88

## 2016-05-27 NOTE — Patient Instructions (Signed)
[facility] Discharge Instructions for Patients Receiving Chemotherapy  Today you received the following chemotherapy agents: Irinotecan, Leucovorin and Adrucil   To help prevent nausea and vomiting after your treatment, we encourage you to take your nausea medication as directed. No Zofran for 3 days. Take Compazine instead.   If you develop nausea and vomiting that is not controlled by your nausea medication, call the clinic.   BELOW ARE SYMPTOMS THAT SHOULD BE REPORTED IMMEDIATELY:  *FEVER GREATER THAN 100.5 F  *CHILLS WITH OR WITHOUT FEVER  NAUSEA AND VOMITING THAT IS NOT CONTROLLED WITH YOUR NAUSEA MEDICATION  *UNUSUAL SHORTNESS OF BREATH  *UNUSUAL BRUISING OR BLEEDING  TENDERNESS IN MOUTH AND THROAT WITH OR WITHOUT PRESENCE OF ULCERS  *URINARY PROBLEMS  *BOWEL PROBLEMS  UNUSUAL RASH Items with * indicate a potential emergency and should be followed up as soon as possible.  Feel free to call the clinic you have any questions or concerns. The clinic phone number is [phone].  Please show the CHEMO ALERT CARD at check-in to the Emergency Department and triage nurse.   

## 2016-05-27 NOTE — Progress Notes (Signed)
[place]  [phone] [phone]) [phone]  Clinic follow up Note   Patient Care Team: Benito Mccreedy, MD as PCP - General (Internal Medicine) Juanita Craver, MD as Consulting Physician (Gastroenterology) Michael Boston, MD as Consulting Physician (General Surgery) Truitt Merle, MD as Consulting Physician (Oncology) 05/27/2016   CHIEF COMPLAINTS:  Follow up metastatic colon cancer      Oncology History   Metastatic colon cancer to liver   Staging form: Colon and Rectum, AJCC 7th Edition     Pathologic stage from 04/08/2015: T3, N0, M1 - Signed by Truitt Merle, MD on 04/22/2015  Presented to ER with intractable N/V; intermittent rectal bleeding X 3 months      Metastatic colon cancer to liver ([place])   04/03/2015 Procedure    Colonoscopy showed a obstructing sigmoid rectal mass. Biopsy showed adenocarcinoma.      04/06/2015 Tumor Marker    CEA=467.3 / CA19.9=1605      04/06/2015 Imaging    CT chest, abdomen and pelvis with contrast showed sigmoid colon rectal mass, multiple (4) liver metastasis, with the largest 9.1 x 6.1 cm mass in the left lobe..      04/08/2015 Miscellaneous    Foundation one genomic testing showed NRAS G60e and BRAF [id number] mutations      04/08/2015 Initial Diagnosis    Metastatic colon cancer to liver      04/08/2015 Surgery    Laparoscopic sigmoid colectomy, liver biopsy, port cath insertion, by Dr. Johney Maine       04/08/2015 Pathology Results    Sigmoid colon segmental resection showed adenocarcinoma with mucinous features, pT 3, 15 lymph nodes all negative, surgical margins were negative. Liver biopsy showed metastatic adenocarcinoma.      05/08/2015 -  Chemotherapy    FOLFIRI every 2 weeks, Avastin was added on from second cycle       07/14/2015 Imaging    CT abdomen and pelvis showed interval slight improvement in liver metastasis. No other new lesions.      10/12/2015 Imaging     CT abdomen and pelvis with contrast showed  continued interval decrease in size of multiple hepatic metastases,  no other new lesions.        HISTORY OF PRESENTING ILLNESS:  Christina [name] 55 y.o. female with past medical history of endometriosis, uterine fibroids, thalassemia trait, iron deficient anemia, who was recently found to have a sigmoid rectal cancer. I initially saw her in the hospital, she is here for the first follow-up.  She has been having intermittent rectal bleeding for the past 3 months. She thought it was related to her endometriosis, did not seek medical attention. She also has intermittent mild nausea, especially in the morning, and left lower quadrant abdominal pain. She was found to have profound anemia with hemoglobin 6.5 last week, and received blood transfusion. She was referred to gastroenterologist Dr. Collene Mares last week, underwent colonoscopy 2 days ago, which showed a obstructing sigmoid rectal mass, per patient, the colonoscopy report is not available today. Biopsy was done, but the pathology report is still pending.  She developed severe nausea, and vomited several times with clear gastric liquid. She called [name], and I was told to come to [facility] emergency room by on-call physician Dr. Carlean Purl. She had a CT scan done in the emergency room today.  Her appetite was fairly normal up to 2 days ago before recent hospital admission, when she was found to have a colorectal mass. She lost about 45 pounds  in the past few weeks. 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 family history was positive for colon cancer in her maternal cousin. She is married, lives with her husband, no children.  CURRENT THERAPY: FOLFIRI and Avastin every 2 weeks, started on 05/08/2015, dose reduction and neulasta added from cycle 7 due to prolonged neutropenia, held since cycle 24  INTERIM HISTORY [name] returns for follow-up and chemo. She noticed the subcutaneous  nodule in the epigastric area has been hurting more lately, no skin erythema or swelling. She also reports some crackling when she sleeps on left side, and clear, small amount sputum production. No fever or chills, no dyspnea. She felt she had already cyst burst last week, and developed moderate to severe pain in the right lower quadrant of abdomen. The pain has improved over time. No other complaints. Her appetite and energy level remains to be moderate and is stable. She gained a few pounds. No leg swelling.  MEDICAL HISTORY:  Past Medical History:  Diagnosis Date  . Endometriosis   . Hypertension   . met colon ca to liver dx'd 03/2015  . Thalassanemia     SURGICAL HISTORY: Past Surgical History:  Procedure Laterality Date  . COLON RESECTION N/A 04/08/2015   Procedure: LAPAROSCOPIC  RESECTION OF PART OF  SIGMOID COLON;  Surgeon: Michael Boston, MD;  Location: [facility];  Service: General;  Laterality: N/A;  . DIAGNOSTIC LAPAROSCOPY     Endometriosis  . LAPAROSCOPIC SIGMOID COLECTOMY  04/08/2015   for colorectal cancer  . LIVER BIOPSY N/A 04/08/2015   Procedure: CORE NEEDLE LIVER BIOPSY;  Surgeon: Michael Boston, MD;  Location: [facility];  Service: General;  Laterality: N/A;  . MYOMECTOMY     Gyn in [place]  . PORTACATH PLACEMENT N/A 04/08/2015   Procedure: INSERTION PORT-A-CATH;  Surgeon: Michael Boston, MD;  Location: [facility];  Service: General;  Laterality: N/A;    SOCIAL HISTORY: Social History   Social History  . Marital status: Married    Spouse name: N/A  . Number of children: N/A  . Years of education: N/A   Occupational History  . Internal Medicine doctor     Works in [place] Topics  . Smoking status: Never Smoker  . Smokeless tobacco: Never Used  . Alcohol use Yes     Comment: socail drinker   . Drug use: No  . Sexual activity: Not on file   Other Topics Concern  . Not on file   Social History Narrative   Married, husband [name]  (married X 2 years)   No children   IM Physician in [place]: Family History  Problem Relation Age of Onset  . Anesthesia problems Cousin 69    maternal cousin, colon cancer   . Clotting disorder Maternal Grandmother     ALLERGIES:  is allergic to lactose intolerance (gi); milk-related compounds; and nsaids.  MEDICATIONS:  Current Outpatient Prescriptions  Medication Sig Dispense Refill  . cyclobenzaprine (FLEXERIL) 10 MG tablet Take 1 tablet (10 mg total) by mouth 2 (two) times daily as needed for muscle spasms. 10 tablet 0  . dexamethasone (DECADRON) 4 MG tablet Take 1 tablet (4 mg total) by mouth 2 (two) times daily with a meal. 10 tablet 1  . diclofenac (VOLTAREN) 50 MG EC tablet Take 50 mg by mouth daily.    Marland Kitchen ibuprofen (ADVIL,MOTRIN) 200 MG tablet Take 200 mg by  mouth every 6 (six) hours as needed for moderate pain.    Marland Kitchen lidocaine-prilocaine (EMLA) cream Apply 1 application topically as needed. 30 g 6  . LORazepam (ATIVAN) 0.5 MG tablet Take 1 tablet (0.5 mg total) by mouth every 8 (eight) hours. Take 0.5 mg po every 8 hours as needed for nausea/vomiting. 30 tablet 0  . NIFEdipine (PROCARDIA-XL/ADALAT CC) 30 MG 24 hr tablet Take 30 mg by mouth daily.    . ondansetron (ZOFRAN) 4 MG tablet Take 1 tablet (4 mg total) by mouth every 6 (six) hours as needed for nausea. 30 tablet 2  . oxyCODONE (OXY IR/ROXICODONE) 5 MG immediate release tablet Take 1 tablet (5 mg total) by mouth every 6 (six) hours as needed for moderate pain. 60 tablet 0  . potassium chloride SA (K-DUR,KLOR-CON) 20 MEQ tablet Take 1 tablet (20 mEq total) by mouth 2 (two) times daily. (Patient taking differently: Take 20 mEq by mouth daily. ) 30 tablet 1  . prochlorperazine (COMPAZINE) 10 MG tablet Take 1 tablet (10 mg total) by mouth every 6 (six) hours as needed for nausea or vomiting. 30 tablet 1   No current facility-administered medications for this visit.     REVIEW OF SYSTEMS:     Constitutional: Denies fevers, chills or abnormal night sweats, (+) fatigue Eyes: Denies blurriness of vision, double vision or watery eyes Ears, nose, mouth, throat, and face: Denies mucositis or sore throat Respiratory: Denies cough, dyspnea or wheezes Cardiovascular: Denies palpitation, chest discomfort or lower extremity swelling Gastrointestinal:  (+) nausea, (+) RUQ and rectal pain, and mild constipation Skin: Denies abnormal skin rashes Lymphatics: Denies new lymphadenopathy or easy bruising Neurological:Denies numbness, tingling or new weaknesses, (+) intermittent right flank pain Behavioral/Psych: Mood is stable, no new changes  All other systems were reviewed with the patient and are negative.  PHYSICAL EXAMINATION: ECOG PERFORMANCE STATUS: 1 BP (!) 148/85 (BP Location: Right Arm, Patient Position: Sitting) Comment: informed nurse  Pulse 96   Temp 98.2 F (36.8 C) (Oral)   Resp 18   Ht 5' 6"  (1.676 m)   Wt 180 lb 11.2 oz (82 kg)   SpO2 99%   BMI 29.17 kg/m   GENERAL:alert, no distress and comfortable SKIN: skin color, texture, turgor are normal, no rashes or significant lesions EYES: normal, conjunctiva are pink and non-injected, sclera clear OROPHARYNX:no exudate, no erythema and lips, buccal mucosa, and tongue normal  NECK: supple, thyroid normal size, non-tender, without nodularity LYMPH:  no palpable lymphadenopathy in the cervical, axillary or inguinal LUNGS: clear to auscultation and percussion with normal breathing effort HEART: regular rate & rhythm and no murmurs and no lower extremity edema ABDOMEN:abdomen soft, non-tender and normal bowel sounds.  There is a small subcutaneous nodule in the epigastric area, tender, no skin erythema Musculoskeletal:no cyanosis of digits and no clubbing  PSYCH: alert & oriented x 3 with fluent speech NEURO: no focal motor/sensory deficitsall  LABORATORY DATA:  I have reviewed the data as listed CBC Latest Ref Rng & Units  05/27/2016 05/13/2016 04/29/2016  WBC 3.9 - 10.3 10e3/uL 4.3 3.7(L) 9.7  Hemoglobin 11.6 - 15.9 g/dL 12.0 11.7 11.7  Hematocrit 34.8 - 46.6 % 39.9 39.1 39.2  Platelets 145 - 400 10e3/uL 309 356 295    CMP Latest Ref Rng & Units 05/13/2016 04/29/2016 04/15/2016  Glucose 70 - 140 mg/dl 97 122 111  BUN 7.0 - 26.0 mg/dL 9.1 9.9 9.8  Creatinine 0.6 - 1.1 mg/dL 1.0 1.0 1.0  Sodium 136 -  145 mEq/L 145 142 143  Potassium 3.5 - 5.1 mEq/L 3.3(L) 3.7 3.3(L)  Chloride 101 - 111 mmol/L - - -  CO2 22 - 29 mEq/L 23 22 23   Calcium 8.4 - 10.4 mg/dL 9.6 9.5 9.4  Total Protein 6.4 - 8.3 g/dL 7.4 7.7 7.5  Total Bilirubin 0.20 - 1.20 mg/dL 0.47 0.50 0.48  Alkaline Phos 40 - 150 U/L 107 146 154(H)  AST 5 - 34 U/L 15 16 15   ALT 0 - 55 U/L <9 11 10     CEA  06/19/2015: 457.8 08/14/2015: 442.6 12/04/2015: 293.1 03/04/2016: 132.1 04/15/2016: 140.7   Diagnosis 04/08/2015 1. Liver, needle/core biopsy, ? cancer - METASTATIC ADENOCARCINOMA. 2. Colon, segmental resection for tumor, sigmoid colon mass open end proximal - COLONIC ADENOCARCINOMA WITH MUCINOUS FEATURES EXTENDING INTO PERICOLONIC ADIPOSE TISSUE AND SUBSEROSAL CONNECTIVE TISSUE. - MARGINS NOT INVOLVED. - FIFTEEN BENIGN LYMPH NODES (0/15). 3. Colon, resection margin (donut), distal anastomic ring - BENIGN COLON. - NO EVIDENCE OF MALIGNANCY.  Microscopic Comment Specimen: Sigmoid colon with liver biopsy and anastomotic rings. Procedure: Segmental resection with liver biopsy. Tumor site: Distal sigmoid. Specimen integrity: Intact. Macroscopic intactness of mesorectum: N/A Macroscopic tumor perforation: No. Invasive tumor: Maximum size: 8 cm Histologic type(s): Colorectal adenocarcinoma with mucinous features. Histologic grade and differentiation: G2: moderately differentiated/low grade Type of polyp in which invasive carcinoma arose: No residual polyp. Microscopic extension of invasive tumor: Into pericolonic adipose tissue and subserosal  connective tissue. Lymph-Vascular invasion: Present. Peri-neural invasion: Present. Tumor deposit(s) (discontinuous extramural extension): No. Resection margins: Proximal margin: Free of tumor. Distal margin: Free of tumor. Circumferential (radial) (posterior ascending, posterior descending; lateral and posterior mid-rectum; and entire lower 1/3 rectum): N/A Mesenteric margin (sigmoid and transverse): Free of tumor. Distance closest margin (if all above margins negative): 2 cm from mesenteric margin. Treatment effect (neo-adjuvant therapy): No. Additional polyp(s): No. Non-neoplastic findings: None. Lymph nodes: number examined - 15; number positive: 0 Pathologic Staging: pT3, pN0, pM1 Ancillary studies: MMR pending. (JDP:gt, 04/10/15) 2. Mismatch Repair (MMR) Protein Immunohistochemistry (IHC) IHC Expression Result: MLH1: Preserved nuclear expression (greater 50% tumor expression) MSH2: Preserved nuclear expression (greater 50% tumor expression) MSH6: Preserved nuclear expression (greater 50% tumor expression) PMS2: Preserved nuclear expression (greater 50% tumor expression) * Internal control demonstrates intact nuclear expression Interpretation: NORMAL   FoundationOne test result   RADIOGRAPHIC STUDIES: I have personally reviewed the radiological images as listed and agreed with the findings in the report.  Ct chest, abdomen and pelvis W Contrast 01/14/2016 IMPRESSION: 1. Liver metastases are stable to mildly decreased in size. No new or progressive metastatic disease. 2. Nodular curvilinear soft tissue density in the midline ventral upper abdominal wall, stable back to 07/14/2015, presumably postsurgical scarring, although cannot exclude stable superficial tumor seeding a surgical tract. 3. No evidence of local tumor recurrence at the distal colonic anastomosis. 4. Additional findings include 1 vessel coronary atherosclerosis, stable dilated main pulmonary artery suggesting  chronic pulmonary arterial hypertension, trace bilateral pleural effusions, cholelithiasis, tiny hiatal hernia and bulky myomatous uterus.  Bone scan 03/31/2016 IMPRESSION: Negative bone scan.  ASSESSMENT & PLAN:  55 year old African-American female, with past medical history of hypertension, thalassemia trait, iron deficient anemia, uterine fibroids, endometriosis, who presents with intermittent rectal bleeding, mild nausea, and mild intermittent left lower quadrant abdominal pain.   1. Sigmoid rectal adenocarcinoma, with liver metastases, pT3N0M1, stage IV, MMR normal, NRAS and BRAF mutation (+)  -I reviewed her surgical pathology findings with her in great details. Her liver biopsy confirmed metastatic  adenocarcinoma  From colon cancer. Her primary sigmoid rectal tumor has been completely removed. -We again reviewed her CT findings, it showed at least 4 liver metastatic lesion, with the largest one measuring 9.1 cm in the left lobe. No other distant metastasis -We reviewed the natural history of metastatic colon cancer, and incurable nature at this is stage, and palliative goal of therapy-Her tumor has BRAF mutation, which is a poor prognostic factor  -Her tumor has NRAS mutation, not a candidate for EGFR inhibitor  -Given the MSI-stable, she might not benefit from immunotherapy, except in a clinically trial setting  -I reviewed her restaging CT scan from 01/14/2016, which showed slight improvement in liver mets, no other new lesions, she has had a good partial response so far.  - her tumor marker CEA has been trending down lately, corresponding to good response to chemotherapy -I discussed her recent bone scan, which showed no evidence of bone metastasis. -Her muscle spasm and pain has resolved after she stopped omeprazole. -She is clinically doing well, tolerating treatment very well, lab reviewed, normal CBC and CMP. We'll continue chemotherapy until disease progression or intolerance. We  discussed chemotherapy break, she doesn't want break for now.  -due to her prolonged neutropenia, Neulasta was added on from cycle 7. However he was denied by her insurance daily, we'll hold the Neulasta for now. We may have to reduce her chemotherapy dose if she develops neutropenia. I have called her insurance to appeal her neulasta denial.  -Her nausea has improved with IV Emend -Restaging in 2 weeks, I'll try to get a PET, to see if she has extrahepatic lesion, and if she is a candidate for liver targeted therapy.  2. Right low abdomen pain -She feels is related to her ovarian cyst burst -continue follow up   3 Iron deficient Anemia, and beta thalassemia trait  -Secondary to GI bleeding and iron deficiency -Ferritin level was 8, consistent with iron deficiency from GI bleeding -She received Feraheme 510 mg twice,  responded very well. Her anemia resolved, even she is getting chemotherapy.  -her repeated iron study on 12/18/2015 showed ferritin 317 and normal serum iron   5. Hypertension -She has no history of hypertension. Blood pressure has been slightly elevated in the past months, repeated normal. Possibly related to Avastin -I'll continue monitoring her blood pressure closely. Consider adding amlodipine if persistent high.  6. Peripheral neuropathy, G1 -Secondary to chemotherapy, which has been mild and stable, usually resolves before her next cycle chemo  -we'll watch closely. If gets worse, we'll consider dose reduction. -I recommend her to try Neurontin, she declined due to her sensitivity to sedative medication  7. Epigastric subcutaneous nodule -Cyst versus metastatic lesion -We'll see if it's hypermetabolic on her next staging scan  Plan -Lab reviewed, will proceed chemo FOLFIRI and avastin today and continue every 2 weeks, no Neulasta on day 3 -I will see her back in 2 weeks with a restaging PET scan one day before  All questions were answered. The patient knows to call  the clinic with any problems, questions or concerns. I spent 20 minutes counseling the patient face to face. The total time spent in the appointment was 25 minutes and more than 50% was on counseling.     Truitt Merle, MD 05/27/2016

## 2016-05-29 ENCOUNTER — Ambulatory Visit (HOSPITAL_BASED_OUTPATIENT_CLINIC_OR_DEPARTMENT_OTHER): Payer: BLUE CROSS/BLUE SHIELD

## 2016-05-29 VITALS — BP 133/78 | HR 83 | Temp 98.5°F | Resp 18

## 2016-05-29 DIAGNOSIS — Z452 Encounter for adjustment and management of vascular access device: Secondary | ICD-10-CM | POA: Diagnosis not present

## 2016-05-29 DIAGNOSIS — C187 Malignant neoplasm of sigmoid colon: Secondary | ICD-10-CM

## 2016-05-29 DIAGNOSIS — C787 Secondary malignant neoplasm of liver and intrahepatic bile duct: Principal | ICD-10-CM

## 2016-05-29 DIAGNOSIS — C189 Malignant neoplasm of colon, unspecified: Secondary | ICD-10-CM

## 2016-05-29 MED ORDER — SODIUM CHLORIDE 0.9 % IJ SOLN
10.0000 mL | INTRAMUSCULAR | Status: DC | PRN
Start: 2016-05-29 — End: 2016-05-29
  Administered 2016-05-29: 10 mL
  Filled 2016-05-29: qty 10

## 2016-05-29 MED ORDER — HEPARIN SOD (PORK) LOCK FLUSH 100 UNIT/ML IV SOLN
500.0000 [IU] | Freq: Once | INTRAVENOUS | Status: AC | PRN
  Administered 2016-05-29: 500 [IU]
  Filled 2016-05-29: qty 5

## 2016-06-07 ENCOUNTER — Telehealth: Payer: Self-pay | Admitting: Hematology

## 2016-06-07 NOTE — Telephone Encounter (Signed)
Left message re 11/2 appointments. Patient to get schedule 11/2.

## 2016-06-09 ENCOUNTER — Encounter (HOSPITAL_COMMUNITY): Payer: BLUE CROSS/BLUE SHIELD

## 2016-06-09 ENCOUNTER — Other Ambulatory Visit: Payer: Self-pay | Admitting: Hematology

## 2016-06-09 DIAGNOSIS — C189 Malignant neoplasm of colon, unspecified: Secondary | ICD-10-CM

## 2016-06-09 DIAGNOSIS — C787 Secondary malignant neoplasm of liver and intrahepatic bile duct: Principal | ICD-10-CM

## 2016-06-10 ENCOUNTER — Ambulatory Visit (HOSPITAL_BASED_OUTPATIENT_CLINIC_OR_DEPARTMENT_OTHER): Payer: BLUE CROSS/BLUE SHIELD

## 2016-06-10 ENCOUNTER — Other Ambulatory Visit (HOSPITAL_BASED_OUTPATIENT_CLINIC_OR_DEPARTMENT_OTHER): Payer: BLUE CROSS/BLUE SHIELD

## 2016-06-10 ENCOUNTER — Telehealth: Payer: Self-pay | Admitting: Hematology

## 2016-06-10 ENCOUNTER — Telehealth: Payer: Self-pay | Admitting: *Deleted

## 2016-06-10 ENCOUNTER — Encounter: Payer: Self-pay | Admitting: Hematology

## 2016-06-10 ENCOUNTER — Ambulatory Visit (HOSPITAL_BASED_OUTPATIENT_CLINIC_OR_DEPARTMENT_OTHER): Payer: BLUE CROSS/BLUE SHIELD | Admitting: Hematology

## 2016-06-10 VITALS — BP 136/95 | HR 97

## 2016-06-10 VITALS — BP 138/101 | HR 103 | Temp 97.9°F | Resp 20 | Ht 66.0 in | Wt 179.2 lb

## 2016-06-10 DIAGNOSIS — Z5112 Encounter for antineoplastic immunotherapy: Secondary | ICD-10-CM

## 2016-06-10 DIAGNOSIS — C187 Malignant neoplasm of sigmoid colon: Secondary | ICD-10-CM

## 2016-06-10 DIAGNOSIS — I10 Essential (primary) hypertension: Secondary | ICD-10-CM

## 2016-06-10 DIAGNOSIS — Z5111 Encounter for antineoplastic chemotherapy: Secondary | ICD-10-CM

## 2016-06-10 DIAGNOSIS — C189 Malignant neoplasm of colon, unspecified: Secondary | ICD-10-CM

## 2016-06-10 DIAGNOSIS — D561 Beta thalassemia: Secondary | ICD-10-CM

## 2016-06-10 DIAGNOSIS — C787 Secondary malignant neoplasm of liver and intrahepatic bile duct: Secondary | ICD-10-CM

## 2016-06-10 DIAGNOSIS — D500 Iron deficiency anemia secondary to blood loss (chronic): Secondary | ICD-10-CM

## 2016-06-10 DIAGNOSIS — Z452 Encounter for adjustment and management of vascular access device: Secondary | ICD-10-CM | POA: Diagnosis not present

## 2016-06-10 DIAGNOSIS — G620 Drug-induced polyneuropathy: Secondary | ICD-10-CM

## 2016-06-10 DIAGNOSIS — Z95828 Presence of other vascular implants and grafts: Secondary | ICD-10-CM

## 2016-06-10 LAB — COMPREHENSIVE METABOLIC PANEL
ALBUMIN: 3.7 g/dL (ref 3.5–5.0)
ALK PHOS: 93 U/L (ref 40–150)
ALT: 15 U/L (ref 0–55)
AST: 19 U/L (ref 5–34)
Anion Gap: 8 mEq/L (ref 3–11)
BILIRUBIN TOTAL: 0.74 mg/dL (ref 0.20–1.20)
BUN: 13.7 mg/dL (ref 7.0–26.0)
CO2: 24 meq/L (ref 22–29)
Calcium: 9.4 mg/dL (ref 8.4–10.4)
Chloride: 109 mEq/L (ref 98–109)
Creatinine: 1 mg/dL (ref 0.6–1.1)
EGFR: 78 mL/min/{1.73_m2} — AB (ref >90)
GLUCOSE: 96 mg/dL (ref 70–140)
Potassium: 3.9 mEq/L (ref 3.5–5.1)
SODIUM: 141 meq/L (ref 136–145)
TOTAL PROTEIN: 7.2 g/dL (ref 6.4–8.3)

## 2016-06-10 LAB — CBC WITH DIFFERENTIAL/PLATELET
BASO%: 0.3 % (ref 0.0–2.0)
BASOS ABS: 0 10*3/uL (ref 0.0–0.1)
EOS ABS: 0.2 10*3/uL (ref 0.0–0.5)
EOS%: 4.3 % (ref 0.0–7.0)
HCT: 39.8 % (ref 34.8–46.6)
HEMOGLOBIN: 12.1 g/dL (ref 11.6–15.9)
LYMPH%: 38 % (ref 14.0–49.7)
MCH: 22.7 pg — ABNORMAL LOW (ref 25.1–34.0)
MCHC: 30.4 g/dL — ABNORMAL LOW (ref 31.5–36.0)
MCV: 74.8 fL — AB (ref 79.5–101.0)
MONO#: 0.5 10*3/uL (ref 0.1–0.9)
MONO%: 14.6 % — ABNORMAL HIGH (ref 0.0–14.0)
NEUT%: 42.8 % (ref 38.4–76.8)
NEUTROS ABS: 1.6 10*3/uL (ref 1.5–6.5)
NRBC: 1 % — AB (ref 0–0)
Platelets: 285 10*3/uL (ref 145–400)
RBC: 5.32 10*6/uL (ref 3.70–5.45)
RDW: 19.8 % — ABNORMAL HIGH (ref 11.2–14.5)
WBC: 3.7 10*3/uL — ABNORMAL LOW (ref 3.9–10.3)
lymph#: 1.4 10*3/uL (ref 0.9–3.3)

## 2016-06-10 LAB — IRON AND TIBC
%SAT: 13 % — ABNORMAL LOW (ref 21–57)
IRON: 41 ug/dL (ref 41–142)
TIBC: 314 ug/dL (ref 236–444)
UIBC: 272 ug/dL (ref 120–384)

## 2016-06-10 LAB — FERRITIN: FERRITIN: 72 ng/mL (ref 9–269)

## 2016-06-10 MED ORDER — PALONOSETRON HCL INJECTION 0.25 MG/5ML
0.2500 mg | Freq: Once | INTRAVENOUS | Status: AC
  Administered 2016-06-10: 0.25 mg via INTRAVENOUS

## 2016-06-10 MED ORDER — SODIUM CHLORIDE 0.9 % IJ SOLN
10.0000 mL | INTRAMUSCULAR | Status: DC | PRN
Start: 2016-06-10 — End: 2016-06-10
  Administered 2016-06-10: 10 mL via INTRAVENOUS
  Filled 2016-06-10: qty 10

## 2016-06-10 MED ORDER — ATROPINE SULFATE 1 MG/ML IJ SOLN
0.5000 mg | Freq: Once | INTRAMUSCULAR | Status: AC | PRN
  Administered 2016-06-10: 0.5 mg via INTRAVENOUS

## 2016-06-10 MED ORDER — ATROPINE SULFATE 1 MG/ML IJ SOLN
INTRAMUSCULAR | Status: AC
  Filled 2016-06-10: qty 1

## 2016-06-10 MED ORDER — IRINOTECAN HCL CHEMO INJECTION 100 MG/5ML
180.0000 mg/m2 | Freq: Once | INTRAVENOUS | Status: AC
  Administered 2016-06-10: 320 mg via INTRAVENOUS
  Filled 2016-06-10: qty 15

## 2016-06-10 MED ORDER — SODIUM CHLORIDE 0.9 % IV SOLN
5.5000 mg/kg | Freq: Once | INTRAVENOUS | Status: AC
  Administered 2016-06-10: 400 mg via INTRAVENOUS
  Filled 2016-06-10: qty 16

## 2016-06-10 MED ORDER — SODIUM CHLORIDE 0.9 % IV SOLN
Freq: Once | INTRAVENOUS | Status: AC
  Administered 2016-06-10: 14:00:00 via INTRAVENOUS
  Filled 2016-06-10: qty 5

## 2016-06-10 MED ORDER — LEUCOVORIN CALCIUM INJECTION 350 MG
400.0000 mg/m2 | Freq: Once | INTRAVENOUS | Status: AC
  Administered 2016-06-10: 732 mg via INTRAVENOUS
  Filled 2016-06-10: qty 36.6

## 2016-06-10 MED ORDER — PALONOSETRON HCL INJECTION 0.25 MG/5ML
INTRAVENOUS | Status: AC
  Filled 2016-06-10: qty 5

## 2016-06-10 MED ORDER — FLUOROURACIL CHEMO INJECTION 5 GM/100ML
2400.0000 mg/m2 | INTRAVENOUS | Status: DC
  Administered 2016-06-10: 4400 mg via INTRAVENOUS
  Filled 2016-06-10: qty 88

## 2016-06-10 MED ORDER — SODIUM CHLORIDE 0.9 % IV SOLN
Freq: Once | INTRAVENOUS | Status: AC
  Administered 2016-06-10: 13:00:00 via INTRAVENOUS

## 2016-06-10 NOTE — Patient Instructions (Signed)

## 2016-06-10 NOTE — Progress Notes (Signed)
Per Thu RN - Adrucil pump needs to be programmed for 44 hours not 46.  [facility] RN aware

## 2016-06-10 NOTE — Telephone Encounter (Signed)
Message sent to chemo scheduler to be added. Appointments scheduled per 06/10/16 los. AVS report and appointment schedule given to patient, per 06/10/16 los. °

## 2016-06-10 NOTE — Progress Notes (Addendum)
[place]  Telephone:([phone]) [phone] Fax:([phone]) [phone]  Clinic follow up Note   Patient Care Team: Christina Mccreedy, MD as PCP - General (Internal Medicine) Christina Craver, MD as Consulting Physician (Gastroenterology) Christina Boston, MD as Consulting Physician (General Surgery) Christina Merle, MD as Consulting Physician (Oncology) 06/10/2016   CHIEF COMPLAINTS:  Follow up metastatic colon cancer      Oncology History   Metastatic colon cancer to liver   Staging form: Colon and Rectum, AJCC 7th Edition     Pathologic stage from 04/08/2015: T3, N0, M1 - Signed by Christina Merle, MD on 04/22/2015  Presented to ER with intractable N/V; intermittent rectal bleeding X 3 months      Metastatic colon cancer to liver ([place])   04/03/2015 Procedure    Colonoscopy showed a obstructing sigmoid rectal mass. Biopsy showed adenocarcinoma.      04/06/2015 Tumor Marker    CEA=467.3 / CA19.9=1605      04/06/2015 Imaging    CT chest, abdomen and pelvis with contrast showed sigmoid colon rectal mass, multiple (4) liver metastasis, with the largest 9.1 x 6.1 cm mass in the left lobe..      04/08/2015 Miscellaneous    Foundation one genomic testing showed NRAS G60e and BRAF [id number] mutations      04/08/2015 Initial Diagnosis    Metastatic colon cancer to liver      04/08/2015 Surgery    Laparoscopic sigmoid colectomy, liver biopsy, port cath insertion, by Dr. Johney [name]       04/08/2015 Pathology Results    Sigmoid colon segmental resection showed adenocarcinoma with mucinous features, pT 3, 15 lymph nodes all negative, surgical margins were negative. Liver biopsy showed metastatic adenocarcinoma.      05/08/2015 -  Chemotherapy    FOLFIRI every 2 weeks, Avastin was added on from second cycle       07/14/2015 Imaging    CT abdomen and pelvis showed interval slight improvement in liver metastasis. No other new lesions.      10/12/2015 Imaging     CT abdomen and pelvis with contrast showed  continued interval decrease in size of multiple hepatic metastases,  no other new lesions.        HISTORY OF PRESENTING ILLNESS:  Christina [name] 55 y.o. female with past medical history of endometriosis, uterine fibroids, thalassemia trait, iron deficient anemia, who was recently found to have a sigmoid rectal cancer. I initially saw her in the hospital, she is here for the first follow-up.  She has been having intermittent rectal bleeding for the past 3 months. She thought it was related to her endometriosis, did not seek medical attention. She also has intermittent mild nausea, especially in the morning, and left lower quadrant abdominal pain. She was found to have profound anemia with hemoglobin 6.5 last week, and received blood transfusion. She was referred to gastroenterologist Dr. Collene [name] last week, underwent colonoscopy 2 days ago, which showed a obstructing sigmoid rectal mass, per patient, the colonoscopy report is not available today. Biopsy was done, but the pathology report is still pending.  She developed severe nausea, and vomited several times with clear gastric liquid. She called Christina [name] office, and I was told to come to [facility] emergency room by on-call physician Dr. Carlean [name]. She had a CT scan done in the emergency room today.  Her appetite was fairly normal up to 2 days ago before recent hospital admission, when she was found to have a colorectal mass. She lost about 45 pounds  in the past few weeks. 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 family history was positive for colon cancer in her maternal cousin. She is married, lives with her husband, no children.  CURRENT THERAPY: FOLFIRI and Avastin every 2 weeks, started on 05/08/2015, dose reduction and neulasta added from cycle 7 due to prolonged neutropenia, held since cycle 24 due to lack of insurance coverage  INTERIM HISTORY Christina [name] returns for follow-up and chemo.  She caught a cold last week, with significant nasal congestion, mild dry cough, fatigue, no fever or chest pain. She was on call last week and felt extremely fatigued after the work. She has recovered much better this week, still mildly congested, with mild shortness breath on exertion. She also noticed some abdominal bloating, and some week and this week. No leg swollen. No fever or chills. No other new complaints.  MEDICAL HISTORY:  Past Medical History:  Diagnosis Date  . Endometriosis   . Hypertension   . met colon ca to liver dx'd 03/2015  . Thalassanemia     SURGICAL HISTORY: Past Surgical History:  Procedure Laterality Date  . COLON RESECTION N/A 04/08/2015   Procedure: LAPAROSCOPIC  RESECTION OF PART OF  SIGMOID COLON;  Surgeon: Christina Boston, MD;  Location: [facility];  Service: General;  Laterality: N/A;  . DIAGNOSTIC LAPAROSCOPY     Endometriosis  . LAPAROSCOPIC SIGMOID COLECTOMY  04/08/2015   for colorectal cancer  . LIVER BIOPSY N/A 04/08/2015   Procedure: CORE NEEDLE LIVER BIOPSY;  Surgeon: Christina Boston, MD;  Location: [facility];  Service: General;  Laterality: N/A;  . MYOMECTOMY     Gyn in [place]  . PORTACATH PLACEMENT N/A 04/08/2015   Procedure: INSERTION PORT-A-CATH;  Surgeon: Christina Boston, MD;  Location: [facility];  Service: General;  Laterality: N/A;    SOCIAL HISTORY: Social History   Social History  . Marital status: Married    Spouse name: N/A  . Number of children: N/A  . Years of education: N/A   Occupational History  . Internal Medicine doctor     Works in [employer]  . Smoking status: Never Smoker  . Smokeless tobacco: Never Used  . Alcohol use Yes     Comment: socail drinker   . Drug use: No  . Sexual activity: Not on file   Other Topics Concern  . Not on file   Social History Narrative   Married, husband Christina [name] (married X 2 years)   No children   IM Physician in [place]: Family  History  Problem Relation Age of Onset  . Anesthesia problems Cousin 73    maternal cousin, colon cancer   . Clotting disorder Maternal Grandmother     ALLERGIES:  is allergic to lactose intolerance (gi); milk-related compounds; and nsaids.  MEDICATIONS:  Current Outpatient Prescriptions  Medication Sig Dispense Refill  . cyclobenzaprine (FLEXERIL) 10 MG tablet Take 1 tablet (10 mg total) by mouth 2 (two) times daily as needed for muscle spasms. 10 tablet 0  . dexamethasone (DECADRON) 4 MG tablet Take 1 tablet (4 mg total) by mouth 2 (two) times daily with a meal. 10 tablet 1  . diclofenac (VOLTAREN) 50 MG EC tablet Take 50 mg by mouth daily.    Marland Kitchen ibuprofen (ADVIL,MOTRIN) 200 MG tablet Take 200 mg by mouth every 6 (six) hours as needed for moderate pain.    Marland Kitchen lidocaine-prilocaine (EMLA)  cream Apply 1 application topically as needed. 30 g 6  . LORazepam (ATIVAN) 0.5 MG tablet Take 1 tablet (0.5 mg total) by mouth every 8 (eight) hours. Take 0.5 mg po every 8 hours as needed for nausea/vomiting. 30 tablet 0  . NIFEdipine (PROCARDIA-XL/ADALAT CC) 30 MG 24 hr tablet Take 30 mg by mouth daily.    . ondansetron (ZOFRAN) 4 MG tablet Take 1 tablet (4 mg total) by mouth every 6 (six) hours as needed for nausea. 30 tablet 2  . oxyCODONE (OXY IR/ROXICODONE) 5 MG immediate release tablet Take 1 tablet (5 mg total) by mouth every 6 (six) hours as needed for moderate pain. 60 tablet 0  . potassium chloride SA (K-DUR,KLOR-CON) 20 MEQ tablet Take 1 tablet (20 mEq total) by mouth 2 (two) times daily. (Patient taking differently: Take 20 mEq by mouth daily. ) 30 tablet 1  . prochlorperazine (COMPAZINE) 10 MG tablet Take 1 tablet (10 mg total) by mouth every 6 (six) hours as needed for nausea or vomiting. 30 tablet 1   No current facility-administered medications for this visit.    Facility-Administered Medications Ordered in Other Visits  Medication Dose Route Frequency Provider Last Rate Last Dose  .  fluorouracil (ADRUCIL) 4,400 mg in sodium chloride 0.9 % 62 mL chemo infusion  2,400 mg/m2 (Treatment Plan Recorded) Intravenous 1 day or 1 dose Christina Merle, MD        REVIEW OF SYSTEMS:   Constitutional: Denies fevers, chills or abnormal night sweats, (+) fatigue Eyes: Denies blurriness of vision, double vision or watery eyes Ears, nose, mouth, throat, and face: Denies mucositis or sore throat Respiratory: Denies cough, dyspnea or wheezes, (+) mild congestion and dyspnea Cardiovascular: Denies palpitation, chest discomfort or lower extremity swelling Gastrointestinal:  (+) nausea, (+) RUQ and rectal pain, and mild constipation Skin: Denies abnormal skin rashes Lymphatics: Denies new lymphadenopathy or easy bruising Neurological:Denies numbness, tingling or new weaknesses, (+) intermittent right flank pain Behavioral/Psych: Mood is stable, no new changes  All other systems were reviewed with the patient and are negative.  PHYSICAL EXAMINATION: ECOG PERFORMANCE STATUS: 1 BP (!) 138/101 (BP Location: Left Arm, Patient Position: Sitting) Comment: Made the nurse aware of the elevated  BP  Pulse (!) 103   Temp 97.9 F (36.6 C) (Oral)   Resp 20   Ht 5' 6"  (1.676 m)   Wt 179 lb 3.2 oz (81.3 kg)   SpO2 98%   BMI 28.92 kg/m   GENERAL:alert, no distress and comfortable SKIN: skin color, texture, turgor are normal, no rashes or significant lesions EYES: normal, conjunctiva are pink and non-injected, sclera clear OROPHARYNX:no exudate, no erythema and lips, buccal mucosa, and tongue normal  NECK: supple, thyroid normal size, non-tender, without nodularity LYMPH:  no palpable lymphadenopathy in the cervical, axillary or inguinal LUNGS: clear to auscultation and percussion with normal breathing effort. She is mildly tachypenic when she talks, we checked her pulse ox after 5 minutes walking, which was 99%. HEART: regular rate & rhythm and no murmurs and no lower extremity edema ABDOMEN:abdomen  soft, non-tender and normal bowel sounds.  There is a small subcutaneous nodule in the epigastric area, tender, no skin erythema Musculoskeletal:no cyanosis of digits and no clubbing  PSYCH: alert & oriented x 3 with fluent speech NEURO: no focal motor/sensory deficitsall  LABORATORY DATA:  I have reviewed the data as listed CBC Latest Ref Rng & Units 06/10/2016 05/27/2016 05/13/2016  WBC 3.9 - 10.3 10e3/uL 3.7(L) 4.3 3.7(L)  Hemoglobin  11.6 - 15.9 g/dL 12.1 12.0 11.7  Hematocrit 34.8 - 46.6 % 39.8 39.9 39.1  Platelets 145 - 400 10e3/uL 285 309 356    CMP Latest Ref Rng & Units 06/10/2016 05/27/2016 05/13/2016  Glucose 70 - 140 mg/dl 96 105 97  BUN 7.0 - 26.0 mg/dL 13.7 9.2 9.1  Creatinine 0.6 - 1.1 mg/dL 1.0 1.0 1.0  Sodium 136 - 145 mEq/L 141 143 145  Potassium 3.5 - 5.1 mEq/L 3.9 3.5 3.3(L)  Chloride 101 - 111 mmol/L - - -  CO2 22 - 29 mEq/L 24 23 23   Calcium 8.4 - 10.4 mg/dL 9.4 9.4 9.6  Total Protein 6.4 - 8.3 g/dL 7.2 7.2 7.4  Total Bilirubin 0.20 - 1.20 mg/dL 0.74 0.49 0.47  Alkaline Phos 40 - 150 U/L 93 95 107  AST 5 - 34 U/L 19 15 15   ALT 0 - 55 U/L 15 6 <9   ANC 1.6 today   Results for ASANTI, CRAIGO (MRN [id number]) as of 06/10/2016 16:27  Ref. Range 03/18/2016 09:19 06/10/2016 10:49  Iron Latest Ref Range: 41 - 142 ug/dL 45 41  UIBC Latest Ref Range: 120 - 384 ug/dL 324 272  TIBC Latest Ref Range: 236 - 444 ug/dL 369 314  %SAT Latest Ref Range: 21 - 57 % 12 (L) 13 (L)  Ferritin Latest Ref Range: 9 - 269 ng/ml 223 72   CEA  06/19/2015: 457.8 08/14/2015: 442.6 12/04/2015: 293.1 03/04/2016: 132.1 04/15/2016: 140.7 05/27/2016: 122.56   Diagnosis 04/08/2015 1. Liver, needle/core biopsy, ? cancer - METASTATIC ADENOCARCINOMA. 2. Colon, segmental resection for tumor, sigmoid colon mass open end proximal - COLONIC ADENOCARCINOMA WITH MUCINOUS FEATURES EXTENDING INTO PERICOLONIC ADIPOSE TISSUE AND SUBSEROSAL CONNECTIVE TISSUE. - MARGINS NOT INVOLVED. - FIFTEEN BENIGN  LYMPH NODES (0/15). 3. Colon, resection margin (donut), distal anastomic ring - BENIGN COLON. - NO EVIDENCE OF MALIGNANCY.  Microscopic Comment Specimen: Sigmoid colon with liver biopsy and anastomotic rings. Procedure: Segmental resection with liver biopsy. Tumor site: Distal sigmoid. Specimen integrity: Intact. Macroscopic intactness of mesorectum: N/A Macroscopic tumor perforation: No. Invasive tumor: Maximum size: 8 cm Histologic type(s): Colorectal adenocarcinoma with mucinous features. Histologic grade and differentiation: G2: moderately differentiated/low grade Type of polyp in which invasive carcinoma arose: No residual polyp. Microscopic extension of invasive tumor: Into pericolonic adipose tissue and subserosal connective tissue. Lymph-Vascular invasion: Present. Peri-neural invasion: Present. Tumor deposit(s) (discontinuous extramural extension): No. Resection margins: Proximal margin: Free of tumor. Distal margin: Free of tumor. Circumferential (radial) (posterior ascending, posterior descending; lateral and posterior mid-rectum; and entire lower 1/3 rectum): N/A Mesenteric margin (sigmoid and transverse): Free of tumor. Distance closest margin (if all above margins negative): 2 cm from mesenteric margin. Treatment effect (neo-adjuvant therapy): No. Additional polyp(s): No. Non-neoplastic findings: None. Lymph nodes: number examined - 15; number positive: 0 Pathologic Staging: pT3, pN0, pM1 Ancillary studies: MMR pending. (Christina [name]:gt, 04/10/15) 2. Mismatch Repair (MMR) Protein Immunohistochemistry (IHC) IHC Expression Result: MLH1: Preserved nuclear expression (greater 50% tumor expression) MSH2: Preserved nuclear expression (greater 50% tumor expression) MSH6: Preserved nuclear expression (greater 50% tumor expression) PMS2: Preserved nuclear expression (greater 50% tumor expression) * Internal control demonstrates intact nuclear expression Interpretation:  NORMAL   FoundationOne test result   RADIOGRAPHIC STUDIES: I have personally reviewed the radiological images as listed and agreed with the findings in the report.  Ct chest, abdomen and pelvis W Contrast 01/14/2016 IMPRESSION: 1. Liver metastases are stable to mildly decreased in size. No new or progressive metastatic disease. 2. Nodular curvilinear soft  tissue density in the midline ventral upper abdominal wall, stable back to 07/14/2015, presumably postsurgical scarring, although cannot exclude stable superficial tumor seeding a surgical tract. 3. No evidence of local tumor recurrence at the distal colonic anastomosis. 4. Additional findings include 1 vessel coronary atherosclerosis, stable dilated main pulmonary artery suggesting chronic pulmonary arterial hypertension, trace bilateral pleural effusions, cholelithiasis, tiny hiatal hernia and bulky myomatous uterus.  Bone scan 03/31/2016 IMPRESSION: Negative bone scan.  ASSESSMENT & PLAN:  55 year old African-American female, with past medical history of hypertension, thalassemia trait, iron deficient anemia, uterine fibroids, endometriosis, who presents with intermittent rectal bleeding, mild nausea, and mild intermittent left lower quadrant abdominal pain.   1. Sigmoid rectal adenocarcinoma, with liver metastases, pT3N0M1, stage IV, MMR normal, NRAS and BRAF mutation (+)  -I reviewed her surgical pathology findings with her in great details. Her liver biopsy confirmed metastatic adenocarcinoma  From colon cancer. Her primary sigmoid rectal tumor has been completely removed. -We again reviewed her CT findings, it showed at least 4 liver metastatic lesion, with the largest one measuring 9.1 cm in the left lobe. No other distant metastasis -We reviewed the natural history of metastatic colon cancer, and incurable nature at this is stage, and palliative goal of therapy-Her tumor has BRAF mutation, which is a poor prognostic factor   -Her tumor has NRAS mutation, not a candidate for EGFR inhibitor  -Given the MSI-stable, she might not benefit from immunotherapy, except in a clinically trial setting  -I reviewed her restaging CT scan from 01/14/2016, which showed slight improvement in liver mets, no other new lesions, she has had a good partial response so far.  - her tumor marker CEA has been trending down lately, corresponding to good response to chemotherapy -I discussed her recent bone scan, which showed no evidence of bone metastasis. -Her muscle spasm and pain has resolved after she stopped omeprazole. -She is clinically doing well, tolerating treatment very well, lab reviewed, normal CBC and CMP. We'll continue chemotherapy until disease progression or intolerance. We discussed chemotherapy break, she doesn't want break for now.  -Her nausea has improved with IV Emend -restaging PET scan was denied by her insurance, we'll obtain CT chest, abdomen and pelvis with contrast. Due to her abdominal bloating and moderate dyspnea, I'll will get her CT scan done tomorrow.  2. URI -She has had nasal congestion, mild cough, and mild dyspnea on exertion since last week, no fever or chills, no chest pain, her symptoms have improved spontaneously, likely virus infection. -She was not hypoxic after 5 minutes of walking, less concerning for PE   3. Right low abdomen pain -She feels is related to her ovarian cyst burst, improved lately  -continue follow up   4 Iron deficient Anemia, and beta thalassemia trait  -Secondary to GI bleeding and iron deficiency -Ferritin level was 8, consistent with iron deficiency from GI bleeding -She received Feraheme 510 mg twice,  responded very well. Her anemia resolved, even she is getting chemotherapy.  -her repeated iron study have showed normal iron level and ferritin  5. Hypertension -She has no history of hypertension. Blood pressure has been slightly elevated in the past months, repeated  normal. Possibly related to Avastin -I'll continue monitoring her blood pressure closely. Consider adding amlodipine if persistent high.  6. Peripheral neuropathy, G1 -Secondary to chemotherapy, which has been mild and stable, usually resolves before her next cycle chemo  -we'll watch closely. If gets worse, we'll consider dose reduction. -I recommend her to try  Neurontin, she declined due to her sensitivity to sedative medication  7. Epigastric subcutaneous nodule -Cyst versus metastatic lesion -We'll see what it shows on her next scan   Plan -Lab reviewed, will proceed chemo FOLFIRI and avastin today and continue every 2 weeks, no Neulasta on day 3 -Restaging CT chest, abdomen pelvis tomorrow, will do CT chest PE protocol  -I will see her back in 2 weeks   All questions were answered. The patient knows to call the clinic with any problems, questions or concerns. I spent 30 minutes counseling the patient face to face. The total time spent in the appointment was 35 minutes and more than 50% was on counseling.     Christina Merle, MD 06/10/2016    Addendum I called pt and reviewed her CT scan findings from yesterday, her cancer is stable, but she developed newly pleural effusion, more on left, overall small amount. She has had cough and dyspnea last night. I will call in albuterol inhaler, and lasix 47m daily (along with KCL), she agrees with the plan, she knows to call u[place]oreaif her dyspnea gets worse.   FTruitt [name] 06/12/2016 10:43 AM

## 2016-06-10 NOTE — Progress Notes (Signed)
Per Dr. Burr Medico,  [place] to program CIV 5 FU to run over  44 hours instead of 46 hours.

## 2016-06-10 NOTE — Patient Instructions (Signed)
[facility] Discharge Instructions for Patients Receiving Chemotherapy  Today you received the following chemotherapy agents Avastin, Leucovorin, Irinotecan and Adrucil   To help prevent nausea and vomiting after your treatment, we encourage you to take your nausea medication as directed. No Zofran for 3 days. Take Compazine instead.    If you develop nausea and vomiting that is not controlled by your nausea medication, call the clinic.   BELOW ARE SYMPTOMS THAT SHOULD BE REPORTED IMMEDIATELY:  *FEVER GREATER THAN 100.5 F  *CHILLS WITH OR WITHOUT FEVER  NAUSEA AND VOMITING THAT IS NOT CONTROLLED WITH YOUR NAUSEA MEDICATION  *UNUSUAL SHORTNESS OF BREATH  *UNUSUAL BRUISING OR BLEEDING  TENDERNESS IN MOUTH AND THROAT WITH OR WITHOUT PRESENCE OF ULCERS  *URINARY PROBLEMS  *BOWEL PROBLEMS  UNUSUAL RASH Items with * indicate a potential emergency and should be followed up as soon as possible.  Feel free to call the clinic you have any questions or concerns. The clinic phone number is [phone].  Please show the [place] at check-in to the Emergency Department and triage nurse.

## 2016-06-10 NOTE — Telephone Encounter (Signed)
Per LOS I have scheduled apapts and notified the scheuler

## 2016-06-11 ENCOUNTER — Encounter (HOSPITAL_COMMUNITY): Payer: Self-pay

## 2016-06-11 ENCOUNTER — Ambulatory Visit (HOSPITAL_COMMUNITY): Payer: BLUE CROSS/BLUE SHIELD

## 2016-06-11 ENCOUNTER — Ambulatory Visit (HOSPITAL_COMMUNITY)
Admission: RE | Admit: 2016-06-11 | Discharge: 2016-06-11 | Disposition: A | Discharge status: Home / Self Care | Payer: BLUE CROSS/BLUE SHIELD | Source: Ambulatory Visit | Attending: Hematology | Admitting: Hematology

## 2016-06-11 DIAGNOSIS — K8020 Calculus of gallbladder without cholecystitis without obstruction: Secondary | ICD-10-CM | POA: Insufficient documentation

## 2016-06-11 DIAGNOSIS — K449 Diaphragmatic hernia without obstruction or gangrene: Secondary | ICD-10-CM | POA: Insufficient documentation

## 2016-06-11 DIAGNOSIS — J90 Pleural effusion, not elsewhere classified: Secondary | ICD-10-CM | POA: Diagnosis not present

## 2016-06-11 DIAGNOSIS — J9811 Atelectasis: Secondary | ICD-10-CM | POA: Diagnosis not present

## 2016-06-11 DIAGNOSIS — C787 Secondary malignant neoplasm of liver and intrahepatic bile duct: Secondary | ICD-10-CM | POA: Diagnosis not present

## 2016-06-11 DIAGNOSIS — C189 Malignant neoplasm of colon, unspecified: Secondary | ICD-10-CM | POA: Insufficient documentation

## 2016-06-11 DIAGNOSIS — D259 Leiomyoma of uterus, unspecified: Secondary | ICD-10-CM | POA: Diagnosis not present

## 2016-06-11 MED ORDER — IOPAMIDOL (ISOVUE-300) INJECTION 61%
100.0000 mL | Freq: Once | INTRAVENOUS | Status: AC | PRN
  Administered 2016-06-11: 100 mL via INTRAVENOUS

## 2016-06-12 ENCOUNTER — Other Ambulatory Visit: Payer: Self-pay | Admitting: Hematology

## 2016-06-12 ENCOUNTER — Ambulatory Visit (HOSPITAL_BASED_OUTPATIENT_CLINIC_OR_DEPARTMENT_OTHER): Payer: BLUE CROSS/BLUE SHIELD

## 2016-06-12 VITALS — BP 137/100 | HR 101 | Temp 98.7°F | Resp 18 | Ht 66.0 in

## 2016-06-12 DIAGNOSIS — C183 Malignant neoplasm of hepatic flexure: Secondary | ICD-10-CM

## 2016-06-12 DIAGNOSIS — Z452 Encounter for adjustment and management of vascular access device: Secondary | ICD-10-CM | POA: Diagnosis not present

## 2016-06-12 DIAGNOSIS — C189 Malignant neoplasm of colon, unspecified: Secondary | ICD-10-CM

## 2016-06-12 DIAGNOSIS — C187 Malignant neoplasm of sigmoid colon: Secondary | ICD-10-CM | POA: Diagnosis not present

## 2016-06-12 DIAGNOSIS — C787 Secondary malignant neoplasm of liver and intrahepatic bile duct: Principal | ICD-10-CM

## 2016-06-12 MED ORDER — SODIUM CHLORIDE 0.9 % IJ SOLN
10.0000 mL | INTRAMUSCULAR | Status: DC | PRN
  Administered 2016-06-12: 10 mL
  Filled 2016-06-12: qty 10

## 2016-06-12 MED ORDER — HEPARIN SOD (PORK) LOCK FLUSH 100 UNIT/ML IV SOLN
250.0000 [IU] | Freq: Once | INTRAVENOUS | Status: AC | PRN
  Administered 2016-06-12: 500 [IU]
  Filled 2016-06-12: qty 5

## 2016-06-12 MED ORDER — ALBUTEROL SULFATE HFA 108 (90 BASE) MCG/ACT IN AERS: 2.0000 | INHALATION_SPRAY | Freq: Four times a day (QID) | RESPIRATORY_TRACT | 0 refills | Status: DC | PRN

## 2016-06-12 MED ORDER — FUROSEMIDE 20 MG PO TABS: 20.0000 mg | ORAL_TABLET | Freq: Every day | ORAL | 1 refills | Status: DC

## 2016-06-12 MED ORDER — POTASSIUM CHLORIDE CRYS ER 20 MEQ PO TBCR: 20.0000 meq | EXTENDED_RELEASE_TABLET | Freq: Every day | ORAL | 0 refills | Status: DC

## 2016-06-12 NOTE — Addendum Note (Signed)
Addended by: Truitt Merle on: 06/12/2016 10:43 AM   Modules accepted: Orders

## 2016-06-16 ENCOUNTER — Other Ambulatory Visit: Payer: Self-pay

## 2016-06-16 ENCOUNTER — Emergency Department (HOSPITAL_COMMUNITY): Payer: BLUE CROSS/BLUE SHIELD

## 2016-06-16 ENCOUNTER — Inpatient Hospital Stay (HOSPITAL_COMMUNITY)
Admission: EM | Admit: 2016-06-16 | Discharge: 2016-06-20 | DRG: 291 | Disposition: A | Discharge status: Home / Self Care | Payer: BLUE CROSS/BLUE SHIELD | Attending: Family Medicine | Admitting: Family Medicine

## 2016-06-16 ENCOUNTER — Encounter (HOSPITAL_COMMUNITY): Payer: Self-pay | Admitting: Family Medicine

## 2016-06-16 DIAGNOSIS — Z791 Long term (current) use of non-steroidal anti-inflammatories (NSAID): Secondary | ICD-10-CM | POA: Diagnosis not present

## 2016-06-16 DIAGNOSIS — C19 Malignant neoplasm of rectosigmoid junction: Secondary | ICD-10-CM | POA: Diagnosis present

## 2016-06-16 DIAGNOSIS — J181 Lobar pneumonia, unspecified organism: Secondary | ICD-10-CM | POA: Diagnosis not present

## 2016-06-16 DIAGNOSIS — Z95828 Presence of other vascular implants and grafts: Secondary | ICD-10-CM | POA: Diagnosis not present

## 2016-06-16 DIAGNOSIS — A419 Sepsis, unspecified organism: Secondary | ICD-10-CM | POA: Diagnosis not present

## 2016-06-16 DIAGNOSIS — I10 Essential (primary) hypertension: Secondary | ICD-10-CM | POA: Diagnosis not present

## 2016-06-16 DIAGNOSIS — R0602 Shortness of breath: Secondary | ICD-10-CM

## 2016-06-16 DIAGNOSIS — I420 Dilated cardiomyopathy: Secondary | ICD-10-CM | POA: Diagnosis present

## 2016-06-16 DIAGNOSIS — I509 Heart failure, unspecified: Secondary | ICD-10-CM | POA: Diagnosis not present

## 2016-06-16 DIAGNOSIS — C787 Secondary malignant neoplasm of liver and intrahepatic bile duct: Secondary | ICD-10-CM | POA: Diagnosis present

## 2016-06-16 DIAGNOSIS — Z91011 Allergy to milk products: Secondary | ICD-10-CM | POA: Diagnosis not present

## 2016-06-16 DIAGNOSIS — I517 Cardiomegaly: Secondary | ICD-10-CM

## 2016-06-16 DIAGNOSIS — Z9049 Acquired absence of other specified parts of digestive tract: Secondary | ICD-10-CM | POA: Diagnosis not present

## 2016-06-16 DIAGNOSIS — R000 Tachycardia, unspecified: Secondary | ICD-10-CM

## 2016-06-16 DIAGNOSIS — D6481 Anemia due to antineoplastic chemotherapy: Secondary | ICD-10-CM | POA: Diagnosis present

## 2016-06-16 DIAGNOSIS — I5043 Acute on chronic combined systolic (congestive) and diastolic (congestive) heart failure: Secondary | ICD-10-CM | POA: Diagnosis present

## 2016-06-16 DIAGNOSIS — D500 Iron deficiency anemia secondary to blood loss (chronic): Secondary | ICD-10-CM | POA: Diagnosis present

## 2016-06-16 DIAGNOSIS — I272 Pulmonary hypertension, unspecified: Secondary | ICD-10-CM | POA: Diagnosis present

## 2016-06-16 DIAGNOSIS — Z79899 Other long term (current) drug therapy: Secondary | ICD-10-CM | POA: Diagnosis not present

## 2016-06-16 DIAGNOSIS — N179 Acute kidney failure, unspecified: Secondary | ICD-10-CM | POA: Diagnosis present

## 2016-06-16 DIAGNOSIS — J9601 Acute respiratory failure with hypoxia: Secondary | ICD-10-CM | POA: Diagnosis present

## 2016-06-16 DIAGNOSIS — T451X5A Adverse effect of antineoplastic and immunosuppressive drugs, initial encounter: Secondary | ICD-10-CM | POA: Diagnosis present

## 2016-06-16 DIAGNOSIS — I5023 Acute on chronic systolic (congestive) heart failure: Secondary | ICD-10-CM | POA: Diagnosis not present

## 2016-06-16 DIAGNOSIS — I110 Hypertensive heart disease with heart failure: Secondary | ICD-10-CM | POA: Diagnosis present

## 2016-06-16 DIAGNOSIS — Z79891 Long term (current) use of opiate analgesic: Secondary | ICD-10-CM | POA: Diagnosis not present

## 2016-06-16 DIAGNOSIS — Z832 Family history of diseases of the blood and blood-forming organs and certain disorders involving the immune mechanism: Secondary | ICD-10-CM | POA: Diagnosis not present

## 2016-06-16 DIAGNOSIS — T502X5A Adverse effect of carbonic-anhydrase inhibitors, benzothiadiazides and other diuretics, initial encounter: Secondary | ICD-10-CM | POA: Diagnosis present

## 2016-06-16 DIAGNOSIS — Z7952 Long term (current) use of systemic steroids: Secondary | ICD-10-CM

## 2016-06-16 DIAGNOSIS — Z888 Allergy status to other drugs, medicaments and biological substances status: Secondary | ICD-10-CM | POA: Diagnosis not present

## 2016-06-16 DIAGNOSIS — C189 Malignant neoplasm of colon, unspecified: Secondary | ICD-10-CM | POA: Diagnosis not present

## 2016-06-16 DIAGNOSIS — J189 Pneumonia, unspecified organism: Secondary | ICD-10-CM | POA: Diagnosis present

## 2016-06-16 DIAGNOSIS — D563 Thalassemia minor: Secondary | ICD-10-CM | POA: Diagnosis present

## 2016-06-16 LAB — I-STAT CG4 LACTIC ACID, ED: LACTIC ACID, VENOUS: 1.87 mmol/L (ref 0.5–1.9)

## 2016-06-16 LAB — COMPREHENSIVE METABOLIC PANEL
ALT: 34 U/L (ref 14–54)
ANION GAP: 10 (ref 5–15)
AST: 21 U/L (ref 15–41)
Albumin: 4.3 g/dL (ref 3.5–5.0)
Alkaline Phosphatase: 68 U/L (ref 38–126)
BUN: 13 mg/dL (ref 6–20)
CHLORIDE: 106 mmol/L (ref 101–111)
CO2: 23 mmol/L (ref 22–32)
Calcium: 9.4 mg/dL (ref 8.9–10.3)
Creatinine, Ser: 1.07 mg/dL — ABNORMAL HIGH (ref 0.44–1.00)
GFR, EST NON AFRICAN AMERICAN: 57 mL/min — AB (ref >60)
Glucose, Bld: 124 mg/dL — ABNORMAL HIGH (ref 65–99)
POTASSIUM: 4 mmol/L (ref 3.5–5.1)
SODIUM: 139 mmol/L (ref 135–145)
Total Bilirubin: 0.9 mg/dL (ref 0.3–1.2)
Total Protein: 7.2 g/dL (ref 6.5–8.1)

## 2016-06-16 LAB — CBC WITH DIFFERENTIAL/PLATELET
Basophils Absolute: 0 10*3/uL (ref 0.0–0.1)
Basophils Relative: 0 %
EOS ABS: 0.1 10*3/uL (ref 0.0–0.7)
EOS PCT: 2 %
HCT: 41.5 % (ref 36.0–46.0)
Hemoglobin: 12.6 g/dL (ref 12.0–15.0)
LYMPHS ABS: 2.1 10*3/uL (ref 0.7–4.0)
Lymphocytes Relative: 53 %
MCH: 22.6 pg — AB (ref 26.0–34.0)
MCHC: 30.4 g/dL (ref 30.0–36.0)
MCV: 74.4 fL — AB (ref 78.0–100.0)
MONO ABS: 0.3 10*3/uL (ref 0.1–1.0)
Monocytes Relative: 7 %
Neutro Abs: 1.5 10*3/uL — ABNORMAL LOW (ref 1.7–7.7)
Neutrophils Relative %: 39 %
PLATELETS: 443 10*3/uL — AB (ref 150–400)
RBC: 5.58 MIL/uL — AB (ref 3.87–5.11)
RDW: 19.4 % — AB (ref 11.5–15.5)
WBC: 3.9 10*3/uL — AB (ref 4.0–10.5)

## 2016-06-16 LAB — CBC
HEMATOCRIT: 40.6 % (ref 36.0–46.0)
HEMOGLOBIN: 12.3 g/dL (ref 12.0–15.0)
MCH: 22.5 pg — ABNORMAL LOW (ref 26.0–34.0)
MCHC: 30.3 g/dL (ref 30.0–36.0)
MCV: 74.4 fL — AB (ref 78.0–100.0)
Platelets: 478 10*3/uL — ABNORMAL HIGH (ref 150–400)
RBC: 5.46 MIL/uL — AB (ref 3.87–5.11)
RDW: 19.5 % — AB (ref 11.5–15.5)
WBC: 5.1 10*3/uL (ref 4.0–10.5)

## 2016-06-16 LAB — CREATININE, SERUM
Creatinine, Ser: 1.27 mg/dL — ABNORMAL HIGH (ref 0.44–1.00)
GFR calc non Af Amer: 46 mL/min — ABNORMAL LOW (ref >60)
GFR, EST AFRICAN AMERICAN: 54 mL/min — AB (ref >60)

## 2016-06-16 LAB — I-STAT TROPONIN, ED: TROPONIN I, POC: 0 ng/mL (ref 0.00–0.08)

## 2016-06-16 LAB — BRAIN NATRIURETIC PEPTIDE: B NATRIURETIC PEPTIDE 5: 2116.4 pg/mL — AB (ref 0.0–100.0)

## 2016-06-16 MED ORDER — ENOXAPARIN SODIUM 40 MG/0.4ML ~~LOC~~ SOLN
40.0000 mg | Freq: Every day | SUBCUTANEOUS | Status: DC
  Administered 2016-06-17 – 2016-06-19 (×4): 40 mg via SUBCUTANEOUS
  Filled 2016-06-16 (×4): qty 0.4

## 2016-06-16 MED ORDER — DEXTROSE 5 % IV SOLN
500.0000 mg | Freq: Once | INTRAVENOUS | Status: AC
  Administered 2016-06-16: 500 mg via INTRAVENOUS
  Filled 2016-06-16: qty 500

## 2016-06-16 MED ORDER — ONDANSETRON HCL 4 MG PO TABS: 4.0000 mg | ORAL_TABLET | Freq: Four times a day (QID) | ORAL | Status: DC | PRN

## 2016-06-16 MED ORDER — POTASSIUM CHLORIDE CRYS ER 20 MEQ PO TBCR
20.0000 meq | EXTENDED_RELEASE_TABLET | Freq: Every day | ORAL | Status: DC
  Administered 2016-06-17: 20 meq via ORAL
  Filled 2016-06-16: qty 1

## 2016-06-16 MED ORDER — OXYCODONE HCL 5 MG PO TABS
5.0000 mg | ORAL_TABLET | Freq: Four times a day (QID) | ORAL | Status: DC | PRN
  Administered 2016-06-17 – 2016-06-20 (×5): 5 mg via ORAL
  Filled 2016-06-16 (×5): qty 1

## 2016-06-16 MED ORDER — ACETAMINOPHEN 650 MG RE SUPP: 650.0000 mg | Freq: Four times a day (QID) | RECTAL | Status: DC | PRN

## 2016-06-16 MED ORDER — ALBUTEROL SULFATE HFA 108 (90 BASE) MCG/ACT IN AERS: 2.0000 | INHALATION_SPRAY | Freq: Four times a day (QID) | RESPIRATORY_TRACT | Status: DC | PRN

## 2016-06-16 MED ORDER — ONDANSETRON HCL 4 MG PO TABS
4.0000 mg | ORAL_TABLET | Freq: Four times a day (QID) | ORAL | Status: DC | PRN
Start: 2016-06-16 — End: 2016-06-20

## 2016-06-16 MED ORDER — CEFTRIAXONE SODIUM 1 G IJ SOLR
1.0000 g | INTRAMUSCULAR | Status: AC
  Administered 2016-06-17 – 2016-06-18 (×2): 1 g via INTRAVENOUS
  Filled 2016-06-16 (×2): qty 10

## 2016-06-16 MED ORDER — FUROSEMIDE 10 MG/ML IJ SOLN
40.0000 mg | Freq: Two times a day (BID) | INTRAMUSCULAR | Status: DC
  Administered 2016-06-17 – 2016-06-18 (×3): 40 mg via INTRAVENOUS
  Filled 2016-06-16 (×3): qty 4

## 2016-06-16 MED ORDER — NIFEDIPINE ER 30 MG PO TB24
30.0000 mg | ORAL_TABLET | Freq: Every day | ORAL | Status: DC
  Administered 2016-06-17: 30 mg via ORAL
  Filled 2016-06-16 (×3): qty 1

## 2016-06-16 MED ORDER — PROCHLORPERAZINE MALEATE 10 MG PO TABS
10.0000 mg | ORAL_TABLET | Freq: Four times a day (QID) | ORAL | Status: DC | PRN
  Administered 2016-06-19: 10 mg via ORAL
  Filled 2016-06-16: qty 1

## 2016-06-16 MED ORDER — DEXTROSE 5 % IV SOLN
500.0000 mg | INTRAVENOUS | Status: AC
  Administered 2016-06-17 – 2016-06-18 (×2): 500 mg via INTRAVENOUS
  Filled 2016-06-16 (×2): qty 500

## 2016-06-16 MED ORDER — ONDANSETRON HCL 4 MG/2ML IJ SOLN
4.0000 mg | Freq: Once | INTRAMUSCULAR | Status: AC
  Administered 2016-06-16: 4 mg via INTRAVENOUS
  Filled 2016-06-16: qty 2

## 2016-06-16 MED ORDER — CEFTRIAXONE SODIUM 1 G IJ SOLR
1.0000 g | Freq: Once | INTRAMUSCULAR | Status: AC
  Administered 2016-06-16: 1 g via INTRAVENOUS
  Filled 2016-06-16: qty 10

## 2016-06-16 MED ORDER — SODIUM CHLORIDE 0.9% FLUSH
3.0000 mL | Freq: Two times a day (BID) | INTRAVENOUS | Status: DC
  Administered 2016-06-16 – 2016-06-20 (×6): 3 mL via INTRAVENOUS

## 2016-06-16 MED ORDER — OXYCODONE HCL 5 MG PO TABS
5.0000 mg | ORAL_TABLET | Freq: Once | ORAL | Status: AC
  Administered 2016-06-16: 5 mg via ORAL
  Filled 2016-06-16: qty 1

## 2016-06-16 MED ORDER — ONDANSETRON HCL 4 MG/2ML IJ SOLN
4.0000 mg | Freq: Four times a day (QID) | INTRAMUSCULAR | Status: DC | PRN
  Administered 2016-06-17 – 2016-06-20 (×6): 4 mg via INTRAVENOUS
  Filled 2016-06-16 (×6): qty 2

## 2016-06-16 MED ORDER — ACETAMINOPHEN 325 MG PO TABS
650.0000 mg | ORAL_TABLET | Freq: Four times a day (QID) | ORAL | Status: DC | PRN
  Administered 2016-06-17 – 2016-06-18 (×3): 650 mg via ORAL
  Filled 2016-06-16 (×3): qty 2

## 2016-06-16 MED ORDER — FUROSEMIDE 10 MG/ML IJ SOLN
40.0000 mg | Freq: Once | INTRAMUSCULAR | Status: AC
  Administered 2016-06-16: 40 mg via INTRAVENOUS
  Filled 2016-06-16: qty 4

## 2016-06-16 NOTE — ED Triage Notes (Signed)
Patient reports she was diagnosed with bronchitis with pleural effusion on Friday by her oncologist. Treated with Lasix over the weekend. Monday, Tuesday, and Wednesday unable to lay down. Experiencing increase in shortness of breath with chest tightness.

## 2016-06-16 NOTE — ED Notes (Signed)
Bed: WA18 Expected date:  Expected time:  Means of arrival:  Comments: Hold for RES A 

## 2016-06-16 NOTE — H&P (Signed)
History and Physical    Christina [name] WFU:[id number] DOB: 08-11-1960 DOA: 06/16/2016  PCP: Benito Mccreedy, MD  Patient coming from: Home.  Chief Complaint: Shortness of breath.  HPI: Christina [name] is a 55 y.o. female with metastatic colon cancer on chemotherapy, hypertension has been recently experiencing shortness of breath. Patient has been having symptoms of bronchitis for last 3 weeks. Patient's oncologist had CT chest and abdomen 5 days ago which showed bilateral pleural effusion. Patient eventually started developing orthopnea and paroxysmal dyspnea and was started on Lasix 4 days ago orally daily. Despite taking which patient was progressively getting short of breath and presented to the ER. Chest x-ray shows pneumonic process. BNP is markedly elevated. Patient was started on antibiotics and Lasix 40 mg IV was given in the ER. Patient is being admitted for acute respiratory failure probably combination of CHF and pneumonia. Patient denies any chest pain. On exam patient does have mildly elevated JVD. Patient also had 2 episodes of vomiting in the ER. Abdomen appears benign.   ED Course: Lasix 40 mg IV was given. Ceftriaxone and Zithromax was given.  Review of Systems: As per HPI, rest all negative.   Past Medical History:  Diagnosis Date  . Endometriosis   . Hypertension   . met colon ca to liver dx'd 03/2015  . Thalassanemia     Past Surgical History:  Procedure Laterality Date  . COLON RESECTION N/A 04/08/2015   Procedure: LAPAROSCOPIC  RESECTION OF PART OF  SIGMOID COLON;  Surgeon: Michael Boston, MD;  Location: [facility];  Service: General;  Laterality: N/A;  . DIAGNOSTIC LAPAROSCOPY     Endometriosis  . LAPAROSCOPIC SIGMOID COLECTOMY  04/08/2015   for colorectal cancer  . LIVER BIOPSY N/A 04/08/2015   Procedure: CORE NEEDLE LIVER BIOPSY;  Surgeon: Michael Boston, MD;  Location: [facility];  Service: General;  Laterality: N/A;  . MYOMECTOMY     Gyn in [place]  .  PORTACATH PLACEMENT N/A 04/08/2015   Procedure: INSERTION PORT-A-CATH;  Surgeon: Michael Boston, MD;  Location: [facility];  Service: General;  Laterality: N/A;     reports that she has never smoked. She has never used smokeless tobacco. She reports that she drinks alcohol. She reports that she does not use drugs.  Allergies  Allergen Reactions  . Lactose Intolerance (Gi) Other (See Comments)    Diarrhea, Bloated, Abdominal pain.  . Lorazepam Other (See Comments)    Pt couldn't walk straight, put her in a daze  . Milk-Related Compounds Diarrhea    Family History  Problem Relation Age of Onset  . Anesthesia problems Cousin 40    maternal cousin, colon cancer   . Clotting disorder Maternal Grandmother     Prior to Admission medications   Medication Sig Start Date End Date Taking? Authorizing Provider  albuterol (VENTOLIN HFA) 108 (90 Base) MCG/ACT inhaler Inhale 2 puffs into the lungs every 6 (six) hours as needed for wheezing or shortness of breath. 06/12/16  Yes Truitt Merle, MD  dexamethasone (DECADRON) 4 MG tablet Take 1 tablet (4 mg total) by mouth 2 (two) times daily with a meal. Patient taking differently: Take 4 mg by mouth 2 (two) times daily as needed (nausea from chemotherapy).  07/17/15  Yes Truitt Merle, MD  diclofenac (VOLTAREN) 50 MG EC tablet Take 50 mg by mouth daily as needed for moderate pain.    Yes Historical Provider, MD  furosemide (LASIX) 20 MG tablet Take 1 tablet (20 mg total) by mouth daily. 06/12/16  Yes Truitt Merle, MD  lidocaine-prilocaine (EMLA) cream Apply 1 application topically as needed. Patient taking differently: Apply 1 application topically daily as needed (port access).  11/20/15  Yes Truitt Merle, MD  NIFEdipine (PROCARDIA-XL/ADALAT CC) 30 MG 24 hr tablet Take 30 mg by mouth daily.   Yes Historical Provider, MD  ondansetron (ZOFRAN) 4 MG tablet Take 1 tablet (4 mg total) by mouth every 6 (six) hours as needed for nausea. 02/05/16  Yes Truitt Merle, MD  oxyCODONE (OXY  IR/ROXICODONE) 5 MG immediate release tablet Take 1 tablet (5 mg total) by mouth every 6 (six) hours as needed for moderate pain. 05/13/16  Yes Truitt Merle, MD  potassium chloride SA (K-DUR,KLOR-CON) 20 MEQ tablet Take 1 tablet (20 mEq total) by mouth daily. 06/12/16  Yes Truitt Merle, MD  prochlorperazine (COMPAZINE) 10 MG tablet Take 1 tablet (10 mg total) by mouth every 6 (six) hours as needed for nausea or vomiting. 03/24/16  Yes Chauncey Cruel, MD  cyclobenzaprine (FLEXERIL) 10 MG tablet Take 1 tablet (10 mg total) by mouth 2 (two) times daily as needed for muscle spasms. Patient not taking: Reported on 06/16/2016 03/18/16   Sherwood Gambler, MD  levofloxacin (LEVAQUIN) 500 MG tablet Take 500 mg by mouth daily.    Historical Provider, MD  LORazepam (ATIVAN) 0.5 MG tablet Take 1 tablet (0.5 mg total) by mouth every 8 (eight) hours. Take 0.5 mg po every 8 hours as needed for nausea/vomiting. Patient not taking: Reported on 06/16/2016 10/23/15   Truitt Merle, MD  potassium chloride SA (K-DUR,KLOR-CON) 20 MEQ tablet Take 1 tablet (20 mEq total) by mouth 2 (two) times daily. Patient not taking: Reported on 06/16/2016 02/05/16   Truitt Merle, MD    Physical Exam: Vitals:   06/16/16 1824 06/16/16 1828 06/16/16 2021 06/16/16 2215  BP:  (!) 141/104 (!) 154/115 113/90  Pulse:  107 102 115  Resp:  24 24 23   Temp:  97.6 F (36.4 C)  98.5 F (36.9 C)  TempSrc:  Oral  Oral  SpO2:  98% 99% 100%  Weight: 81.2 kg (179 lb)     Height: 5' 6"  (1.676 m)         Constitutional: Moderately built and nourished. Vitals:   06/16/16 1824 06/16/16 1828 06/16/16 2021 06/16/16 2215  BP:  (!) 141/104 (!) 154/115 113/90  Pulse:  107 102 115  Resp:  24 24 23   Temp:  97.6 F (36.4 C)  98.5 F (36.9 C)  TempSrc:  Oral  Oral  SpO2:  98% 99% 100%  Weight: 81.2 kg (179 lb)     Height: 5' 6"  (1.676 m)      Eyes: Anicteric no pallor. ENMT: No discharge from the ears eyes nose and mouth. Neck: JVD elevated no mass  felt. Respiratory: No rhonchi or crepitations. Cardiovascular: S1 and S2 heard no murmurs appreciated. Abdomen: Soft nontender bowel sounds present. No guarding or rigidity. Musculoskeletal: No edema. No joint effusion. Skin: No rash. Skin appears warm. Neurologic: Alert awake oriented to time place and person. Moves all extremities. Psychiatric: Appears normal. Normal affect.   Labs on Admission: I have personally reviewed following labs and imaging studies  CBC:  Recent Labs Lab 06/10/16 1049 06/16/16 1944  WBC 3.7* 3.9*  NEUTROABS 1.6 1.5*  HGB 12.1 12.6  HCT 39.8 41.5  MCV 74.8* 74.4*  PLT 285 458*   Basic Metabolic Panel:  Recent Labs Lab 06/10/16 1049 06/16/16 1944  NA 141 139  K 3.9 4.0  CL  --  106  CO2 24 23  GLUCOSE 96 124*  BUN 13.7 13  CREATININE 1.0 1.07*  CALCIUM 9.4 9.4   GFR: Estimated Creatinine Clearance: 63.1 mL/min (by C-G formula based on SCr of 1.07 mg/dL (H)). Liver Function Tests:  Recent Labs Lab 06/10/16 1049 06/16/16 1944  AST 19 21  ALT 15 34  ALKPHOS 93 68  BILITOT 0.74 0.9  PROT 7.2 7.2  ALBUMIN 3.7 4.3   No results for input(s): LIPASE, AMYLASE in the last 168 hours. No results for input(s): AMMONIA in the last 168 hours. Coagulation Profile: No results for input(s): INR, PROTIME in the last 168 hours. Cardiac Enzymes: No results for input(s): CKTOTAL, CKMB, CKMBINDEX, TROPONINI in the last 168 hours. BNP (last 3 results) No results for input(s): PROBNP in the last 8760 hours. HbA1C: No results for input(s): HGBA1C in the last 72 hours. CBG: No results for input(s): GLUCAP in the last 168 hours. Lipid Profile: No results for input(s): CHOL, HDL, LDLCALC, TRIG, CHOLHDL, LDLDIRECT in the last 72 hours. Thyroid Function Tests: No results for input(s): TSH, T4TOTAL, FREET4, T3FREE, THYROIDAB in the last 72 hours. Anemia Panel: No results for input(s): VITAMINB12, FOLATE, FERRITIN, TIBC, IRON, RETICCTPCT in the last 72  hours. Urine analysis:    Component Value Date/Time   COLORURINE YELLOW 03/17/2016 [place] 03/17/2016 2304   LABSPEC 1.006 03/17/2016 2304   PHURINE 5.0 03/17/2016 2304   GLUCOSEU NEGATIVE 03/17/2016 2304   HGBUR NEGATIVE 03/17/2016 2304   BILIRUBINUR NEGATIVE 03/17/2016 2304   KETONESUR NEGATIVE 03/17/2016 2304   PROTEINUR 30 05/27/2016 0828   NITRITE NEGATIVE 03/17/2016 2304   LEUKOCYTESUR TRACE (A) 03/17/2016 2304   Sepsis Labs: @LABRCNTIP (procalcitonin:4,lacticidven:4) ) Recent Results (from the past 240 hour(s))  Culture, blood (Routine X 2) w Reflex to ID Panel     Status: None (Preliminary result)   Collection Time: 06/16/16  8:06 PM  Result Value Ref Range Status   Specimen Description   Final    BLOOD PORTA CATH Performed at [facility]    Special Requests BOTTLES DRAWN AEROBIC AND ANAEROBIC 5CC  Final   Culture PENDING  Incomplete   Report Status PENDING  Incomplete     Radiological Exams on Admission: Dg Chest 2 View  Result Date: 06/16/2016 CLINICAL DATA:  Shortness of breath and nausea.  Chest pain EXAM: CHEST  2 VIEW COMPARISON:  April 08, 2015 FINDINGS: There is patchy consolidation in the posterior left base region. Lungs elsewhere clear. There is cardiomegaly with mild pulmonary venous hypertension. No adenopathy. Port-A-Cath tip is in the superior vena cava. No pneumothorax. No bone lesions. IMPRESSION: Posterior segment left lower lobe airspace consolidation consistent with pneumonia. Cardiomegaly with mild pulmonary venous hypertension consistent with a degree of pulmonary vascular congestion. No edema. Port-A-Cath tip in superior vena cava.  No pneumothorax. Followup PA and lateral chest radiographs recommended in 3-4 weeks following trial of antibiotic therapy to ensure resolution and exclude underlying malignancy. Electronically Signed   By: Lowella Grip III M.D.   On: 06/16/2016 18:59    EKG: Independently reviewed. Sinus  tachycardia poor R-wave progression.  Assessment/Plan Principal Problem:   Acute respiratory failure with hypoxia (HCC) Active Problems:   Iron deficiency anemia due to chronic blood loss   Hypertension   Metastatic colon cancer to liver [facility])   Community acquired pneumonia of left lower lobe of lung ([place])   CHF (congestive heart failure) ([place])    1. Acute  respiratory failure with hypoxia probably a combination of CHF and pneumonia - patient has been placed on Lasix 40 mg IV every 12 and on antibiotic ceftriaxone and Zithromax for CHF or pneumonia. Check urine for Legionella and strep and follow sputum cultures. Check 2-D echo. Patient is tachycardic and if persistent will get VQ scan. Closely follow intake output, metabolic panel and daily weights. 2. Hypertension on Procardia which will be continued. Closely follow blood pressure trends. 3. Acute renal failure - patient was just recently started on Lasix and patient also had episodes of vomiting. UA is pending. Follow metabolic panel closely. 4. Nausea vomiting may be related to patient's chemotherapy - abdomen appears benign. Recent CT scan of the abdomen did not show anything acute. If there is further episodes may consider further imaging. 5. Metastatic colon cancer per oncologist.   DVT prophylaxis: Lovenox. Code Status: Full code.  Family Communication: Discussed with patient.  Disposition Plan: Home.  Consults called: None.  Admission status: Inpatient. Likely stay 2 days.    Rise Patience MD [facility] Pager [phone].  If 7PM-7AM, please contact night-coverage [email or web address] Password [email or web address]  06/16/2016, 11:07 PM

## 2016-06-16 NOTE — ED Notes (Signed)
Patient provided with hospital bed.

## 2016-06-16 NOTE — ED Provider Notes (Addendum)
[facility] DEPT Provider Note   CSN: [id number] Arrival date & time: 06/16/16  1814     History   Chief Complaint Chief Complaint  Patient presents with  . Shortness of Breath  . Insomnia    HPI Christina [name] is a 55 y.o. female.  Patient is a 54 ry/o female with a history of hypertension, metastatic colon cancer to the liver currently on chemotherapy presenting today with a six-day history of worsening shortness of breath. Patient states earlier this week and over the weekend she had a cough that has improved but with a CT last week was diagnosed with bronchitis by her PCP as well as some effusions. Patient was started on Lasix which initially seemed to help but over the last few days she is feeling worse.  She is having worsening shortness of breath with lying down and with ambulation. She has felt some mild fullness in her abdomen but denies any lower extremity swelling. No prior cardiac history but states she did have the symptoms once several years ago with a bad bronchitis. She denies any current antibiotics.  However now she's starting to get some chest tightness as well as the shortness of breath with lying down. She denies any fever or productive cough at this time.   The history is provided by the patient.    Past Medical History:  Diagnosis Date  . Endometriosis   . Hypertension   . met colon ca to liver dx'd 03/2015  . Thalassanemia     Patient Active Problem List   Diagnosis Date Noted  . Port catheter in place 12/04/2015  . Chemotherapy induced neutropenia ([place]) 08/07/2015  . Rectal pain 07/02/2015  . Metastatic colon cancer to liver ([place]) 04/08/2015  . Hypertension   . Thalassanemia   . Nausea & vomiting 04/06/2015  .  Pelvic mass - 9cm - ?fibroid vs drop metastasis 04/06/2015  . Hypokalemia 04/06/2015  . Iron deficiency anemia due to chronic blood loss 04/06/2015    Past Surgical History:  Procedure Laterality Date  . COLON RESECTION N/A  04/08/2015   Procedure: LAPAROSCOPIC  RESECTION OF PART OF  SIGMOID COLON;  Surgeon: Michael Boston, MD;  Location: [facility];  Service: General;  Laterality: N/A;  . DIAGNOSTIC LAPAROSCOPY     Endometriosis  . LAPAROSCOPIC SIGMOID COLECTOMY  04/08/2015   for colorectal cancer  . LIVER BIOPSY N/A 04/08/2015   Procedure: CORE NEEDLE LIVER BIOPSY;  Surgeon: Michael Boston, MD;  Location: [facility];  Service: General;  Laterality: N/A;  . MYOMECTOMY     Gyn in [place]  . PORTACATH PLACEMENT N/A 04/08/2015   Procedure: INSERTION PORT-A-CATH;  Surgeon: Michael Boston, MD;  Location: [facility];  Service: General;  Laterality: N/A;    OB History    No data available       Home Medications    Prior to Admission medications   Medication Sig Start Date End Date Taking? Authorizing Provider  albuterol (VENTOLIN HFA) 108 (90 Base) MCG/ACT inhaler Inhale 2 puffs into the lungs every 6 (six) hours as needed for wheezing or shortness of breath. 06/12/16  Yes Truitt Merle, MD  dexamethasone (DECADRON) 4 MG tablet Take 1 tablet (4 mg total) by mouth 2 (two) times daily with a meal. Patient taking differently: Take 4 mg by mouth 2 (two) times daily as needed (nausea from chemotherapy).  07/17/15  Yes Truitt Merle, MD  diclofenac (VOLTAREN) 50 MG EC tablet Take 50 mg by mouth daily as needed for moderate  pain.    Yes Historical Provider, MD  furosemide (LASIX) 20 MG tablet Take 1 tablet (20 mg total) by mouth daily. 06/12/16  Yes Truitt Merle, MD  lidocaine-prilocaine (EMLA) cream Apply 1 application topically as needed. Patient taking differently: Apply 1 application topically daily as needed (port access).  11/20/15  Yes Truitt Merle, MD  NIFEdipine (PROCARDIA-XL/ADALAT CC) 30 MG 24 hr tablet Take 30 mg by mouth daily.   Yes Historical Provider, MD  ondansetron (ZOFRAN) 4 MG tablet Take 1 tablet (4 mg total) by mouth every 6 (six) hours as needed for nausea. 02/05/16  Yes Truitt Merle, MD  oxyCODONE (OXY IR/ROXICODONE) 5 MG immediate  release tablet Take 1 tablet (5 mg total) by mouth every 6 (six) hours as needed for moderate pain. 05/13/16  Yes Truitt Merle, MD  potassium chloride SA (K-DUR,KLOR-CON) 20 MEQ tablet Take 1 tablet (20 mEq total) by mouth daily. 06/12/16  Yes Truitt Merle, MD  prochlorperazine (COMPAZINE) 10 MG tablet Take 1 tablet (10 mg total) by mouth every 6 (six) hours as needed for nausea or vomiting. 03/24/16  Yes Chauncey Cruel, MD  cyclobenzaprine (FLEXERIL) 10 MG tablet Take 1 tablet (10 mg total) by mouth 2 (two) times daily as needed for muscle spasms. Patient not taking: Reported on 06/16/2016 03/18/16   Sherwood Gambler, MD  levofloxacin (LEVAQUIN) 500 MG tablet Take 500 mg by mouth daily.    Historical Provider, MD  LORazepam (ATIVAN) 0.5 MG tablet Take 1 tablet (0.5 mg total) by mouth every 8 (eight) hours. Take 0.5 mg po every 8 hours as needed for nausea/vomiting. Patient not taking: Reported on 06/16/2016 10/23/15   Truitt Merle, MD  potassium chloride SA (K-DUR,KLOR-CON) 20 MEQ tablet Take 1 tablet (20 mEq total) by mouth 2 (two) times daily. Patient not taking: Reported on 06/16/2016 02/05/16   Truitt Merle, MD    Family History Family History  Problem Relation Age of Onset  . Anesthesia problems Cousin 82    maternal cousin, colon cancer   . Clotting disorder Maternal Grandmother     Social History Social History  Substance Use Topics  . Smoking status: Never Smoker  . Smokeless tobacco: Never Used  . Alcohol use Yes     Comment: Once every four months.      Allergies   Lactose intolerance (gi); Lorazepam; and Milk-related compounds   Review of Systems Review of Systems  All other systems reviewed and are negative.    Physical Exam Updated Vital Signs BP (!) 141/104 (BP Location: Right Arm)   Pulse 107   Temp 97.6 F (36.4 C) (Oral)   Resp 24   Ht _0  (1.676 m)   Wt 179 lb (81.2 kg)   SpO2 98%   BMI 28.89 kg/m   Physical Exam  Constitutional: She is oriented to person, place,  and time. She appears well-developed and well-nourished. No distress.  HENT:  Head: Normocephalic and atraumatic.  Mouth/Throat: Oropharynx is clear and moist.  Eyes: Conjunctivae and EOM are normal. Pupils are equal, round, and reactive to light.  Neck: Normal range of motion. Neck supple.  Cardiovascular: Regular rhythm and intact distal pulses.  Tachycardia present.   No murmur heard. Pulmonary/Chest: Effort normal and breath sounds normal. No respiratory distress. She has no wheezes. She has no rales. She exhibits no tenderness.  Abdominal: Soft. She exhibits no distension. There is tenderness. There is no rebound and no guarding.  Mild right upper quadrant tenderness. No significant distention  Musculoskeletal: Normal range of motion. She exhibits no edema or tenderness.  Neurological: She is alert and oriented to person, place, and time.  Skin: Skin is warm and dry. No rash noted. No erythema.  Psychiatric: She has a normal mood and affect. Her behavior is normal.  Nursing note and vitals reviewed.    ED Treatments / Results  Labs (all labs ordered are listed, but only abnormal results are displayed) Labs Reviewed  CBC WITH DIFFERENTIAL/PLATELET - Abnormal; Notable for the following:       Result Value   WBC 3.9 (*)    RBC 5.58 (*)    MCV 74.4 (*)    MCH 22.6 (*)    RDW 19.4 (*)    Platelets 443 (*)    Neutro Abs 1.5 (*)    All other components within normal limits  COMPREHENSIVE METABOLIC PANEL - Abnormal; Notable for the following:    Glucose, Bld 124 (*)    Creatinine, Ser 1.07 (*)    GFR calc non Af Amer 57 (*)    All other components within normal limits  CULTURE, BLOOD (ROUTINE X 2)  CULTURE, BLOOD (ROUTINE X 2)  BRAIN NATRIURETIC PEPTIDE  I-STAT TROPOININ, ED  I-STAT CG4 LACTIC ACID, ED    EKG  EKG Interpretation  Date/Time:  Wednesday June 16 2016 18:35:33 EST Ventricular Rate:  104 PR Interval:    QRS Duration: 80 QT Interval:  369 QTC  Calculation: 486 R Axis:   43 Text Interpretation:  Sinus tachycardia Probable left atrial enlargement Probable anterior infarct, age indeterminate No old tracing to compare Confirmed by Winfred Leeds  MD, [place] [phone]) on 06/16/2016 6:42:44 PM       Radiology Dg Chest 2 View  Result Date: 06/16/2016 CLINICAL DATA:  Shortness of breath and nausea.  Chest pain EXAM: CHEST  2 VIEW COMPARISON:  April 08, 2015 FINDINGS: There is patchy consolidation in the posterior left base region. Lungs elsewhere clear. There is cardiomegaly with mild pulmonary venous hypertension. No adenopathy. Port-A-Cath tip is in the superior vena cava. No pneumothorax. No bone lesions. IMPRESSION: Posterior segment left lower lobe airspace consolidation consistent with pneumonia. Cardiomegaly with mild pulmonary venous hypertension consistent with a degree of pulmonary vascular congestion. No edema. Port-A-Cath tip in superior vena cava.  No pneumothorax. Followup PA and lateral chest radiographs recommended in 3-4 weeks following trial of antibiotic therapy to ensure resolution and exclude underlying malignancy. Electronically Signed   By: Lowella Grip III M.D.   On: 06/16/2016 18:59    Procedures Procedures (including critical care time)  Medications Ordered in ED Medications  cefTRIAXone (ROCEPHIN) 1 g in dextrose 5 % 50 mL IVPB (not administered)  azithromycin (ZITHROMAX) 500 mg in dextrose 5 % 250 mL IVPB (not administered)     Initial Impression / Assessment and Plan / ED Course  I have reviewed the triage vital signs and the nursing notes.  Pertinent labs & imaging results that were available during my care of the patient were reviewed by me and considered in my medical decision making (see chart for details).  Clinical Course    Patient is a 55 y/o female presenting today with worsening shortness of breath. It is most pronounced with lying flat and ambulation. She had a CT done last week that showed pleural  effusion, bronchitis and signs of pneumonia at that time. Patient has no prior history of CHF that was placed on Lasix. She initially thought it was getting a little better but worse  on Monday. She has not had any pronounced cough now and denies any fevers.  At baseline she denies any shortness of breath.  She is currently denying shortness of breath at rest.  Chest x-ray today shows concern for a posterior segment left lower lobe pneumonia with cardiomegaly and mild pulmonary venous hypertension and pulmonary vascular congestion.  Patient started on Rocephin and azithromycin for community-acquired pneumonia. CBC, CMP, troponin, BNP pending.  8:26 PM Labs without acute findings.  BNP pending.  Will admit for further care.  Final Clinical Impressions(s) / ED Diagnoses   Final diagnoses:  Community acquired pneumonia of left lower lobe of lung ([place])  Cardiomegaly    New Prescriptions New Prescriptions   No medications on file     Blanchie Dessert, MD 06/16/16 2027    Blanchie Dessert, MD 06/16/16 2031

## 2016-06-17 ENCOUNTER — Inpatient Hospital Stay (HOSPITAL_COMMUNITY): Payer: BLUE CROSS/BLUE SHIELD

## 2016-06-17 ENCOUNTER — Telehealth: Payer: Self-pay | Admitting: *Deleted

## 2016-06-17 ENCOUNTER — Encounter (HOSPITAL_COMMUNITY): Payer: Self-pay | Admitting: Family Medicine

## 2016-06-17 DIAGNOSIS — D6481 Anemia due to antineoplastic chemotherapy: Secondary | ICD-10-CM

## 2016-06-17 DIAGNOSIS — J181 Lobar pneumonia, unspecified organism: Secondary | ICD-10-CM

## 2016-06-17 DIAGNOSIS — I509 Heart failure, unspecified: Secondary | ICD-10-CM

## 2016-06-17 DIAGNOSIS — I5023 Acute on chronic systolic (congestive) heart failure: Secondary | ICD-10-CM | POA: Diagnosis present

## 2016-06-17 DIAGNOSIS — D500 Iron deficiency anemia secondary to blood loss (chronic): Secondary | ICD-10-CM

## 2016-06-17 DIAGNOSIS — J9601 Acute respiratory failure with hypoxia: Secondary | ICD-10-CM

## 2016-06-17 DIAGNOSIS — I420 Dilated cardiomyopathy: Secondary | ICD-10-CM | POA: Diagnosis present

## 2016-06-17 DIAGNOSIS — J189 Pneumonia, unspecified organism: Secondary | ICD-10-CM

## 2016-06-17 DIAGNOSIS — I10 Essential (primary) hypertension: Secondary | ICD-10-CM

## 2016-06-17 DIAGNOSIS — C189 Malignant neoplasm of colon, unspecified: Secondary | ICD-10-CM

## 2016-06-17 LAB — CBC
HEMATOCRIT: 39 % (ref 36.0–46.0)
Hemoglobin: 11.7 g/dL — ABNORMAL LOW (ref 12.0–15.0)
MCH: 22.4 pg — ABNORMAL LOW (ref 26.0–34.0)
MCHC: 30 g/dL (ref 30.0–36.0)
MCV: 74.6 fL — ABNORMAL LOW (ref 78.0–100.0)
PLATELETS: 411 10*3/uL — AB (ref 150–400)
RBC: 5.23 MIL/uL — ABNORMAL HIGH (ref 3.87–5.11)
RDW: 19.4 % — AB (ref 11.5–15.5)
WBC: 3.5 10*3/uL — AB (ref 4.0–10.5)

## 2016-06-17 LAB — BASIC METABOLIC PANEL
Anion gap: 9 (ref 5–15)
BUN: 13 mg/dL (ref 6–20)
CALCIUM: 8.9 mg/dL (ref 8.9–10.3)
CO2: 25 mmol/L (ref 22–32)
CREATININE: 1.25 mg/dL — AB (ref 0.44–1.00)
Chloride: 106 mmol/L (ref 101–111)
GFR calc Af Amer: 55 mL/min — ABNORMAL LOW (ref >60)
GFR, EST NON AFRICAN AMERICAN: 47 mL/min — AB (ref >60)
GLUCOSE: 107 mg/dL — AB (ref 65–99)
POTASSIUM: 3.6 mmol/L (ref 3.5–5.1)
SODIUM: 140 mmol/L (ref 135–145)

## 2016-06-17 LAB — ECHOCARDIOGRAM COMPLETE
HEIGHTINCHES: 66 in
WEIGHTICAEL: 2779.56 [oz_av]

## 2016-06-17 LAB — DIFFERENTIAL
BASOS ABS: 0 10*3/uL (ref 0.0–0.1)
BASOS PCT: 0 %
EOS ABS: 0 10*3/uL (ref 0.0–0.7)
Eosinophils Relative: 1 %
LYMPHS ABS: 1.7 10*3/uL (ref 0.7–4.0)
Lymphocytes Relative: 49 %
MONOS PCT: 10 %
Monocytes Absolute: 0.4 10*3/uL (ref 0.1–1.0)
NEUTROS ABS: 1.4 10*3/uL — AB (ref 1.7–7.7)
NEUTROS PCT: 40 %

## 2016-06-17 LAB — STREP PNEUMONIAE URINARY ANTIGEN: STREP PNEUMO URINARY ANTIGEN: POSITIVE — AB

## 2016-06-17 LAB — MRSA PCR SCREENING: MRSA by PCR: NEGATIVE

## 2016-06-17 LAB — GLUCOSE, CAPILLARY: Glucose-Capillary: 235 mg/dL — ABNORMAL HIGH (ref 65–99)

## 2016-06-17 MED ORDER — TECHNETIUM TO 99M ALBUMIN AGGREGATED
4.0000 | Freq: Once | INTRAVENOUS | Status: AC | PRN
  Administered 2016-06-17: 4 via INTRAVENOUS

## 2016-06-17 MED ORDER — HYDRALAZINE HCL 20 MG/ML IJ SOLN: 10.0000 mg | INTRAMUSCULAR | Status: DC | PRN

## 2016-06-17 MED ORDER — ALPRAZOLAM 0.25 MG PO TABS
0.2500 mg | ORAL_TABLET | Freq: Three times a day (TID) | ORAL | Status: DC | PRN
  Administered 2016-06-17: 0.25 mg via ORAL
  Filled 2016-06-17: qty 1

## 2016-06-17 MED ORDER — SODIUM CHLORIDE 0.9% FLUSH
10.0000 mL | INTRAVENOUS | Status: DC | PRN
  Administered 2016-06-20: 10 mL
  Filled 2016-06-17: qty 40

## 2016-06-17 MED ORDER — CARVEDILOL 3.125 MG PO TABS
3.1250 mg | ORAL_TABLET | Freq: Two times a day (BID) | ORAL | Status: DC
  Administered 2016-06-17: 3.125 mg via ORAL
  Filled 2016-06-17: qty 1

## 2016-06-17 MED ORDER — ALBUTEROL SULFATE (2.5 MG/3ML) 0.083% IN NEBU: 2.5000 mg | INHALATION_SOLUTION | Freq: Four times a day (QID) | RESPIRATORY_TRACT | Status: DC | PRN

## 2016-06-17 MED ORDER — TECHNETIUM TC 99M DIETHYLENETRIAME-PENTAACETIC ACID: 30.2000 | Freq: Once | INTRAVENOUS | Status: DC | PRN

## 2016-06-17 MED ORDER — ALPRAZOLAM 0.25 MG PO TABS
0.2500 mg | ORAL_TABLET | Freq: Once | ORAL | Status: AC
  Administered 2016-06-17: 0.25 mg via ORAL
  Filled 2016-06-17: qty 1

## 2016-06-17 NOTE — Progress Notes (Signed)
Christina [name]   DOB:[birth year]   [id number]   [id number]  ONCOLOGY FOLLOW UP   Subjective: Pt is well know to me, under my care for her metastatic colon cancer, last chemo was on 06/10/2016. She has developed URI symptoms since about two weeks ago, with facial congestion and dry cough, no fever. She developed dyspnea on exertion since last weekend, I called in Lasix and albuterol, and her symptom got progressively worse this week, was admitted to [facility] yesterday for pneumonia and hypoxic respiratory failure. She overall feels much better today after IV antibiotics, still not able to lay flat, or leg swelling has improved.   Objective:  Vitals:   06/17/16 1945 06/17/16 2007  BP: 95/60   Pulse: 91   Resp: (!) 31   Temp:  (!) 96.3 F (35.7 C)    Body mass index is 28.04 kg/m.  Intake/Output Summary (Last 24 hours) at 06/17/16 2023 Last data filed at 06/17/16 1839  Gross per 24 hour  Intake              400 ml  Output             1800 ml  Net            -1400 ml     Sclerae unicteric  Oropharynx clear  No peripheral adenopathy  Lungs clear -- no rales or rhonchi, decreased breathing sound at the left lung base   Heart regular rate and rhythm  Abdomen benign  MSK no focal spinal tenderness, no peripheral edema  Neuro nonfocal    CBG (last 3)   Recent Labs  06/17/16 1947  GLUCAP 235*     Labs:  Lab Results  Component Value Date   WBC 3.5 (L) 06/17/2016   HGB 11.7 (L) 06/17/2016   HCT 39.0 06/17/2016   MCV 74.6 (L) 06/17/2016   PLT 411 (H) 06/17/2016   NEUTROABS 1.4 (L) 06/17/2016   CMP Latest Ref Rng & Units 06/17/2016 06/16/2016 06/16/2016  Glucose 65 - 99 mg/dL 107(H) - 124(H)  BUN 6 - 20 mg/dL 13 - 13  Creatinine 0.44 - 1.00 mg/dL 1.25(H) 1.27(H) 1.07(H)  Sodium 135 - 145 mmol/L 140 - 139  Potassium 3.5 - 5.1 mmol/L 3.6 - 4.0  Chloride 101 - 111 mmol/L 106 - 106  CO2 22 - 32 mmol/L 25 - 23  Calcium 8.9 - 10.3 mg/dL 8.9 - 9.4  Total  Protein 6.5 - 8.1 g/dL - - 7.2  Total Bilirubin 0.3 - 1.2 mg/dL - - 0.9  Alkaline Phos 38 - 126 U/L - - 68  AST 15 - 41 U/L - - 21  ALT 14 - 54 U/L - - 34     Urine Studies No results for input(s): UHGB, CRYS in the last 72 hours.  Invalid input(s): UACOL, UAPR, USPG, UPH, UTP, UGL, UKET, UBIL, UNIT, UROB, ULEU, UEPI, UWBC, URBC, UBAC, CAST, UCOM, [place]  Basic Metabolic Panel:  Recent Labs Lab 06/16/16 1944 06/16/16 2326 06/17/16 0321  NA 139  --  140  K 4.0  --  3.6  CL 106  --  106  CO2 23  --  25  GLUCOSE 124*  --  107*  BUN 13  --  13  CREATININE 1.07* 1.27* 1.25*  CALCIUM 9.4  --  8.9   GFR Estimated Creatinine Clearance: 53.2 mL/min (by C-G formula based on SCr of 1.25 mg/dL (H)). Liver Function Tests:  Recent Labs Lab 06/16/16 1944  AST  21  ALT 34  ALKPHOS 68  BILITOT 0.9  PROT 7.2  ALBUMIN 4.3   No results for input(s): LIPASE, AMYLASE in the last 168 hours. No results for input(s): AMMONIA in the last 168 hours. Coagulation profile No results for input(s): INR, PROTIME in the last 168 hours.  CBC:  Recent Labs Lab 06/16/16 1944 06/16/16 2326 06/17/16 0321  WBC 3.9* 5.1 3.5*  NEUTROABS 1.5*  --  1.4*  HGB 12.6 12.3 11.7*  HCT 41.5 40.6 39.0  MCV 74.4* 74.4* 74.6*  PLT 443* 478* 411*   Cardiac Enzymes: No results for input(s): CKTOTAL, CKMB, CKMBINDEX, TROPONINI in the last 168 hours. BNP: Invalid input(s): POCBNP CBG:  Recent Labs Lab 06/17/16 1947  GLUCAP 235*   D-Dimer No results for input(s): DDIMER in the last 72 hours. Hgb A1c No results for input(s): HGBA1C in the last 72 hours. Lipid Profile No results for input(s): CHOL, HDL, LDLCALC, TRIG, CHOLHDL, LDLDIRECT in the last 72 hours. Thyroid function studies No results for input(s): TSH, T4TOTAL, T3FREE, THYROIDAB in the last 72 hours.  Invalid input(s): FREET3 Anemia work up No results for input(s): VITAMINB12, FOLATE, FERRITIN, TIBC, IRON, RETICCTPCT in the last 72  hours. Microbiology Recent Results (from the past 240 hour(s))  Culture, blood (Routine X 2) w Reflex to ID Panel     Status: None (Preliminary result)   Collection Time: 06/16/16  7:50 PM  Result Value Ref Range Status   Specimen Description BLOOD RIGHT HAND  Final   Special Requests BOTTLES DRAWN AEROBIC AND ANAEROBIC 5CC  Final   Culture   Final    NO GROWTH < 24 HOURS Performed at [facility]    Report Status PENDING  Incomplete  Culture, blood (Routine X 2) w Reflex to ID Panel     Status: None (Preliminary result)   Collection Time: 06/16/16  8:06 PM  Result Value Ref Range Status   Specimen Description BLOOD PORTA CATH  Final   Special Requests BOTTLES DRAWN AEROBIC AND ANAEROBIC 5CC  Final   Culture   Final    NO GROWTH < 24 HOURS Performed at [facility]    Report Status PENDING  Incomplete  MRSA PCR Screening     Status: None   Collection Time: 06/17/16  3:58 AM  Result Value Ref Range Status   MRSA by PCR NEGATIVE NEGATIVE Final    Comment:        The GeneXpert MRSA Assay (FDA approved for NASAL specimens only), is one component of a comprehensive MRSA colonization surveillance program. It is not intended to diagnose MRSA infection nor to guide or monitor treatment for MRSA infections.       Studies:  Dg Chest 2 View  Result Date: 06/16/2016 CLINICAL DATA:  Shortness of breath and nausea.  Chest pain EXAM: CHEST  2 VIEW COMPARISON:  April 08, 2015 FINDINGS: There is patchy consolidation in the posterior left base region. Lungs elsewhere clear. There is cardiomegaly with mild pulmonary venous hypertension. No adenopathy. Port-A-Cath tip is in the superior vena cava. No pneumothorax. No bone lesions. IMPRESSION: Posterior segment left lower lobe airspace consolidation consistent with pneumonia. Cardiomegaly with mild pulmonary venous hypertension consistent with a degree of pulmonary vascular congestion. No edema. Port-A-Cath tip in superior vena  cava.  No pneumothorax. Followup PA and lateral chest radiographs recommended in 3-4 weeks following trial of antibiotic therapy to ensure resolution and exclude underlying malignancy. Electronically Signed   By: Lowella Grip III  M.D.   On: 06/16/2016 18:59   Nm Pulmonary Perf And Vent  Result Date: 06/17/2016 CLINICAL DATA:  Tachycardia.  Shortness of breath. EXAM: NUCLEAR MEDICINE VENTILATION - PERFUSION LUNG SCAN TECHNIQUE: Ventilation images were obtained in multiple projections using inhaled aerosol Tc-60m DTPA. Perfusion images were obtained in multiple projections after intravenous injection of Tc-60m MAA. RADIOPHARMACEUTICALS:  30.2 mCi Technetium-30m DTPA aerosol inhalation and 4.0 mCi Technetium-57m MAA IV COMPARISON:  CT 06/11/2016. FINDINGS: No ventilation perfusion mismatches.  Tiny patchy matched defects. IMPRESSION: Low probability pulmonary embolus. Electronically Signed   By: Marcello Moores  Register   On: 06/17/2016 15:13    Assessment: 55 y.o. African-American female, with metastatic colon cancer to liver, on chemotherapy, percentage of his worsening cough, dyspnea on exertion, orthopnea and leg swelling   1. Healthcare associated left pneumonia 2. Congestive heart failure 3. HTN 4. AKI, secondary to diuretics and sepsis  5. Metastatic colon cancer 6. Iron deficient anemia and beta thalassemia trait 7. Chemotherapy-induced anemia  Plan:  -she condition has improved after iv antibiotics and lasix  -ECHO result reviewed after I saw her today, she has severe systolic and diastolic heart failure, cardiology consult is pending. She had no prior history of heart failure, but has hypertension, chronic anemia, which can contribute to heart failure. Systemic chemotherapy induced heart failure, and acute virus infection induced heart cardiomyopathy are also possible. Await for cardiology evaluation and management -will hold on next cycle chemo for now, until she recovers well  -will follow  along, appreciate the excellent care from the hospitalist team    Truitt Merle, MD 06/17/2016

## 2016-06-17 NOTE — Care Management Note (Signed)
Case Management Note  Patient Details  Name: Christina [name] MRN: [id number] Date of Birth: February 16, 1961  Subjective/Objective:     sepsis              Action/Plan:  Home Date:  June 17, 2016 Chart reviewed for concurrent status and case management needs. Will continue to follow the patient for status change: Discharge Planning: following for needs Expected discharge date: [id number] Velva Harman, BSN, [place], [place]   Expected Discharge Date:                  Expected Discharge Plan:  Home/Self Care  In-House Referral:     Discharge planning Services     Post Acute Care Choice:    Choice offered to:     DME Arranged:    DME Agency:     HH Arranged:    Callender Agency:     Status of Service:  In process, will continue to follow  If discussed at Long Length of Stay Meetings, dates discussed:    Additional Comments:  Leeroy Cha, RN 06/17/2016, 10:30 AM

## 2016-06-17 NOTE — Progress Notes (Signed)
06/17/2016 4:16 PM  Update:  I discussed results of echocardiogram with patient and her husband and consulted cardiologist to see in AM.  They verbalized understanding.  Adding low dose coreg to regimen.    Murvin Natal, MD

## 2016-06-17 NOTE — Progress Notes (Signed)
At approximately 1930, pt called and stated that she was feeling like her BP was low (75/57), and she was diaphoretic and very lethargic. MD was paged. Pt was given a 500 cc bolus. BP is now WNL at 119/94. Pt is no longer lethargic, A&Ox4, and no longer diaphoretic.  Will continue to monitor

## 2016-06-17 NOTE — Progress Notes (Signed)
  Echocardiogram 2D Echocardiogram has been performed.  Aggie Cosier 06/17/2016, 2:09 PM

## 2016-06-17 NOTE — Telephone Encounter (Addendum)
Pt. Called and just wanted to make Dr.Feng aware that she was currently admitted here @ [facility]

## 2016-06-17 NOTE — Progress Notes (Signed)
PROGRESS NOTE    Christina [name]  [id number]  DOB: Apr 21, 1961  DOA: 06/16/2016 PCP: Benito Mccreedy, MD Outpatient Specialists:  Dr. Alexis Goodell course: HPI: Christina [name] is a 55 y.o. female with metastatic colon cancer on chemotherapy, hypertension has been recently experiencing shortness of breath. Patient has been having symptoms of bronchitis for last 3 weeks. Patient's oncologist had CT chest and abdomen 5 days ago which showed bilateral pleural effusion. Patient eventually started developing orthopnea and paroxysmal dyspnea and was started on Lasix 4 days ago orally daily. Despite taking which patient was progressively getting short of breath and presented to the ER. Chest x-ray shows pneumonic process. BNP is markedly elevated. Patient was started on antibiotics and Lasix 40 mg IV was given in the ER. Patient is being admitted for acute respiratory failure probably combination of CHF and pneumonia. Patient denies any chest pain. On exam patient does have mildly elevated JVD. Patient also had 2 episodes of vomiting in the ER. Abdomen appears benign.    Assessment & Plan:   1. Acute respiratory failure with hypoxia probably a combination of CHF and pneumonia - patient has been placed on Lasix 40 mg IV every 12 and on antibiotic ceftriaxone and Zithromax for CHF or pneumonia. Check urine for Legionella and strep and follow sputum cultures. Check 2-D echo. Patient is tachycardic and if persistent will get VQ scan. Closely follow intake output, metabolic panel and daily weights. 2. Hypertension on Procardia which will be continued. Closely follow blood pressure trends.  Hydralazine prn.  3. Acute renal failure - patient was just recently started on Lasix and patient also had episodes of vomiting. UA is pending. Follow metabolic panel closely. 4. Nausea vomiting may be related to patient's chemotherapy - abdomen appears benign. Recent CT scan of the abdomen did not show  anything acute. If there is further episodes may consider further imaging. 5. Metastatic colon cancer per oncologist.   DVT prophylaxis: Lovenox. Code Status: Full code.  Family Communication: Discussed with patient.  Disposition Plan: Home.  Consults called: None.  Admission status: Inpatient. Likely stay 2 days.   Subjective: Pt diuresing with IV lasix.   Objective: Vitals:   06/17/16 0407 06/17/16 0411 06/17/16 0500 06/17/16 0730  BP:  (!) 135/98  (!) 152/106  Pulse:  (!) 103  (!) 101  Resp:  (!) 26  19  Temp: 97.4 F (36.3 C)     TempSrc: Oral     SpO2:  (!) 88%  97%  Weight:   78.8 kg (173 lb 11.6 oz)   Height:        Intake/Output Summary (Last 24 hours) at 06/17/16 0801 Last data filed at 06/17/16 0300  Gross per 24 hour  Intake                0 ml  Output             1050 ml  Net            -1050 ml   Filed Weights   06/16/16 1824 06/17/16 0046 06/17/16 0500  Weight: 81.2 kg (179 lb) 78.8 kg (173 lb 11.6 oz) 78.8 kg (173 lb 11.6 oz)    Exam:  Eyes: Anicteric no pallor. ENMT: No discharge from the ears eyes nose and mouth. Neck: JVD elevated no mass felt. Respiratory: No rhonchi or crepitations. Cardiovascular: S1 and S2 heard no murmurs appreciated. Abdomen: Soft nontender bowel sounds present. No guarding or rigidity. Musculoskeletal: No edema. No  joint effusion. Skin: No rash. Skin appears warm. Neurologic: Alert awake oriented to time place and person. Moves all extremities. Psychiatric: Appears normal. Normal affect.  Data Reviewed: Basic Metabolic Panel:  Recent Labs Lab 06/10/16 1049 06/16/16 1944 06/16/16 2326 06/17/16 0321  NA 141 139  --  140  K 3.9 4.0  --  3.6  CL  --  106  --  106  CO2 24 23  --  25  GLUCOSE 96 124*  --  107*  BUN 13.7 13  --  13  CREATININE 1.0 1.07* 1.27* 1.25*  CALCIUM 9.4 9.4  --  8.9   Liver Function Tests:  Recent Labs Lab 06/10/16 1049 06/16/16 1944  AST 19 21  ALT 15 34  ALKPHOS 93 68    BILITOT 0.74 0.9  PROT 7.2 7.2  ALBUMIN 3.7 4.3   No results for input(s): LIPASE, AMYLASE in the last 168 hours. No results for input(s): AMMONIA in the last 168 hours. CBC:  Recent Labs Lab 06/10/16 1049 06/16/16 1944 06/16/16 2326 06/17/16 0321  WBC 3.7* 3.9* 5.1 3.5*  NEUTROABS 1.6 1.5*  --   --   HGB 12.1 12.6 12.3 11.7*  HCT 39.8 41.5 40.6 39.0  MCV 74.8* 74.4* 74.4* 74.6*  PLT 285 443* 478* 411*   Cardiac Enzymes: No results for input(s): CKTOTAL, CKMB, CKMBINDEX, TROPONINI in the last 168 hours. CBG (last 3)  No results for input(s): GLUCAP in the last 72 hours. Recent Results (from the past 240 hour(s))  Culture, blood (Routine X 2) w Reflex to ID Panel     Status: None (Preliminary result)   Collection Time: 06/16/16  8:06 PM  Result Value Ref Range Status   Specimen Description   Final    BLOOD PORTA CATH Performed at [facility]    Special Requests BOTTLES DRAWN AEROBIC AND ANAEROBIC 5CC  Final   Culture PENDING  Incomplete   Report Status PENDING  Incomplete  MRSA PCR Screening     Status: None   Collection Time: 06/17/16  3:58 AM  Result Value Ref Range Status   MRSA by PCR NEGATIVE NEGATIVE Final    Comment:        The GeneXpert MRSA Assay (FDA approved for NASAL specimens only), is one component of a comprehensive MRSA colonization surveillance program. It is not intended to diagnose MRSA infection nor to guide or monitor treatment for MRSA infections.      Studies: Dg Chest 2 View  Result Date: 06/16/2016 CLINICAL DATA:  Shortness of breath and nausea.  Chest pain EXAM: CHEST  2 VIEW COMPARISON:  April 08, 2015 FINDINGS: There is patchy consolidation in the posterior left base region. Lungs elsewhere clear. There is cardiomegaly with mild pulmonary venous hypertension. No adenopathy. Port-A-Cath tip is in the superior vena cava. No pneumothorax. No bone lesions. IMPRESSION: Posterior segment left lower lobe airspace consolidation  consistent with pneumonia. Cardiomegaly with mild pulmonary venous hypertension consistent with a degree of pulmonary vascular congestion. No edema. Port-A-Cath tip in superior vena cava.  No pneumothorax. Followup PA and lateral chest radiographs recommended in 3-4 weeks following trial of antibiotic therapy to ensure resolution and exclude underlying malignancy. Electronically Signed   By: Lowella Grip III M.D.   On: 06/16/2016 18:59     Scheduled Meds: . azithromycin  500 mg Intravenous Q24H  . cefTRIAXone (ROCEPHIN)  IV  1 g Intravenous Q24H  . enoxaparin (LOVENOX) injection  40 mg Subcutaneous QHS  . furosemide  40 mg Intravenous Q12H  . NIFEdipine  30 mg Oral Daily  . potassium chloride SA  20 mEq Oral Daily  . sodium chloride flush  3 mL Intravenous Q12H   Continuous Infusions:  Principal Problem:   Acute respiratory failure with hypoxia (HCC) Active Problems:   Iron deficiency anemia due to chronic blood loss   Hypertension   Metastatic colon cancer to liver [facility])   Community acquired pneumonia of left lower lobe of lung (HCC)   CHF (congestive heart failure) ([place])  Critical Care Time spent: 35 mins  Irwin Brakeman, MD, FAAFP [facility] Pager [phone]  If 7PM-7AM, please contact night-coverage [email or web address] Password [email or web address] 06/17/2016, 8:01 AM    LOS: 1 day

## 2016-06-18 ENCOUNTER — Ambulatory Visit (HOSPITAL_COMMUNITY): Payer: BLUE CROSS/BLUE SHIELD

## 2016-06-18 DIAGNOSIS — C787 Secondary malignant neoplasm of liver and intrahepatic bile duct: Secondary | ICD-10-CM

## 2016-06-18 DIAGNOSIS — I5043 Acute on chronic combined systolic (congestive) and diastolic (congestive) heart failure: Secondary | ICD-10-CM

## 2016-06-18 DIAGNOSIS — I5023 Acute on chronic systolic (congestive) heart failure: Secondary | ICD-10-CM

## 2016-06-18 LAB — BRAIN NATRIURETIC PEPTIDE: B Natriuretic Peptide: 1865.4 pg/mL — ABNORMAL HIGH (ref 0.0–100.0)

## 2016-06-18 LAB — COMPREHENSIVE METABOLIC PANEL
ALK PHOS: 61 U/L (ref 38–126)
ALT: 31 U/L (ref 14–54)
AST: 31 U/L (ref 15–41)
Albumin: 3.7 g/dL (ref 3.5–5.0)
Anion gap: 11 (ref 5–15)
BUN: 17 mg/dL (ref 6–20)
CALCIUM: 8.9 mg/dL (ref 8.9–10.3)
CO2: 27 mmol/L (ref 22–32)
CREATININE: 1.47 mg/dL — AB (ref 0.44–1.00)
Chloride: 103 mmol/L (ref 101–111)
GFR, EST AFRICAN AMERICAN: 45 mL/min — AB (ref >60)
GFR, EST NON AFRICAN AMERICAN: 39 mL/min — AB (ref >60)
Glucose, Bld: 112 mg/dL — ABNORMAL HIGH (ref 65–99)
Potassium: 3.2 mmol/L — ABNORMAL LOW (ref 3.5–5.1)
SODIUM: 141 mmol/L (ref 135–145)
Total Bilirubin: 0.5 mg/dL (ref 0.3–1.2)
Total Protein: 6.2 g/dL — ABNORMAL LOW (ref 6.5–8.1)

## 2016-06-18 MED ORDER — ISOSORBIDE MONONITRATE ER 30 MG PO TB24
15.0000 mg | ORAL_TABLET | Freq: Every day | ORAL | Status: DC
  Administered 2016-06-18: 15 mg via ORAL
  Filled 2016-06-18: qty 1

## 2016-06-18 MED ORDER — HYDRALAZINE HCL 10 MG PO TABS
10.0000 mg | ORAL_TABLET | Freq: Three times a day (TID) | ORAL | Status: DC
  Administered 2016-06-18 – 2016-06-20 (×3): 10 mg via ORAL
  Filled 2016-06-18 (×7): qty 1

## 2016-06-18 MED ORDER — DOXYCYCLINE HYCLATE 100 MG PO TABS
100.0000 mg | ORAL_TABLET | Freq: Two times a day (BID) | ORAL | Status: DC
  Administered 2016-06-19 – 2016-06-20 (×3): 100 mg via ORAL
  Filled 2016-06-18 (×3): qty 1

## 2016-06-18 MED ORDER — FUROSEMIDE 10 MG/ML IJ SOLN
40.0000 mg | Freq: Every day | INTRAMUSCULAR | Status: DC
  Administered 2016-06-19 – 2016-06-20 (×2): 40 mg via INTRAVENOUS
  Filled 2016-06-18 (×2): qty 4

## 2016-06-18 MED ORDER — POTASSIUM CHLORIDE CRYS ER 20 MEQ PO TBCR
40.0000 meq | EXTENDED_RELEASE_TABLET | Freq: Two times a day (BID) | ORAL | Status: DC
Start: 2016-06-18 — End: 2016-06-20
  Administered 2016-06-18 – 2016-06-20 (×5): 40 meq via ORAL
  Filled 2016-06-18 (×5): qty 2

## 2016-06-18 MED ORDER — CARVEDILOL 3.125 MG PO TABS
3.1250 mg | ORAL_TABLET | Freq: Two times a day (BID) | ORAL | Status: DC
  Administered 2016-06-18 – 2016-06-20 (×5): 3.125 mg via ORAL
  Filled 2016-06-18 (×5): qty 1

## 2016-06-18 NOTE — Progress Notes (Signed)
PROGRESS NOTE    Christina [name]  [id number]  DOB: [birth year]  DOA: 06/16/2016 PCP: Benito Mccreedy, MD Outpatient Specialists:  Dr. Alexis Goodell course: HPI: Christina [name] is a 55 y.o. female with metastatic colon cancer on chemotherapy, hypertension has been recently experiencing shortness of breath. Patient has been having symptoms of bronchitis for last 3 weeks. Patient's oncologist had CT chest and abdomen 5 days ago which showed bilateral pleural effusion. Patient eventually started developing orthopnea and paroxysmal dyspnea and was started on Lasix 4 days ago orally daily. Despite taking which patient was progressively getting short of breath and presented to the ER. Chest x-ray shows pneumonic process. BNP is markedly elevated. Patient was started on antibiotics and Lasix 40 mg IV was given in the ER. Patient is being admitted for acute respiratory failure probably combination of CHF and pneumonia. Patient denies any chest pain. On exam patient does have mildly elevated JVD. Patient also had 2 episodes of vomiting in the ER. Abdomen appears benign.   Assessment & Plan:   1. Acute respiratory failure with hypoxia probably a combination of CHF and pneumonia - patient has been placed on Lasix 40 mg IV every 12 and on antibiotic ceftriaxone and Zithromax for CHF or pneumonia.  Clinically patient feeling better but still has DOE and orthopnea.   2. Cardiomyopathy / Acute on Chronic Systolic CHF - Appreciate cardiology consultation, will defer further med mgmt to cardiology team as patient had episode of hypotension overnight requiring bolus of IVFs.  Holding coreg for now.  See cardiology recommendations.   3. Hypertension - Holding BP meds this morning as she had episode of hypotension last night.    4. Acute renal failure - suspect related to poor renal perfusion from systolic heart failure and diuresis, following. 5. Nausea vomiting- much improved today, suspect this was  related to patient's chemotherapy - abdomen appears benign. Recent CT scan of the abdomen did not show anything acute. If there is further episodes may consider further imaging. 6. Metastatic colon cancer per oncologist.  DVT prophylaxis: Lovenox. Code Status: Full code.  Family Communication: Discussed with patient.  Disposition Plan: Home.  Consults called: cardiology, heme/onc  Admission status: Inpatient. Likely stay 2 days.   Subjective: Pt had episode of low BP overnight but feeling much better this morning.   Objective: Vitals:   06/18/16 0900 06/18/16 0917 06/18/16 0929 06/18/16 1000  BP:  (!) 151/85  (!) 145/92  Pulse: 96 94 (!) 103 100  Resp: (!) 21 (!) 21 (!) 21 (!) 24  Temp:      TempSrc:      SpO2: 100% 100% 96% 100%  Weight:      Height:        Intake/Output Summary (Last 24 hours) at 06/18/16 1156 Last data filed at 06/18/16 0923  Gross per 24 hour  Intake             1133 ml  Output             1750 ml  Net             -617 ml   Filed Weights   06/17/16 0046 06/17/16 0500 06/18/16 0500  Weight: 78.8 kg (173 lb 11.6 oz) 78.8 kg (173 lb 11.6 oz) 78.6 kg (173 lb 4.5 oz)    Exam:  Eyes: Anicteric no pallor. ENMT: No discharge from the ears eyes nose and mouth. Neck: JVD elevated no mass felt. Respiratory: No rhonchi or crepitations. Cardiovascular: S1  and S2 heard no murmurs appreciated. Abdomen: Soft nontender bowel sounds present. No guarding or rigidity. Musculoskeletal: No edema. No joint effusion. Skin: No rash. Skin appears warm. Neurologic: Alert awake oriented to time place and person. Moves all extremities. Psychiatric: Appears normal. Normal affect.  Data Reviewed: Basic Metabolic Panel:  Recent Labs Lab 06/16/16 1944 06/16/16 2326 06/17/16 0321 06/18/16 0636  NA 139  --  140 141  K 4.0  --  3.6 3.2*  CL 106  --  106 103  CO2 23  --  25 27  GLUCOSE 124*  --  107* 112*  BUN 13  --  13 17  CREATININE 1.07* 1.27* 1.25* 1.47*    CALCIUM 9.4  --  8.9 8.9   Liver Function Tests:  Recent Labs Lab 06/16/16 1944 06/18/16 0636  AST 21 31  ALT 34 31  ALKPHOS 68 61  BILITOT 0.9 0.5  PROT 7.2 6.2*  ALBUMIN 4.3 3.7   No results for input(s): LIPASE, AMYLASE in the last 168 hours. No results for input(s): AMMONIA in the last 168 hours. CBC:  Recent Labs Lab 06/16/16 1944 06/16/16 2326 06/17/16 0321  WBC 3.9* 5.1 3.5*  NEUTROABS 1.5*  --  1.4*  HGB 12.6 12.3 11.7*  HCT 41.5 40.6 39.0  MCV 74.4* 74.4* 74.6*  PLT 443* 478* 411*   Cardiac Enzymes: No results for input(s): CKTOTAL, CKMB, CKMBINDEX, TROPONINI in the last 168 hours. CBG (last 3)   Recent Labs  06/17/16 1947  GLUCAP 235*   Recent Results (from the past 240 hour(s))  Culture, blood (Routine X 2) w Reflex to ID Panel     Status: None (Preliminary result)   Collection Time: 06/16/16  7:50 PM  Result Value Ref Range Status   Specimen Description BLOOD RIGHT HAND  Final   Special Requests BOTTLES DRAWN AEROBIC AND ANAEROBIC 5CC  Final   Culture   Final    NO GROWTH < 24 HOURS Performed at [facility]    Report Status PENDING  Incomplete  Culture, blood (Routine X 2) w Reflex to ID Panel     Status: None (Preliminary result)   Collection Time: 06/16/16  8:06 PM  Result Value Ref Range Status   Specimen Description BLOOD PORTA CATH  Final   Special Requests BOTTLES DRAWN AEROBIC AND ANAEROBIC 5CC  Final   Culture   Final    NO GROWTH < 24 HOURS Performed at [facility]    Report Status PENDING  Incomplete  MRSA PCR Screening     Status: None   Collection Time: 06/17/16  3:58 AM  Result Value Ref Range Status   MRSA by PCR NEGATIVE NEGATIVE Final    Comment:        The GeneXpert MRSA Assay (FDA approved for NASAL specimens only), is one component of a comprehensive MRSA colonization surveillance program. It is not intended to diagnose MRSA infection nor to guide or monitor treatment for MRSA infections.       Studies: Dg Chest 2 View  Result Date: 06/16/2016 CLINICAL DATA:  Shortness of breath and nausea.  Chest pain EXAM: CHEST  2 VIEW COMPARISON:  April 08, 2015 FINDINGS: There is patchy consolidation in the posterior left base region. Lungs elsewhere clear. There is cardiomegaly with mild pulmonary venous hypertension. No adenopathy. Port-A-Cath tip is in the superior vena cava. No pneumothorax. No bone lesions. IMPRESSION: Posterior segment left lower lobe airspace consolidation consistent with pneumonia. Cardiomegaly with mild pulmonary venous  hypertension consistent with a degree of pulmonary vascular congestion. No edema. Port-A-Cath tip in superior vena cava.  No pneumothorax. Followup PA and lateral chest radiographs recommended in 3-4 weeks following trial of antibiotic therapy to ensure resolution and exclude underlying malignancy. Electronically Signed   By: Lowella Grip III M.D.   On: 06/16/2016 18:59   Nm Pulmonary Perf And Vent  Result Date: 06/17/2016 CLINICAL DATA:  Tachycardia.  Shortness of breath. EXAM: NUCLEAR MEDICINE VENTILATION - PERFUSION LUNG SCAN TECHNIQUE: Ventilation images were obtained in multiple projections using inhaled aerosol Tc-49m DTPA. Perfusion images were obtained in multiple projections after intravenous injection of Tc-49m MAA. RADIOPHARMACEUTICALS:  30.2 mCi Technetium-74m DTPA aerosol inhalation and 4.0 mCi Technetium-46m MAA IV COMPARISON:  CT 06/11/2016. FINDINGS: No ventilation perfusion mismatches.  Tiny patchy matched defects. IMPRESSION: Low probability pulmonary embolus. Electronically Signed   By: [place]   On: 06/17/2016 15:13     Scheduled Meds: . azithromycin  500 mg Intravenous Q24H  . cefTRIAXone (ROCEPHIN)  IV  1 g Intravenous Q24H  . enoxaparin (LOVENOX) injection  40 mg Subcutaneous QHS  . furosemide  40 mg Intravenous Q12H  . NIFEdipine  30 mg Oral Daily  . potassium chloride SA  40 mEq Oral BID  . sodium chloride flush  3  mL Intravenous Q12H   Continuous Infusions:  Principal Problem:   Acute respiratory failure with hypoxia (HCC) Active Problems:   Congestive dilated cardiomyopathy (HCC)   Acute on chronic systolic CHF (congestive heart failure), NYHA class 3 (HCC)   Iron deficiency anemia due to chronic blood loss   Hypertension   Metastatic colon cancer to liver [facility])   Community acquired pneumonia of left lower lobe of lung (HCC)   CHF (congestive heart failure) ([place])  Critical Care Time spent: 28 mins  Irwin Brakeman, MD, FAAFP [facility] Pager [phone]  If 7PM-7AM, please contact night-coverage [email or web address] Password [email or web address] 06/18/2016, 11:56 AM    LOS: 2 days

## 2016-06-18 NOTE — Progress Notes (Signed)
Pt. Stated she would like to hold off on taking nifedipine until speaking with cardiology. Coreg was D/C this am until further consult with cardiology. Will continue to monitor.

## 2016-06-18 NOTE — Consult Note (Addendum)
Cardiology Consult    Patient ID: Christina [name] MRN: [id number], DOB/AGE: March 30, 1961   Admit date: 06/16/2016 Date of Consult: 06/18/2016  Primary Physician: Benito Mccreedy, MD Reason for Consult: Abnormal Echocardiogram Primary Cardiologist: New Requesting Provider: Dr. 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   Was diagnosed with bronchitis and pleural effusions by her PCP last week and started on PO Lasix which helped initially but her symptoms continued to progress.  Initial labs showed a WBC of 3.9, Hgb 12.6, and platelets 443. K+ 4.0. Creatinine 1.07. BNP 2116. CXR showing posterior segment left lower lobe airspace consolidation consistent with pneumonia and cardiomegaly with mild pulmonary venous hypertension consistent with a degree of pulmonary vascular congestion. VQ scan showing a low-probability of PE. EKG shows sinus tachycardia, HR 104, with lateral TWI.  She was started on IV Antibiotics with Ceftriaxone and Zithromax along with IV Lasix 24m BID. Overall her net output is -1.6L thus far, but weight is recorded as being down 6 lbs (179 lbs --> 173 lbs). An echocardiogram was performed yesterday which showed an EF of 20-25% with severe diffuse hypokinesis with distinct regional wall motion abnormalities along with akinesis of the entireinferior myocardium and basal-midanteroseptal myocardium. Grade 2 noted along with moderate AI, moderate TR, and PA peak pressure of 56 mm Hg (S) with a left pleural effusion.  In talking with the patient, she denies any prior cardiac history. No past history of myocardial infarctions or cardiac arrhythmias. Reports having an episode of chest  discomfort back in 2014 when learning on her mother's passing. Denies any repeat episodes of chest discomfort since.  Over the past month, she has experienced worsening abdominal distention and dyspnea. Started to develop orthopnea last week. Was started on Lasix with mild improvement in her symptoms. Reports her chest does feel "full" at times but thought this was due to volume overload. No exertional symptoms noted. Does report over a 10 pound weight gain in the past month.  [place] then comes here for her chemotherapy treatments on Thursdays. 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   No family history of CAD. No smoking history.    Past Medical History   Past Medical History:  Diagnosis Date  . Endometriosis   . Hypertension   . met colon ca to liver dx'd 03/2015  . Thalassanemia     Past Surgical History:  Procedure Laterality Date  . COLON RESECTION N/A 04/08/2015   Procedure: LAPAROSCOPIC  RESECTION OF PART OF  SIGMOID COLON;  Surgeon: SMichael Boston MD;  Location: [facility];  Service: General;  Laterality: N/A;  . DIAGNOSTIC LAPAROSCOPY     Endometriosis  . LAPAROSCOPIC SIGMOID COLECTOMY  04/08/2015   for colorectal cancer  . LIVER BIOPSY N/A 04/08/2015   Procedure: CORE NEEDLE LIVER BIOPSY;  Surgeon: SMichael Boston MD;  Location: [facility];  Service: General;  Laterality: N/A;  . [place] PORTACATH PLACEMENT N/A 04/08/2015   Procedure: INSERTION PORT-A-CATH;  Surgeon: SMichael Boston MD;  Location: [facility];  Service: General;  Laterality: N/A;     Allergies  Allergies  Allergen Reactions  . Lactose Intolerance (Gi)  Other (See Comments)    Diarrhea, Bloated, Abdominal pain.  . Lorazepam Other (See Comments)    Pt couldn't walk straight, put her in a daze  . Milk-Related Compounds Diarrhea    Inpatient Medications    . azithromycin  500 mg Intravenous Q24H  . carvedilol  3.125 mg Oral BID WC    . cefTRIAXone (ROCEPHIN)  IV  1 g Intravenous Q24H  . [START ON 06/19/2016] doxycycline  100 mg Oral Q12H  . enoxaparin (LOVENOX) injection  40 mg Subcutaneous QHS  . [START ON 06/19/2016] furosemide  40 mg Intravenous Daily  . potassium chloride SA  40 mEq Oral BID  . sodium chloride flush  3 mL Intravenous Q12H    Family History    Family History  Problem Relation Age of Onset  . Anesthesia problems Cousin 29    maternal cousin, colon cancer   . Clotting disorder Maternal Grandmother     Social History    Social History   Social History  . Marital status: Married    Spouse name: N/A  . Number of children: N/A  . Years of education: N/A   Occupational History  . 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 Smoking status: Never Smoker  . Smokeless tobacco: Never Used  . Alcohol use Yes     Comment: Once every four months.   . Drug use: No  . Sexual activity: Not on file   Other Topics Concern  . 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  Cardiovascular:  No chest pain, edema, palpitations, paroxysmal nocturnal dyspnea. Positive for dyspnea and orthopnea.  Dermatological: No rash, lesions/masses Respiratory: No cough, Positive for dyspnea Urologic: No hematuria, dysuria Abdominal:   No nausea, vomiting, diarrhea, bright red blood per rectum, melena, or hematemesis Neurologic:  No visual changes, wkns, changes in mental status. All other systems reviewed and are otherwise negative except as noted above.  Physical Exam    Blood pressure (!) 145/92, pulse 100, temperature 98 F (36.7 C), temperature source Oral, resp. rate (!) 24, height _0  (1.676 m), weight 173 lb 4.5 oz (78.6 kg), SpO2 100 %.  General: Pleasant, African female appearing  in NAD. Psych: Normal affect. Neuro: Alert and oriented X 3. Moves all extremities spontaneously. HEENT: Normal  Neck: Supple without bruits. JVD at 9cm. Lungs:  Resp regular and unlabored, diminished breath sounds along left base. Heart: RRR no s3, s4, 2/6 SEM at A[place]. Abdomen: Soft, non-tender, non-distended, BS + x 4.  Extremities: No clubbing, cyanosis or edema. DP/PT/Radials 2+ and equal bilaterally.  Labs    Troponin [facility] of Care Test)  Recent Labs  06/16/16 2003  TROPIPOC 0.00   No results for input(s): CKTOTAL, CKMB, TROPONINI in the last 72 hours. Lab Results  Component Value Date   WBC 3.5 (L) 06/17/2016   HGB 11.7 (L) 06/17/2016   HCT 39.0 06/17/2016   MCV 74.6 (L) 06/17/2016   PLT 411 (H) 06/17/2016     Recent Labs Lab 06/18/16 0636  NA 141  K 3.2*  CL 103  CO2 27  BUN 17  CREATININE 1.47*  CALCIUM 8.9  PROT 6.2*  BILITOT 0.5  ALKPHOS 61  ALT 31  AST 31  GLUCOSE 112*   No results found for: CHOL, HDL, LDLCALC, TRIG No results found for: DDIMER   Radiology Studies    Dg Chest 2 View  Result Date: 06/16/2016 CLINICAL DATA:  Shortness of breath and nausea.  Chest pain EXAM: CHEST  2 VIEW COMPARISON:  April 08, 2015 FINDINGS: There is patchy consolidation in the posterior left base region. Lungs elsewhere clear. There is cardiomegaly with mild pulmonary venous hypertension. No adenopathy. Port-A-Cath tip is in the superior vena cava. No pneumothorax. No bone lesions. IMPRESSION: Posterior segment left lower lobe airspace consolidation consistent with pneumonia. Cardiomegaly with mild pulmonary venous hypertension consistent with a degree of pulmonary vascular congestion. No edema. Port-A-Cath tip in superior vena cava.  No pneumothorax. Followup PA and lateral chest radiographs recommended in 3-4 weeks following trial of antibiotic therapy to ensure resolution and exclude underlying malignancy. Electronically Signed   By: Lowella Grip III M.D.    On: 06/16/2016 18:59   Ct Chest W Contrast  Result Date: 06/11/2016 CLINICAL DATA:  Followup metastatic colon carcinoma to liver. Ongoing chemotherapy. Restaging. EXAM: CT CHEST, ABDOMEN, AND PELVIS WITH CONTRAST TECHNIQUE: Multidetector CT imaging of the chest, abdomen and pelvis was performed following the standard protocol during bolus administration of intravenous contrast. CONTRAST:  149m ISOVUE-300 IOPAMIDOL (ISOVUE-300) INJECTION 61% COMPARISON:  01/14/2016 FINDINGS: CT CHEST FINDINGS Cardiovascular: Stable cardiomegaly. Mediastinum/Lymph Nodes: No masses or pathologically enlarged lymph nodes identified. Tiny hiatal hernia again noted. Lungs/Pleura: Small bilateral pleural effusions are new since previous study. New bibasilar atelectasis seen, left side greater than right. No evidence of pulmonary consolidation or mass. Musculoskeletal: No suspicious bone lesions or other significant abnormality. CT ABDOMEN AND PELVIS FINDINGS Hepatobiliary: Partially calcified mass in segment 2 of the left lobe measures 6.7 x 4.7 cm on image 47/2, without significant change compared to previous study. No other liver masses are identified. Tiny calcified gallstones again seen, without evidence cholecystitis or biliary ductal dilatation. Pancreas:  No mass or inflammatory changes. Spleen:  Within normal limits in size and appearance. Adrenals/Urinary tract:  No masses or hydronephrosis. Stomach/Bowel: No evidence of obstruction, inflammatory process, or abnormal fluid collections. Vascular/Lymphatic: No pathologically enlarged lymph nodes identified. No abdominal aortic aneurysm. Reproductive: Multiple uterine fibroids remains stable, largest measuring 8.5 cm. No other pelvic masses identified. No evidence of free fluid. Other: Nodular curvilinear soft tissue density in the midline ventral abdominal wall subcutaneous fat remains stable, measuring approximately 1.6 x 2.7 cm on image 55/2. This likely represents  postsurgical scarring with tumor seeding a surgical track considered less likely. Musculoskeletal:  No suspicious bone lesions identified. IMPRESSION: New small pleural effusions and bibasilar atelectasis, left side greater than right, of uncertain etiology. No thoracic masses or lymphadenopathy identified. Stable left hepatic lobe metastasis. No new or progressive metastatic disease identified within the abdomen or pelvis. Stable nodular carpal linear soft tissue density in midline ventral abdominal wall subcutaneous fat, which favors postsurgical scarring with tumor seeding of surgical tract considered less likely. Recommend continued attention on follow-up CT. Stable uterine fibroids, cholelithiasis, and tiny hiatal hernia. Electronically Signed   By: JEarle GellM.D.   On: 06/11/2016 17:35   Ct Abdomen Pelvis W Contrast  Result Date: 06/11/2016 CLINICAL DATA:  Followup metastatic colon carcinoma to liver. Ongoing chemotherapy. Restaging. EXAM: CT CHEST, ABDOMEN, AND PELVIS WITH CONTRAST TECHNIQUE: Multidetector CT imaging of the chest, abdomen and pelvis was performed following the standard protocol during bolus administration of intravenous contrast. CONTRAST:  165m ISOVUE-300 IOPAMIDOL (ISOVUE-300) INJECTION 61% COMPARISON:  01/14/2016 FINDINGS: CT CHEST FINDINGS Cardiovascular: Stable cardiomegaly. Mediastinum/Lymph Nodes: No masses or pathologically enlarged lymph nodes identified. Tiny hiatal hernia again noted. Lungs/Pleura: Small bilateral pleural effusions are new since previous study. New bibasilar atelectasis seen, left side greater than right. No evidence of pulmonary consolidation or mass. Musculoskeletal: No suspicious bone lesions or other significant abnormality. CT ABDOMEN AND PELVIS FINDINGS Hepatobiliary: Partially calcified mass in segment 2 of the left lobe measures 6.7 x 4.7 cm on image 47/2, without significant change compared to previous study. No other liver masses are identified.  Tiny calcified gallstones again seen, without evidence cholecystitis or biliary ductal dilatation. Pancreas:  No mass or inflammatory changes. Spleen:  Within normal limits in size and appearance. Adrenals/Urinary tract:  No masses or hydronephrosis. Stomach/Bowel: No evidence of obstruction, inflammatory process, or abnormal fluid collections. Vascular/Lymphatic: No pathologically enlarged lymph nodes identified. No abdominal aortic aneurysm. Reproductive: Multiple uterine fibroids remains stable, largest measuring 8.5 cm. No other pelvic masses identified. No evidence of free fluid. Other: Nodular curvilinear soft tissue density in the midline ventral abdominal wall subcutaneous fat remains stable, measuring approximately 1.6 x 2.7 cm on image 55/2. This likely represents postsurgical scarring with tumor seeding a surgical track considered less likely. Musculoskeletal:  No suspicious bone lesions identified. IMPRESSION: New small pleural effusions and bibasilar atelectasis, left side greater than right, of uncertain etiology. No thoracic masses or lymphadenopathy identified. Stable left hepatic lobe metastasis. No new or progressive metastatic disease identified within the abdomen or pelvis. Stable nodular carpal linear soft tissue density in midline ventral abdominal wall subcutaneous fat, which favors postsurgical scarring with tumor seeding of surgical tract considered less likely. Recommend continued attention on follow-up CT. Stable uterine fibroids, cholelithiasis, and tiny hiatal hernia. Electronically Signed   By: JEarle GellM.D.   On: 06/11/2016 17:35   Nm Pulmonary Perf And Vent  Result Date: 06/17/2016 CLINICAL DATA:  Tachycardia.  Shortness of breath. EXAM: NUCLEAR MEDICINE VENTILATION - PERFUSION LUNG SCAN TECHNIQUE: Ventilation images were obtained in multiple projections using inhaled aerosol [id number]. Perfusion images were obtained in multiple projections after intravenous injection of  Tc-9976mA. RADIOPHARMACEUTICALS:  30.2 mCi Technetium-88m65mA aerosol inhalation and 4.0 mCi Technetium-88m 48mIV COMPARISON:  CT 06/11/2016. FINDINGS: No ventilation perfusion mismatches.  Tiny patchy matched defects. IMPRESSION: Low probability pulmonary embolus. Electronically Signed   By: ThomaMarcello Mooresister   On: 06/17/2016 15:13    EKG & Cardiac Imaging    EKG: Sinus tachycardia, HR 104, with lateral TWI.  Echocardiogram: 06/17/2016 Study Conclusions  - Left ventricle: The cavity size was moderately dilated. Systolic   function was severely reduced. The estimated ejection fraction   was in the range of 20% to 25%. Severe diffuse hypokinesis with   distinct regional wall motion abnormalities. There is akinesis of   the entireinferior myocardium. There is akinesis of the   basal-midanteroseptal myocardium. Features are consistent with a   pseudonormal left ventricular filling pattern, with concomitant   abnormal relaxation and increased filling pressure (grade 2   diastolic dysfunction). Doppler parameters are consistent with   high ventricular filling pressure. - Aortic valve: Trileaflet; mildly thickened, mildly calcified   leaflets. There was moderate regurgitation. - Mitral valve: There was mild to moderate regurgitation. - Tricuspid valve: There was moderate regurgitation. - Pulmonary arteries: PA peak pressure: 56 mm Hg (S). - Pericardium, extracardiac: There was a left pleural effusion.  Impressions:  - The right ventricular  systolic pressure was increased consistent   with moderate pulmonary hypertension.  Assessment & Plan    1. 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 Notes over a 10 pound weight gain in the past month (baseline weight ~ 265 lbs). - initial labs showed a BNP of 2116. CXR showing posterior segment left lower lobe airspace consolidation  consistent with pneumonia. EKG shows sinus tachycardia, HR 104, with lateral TWI. - echo shows newly reduced EF of 20-25% with severe diffuse hypokinesis with distinct regional wall motion abnormalities along with akinesis of the entireinferior myocardium and basal-midanteroseptal myocardium. Her last echocardiogram was in 2012 which the patient reports was "normal". - currently on IV Lasix 72m BID with weight being down 6 lbs. Continue with IV Lasix for now. Will start low-dose Coreg and discontinue PTA Procardia. Would add low-dose ACE-I/ARB prior to discharge if BP allows. Etiology of her cardiomyopathy is unknown. Possibly hypertension related  vs. chemotherapy related toxicity. Other possibilities include viral in the setting of her recent URI and now PNA . Ischemic heart disease seems less likely in the absence of chest discomfort and elevated cardiac markers.  Discussed ischemic evaluation with the patient. With her Stage 4 colon cancer, she would prefer to be conservative and undergo a NST initially which certainly seems reasonable. Would not pursue cath unless evidence of reversible ischemia noted. Of note, she does not want a Lexiscan injection and prefers to have a Treadmill Myoview. May be beneficial to wait until her respiratory status is at baseline (possibly perform on Sunday).   2. Hypertension - on Procardia-XL 338mdaily prior to admission. Had an episode of hypotension following initiation of Coreg while on Procardia and IV Lasix.  - will discontinue Procardia. Start Coreg 3.12554mID.   3. PNA - WBC 3.5. CXR on admission showed posterior segment left lower lobe airspace consolidation consistent with pneumonia and cardiomegaly with mild pulmonary venous hypertension consistent with a degree of pulmonary vascular congestion.  - remains on Azithromycin and Ceftriaxone.  - per admitting team  4. Stage 4 Colon Cancer - with known mets to the liver, currently undergoing chemotherapy. -  Oncology following.    Signed, BriErma HeritageA-C 06/18/2016, 12:12 PM Pager: [phone] patient has been seen in conjunction with BriBernerd PhoA-C. All aspects of care have been considered and discussed. The patient has been personally interviewed, examined, and all clinical data has been reviewed.   Acute on chronic systolic heart failure (dilated LV and free wall hypertrophy by echo). This raises the possibility of poorly controlled hypertension as the etiology of heart failure. In reviewing chart, blood pressures are always 145 or greater systolic and frequently greater than 90 diastolic. On admission her blood pressure was extremely elevated at 154/115 mmHg.  Neck veins are flat (she is sitting in a chair when examined). Faint basilar rales are heard. S3 gallop is audible.   Clinical presentation with systolic heart failure. Some clinical features suggest hypertension related heart failure. Rule out cardiotoxicity from chemotherapy. Rule out coronary artery disease. She has also developed acute kidney injury from diuresis.   Plan to optimize medical therapy. Hold ARB/ACE therapy until kidney function stabilizes. Will add low-dose hydralazine and long-acting nitrates. Noninvasive myocardial perfusion imaging when stable (could be done as an OP). Care with diuresis until kidney function improves/stabilizes.  We should consider having the advanced heart failure team see the patient, perhaps as an OP.

## 2016-06-18 NOTE — Progress Notes (Addendum)
Patient wanted to have her BP checked. States that she is feeling the same symptoms like she had last night when her BP was low. Patient complaining of chest tightness, weakness, and diaphoretic. BP is now 98/78. Will recheck BP in 5 minutes. Patient asked for cup of water. Will continue to monitor.

## 2016-06-18 NOTE — Progress Notes (Signed)
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 She is able to move around better, less short of breath on exertion. She was moved to regular floor today. She was seen by cardiology today.  Objective:  Vitals:   06/18/16 1411 06/18/16 1606  BP: (!) 140/100 (!) 131/93  Pulse: (!) 101 99  Resp:    Temp:      Body mass index is 27.97 kg/m.  Intake/Output Summary (Last 24 hours) at 06/18/16 1805 Last data filed at 06/18/16 0923  Gross per 24 hour  Intake              983 ml  Output             1300 ml  Net             -317 ml     Sclerae unicteric  Oropharynx clear  No peripheral adenopathy  Lungs clear -- no rales or rhonchi, decreased breathing sound at the left lung base   Heart regular rate and rhythm  Abdomen benign  MSK no focal spinal tenderness, no peripheral edema  Neuro nonfocal    CBG (last 3)   Recent Labs  06/17/16 1947  GLUCAP 235*     Labs:  Lab Results  Component Value Date   WBC 3.5 (L) 06/17/2016   HGB 11.7 (L) 06/17/2016   HCT 39.0 06/17/2016   MCV 74.6 (L) 06/17/2016   PLT 411 (H) 06/17/2016   NEUTROABS 1.4 (L) 06/17/2016   CMP Latest Ref Rng & Units 06/18/2016 06/17/2016 06/16/2016  Glucose 65 - 99 mg/dL 112(H) 107(H) -  BUN 6 - 20 mg/dL 17 13 -  Creatinine 0.44 - 1.00 mg/dL 1.47(H) 1.25(H) 1.27(H)  Sodium 135 - 145 mmol/L 141 140 -  Potassium 3.5 - 5.1 mmol/L 3.2(L) 3.6 -  Chloride 101 - 111 mmol/L 103 106 -  CO2 22 - 32 mmol/L 27 25 -  Calcium 8.9 - 10.3 mg/dL 8.9 8.9 -  Total Protein 6.5 - 8.1 g/dL 6.2(L) - -  Total Bilirubin 0.3 - 1.2 mg/dL 0.5 - -  Alkaline Phos 38 - 126 U/L 61 - -  AST 15 - 41 U/L 31 - -  ALT 14 - 54 U/L 31 - -     Urine Studies No results for input(s): UHGB, CRYS in the last 72 hours.  Invalid input(s): UACOL, UAPR, USPG, UPH, UTP,  UGL, UKET, UBIL, UNIT, UROB, ULEU, UEPI, UWBC, URBC, UBAC, CAST, [place], [place]  Basic Metabolic Panel:  Recent Labs Lab 06/16/16 1944 06/16/16 2326 06/17/16 0321 06/18/16 0636  NA 139  --  140 141  K 4.0  --  3.6 3.2*  CL 106  --  106 103  CO2 23  --  25 27  GLUCOSE 124*  --  107* 112*  BUN 13  --  13 17  CREATININE 1.07* 1.27* 1.25* 1.47*  CALCIUM 9.4  --  8.9 8.9   GFR Estimated Creatinine Clearance: 45.2 mL/min (by C-G formula based on SCr of 1.47 mg/dL (H)). Liver Function Tests:  Recent Labs Lab 06/16/16 1944 06/18/16 0636  AST 21 31  ALT 34 31  ALKPHOS 68 61  BILITOT 0.9 0.5  PROT 7.2 6.2*  ALBUMIN 4.3 3.7   No results for input(s): LIPASE, AMYLASE in the last 168 hours. No results for  input(s): AMMONIA in the last 168 hours. Coagulation profile No results for input(s): INR, PROTIME in the last 168 hours.  CBC:  Recent Labs Lab 06/16/16 1944 06/16/16 2326 06/17/16 0321  WBC 3.9* 5.1 3.5*  NEUTROABS 1.5*  --  1.4*  HGB 12.6 12.3 11.7*  HCT 41.5 40.6 39.0  MCV 74.4* 74.4* 74.6*  PLT 443* 478* 411*   Cardiac Enzymes: No results for input(s): CKTOTAL, CKMB, CKMBINDEX, TROPONINI in the last 168 hours. BNP: Invalid input(s): POCBNP CBG:  Recent Labs Lab 06/17/16 1947  GLUCAP 235*   D-Dimer No results for input(s): DDIMER in the last 72 hours. Hgb A1c No results for input(s): HGBA1C in the last 72 hours. Lipid Profile No results for input(s): CHOL, HDL, LDLCALC, TRIG, CHOLHDL, LDLDIRECT in the last 72 hours. Thyroid function studies No results for input(s): TSH, T4TOTAL, T3FREE, THYROIDAB in the last 72 hours.  Invalid input(s): FREET3 Anemia work up No results for input(s): VITAMINB12, FOLATE, FERRITIN, TIBC, IRON, RETICCTPCT in the last 72 hours. Microbiology Recent Results (from the past 240 hour(s))  Culture, blood (Routine X 2) w Reflex to ID Panel     Status: None (Preliminary result)   Collection Time: 06/16/16  7:50 PM  Result Value  Ref Range Status   Specimen Description BLOOD RIGHT HAND  Final   Special Requests BOTTLES DRAWN AEROBIC AND ANAEROBIC 5CC  Final   Culture   Final    NO GROWTH 2 DAYS Performed at [facility]    Report Status PENDING  Incomplete  Culture, blood (Routine X 2) w Reflex to ID Panel     Status: None (Preliminary result)   Collection Time: 06/16/16  8:06 PM  Result Value Ref Range Status   Specimen Description BLOOD PORTA CATH  Final   Special Requests BOTTLES DRAWN AEROBIC AND ANAEROBIC 5CC  Final   Culture   Final    NO GROWTH 2 DAYS Performed at [facility]    Report Status PENDING  Incomplete  MRSA PCR Screening     Status: None   Collection Time: 06/17/16  3:58 AM  Result Value Ref Range Status   MRSA by PCR NEGATIVE NEGATIVE Final    Comment:        The GeneXpert MRSA Assay (FDA approved for NASAL specimens only), is one component of a comprehensive MRSA colonization surveillance program. It is not intended to diagnose MRSA infection nor to guide or monitor treatment for MRSA infections.       Studies:  Dg Chest 2 View  Result Date: 06/16/2016 CLINICAL DATA:  Shortness of breath and nausea.  Chest pain EXAM: CHEST  2 VIEW COMPARISON:  April 08, 2015 FINDINGS: There is patchy consolidation in the posterior left base region. Lungs elsewhere clear. There is cardiomegaly with mild pulmonary venous hypertension. No adenopathy. Port-A-Cath tip is in the superior vena cava. No pneumothorax. No bone lesions. IMPRESSION: Posterior segment left lower lobe airspace consolidation consistent with pneumonia. Cardiomegaly with mild pulmonary venous hypertension consistent with a degree of pulmonary vascular congestion. No edema. Port-A-Cath tip in superior vena cava.  No pneumothorax. Followup PA and lateral chest radiographs recommended in 3-4 weeks following trial of antibiotic therapy to ensure resolution and exclude underlying malignancy. Electronically Signed   By:  Lowella Grip III M.D.   On: 06/16/2016 18:59   Nm Pulmonary Perf And Vent  Result Date: 06/17/2016 CLINICAL DATA:  Tachycardia.  Shortness of breath. EXAM: NUCLEAR MEDICINE VENTILATION - PERFUSION LUNG SCAN TECHNIQUE:  Ventilation images were obtained in multiple projections using inhaled aerosol Tc-34m DTPA. Perfusion images were obtained in multiple projections after intravenous injection of Tc-38m MAA. RADIOPHARMACEUTICALS:  30.2 mCi Technetium-54m DTPA aerosol inhalation and 4.0 mCi Technetium-29m MAA IV COMPARISON:  CT 06/11/2016. FINDINGS: No ventilation perfusion mismatches.  Tiny patchy matched defects. IMPRESSION: Low probability pulmonary embolus. Electronically Signed   By: Marcello Moores  Register   On: 06/17/2016 15:13    Assessment: 55 y.o. African-American female, with metastatic colon cancer to liver, on chemotherapy, presented with worsening cough, dyspnea on exertion, orthopnea and leg swelling   1. Healthcare associated left pneumonia 2. Acute on chronic congestive heart failure, secondary to hypertension, versus chemotherapy 3. HTN 4. AKI, secondary to diuretics and sepsis  5. Metastatic colon cancer 6. Iron deficient anemia and beta thalassemia trait 7. Chemotherapy-induced anemia  Plan:  -Appreciated cardiology input, her cardiac medication has been adjusted, she will have noninvasive myocardial perfusion image when clinically stable -I'll cancel her chemotherapy next week, with discussed holding her chemotherapy for at least her cardiac workup completes. If she needs cardio catheterization, or even stent placement, it will be fine with me from her oncological standpoint  -We discussed that 5-FU induced coronary artery spasm, and if she does have underlying coronary artery disease, I'll present her to be treated first, so she can tolerate more chemotherapy down the road. -she plans to go back to work mid next week, and I suggest her to take more time off to recover better  -If  she is going home this weekend, I plan to see her back in 2-3 weeks    Truitt Merle, MD 06/18/2016

## 2016-06-19 DIAGNOSIS — I420 Dilated cardiomyopathy: Secondary | ICD-10-CM

## 2016-06-19 LAB — CBC WITH DIFFERENTIAL/PLATELET
Basophils Absolute: 0 10*3/uL (ref 0.0–0.1)
Basophils Relative: 1 %
Eosinophils Absolute: 0.1 10*3/uL (ref 0.0–0.7)
Eosinophils Relative: 2 %
HEMATOCRIT: 38.1 % (ref 36.0–46.0)
HEMOGLOBIN: 11.4 g/dL — AB (ref 12.0–15.0)
LYMPHS ABS: 2.4 10*3/uL (ref 0.7–4.0)
Lymphocytes Relative: 52 %
MCH: 22.6 pg — AB (ref 26.0–34.0)
MCHC: 29.9 g/dL — AB (ref 30.0–36.0)
MCV: 75.4 fL — AB (ref 78.0–100.0)
MONO ABS: 0.4 10*3/uL (ref 0.1–1.0)
MONOS PCT: 9 %
NEUTROS ABS: 1.7 10*3/uL (ref 1.7–7.7)
NEUTROS PCT: 36 %
Platelets: 491 10*3/uL — ABNORMAL HIGH (ref 150–400)
RBC: 5.05 MIL/uL (ref 3.87–5.11)
RDW: 19.9 % — ABNORMAL HIGH (ref 11.5–15.5)
WBC: 4.6 10*3/uL (ref 4.0–10.5)

## 2016-06-19 LAB — RENAL FUNCTION PANEL
ANION GAP: 8 (ref 5–15)
Albumin: 3.9 g/dL (ref 3.5–5.0)
BUN: 15 mg/dL (ref 6–20)
CHLORIDE: 105 mmol/L (ref 101–111)
CO2: 27 mmol/L (ref 22–32)
Calcium: 9.1 mg/dL (ref 8.9–10.3)
Creatinine, Ser: 1.45 mg/dL — ABNORMAL HIGH (ref 0.44–1.00)
GFR calc non Af Amer: 39 mL/min — ABNORMAL LOW (ref >60)
GFR, EST AFRICAN AMERICAN: 46 mL/min — AB (ref >60)
GLUCOSE: 119 mg/dL — AB (ref 65–99)
Phosphorus: 4.1 mg/dL (ref 2.5–4.6)
Potassium: 3.6 mmol/L (ref 3.5–5.1)
Sodium: 140 mmol/L (ref 135–145)

## 2016-06-19 LAB — BASIC METABOLIC PANEL
Anion gap: 11 (ref 5–15)
BUN: 14 mg/dL (ref 6–20)
CHLORIDE: 106 mmol/L (ref 101–111)
CO2: 25 mmol/L (ref 22–32)
CREATININE: 1.4 mg/dL — AB (ref 0.44–1.00)
Calcium: 9.2 mg/dL (ref 8.9–10.3)
GFR calc non Af Amer: 41 mL/min — ABNORMAL LOW (ref >60)
GFR, EST AFRICAN AMERICAN: 48 mL/min — AB (ref >60)
GLUCOSE: 139 mg/dL — AB (ref 65–99)
Potassium: 3.5 mmol/L (ref 3.5–5.1)
Sodium: 142 mmol/L (ref 135–145)

## 2016-06-19 LAB — LEGIONELLA PNEUMOPHILA SEROGP 1 UR AG: L. PNEUMOPHILA SEROGP 1 UR AG: NEGATIVE

## 2016-06-19 NOTE — Progress Notes (Signed)
PROGRESS NOTE    Christina [name]  [id number]  DOB: Jan 03, 1961  DOA: 06/16/2016 PCP: Benito Mccreedy, MD Outpatient Specialists:  Dr. Alexis Goodell course: HPI: Christina [name] is a 55 y.o. female with metastatic colon cancer on chemotherapy, hypertension has been recently experiencing shortness of breath. Patient has been having symptoms of bronchitis for last 3 weeks. Patient's oncologist had CT chest and abdomen 5 days ago which showed bilateral pleural effusion. Patient eventually started developing orthopnea and paroxysmal dyspnea and was started on Lasix 4 days ago orally daily. Despite taking which patient was progressively getting short of breath and presented to the ER. Chest x-ray shows pneumonic process. BNP is markedly elevated. Patient was started on antibiotics and Lasix 40 mg IV was given in the ER. Patient is being admitted for acute respiratory failure probably combination of CHF and pneumonia. Patient denies any chest pain. On exam patient does have mildly elevated JVD. Patient also had 2 episodes of vomiting in the ER. Abdomen appears benign.   Assessment & Plan:   1. Acute respiratory failure with hypoxia probably a combination of CHF and pneumonia - patient has been placed on Lasix 40 mg IV every 12 and on antibiotic ceftriaxone and Zithromax for CHF or pneumonia.  Clinically patient feeling better but still has DOE and orthopnea.   2. Cardiomyopathy / Acute on Chronic Systolic CHF - Appreciate cardiology consultation, will defer further cardiac med mgmt to cardiology team. They have discontinued imdur.  Continue coreg and hydralazine.  See cardiology notes and recommendations.  Pt to have outpatient ischemic workup when medically stabilized.  3. Hypertension - currently on coreg and hydralazine.    4. Acute renal failure - suspect related to poor renal perfusion from systolic heart failure and diuresis, following. Slowly improved. 5. Nausea vomiting- much  improved today, suspect this was related to patient's chemotherapy - abdomen appears benign. Recent CT scan of the abdomen did not show anything acute. If there is further episodes may consider further imaging. 6. Metastatic colon cancer per oncologist. See notes.   DVT prophylaxis: Lovenox. Code Status: Full code.  Family Communication: Husband updated.  Discussed with patient.  Disposition Plan: Home.  Consults called: cardiology, heme/onc  Admission status: Inpatient. Possible dc 11/12.    Subjective: Pt had episode of low BP overnight but feeling much better this morning.   Objective: Vitals:   06/18/16 2112 06/18/16 2219 06/18/16 2324 06/19/16 1126  BP: (!) 125/91 (!) 127/98 118/76 (!) 139/111  Pulse: (!) 106 (!) 10 96 (!) 113  Resp:      Temp:      TempSrc:      SpO2:      Weight:      Height:        Intake/Output Summary (Last 24 hours) at 06/19/16 1408 Last data filed at 06/19/16 0801  Gross per 24 hour  Intake             1340 ml  Output                0 ml  Net             1340 ml   Filed Weights   06/17/16 0046 06/17/16 0500 06/18/16 0500  Weight: 78.8 kg (173 lb 11.6 oz) 78.8 kg (173 lb 11.6 oz) 78.6 kg (173 lb 4.5 oz)    Exam:  Eyes: Anicteric no pallor. ENMT: No discharge from the ears eyes nose and mouth. Neck: JVD elevated no mass felt.  Respiratory: No rhonchi or crepitations. Cardiovascular: S1 and S2 heard no murmurs appreciated. Abdomen: Soft nontender bowel sounds present. No guarding or rigidity. Musculoskeletal: No edema. No joint effusion. Skin: No rash. Skin appears warm. Neurologic: Alert awake oriented to time place and person. Moves all extremities. Psychiatric: Appears normal. Normal affect.  Data Reviewed: Basic Metabolic Panel:  Recent Labs Lab 06/16/16 1944 06/16/16 2326 06/17/16 0321 06/18/16 0636 06/19/16 0500 06/19/16 1205  NA 139  --  140 141 140 142  K 4.0  --  3.6 3.2* 3.6 3.5  CL 106  --  106 103 105 106  CO2  23  --  25 27 27 25   GLUCOSE 124*  --  107* 112* 119* 139*  BUN 13  --  13 17 15 14   CREATININE 1.07* 1.27* 1.25* 1.47* 1.45* 1.40*  CALCIUM 9.4  --  8.9 8.9 9.1 9.2  PHOS  --   --   --   --  4.1  --    Liver Function Tests:  Recent Labs Lab 06/16/16 1944 06/18/16 0636 06/19/16 0500  AST 21 31  --   ALT 34 31  --   ALKPHOS 68 61  --   BILITOT 0.9 0.5  --   PROT 7.2 6.2*  --   ALBUMIN 4.3 3.7 3.9   No results for input(s): LIPASE, AMYLASE in the last 168 hours. No results for input(s): AMMONIA in the last 168 hours. CBC:  Recent Labs Lab 06/16/16 1944 06/16/16 2326 06/17/16 0321 06/19/16 0500  WBC 3.9* 5.1 3.5* 4.6  NEUTROABS 1.5*  --  1.4* 1.7  HGB 12.6 12.3 11.7* 11.4*  HCT 41.5 40.6 39.0 38.1  MCV 74.4* 74.4* 74.6* 75.4*  PLT 443* 478* 411* 491*   Cardiac Enzymes: No results for input(s): CKTOTAL, CKMB, CKMBINDEX, TROPONINI in the last 168 hours. CBG (last 3)   Recent Labs  06/17/16 1947  GLUCAP 235*   Recent Results (from the past 240 hour(s))  Culture, blood (Routine X 2) w Reflex to ID Panel     Status: None (Preliminary result)   Collection Time: 06/16/16  7:50 PM  Result Value Ref Range Status   Specimen Description BLOOD RIGHT HAND  Final   Special Requests BOTTLES DRAWN AEROBIC AND ANAEROBIC 5CC  Final   Culture   Final    NO GROWTH 3 DAYS Performed at [facility]    Report Status PENDING  Incomplete  Culture, blood (Routine X 2) w Reflex to ID Panel     Status: None (Preliminary result)   Collection Time: 06/16/16  8:06 PM  Result Value Ref Range Status   Specimen Description BLOOD PORTA CATH  Final   Special Requests BOTTLES DRAWN AEROBIC AND ANAEROBIC 5CC  Final   Culture   Final    NO GROWTH 3 DAYS Performed at [facility]    Report Status PENDING  Incomplete  MRSA PCR Screening     Status: None   Collection Time: 06/17/16  3:58 AM  Result Value Ref Range Status   MRSA by PCR NEGATIVE NEGATIVE Final    Comment:         The GeneXpert MRSA Assay (FDA approved for NASAL specimens only), is one component of a comprehensive MRSA colonization surveillance program. It is not intended to diagnose MRSA infection nor to guide or monitor treatment for MRSA infections.      Studies: Nm Pulmonary Perf And Vent  Result Date: 06/17/2016 CLINICAL DATA:  Tachycardia.  Shortness of breath. EXAM: NUCLEAR MEDICINE VENTILATION - PERFUSION LUNG SCAN TECHNIQUE: Ventilation images were obtained in multiple projections using inhaled aerosol Tc-40m DTPA. Perfusion images were obtained in multiple projections after intravenous injection of Tc-21m MAA. RADIOPHARMACEUTICALS:  30.2 mCi Technetium-18m DTPA aerosol inhalation and 4.0 mCi Technetium-80m MAA IV COMPARISON:  CT 06/11/2016. FINDINGS: No ventilation perfusion mismatches.  Tiny patchy matched defects. IMPRESSION: Low probability pulmonary embolus. Electronically Signed   By: Marcello Moores  Register   On: 06/17/2016 15:13     Scheduled Meds: . carvedilol  3.125 mg Oral BID WC  . doxycycline  100 mg Oral Q12H  . enoxaparin (LOVENOX) injection  40 mg Subcutaneous QHS  . furosemide  40 mg Intravenous Daily  . hydrALAZINE  10 mg Oral Q8H  . potassium chloride SA  40 mEq Oral BID  . sodium chloride flush  3 mL Intravenous Q12H   Continuous Infusions:  Principal Problem:   Acute respiratory failure with hypoxia (HCC) Active Problems:   Congestive dilated cardiomyopathy (HCC)   Iron deficiency anemia due to chronic blood loss   Hypertension   Metastatic colon cancer to liver [facility])   Community acquired pneumonia of left lower lobe of lung ([place])   Acute on chronic combined systolic and diastolic congestive heart failure [facility])  Critical Care Time spent: 24 mins  Irwin Brakeman, MD, FAAFP [facility] Pager [phone]  If 7PM-7AM, please contact night-coverage [email or web address] Password [email or web address] 06/19/2016, 2:08 PM    LOS: 3 days

## 2016-06-19 NOTE — Progress Notes (Signed)
    Subjective:  Had dyspnea and chest tightness earlier   Objective:  Vitals:   06/18/16 2033 06/18/16 2112 06/18/16 2219 06/18/16 2324  BP: 115/84 (!) 125/91 (!) 127/98 118/76  Pulse: (!) 112 (!) 106 (!) 10 96  Resp: 18     Temp: 97.5 F (36.4 C)     TempSrc: Oral     SpO2: 100%     Weight:      Height:        Intake/Output from previous day:  Intake/Output Summary (Last 24 hours) at 06/19/16 0818 Last data filed at 06/18/16 1851  Gross per 24 hour  Intake             1163 ml  Output              300 ml  Net              863 ml    Physical Exam: Physical exam: Well-developed well-nourished in no acute distress.  Skin is warm and dry.  HEENT is normal.  Neck is supple.  Chest is clear to auscultation with normal expansion.  Cardiovascular exam is regular rate and rhythm.  Abdominal exam nontender or distended. No masses palpated. Extremities show no edema. neuro grossly intact    Lab Results: Basic Metabolic Panel:  Recent Labs  06/18/16 0636 06/19/16 0500  NA 141 140  K 3.2* 3.6  CL 103 105  CO2 27 27  GLUCOSE 112* 119*  BUN 17 15  CREATININE 1.47* 1.45*  CALCIUM 8.9 9.1  PHOS  --  4.1   CBC:  Recent Labs  06/17/16 0321 06/19/16 0500  WBC 3.5* 4.6  NEUTROABS 1.4* 1.7  HGB 11.7* 11.4*  HCT 39.0 38.1  MCV 74.6* 75.4*  PLT 411* 491*     Assessment/Plan:  55 year old female with metastatic colon cancer admitted with acute systolic congestive heart failure, hypertension and pneumonia. Echocardiogram shows newly reduced LV function with ejection fraction 20-25%. Etiology of reduced LV function unclear.  1 acute systolic congestive heart failure-symptoms appear to be improving. Continue present dose of Lasix and follow renal function.  2 newly diagnosed cardiomyopathy-etiology unclear. Chemotherapy seems less likely given agents used for her colon cancer. Question hypertensive related. Question viral. Check TSH. Ischemia needs to be  excluded. Given metastatic colon cancer she would prefer to be conservative. Plan outpatient nuclear study following discharge. Continue low-dose carvedilol. We are holding on ACE inhibitor for now until renal function stabilizes. She did not tolerate isosorbide (became hypotensive with diaphoresis and chest tightness). I will continue hydralazine at present dose and follow blood pressure.   3 acute kidney disease-follow renal function closely with diuresis.   4 Possible pneumonia-antibiotics per primary care.  5 hypertension-continue present medications and follow blood pressure.   6 Stage 4 colon cancer   Christina [name] 06/19/2016, 8:18 AM

## 2016-06-20 MED ORDER — CARVEDILOL 3.125 MG PO TABS: 3.1250 mg | ORAL_TABLET | Freq: Two times a day (BID) | ORAL | 0 refills | Status: DC

## 2016-06-20 MED ORDER — HEPARIN SOD (PORK) LOCK FLUSH 100 UNIT/ML IV SOLN
500.0000 [IU] | INTRAVENOUS | Status: AC | PRN
  Administered 2016-06-20: 500 [IU]

## 2016-06-20 MED ORDER — SPIRONOLACTONE 25 MG PO TABS: 25.0000 mg | ORAL_TABLET | Freq: Every day | ORAL | Status: DC

## 2016-06-20 MED ORDER — HYDRALAZINE HCL 25 MG PO TABS: 25.0000 mg | ORAL_TABLET | Freq: Three times a day (TID) | ORAL | 0 refills | Status: DC

## 2016-06-20 MED ORDER — LISINOPRIL 5 MG PO TABS: 2.5000 mg | ORAL_TABLET | Freq: Every day | ORAL | Status: DC

## 2016-06-20 MED ORDER — FUROSEMIDE 40 MG PO TABS
40.0000 mg | ORAL_TABLET | Freq: Every day | ORAL | Status: DC
Start: 2016-06-20 — End: 2016-06-20

## 2016-06-20 MED ORDER — POTASSIUM CHLORIDE CRYS ER 20 MEQ PO TBCR: 20.0000 meq | EXTENDED_RELEASE_TABLET | Freq: Every day | ORAL | 0 refills | Status: DC

## 2016-06-20 MED ORDER — LOSARTAN POTASSIUM 25 MG PO TABS: 25.0000 mg | ORAL_TABLET | Freq: Every day | ORAL | 0 refills | Status: DC

## 2016-06-20 MED ORDER — FUROSEMIDE 40 MG PO TABS: 40.0000 mg | ORAL_TABLET | Freq: Every day | ORAL | 0 refills | Status: DC

## 2016-06-20 MED ORDER — CARVEDILOL 6.25 MG PO TABS: 6.2500 mg | ORAL_TABLET | Freq: Two times a day (BID) | ORAL | Status: DC

## 2016-06-20 NOTE — Progress Notes (Signed)
Pt leaving this afternoon with her husband. Alert, oriented, and without c/o. Discharge instructions/prescriptions given/explained with pt and spouse verbalizing understanding. Pt aware to pickup prescriptions at said pharmacy. Followup appointments noted. Pt left with laptop, cell phone, clothes, bags.

## 2016-06-20 NOTE — Progress Notes (Addendum)
Subjective:  She feels better since the admission, however SOB while walking.   Objective:  Vitals:   06/19/16 1423 06/19/16 2020 06/20/16 0123 06/20/16 0502  BP: (!) 129/94 (!) 134/101 (!) 133/93 (!) 138/97  Pulse: 97 (!) 102  (!) 101  Resp: 20   18  Temp: 98.2 F (36.8 C) 98.8 F (37.1 C)  97.8 F (36.6 C)  TempSrc: Oral Oral  Oral  SpO2: 99% 95%  97%  Weight:  181 lb 14.1 oz (82.5 kg)  182 lb 1.6 oz (82.6 kg)  Height:        Intake/Output from previous day:  Intake/Output Summary (Last 24 hours) at 06/20/16 1125 Last data filed at 06/20/16 0503  Gross per 24 hour  Intake              360 ml  Output                0 ml  Net              360 ml   . carvedilol  3.125 mg Oral BID WC  . doxycycline  100 mg Oral Q12H  . enoxaparin (LOVENOX) injection  40 mg Subcutaneous QHS  . furosemide  40 mg Intravenous Daily  . hydrALAZINE  10 mg Oral Q8H  . potassium chloride SA  40 mEq Oral BID  . sodium chloride flush  3 mL Intravenous Q12H   Physical Exam: Physical exam: Well-developed well-nourished in no acute distress.  Skin is warm and dry.  HEENT is normal.  Neck is supple.  Chest is clear to auscultation with normal expansion.  Cardiovascular exam is regular rate and rhythm.  Abdominal exam nontender or distended. No masses palpated. Extremities show no edema. neuro grossly intact  Lab Results: Basic Metabolic Panel:  Recent Labs  06/19/16 0500 06/19/16 1205  NA 140 142  K 3.6 3.5  CL 105 106  CO2 27 25  GLUCOSE 119* 139*  BUN 15 14  CREATININE 1.45* 1.40*  CALCIUM 9.1 9.2  PHOS 4.1  --    CBC:  Recent Labs  06/19/16 0500  WBC 4.6  NEUTROABS 1.7  HGB 11.4*  HCT 38.1  MCV 75.4*  PLT 491*   TTE: 06/17/2016 Study Conclusions  - Left ventricle: The cavity size was moderately dilated. Systolic   function was severely reduced. The estimated ejection fraction   was in the range of 20% to 25%. Severe diffuse hypokinesis with   distinct  regional wall motion abnormalities. There is akinesis of   the entireinferior myocardium. There is akinesis of the   basal-midanteroseptal myocardium. Features are consistent with a   pseudonormal left ventricular filling pattern, with concomitant   abnormal relaxation and increased filling pressure (grade 2   diastolic dysfunction). Doppler parameters are consistent with   high ventricular filling pressure. - Aortic valve: Trileaflet; mildly thickened, mildly calcified   leaflets. There was moderate regurgitation. - Mitral valve: There was mild to moderate regurgitation. - Tricuspid valve: There was moderate regurgitation. - Pulmonary arteries: PA peak pressure: 56 mm Hg (S). - Pericardium, extracardiac: There was a left pleural effusion.  Impressions:  - The right ventricular systolic pressure was increased consistent   with moderate pulmonary hypertension.   Assessment/Plan:  55 year old female with metastatic colon cancer admitted with acute systolic congestive heart failure, hypertension and pneumonia. Echocardiogram shows newly reduced LV function with ejection fraction 20-25%. Etiology of reduced LV function unclear.  1 acute systolic congestive heart  failure-symptoms appear to be improving. MInimally fluid overloaded on physical exam.  2 newly diagnosed cardiomyopathy-etiology unclear. Chemotherapy seems less likely given agents used for her colon cancer. Question hypertensive related. Question viral. Check TSH. Ischemia needs to be excluded. Given metastatic colon cancer she would prefer to be conservative. Plan outpatient nuclear study following discharge.   I would increase carvedilol to 6.25 mg po BID, compliance might be a problem as she is refusing some of the meds in the hospital. Add lisinopril 2.5 mg po daily. Switch iv lasix to PO lasix 40 mg daily. Add spironolactone 25 mg po daily. Discontinue hydralazine, she didn't tolerate imdur. If she remains tachycardic we  will consider ivabradin as outpatient.   She can be discharged today, we will arrange for an outpatient follow up within the next 10 days.   3 acute kidney disease-follow renal function closely with diuresis.   4 Possible pneumonia-antibiotics per primary care.  5 hypertension-as above  6 Stage 4 colon cancer   Christina [name] 06/20/2016, 11:25 AM

## 2016-06-20 NOTE — Discharge Summary (Addendum)
Physician Discharge Summary  Christina Chase [id number] DOB: 1961-05-29 DOA: 06/16/2016  PCP: Christina [name]  Admit date: 06/16/2016 Discharge date: 06/20/2016  Admitted From: Home Disposition:  Home   Recommendations for Outpatient Follow-up:  1. Follow up with cardiology as scheduled  2. Follow up with heme/onc 3. Please obtain BMP/CBC in one week 4. Please follow up on the following pending results:  Discharge Condition: Stable  CODE STATUS: FULL Diet recommendation: Heart Healthy  Brief/Interim Summary: Hospital course: HPI: Christina [name] a 55 y.o.femalewith metastatic colon cancer on chemotherapy, hypertension has been recently experiencing shortness of breath. Patient has been having symptoms of bronchitis for last 3 weeks. Patient's oncologist had CT chest and abdomen 5 days ago which showed bilateral pleural effusion. Patient eventually started developing orthopnea and paroxysmal dyspnea and was started on Lasix 4 days ago orally daily. Despite taking which patient was progressively getting short of breath and presented to the ER. Chest x-ray shows pneumonic process. BNP is markedly elevated. Patient was started on antibiotics and Lasix 40 mg IV was given in the ER. Patient is being admitted for acute respiratory failure probably combination of CHF and pneumonia. Patient denies any chest pain. On exam patient does have mildly elevated JVD. Patient also had 2 episodes of vomiting in the ER. Abdomen appears benign.  Assessment & Plan:   1. Acute respiratory failure with hypoxia probably a combination of CHF and pneumonia- patient has been placed on Lasix 40 mg IV every 12 and on antibiotic ceftriaxone and Zithromax for CHF or pneumonia.  She diuresed well.  She was then placed on lasix 40 mg po daily. Clinically patient feeling better but still has DOE and orthopnea.   2. Cardiomyopathy / Acute on Chronic Systolic CHF - Appreciate cardiology consultation, will  defer further cardiac med mgmt to cardiology team. Pt has been refusing to take meds as prescribed and very picky about what she would take which has made managing her cardiac meds difficult.  They have discontinued imdur.  She didn't take the hydralazine regularly.  She would only take low dose carvedilol 3.125 but would not take the 6.25 mg dose.  She declined to take lisinopril and spironolactone that cardiology had prescribed.  She agreed to take losartan, low dose coreg, hydralazine low dose and lasix.  She agreed to follow up with cardiology outpatient for further ischemic workup.  Pt to have outpatient ischemic workup when medically stabilized.  3. Hypertension- currently on coreg and hydralazine.     4. Acute renal failure- suspect related to poor renal perfusion from systolic heart failure and diuresis, following. Slowly improved. 5. Nausea vomiting- much improved today, suspect this was related to patient's chemotherapy - abdomen appears benign. Also patient reported nausea from doxycycline.  Recent CT scan of the abdomen did not show anything acute.  6. Metastatic colon cancer per oncologist. See notes. Pt to follow up with Christina [name] in 2 weeks.    DVT prophylaxis:Lovenox. Code Status:Full code. Family Communication:Christina [name] updated.  Discussed with patient. Disposition Plan:Home. Consults called:cardiology, heme/onc Admission status:Inpatient. Possible dc 11/12.    Discharge Diagnoses:  Principal Problem:   Acute respiratory failure with hypoxia (HCC) Active Problems:   Congestive dilated cardiomyopathy (HCC)   Iron deficiency anemia due to chronic blood loss   Hypertension   Metastatic colon cancer to liver [facility])   Community acquired pneumonia of left lower lobe of lung ([place])   Acute on chronic combined systolic and diastolic congestive heart failure [facility])    Discharge  Instructions  Discharge Instructions    Diet - low sodium heart healthy    Complete by:  As  directed    Increase activity slowly    Complete by:  As directed        Medication List    STOP taking these medications   cyclobenzaprine 10 MG tablet Commonly known as:  FLEXERIL   diclofenac 50 MG EC tablet Commonly known as:  VOLTAREN   levofloxacin 500 MG tablet Commonly known as:  LEVAQUIN   lidocaine-prilocaine cream Commonly known as:  EMLA   LORazepam 0.5 MG tablet Commonly known as:  ATIVAN   NIFEdipine 30 MG 24 hr tablet Commonly known as:  PROCARDIA-XL/ADALAT CC   ondansetron 4 MG tablet Commonly known as:  ZOFRAN     TAKE these medications   albuterol 108 (90 Base) MCG/ACT inhaler Commonly known as:  VENTOLIN HFA Inhale 2 puffs into the lungs every 6 (six) hours as needed for wheezing or shortness of breath.   carvedilol 3.125 MG tablet Commonly known as:  COREG Take 1 tablet (3.125 mg total) by mouth 2 (two) times daily with a meal.   dexamethasone 4 MG tablet Commonly known as:  DECADRON Take 1 tablet (4 mg total) by mouth 2 (two) times daily with a meal. What changed:  when to take this  reasons to take this   furosemide 40 MG tablet Commonly known as:  LASIX Take 1 tablet (40 mg total) by mouth daily. What changed:  medication strength  how much to take   hydrALAZINE 25 MG tablet Commonly known as:  APRESOLINE Take 1 tablet (25 mg total) by mouth 3 (three) times daily.   losartan 25 MG tablet Commonly known as:  COZAAR Take 1 tablet (25 mg total) by mouth daily.   oxyCODONE 5 MG immediate release tablet Commonly known as:  Oxy IR/ROXICODONE Take 1 tablet (5 mg total) by mouth every 6 (six) hours as needed for moderate pain.   potassium chloride SA 20 MEQ tablet Commonly known as:  K-DUR,KLOR-CON Take 1 tablet (20 mEq total) by mouth daily. What changed:  Another medication with the same name was removed. Continue taking this medication, and follow the directions you see here.   prochlorperazine 10 MG tablet Commonly known  as:  COMPAZINE Take 1 tablet (10 mg total) by mouth every 6 (six) hours as needed for nausea or vomiting.      Follow-up Information    [name],[name], [name]. Schedule an appointment as soon as possible for a visit in 2 week(s).   Specialty:  Internal Medicine Contact information: [id number] [place] [address] [place]. Schedule an appointment as soon as possible for a visit in 2 week(s).   Specialty:  Cardiology Why:  Hospital follow Up  Contact information: [id number] [place]       Glori Bickers, [name]. Schedule an appointment as soon as possible for a visit in 2 week(s).   Specialty:  Cardiology Contact information: [address] [place] [address] [phone]        Truitt Merle, [name]. Schedule an appointment as soon as possible for a visit in 2 week(s).   Specialties:  Hematology, Oncology Why:  Hospital Follow Up  Contact information: [place] [address] [phone]          Allergies  Allergen Reactions  . Lactose Intolerance (Gi) Other (See Comments)    Diarrhea, Bloated, Abdominal pain.  . Lorazepam Other (See Comments)    Pt couldn't walk straight, put her in a daze  . Milk-Related Compounds Diarrhea   Procedures/Studies: Dg Chest 2 View  Result Date: 06/16/2016 CLINICAL DATA:  Shortness of breath and nausea.  Chest pain EXAM: CHEST  2 VIEW COMPARISON:  April 08, 2015 FINDINGS: There is patchy consolidation in the posterior left base region. Lungs elsewhere clear. There is cardiomegaly with mild pulmonary venous hypertension. No adenopathy. Port-A-Cath tip is in the superior vena cava. No pneumothorax. No bone lesions. IMPRESSION: Posterior segment left lower lobe airspace consolidation consistent with pneumonia. Cardiomegaly with mild pulmonary venous hypertension  consistent with a degree of pulmonary vascular congestion. No edema. Port-A-Cath tip in superior vena cava.  No pneumothorax. Followup PA and lateral chest radiographs recommended in 3-4 weeks following trial of antibiotic therapy to ensure resolution and exclude underlying malignancy. Electronically Signed   By: Lowella Grip III M.D.   On: 06/16/2016 18:59   Ct Chest W Contrast  Result Date: 06/11/2016 CLINICAL DATA:  Followup metastatic colon carcinoma to liver. Ongoing chemotherapy. Restaging. EXAM: CT CHEST, ABDOMEN, AND PELVIS WITH CONTRAST TECHNIQUE: Multidetector CT imaging of the chest, abdomen and pelvis was performed following the standard protocol during bolus administration of intravenous contrast. CONTRAST:  128mL ISOVUE-300 IOPAMIDOL (ISOVUE-300) INJECTION 61% COMPARISON:  01/14/2016 FINDINGS: CT CHEST FINDINGS Cardiovascular: Stable cardiomegaly. Mediastinum/Lymph Nodes: No masses or pathologically enlarged lymph nodes identified. Tiny hiatal hernia again noted. Lungs/Pleura: Small bilateral pleural effusions are new since previous study. New bibasilar atelectasis seen, left side greater than right. No evidence of pulmonary consolidation or mass. Musculoskeletal: No suspicious bone lesions or other significant abnormality. CT ABDOMEN AND PELVIS FINDINGS Hepatobiliary: Partially calcified mass in segment 2 of the left lobe measures 6.7 x 4.7 cm on image 47/2, without significant change compared to previous study. No other liver masses are identified. Tiny calcified gallstones again seen, without evidence cholecystitis or biliary ductal dilatation. Pancreas:  No mass or inflammatory changes. Spleen:  Within normal limits in size and appearance. Adrenals/Urinary tract:  No masses or hydronephrosis. Stomach/Bowel: No evidence of obstruction, inflammatory process, or abnormal fluid collections. Vascular/Lymphatic: No pathologically enlarged lymph nodes identified. No abdominal aortic aneurysm.  Reproductive: Multiple uterine fibroids remains stable, largest measuring 8.5 cm. No other pelvic masses identified. No evidence of free fluid. Other: Nodular curvilinear soft tissue density in the midline ventral abdominal wall subcutaneous fat remains stable, measuring approximately 1.6 x 2.7 cm on image 55/2. This likely represents postsurgical scarring with tumor seeding a surgical track considered less likely. Musculoskeletal:  No suspicious bone lesions identified. IMPRESSION: New small pleural effusions and bibasilar atelectasis, left side greater than right, of uncertain etiology. No thoracic masses or lymphadenopathy identified. Stable left hepatic lobe metastasis. No new or progressive metastatic disease identified within the abdomen or pelvis. Stable nodular carpal linear soft tissue density in midline ventral abdominal wall subcutaneous fat, which favors postsurgical scarring with tumor seeding of surgical tract considered less likely. Recommend continued attention on follow-up CT. Stable uterine fibroids, cholelithiasis, and tiny hiatal hernia. Electronically Signed   By: Earle Gell M.D.   On: 06/11/2016 17:35   Ct Abdomen Pelvis W Contrast  Result Date: 06/11/2016 CLINICAL DATA:  Followup metastatic colon carcinoma to liver. Ongoing chemotherapy. Restaging. EXAM: CT CHEST, ABDOMEN, AND PELVIS WITH CONTRAST TECHNIQUE: Multidetector CT imaging of the chest, abdomen and pelvis was  performed following the standard protocol during bolus administration of intravenous contrast. CONTRAST:  187mL ISOVUE-300 IOPAMIDOL (ISOVUE-300) INJECTION 61% COMPARISON:  01/14/2016 FINDINGS: CT CHEST FINDINGS Cardiovascular: Stable cardiomegaly. Mediastinum/Lymph Nodes: No masses or pathologically enlarged lymph nodes identified. Tiny hiatal hernia again noted. Lungs/Pleura: Small bilateral pleural effusions are new since previous study. New bibasilar atelectasis seen, left side greater than right. No evidence of  pulmonary consolidation or mass. Musculoskeletal: No suspicious bone lesions or other significant abnormality. CT ABDOMEN AND PELVIS FINDINGS Hepatobiliary: Partially calcified mass in segment 2 of the left lobe measures 6.7 x 4.7 cm on image 47/2, without significant change compared to previous study. No other liver masses are identified. Tiny calcified gallstones again seen, without evidence cholecystitis or biliary ductal dilatation. Pancreas:  No mass or inflammatory changes. Spleen:  Within normal limits in size and appearance. Adrenals/Urinary tract:  No masses or hydronephrosis. Stomach/Bowel: No evidence of obstruction, inflammatory process, or abnormal fluid collections. Vascular/Lymphatic: No pathologically enlarged lymph nodes identified. No abdominal aortic aneurysm. Reproductive: Multiple uterine fibroids remains stable, largest measuring 8.5 cm. No other pelvic masses identified. No evidence of free fluid. Other: Nodular curvilinear soft tissue density in the midline ventral abdominal wall subcutaneous fat remains stable, measuring approximately 1.6 x 2.7 cm on image 55/2. This likely represents postsurgical scarring with tumor seeding a surgical track considered less likely. Musculoskeletal:  No suspicious bone lesions identified. IMPRESSION: New small pleural effusions and bibasilar atelectasis, left side greater than right, of uncertain etiology. No thoracic masses or lymphadenopathy identified. Stable left hepatic lobe metastasis. No new or progressive metastatic disease identified within the abdomen or pelvis. Stable nodular carpal linear soft tissue density in midline ventral abdominal wall subcutaneous fat, which favors postsurgical scarring with tumor seeding of surgical tract considered less likely. Recommend continued attention on follow-up CT. Stable uterine fibroids, cholelithiasis, and tiny hiatal hernia. Electronically Signed   By: Earle Gell M.D.   On: 06/11/2016 17:35   Nm Pulmonary  Perf And Vent  Result Date: 06/17/2016 CLINICAL DATA:  Tachycardia.  Shortness of breath. EXAM: NUCLEAR MEDICINE VENTILATION - PERFUSION LUNG SCAN TECHNIQUE: Ventilation images were obtained in multiple projections using inhaled aerosol Tc-92m DTPA. Perfusion images were obtained in multiple projections after intravenous injection of Tc-62m MAA. RADIOPHARMACEUTICALS:  30.2 mCi Technetium-24m DTPA aerosol inhalation and 4.0 mCi Technetium-78m MAA IV COMPARISON:  CT 06/11/2016. FINDINGS: No ventilation perfusion mismatches.  Tiny patchy matched defects. IMPRESSION: Low probability pulmonary embolus. Electronically Signed   By: Marcello Moores  Register   On: 06/17/2016 15:13    Echocardiogram: Study Conclusions  - Left ventricle: The cavity size was moderately dilated. Systolic function was severely reduced. The estimated ejection fraction was in the range of 20% to 25%. Severe diffuse hypokinesis with distinct regional wall motion abnormalities. There is akinesis of  the entireinferior myocardium. There is akinesis of the basal-midanteroseptal myocardium. Features are consistent with a pseudonormal left ventricular filling pattern, with concomitant   abnormal relaxation and increased filling pressure (grade 2 diastolic dysfunction).  Subjective: Pt feeling better and cleared by cardiology to go home, says she will follow up as recommended.    Discharge Exam: Vitals:   06/20/16 0123 06/20/16 0502  BP: (!) 133/93 (!) 138/97  Pulse:  (!) 101  Resp:  18  Temp:  97.8 F (36.6 C)   Vitals:   06/19/16 1423 06/19/16 2020 06/20/16 0123 06/20/16 0502  BP: (!) 129/94 (!) 134/101 (!) 133/93 (!) 138/97  Pulse: 97 (!) 102  (!) 101  Resp: 20   18  Temp: 98.2 F (36.8 C) 98.8 F (37.1 C)  97.8 F (36.6 C)  TempSrc: Oral Oral  Oral  SpO2: 99% 95%  97%  Weight:  82.5 kg (181 lb 14.1 oz)  82.6 kg (182 lb 1.6 oz)  Height:       General: Pt is alert, awake, not in acute distress Cardiovascular: RRR, S1/S2 +,  no rubs, no gallops Respiratory: CTA bilaterally, no wheezing, no rhonchi Abdominal: Soft, NT, ND, bowel sounds + Extremities: trace pretibial edema, no cyanosis  The results of significant diagnostics from this hospitalization (including imaging, microbiology, ancillary and laboratory) are listed below for reference.     Microbiology: Recent Results (from the past 240 hour(s))  Culture, blood (Routine X 2) w Reflex to ID Panel     Status: None (Preliminary result)   Collection Time: 06/16/16  7:50 PM  Result Value Ref Range Status   Specimen Description BLOOD RIGHT HAND  Final   Special Requests BOTTLES DRAWN AEROBIC AND ANAEROBIC 5CC  Final   Culture   Final    NO GROWTH 3 DAYS Performed at [facility]    Report Status PENDING  Incomplete  Culture, blood (Routine X 2) w Reflex to ID Panel     Status: None (Preliminary result)   Collection Time: 06/16/16  8:06 PM  Result Value Ref Range Status   Specimen Description BLOOD PORTA CATH  Final   Special Requests BOTTLES DRAWN AEROBIC AND ANAEROBIC 5CC  Final   Culture   Final    NO GROWTH 3 DAYS Performed at [facility]    Report Status PENDING  Incomplete  MRSA PCR Screening     Status: None   Collection Time: 06/17/16  3:58 AM  Result Value Ref Range Status   MRSA by PCR NEGATIVE NEGATIVE Final    Comment:        The GeneXpert MRSA Assay (FDA approved for NASAL specimens only), is one component of a comprehensive MRSA colonization surveillance program. It is not intended to diagnose MRSA infection nor to guide or monitor treatment for MRSA infections.      Labs: BNP (last 3 results)  Recent Labs  06/16/16 1944 06/18/16 1430  BNP 2,116.4* [id number]*   Basic Metabolic Panel:  Recent Labs Lab 06/16/16 1944 06/16/16 2326 06/17/16 0321 06/18/16 0636 06/19/16 0500 06/19/16 1205  NA 139  --  140 141 140 142  K 4.0  --  3.6 3.2* 3.6 3.5  CL 106  --  106 103 105 106  CO2 23  --  25 27 27 25    GLUCOSE 124*  --  107* 112* 119* 139*  BUN 13  --  13 17 15 14   CREATININE 1.07* 1.27* 1.25* 1.47* 1.45* 1.40*  CALCIUM 9.4  --  8.9 8.9 9.1 9.2  PHOS  --   --   --   --  4.1  --    Liver Function Tests:  Recent Labs Lab 06/16/16 1944 06/18/16 0636 06/19/16 0500  AST 21 31  --   ALT 34 31  --   ALKPHOS 68 61  --   BILITOT 0.9 0.5  --   PROT 7.2 6.2*  --   ALBUMIN 4.3 3.7 3.9   No results for input(s): LIPASE, AMYLASE in the last 168 hours. No results for input(s): AMMONIA in the last 168 hours. CBC:  Recent Labs Lab 06/16/16 1944 06/16/16 2326 06/17/16 0321 06/19/16 0500  WBC 3.9*  5.1 3.5* 4.6  NEUTROABS 1.5*  --  1.4* 1.7  HGB 12.6 12.3 11.7* 11.4*  HCT 41.5 40.6 39.0 38.1  MCV 74.4* 74.4* 74.6* 75.4*  PLT 443* 478* 411* 491*   Cardiac Enzymes: No results for input(s): CKTOTAL, CKMB, CKMBINDEX, TROPONINI in the last 168 hours. BNP: Invalid input(s): POCBNP CBG:  Recent Labs Lab 06/17/16 1947  GLUCAP 235*   D-Dimer No results for input(s): DDIMER in the last 72 hours. Hgb A1c No results for input(s): HGBA1C in the last 72 hours. Lipid Profile No results for input(s): CHOL, HDL, LDLCALC, TRIG, CHOLHDL, LDLDIRECT in the last 72 hours. Thyroid function studies No results for input(s): TSH, T4TOTAL, T3FREE, THYROIDAB in the last 72 hours.  Invalid input(s): FREET3 Anemia work up No results for input(s): VITAMINB12, FOLATE, FERRITIN, TIBC, IRON, RETICCTPCT in the last 72 hours. Urinalysis    Component Value Date/Time   COLORURINE YELLOW 03/17/2016 2304   APPEARANCEUR CLEAR 03/17/2016 2304   LABSPEC 1.006 03/17/2016 2304   PHURINE 5.0 03/17/2016 2304   GLUCOSEU NEGATIVE 03/17/2016 2304   HGBUR NEGATIVE 03/17/2016 2304   BILIRUBINUR NEGATIVE 03/17/2016 2304   KETONESUR NEGATIVE 03/17/2016 2304   PROTEINUR 30 05/27/2016 0828   NITRITE NEGATIVE 03/17/2016 2304   LEUKOCYTESUR TRACE (A) 03/17/2016 2304   Sepsis Labs Invalid input(s): PROCALCITONIN,   WBC,  LACTICIDVEN Microbiology Recent Results (from the past 240 hour(s))  Culture, blood (Routine X 2) w Reflex to ID Panel     Status: None (Preliminary result)   Collection Time: 06/16/16  7:50 PM  Result Value Ref Range Status   Specimen Description BLOOD RIGHT HAND  Final   Special Requests BOTTLES DRAWN AEROBIC AND ANAEROBIC 5CC  Final   Culture   Final    NO GROWTH 3 DAYS Performed at [facility]    Report Status PENDING  Incomplete  Culture, blood (Routine X 2) w Reflex to ID Panel     Status: None (Preliminary result)   Collection Time: 06/16/16  8:06 PM  Result Value Ref Range Status   Specimen Description BLOOD PORTA CATH  Final   Special Requests BOTTLES DRAWN AEROBIC AND ANAEROBIC 5CC  Final   Culture   Final    NO GROWTH 3 DAYS Performed at [facility]    Report Status PENDING  Incomplete  MRSA PCR Screening     Status: None   Collection Time: 06/17/16  3:58 AM  Result Value Ref Range Status   MRSA by PCR NEGATIVE NEGATIVE Final    Comment:        The GeneXpert MRSA Assay (FDA approved for NASAL specimens only), is one component of a comprehensive MRSA colonization surveillance program. It is not intended to diagnose MRSA infection nor to guide or monitor treatment for MRSA infections.    Time coordinating discharge: 33 mins  SIGNED:  Irwin Brakeman, [name]  [facility] 06/20/2016, 1:12 PM Pager   If 7PM-7AM, please contact night-coverage [email or web address] Password [email or web address]

## 2016-06-20 NOTE — Discharge Instructions (Signed)
Shortness of Breath Shortness of breath means you have trouble breathing. Shortness of breath needs medical care right away. HOME CARE   Do not smoke.  Avoid being around chemicals or things (paint fumes, dust) that may bother your breathing.  Rest as needed. Slowly begin your normal activities.  Only take medicines as told by your doctor.  Keep all doctor visits as told. GET HELP RIGHT AWAY IF:   Your shortness of breath gets worse.  You feel lightheaded, pass out (faint), or have a cough that is not helped by medicine.  You cough up blood.  You have pain with breathing.  You have pain in your chest, arms, shoulders, or belly (abdomen).  You have a fever.  You cannot walk up stairs or exercise the way you normally do.  You do not get better in the time expected.  You have a hard time doing normal activities even with rest.  You have problems with your medicines.  You have any new symptoms. MAKE SURE YOU:  Understand these instructions.  Will watch your condition.  Will get help right away if you are not doing well or get worse.   This information is not intended to replace advice given to you by your health care provider. Make sure you discuss any questions you have with your health care provider.   Document Released: 01/12/2008 Document Revised: 07/31/2013 Document Reviewed: 10/11/2011 Elsevier Interactive Patient Education 2016 [place].  Heart Failure Heart failure means your heart has trouble pumping blood. This makes it hard for your body to work well. Heart failure is usually a long-term (chronic) condition. You must take good care of yourself and follow your doctor's treatment plan. HOME CARE  Take your heart medicine as told by your doctor.  Do not stop taking medicine unless your doctor tells you to.  Do not skip any dose of medicine.  Refill your medicines before they run out.  Take other medicines only as told by your doctor or  pharmacist.  Stay active if told by your doctor. The elderly and people with severe heart failure should talk with a doctor about physical activity.  Eat heart-healthy foods. Choose foods that are without trans fat and are low in saturated fat, cholesterol, and salt (sodium). This includes fresh or frozen fruits and vegetables, fish, lean meats, fat-free or low-fat dairy foods, whole grains, and high-fiber foods. Lentils and dried peas and beans (legumes) are also good choices.  Limit salt if told by your doctor.  Cook in a healthy way. Roast, grill, broil, bake, poach, steam, or stir-fry foods.  Limit fluids as told by your doctor.  Weigh yourself every morning. Do this after you pee (urinate) and before you eat breakfast. Write down your weight to give to your doctor.  Take your blood pressure and write it down if your doctor tells you to.  Ask your doctor how to check your pulse. Check your pulse as told.  Lose weight if told by your doctor.  Stop smoking or chewing tobacco. Do not use gum or patches that help you quit without your doctor's approval.  Schedule and go to doctor visits as told.  Nonpregnant women should have no more than 1 drink a day. Men should have no more than 2 drinks a day. Talk to your doctor about drinking alcohol.  Stop illegal drug use.  Stay current with shots (immunizations).  Manage your health conditions as told by your doctor.  Learn to manage your stress.  Rest when you are tired.  If it is really hot outside:  Avoid intense activities.  Use air conditioning or fans, or get in a cooler place.  Avoid caffeine and alcohol.  Wear loose-fitting, lightweight, and light-colored clothing.  If it is really cold outside:  Avoid intense activities.  Layer your clothing.  Wear mittens or gloves, a hat, and a scarf when going outside.  Avoid alcohol.  Learn about heart failure and get support as needed.  Get help to maintain or improve  your quality of life and your ability to care for yourself as needed. GET HELP IF:   You gain weight quickly.  You are more short of breath than usual.  You cannot do your normal activities.  You tire easily.  You cough more than normal, especially with activity.  You have any or more puffiness (swelling) in areas such as your hands, feet, ankles, or belly (abdomen).  You cannot sleep because it is hard to breathe.  You feel like your heart is beating fast (palpitations).  You get dizzy or light-headed when you stand up. GET HELP RIGHT AWAY IF:   You have trouble breathing.  There is a change in mental status, such as becoming less alert or not being able to focus.  You have chest pain or discomfort.  You faint. MAKE SURE YOU:   Understand these instructions.  Will watch your condition.  Will get help right away if you are not doing well or get worse.   This information is not intended to replace advice given to you by your health care provider. Make sure you discuss any questions you have with your health care provider.   Document Released: 05/04/2008 Document Revised: 08/16/2014 Document Reviewed: 09/11/2012 Elsevier Interactive Patient Education 2016 [place] is a long-term (chronic) disease of the heart muscle (myocardium). Over time, the heart becomes abnormally large, thick, or stiff. This makes it harder for the heart to pump blood and can lead to heart failure. There are several types of cardiomyopathy:  Dilated cardiomyopathy. This type causes the ventricles become large and weak.  Hypertrophic cardiomyopathy. This type causes the heart muscle to thicken.  Restrictive cardiomyopathy. This type causes the heart muscle to become rigid and less elastic.  Ischemic cardiomyopathy. This type involves narrowing arteries that cause the walls of the heart get thinner.  Peripartum cardiomyopathy. This type occurs during  pregnancy or shortly after pregnancy. CAUSES The cause of cardiomyopathy is often not known. In some cases, it is passed down (inherited) from a family member who also had cardiomyopathy. The disease may develop as a complication of another medical condition. These conditions can include:  Diabetes.  High blood pressure.  Viral infection of the heart.  Heart attack.  Coronary heart disease. RISK FACTORS You may be more likely to develop cardiomyopathy if you:  Have a family history of cardiomyopathy or other heart problems.  Are overweight or obese.  Use illegal drugs.  Abuse alcohol.  Have diabetes.  Have another disease that can cause cardiomyopathy as a complication. SIGNS AND SYMPTOMS Often, cardiomyopathy has no signs or symptoms. If you do have symptoms, they may include:  Shortness of breath, especially during activity.  Fatigue.  An irregular heartbeat (arrhythmia).  Dizziness, light-headedness, or fainting.  Chest pain.  Swelling in the lower leg or ankle. DIAGNOSIS Your health care provider may suspect cardiomyopathy based on your symptoms and medical history. Your health care provider will also do  a physical exam. Other tests done may include:  Blood tests.  Imaging studies of your heart. These may be done using:  X-rays to check if your heart is enlarged.  Echocardiogram to show the size of your heart and how well it pumps.  MRI.  A test to record the electrical activity of your heart (electrocardiogram or ECG).  A test in which you wear a portable device (event monitor) to record your heart's electrical activity while you go about your day.  A test to monitor your heart's activity while you exercise (stress test).   A procedure to check the blood pressure and blood flow in your heart(cardiac catheterization).  Injection of dye into your arteries before imaging studies are taken (angiogram).  Removal of a sample of heart tissue (biopsy).  The sample is examined for problems. TREATMENT Treatment depends on the type of cardiomyopathy you have and the severity of your symptoms. If you are not having any symptoms, you might not need treatment. If you need treatment, it may include:  Lifestyle changes.  Quit smoking, if you smoke.  Maintain a healthy weight. Lose weight if directed by your health care provider.  Eat a healthy diet. Include plenty of fruits, vegetables, and whole grains.  Get regular exercise. Ask your health care provider to suggest some activities that are good for you.  Medicine. You may need to take medicine to:  Lower your blood pressure.  Slow down your heart rate.  Keep your heart beating in a steady rhythm.  Clear excess fluids from your body.  Prevent blood clots.  Surgery. You may need surgery to:  Repair a defect.  Remove thickened tissue.  Implant a device to treat serious heart rhythm problems (implantable cardioverter-defibrillator or ICD).  Replace your heart (heart transplant) if all other treatments have failed (end stage). HOME CARE INSTRUCTIONS  Take medicines only as directed by your health care provider.  Eat a heart-healthy diet. Work with your health care provider or a registered dietitian to learn about healthy eating options.  Maintain a healthy weight and stay physically active.  Do not use any tobacco products, including cigarettes, chewing tobacco, or electronic cigarettes. If you need help quitting, ask your health care provider.  Work closely with your health care provider to manage chronic conditions, such as diabetes and high blood pressure.  Limit alcohol intake to no more than one drink per day for nonpregnant women and no more than two drinks per day for men. One drink equals 12 ounces of beer, 5 ounces of wine, or 1 ounces of hard liquor.  Try to get at least 7 hours of sleep each night.  Find ways to manage stress.  Keep all follow-up visits as  directed by your health care provider. This is important. SEEK MEDICAL CARE IF:  Your symptoms get worse, even after treatment.  You have new symptoms. SEEK IMMEDIATE MEDICAL CARE IF:  You have severe chest pain.  You have shortness of breath.  You cough up pink, bubbly material.  You have sudden sweating.  You feel nauseous and vomit.  You suddenly become light-headed or dizzy.  You feel your heart beating very fast.  It feels like your heart is skipping beats. These symptoms may represent a serious problem that is an emergency. Do not wait to see if the symptoms will go away. Get medical help right away. Call your local emergency services (911 in the [place] Do not drive yourself to the hospital.   This  information is not intended to replace advice given to you by your health care provider. Make sure you discuss any questions you have with your health care provider.   Document Released: 10/08/2004 Document Revised: 08/16/2014 Document Reviewed: 01/03/2014 Elsevier Interactive Patient Education [employer].

## 2016-06-21 LAB — CULTURE, BLOOD (ROUTINE X 2)
Culture: NO GROWTH
Culture: NO GROWTH

## 2016-06-22 ENCOUNTER — Other Ambulatory Visit: Payer: Self-pay | Admitting: *Deleted

## 2016-06-22 MED ORDER — ALPRAZOLAM 0.5 MG PO TABS: ORAL_TABLET | ORAL | 0 refills | Status: DC

## 2016-06-22 NOTE — Telephone Encounter (Signed)
Received call from pt stating that she thinks she is having panic attacks.  She feels SOB especially at hs when the lights go out but doesn't think it is anything medical b/c she can eventually calm down & go to sleep.  She states she fees anxious.  Discussed with Dr Burr Medico & script called in for xanax 0.5 mg q 8 hours prn & instructed pt to let us know if this helps. She will also f/u with her cardiologist & PCP.

## 2016-06-24 ENCOUNTER — Ambulatory Visit: Payer: Self-pay

## 2016-06-24 ENCOUNTER — Other Ambulatory Visit: Payer: Self-pay

## 2016-07-07 ENCOUNTER — Encounter (HOSPITAL_COMMUNITY): Payer: Self-pay | Admitting: Internal Medicine

## 2016-07-08 ENCOUNTER — Telehealth: Payer: Self-pay | Admitting: Hematology

## 2016-07-08 ENCOUNTER — Ambulatory Visit (HOSPITAL_BASED_OUTPATIENT_CLINIC_OR_DEPARTMENT_OTHER): Payer: BLUE CROSS/BLUE SHIELD | Admitting: Hematology

## 2016-07-08 ENCOUNTER — Ambulatory Visit: Payer: BLUE CROSS/BLUE SHIELD

## 2016-07-08 ENCOUNTER — Other Ambulatory Visit (HOSPITAL_BASED_OUTPATIENT_CLINIC_OR_DEPARTMENT_OTHER): Payer: BLUE CROSS/BLUE SHIELD

## 2016-07-08 ENCOUNTER — Encounter: Payer: Self-pay | Admitting: Hematology

## 2016-07-08 VITALS — BP 137/100 | HR 100 | Temp 98.1°F | Resp 18 | Ht 66.0 in | Wt 172.9 lb

## 2016-07-08 DIAGNOSIS — D500 Iron deficiency anemia secondary to blood loss (chronic): Secondary | ICD-10-CM

## 2016-07-08 DIAGNOSIS — C787 Secondary malignant neoplasm of liver and intrahepatic bile duct: Secondary | ICD-10-CM | POA: Diagnosis not present

## 2016-07-08 DIAGNOSIS — D509 Iron deficiency anemia, unspecified: Secondary | ICD-10-CM | POA: Diagnosis not present

## 2016-07-08 DIAGNOSIS — I420 Dilated cardiomyopathy: Secondary | ICD-10-CM

## 2016-07-08 DIAGNOSIS — C19 Malignant neoplasm of rectosigmoid junction: Secondary | ICD-10-CM | POA: Diagnosis not present

## 2016-07-08 DIAGNOSIS — C189 Malignant neoplasm of colon, unspecified: Secondary | ICD-10-CM

## 2016-07-08 DIAGNOSIS — J449 Chronic obstructive pulmonary disease, unspecified: Secondary | ICD-10-CM

## 2016-07-08 DIAGNOSIS — Z452 Encounter for adjustment and management of vascular access device: Secondary | ICD-10-CM

## 2016-07-08 DIAGNOSIS — D561 Beta thalassemia: Secondary | ICD-10-CM | POA: Diagnosis not present

## 2016-07-08 DIAGNOSIS — I10 Essential (primary) hypertension: Secondary | ICD-10-CM

## 2016-07-08 DIAGNOSIS — G620 Drug-induced polyneuropathy: Secondary | ICD-10-CM

## 2016-07-08 DIAGNOSIS — Z95828 Presence of other vascular implants and grafts: Secondary | ICD-10-CM

## 2016-07-08 LAB — CBC WITH DIFFERENTIAL/PLATELET
BASO%: 0.7 % (ref 0.0–2.0)
Basophils Absolute: 0 10*3/uL (ref 0.0–0.1)
EOS%: 1.7 % (ref 0.0–7.0)
Eosinophils Absolute: 0.1 10*3/uL (ref 0.0–0.5)
HEMATOCRIT: 43.9 % (ref 34.8–46.6)
HEMOGLOBIN: 13 g/dL (ref 11.6–15.9)
LYMPH#: 1.6 10*3/uL (ref 0.9–3.3)
LYMPH%: 29 % (ref 14.0–49.7)
MCH: 22.8 pg — ABNORMAL LOW (ref 25.1–34.0)
MCHC: 29.6 g/dL — AB (ref 31.5–36.0)
MCV: 77.2 fL — ABNORMAL LOW (ref 79.5–101.0)
MONO#: 0.7 10*3/uL (ref 0.1–0.9)
MONO%: 11.9 % (ref 0.0–14.0)
NEUT#: 3.1 10*3/uL (ref 1.5–6.5)
NEUT%: 56.7 % (ref 38.4–76.8)
Platelets: 487 10*3/uL — ABNORMAL HIGH (ref 145–400)
RBC: 5.69 10*6/uL — ABNORMAL HIGH (ref 3.70–5.45)
RDW: 21.7 % — AB (ref 11.2–14.5)
WBC: 5.4 10*3/uL (ref 3.9–10.3)

## 2016-07-08 LAB — UA PROTEIN, DIPSTICK - CHCC: PROTEIN: 30 mg/dL

## 2016-07-08 LAB — COMPREHENSIVE METABOLIC PANEL
ALT: 22 U/L (ref 0–55)
AST: 20 U/L (ref 5–34)
Albumin: 3.6 g/dL (ref 3.5–5.0)
Alkaline Phosphatase: 66 U/L (ref 40–150)
Anion Gap: 12 mEq/L — ABNORMAL HIGH (ref 3–11)
BUN: 17.4 mg/dL (ref 7.0–26.0)
CALCIUM: 9.6 mg/dL (ref 8.4–10.4)
CHLORIDE: 109 meq/L (ref 98–109)
CO2: 24 mEq/L (ref 22–29)
CREATININE: 1.1 mg/dL (ref 0.6–1.1)
EGFR: 66 mL/min/{1.73_m2} — ABNORMAL LOW (ref >90)
Glucose: 127 mg/dl (ref 70–140)
Potassium: 3.7 mEq/L (ref 3.5–5.1)
Sodium: 145 mEq/L (ref 136–145)
Total Bilirubin: 1.33 mg/dL — ABNORMAL HIGH (ref 0.20–1.20)
Total Protein: 7 g/dL (ref 6.4–8.3)

## 2016-07-08 LAB — CEA (IN HOUSE-CHCC): CEA (CHCC-In House): 109.7 ng/mL — ABNORMAL HIGH (ref 0.00–5.00)

## 2016-07-08 MED ORDER — OXYCODONE HCL 5 MG PO TABS: 5.0000 mg | ORAL_TABLET | Freq: Four times a day (QID) | ORAL | 0 refills | Status: DC | PRN

## 2016-07-08 MED ORDER — HEPARIN SOD (PORK) LOCK FLUSH 100 UNIT/ML IV SOLN
500.0000 [IU] | Freq: Once | INTRAVENOUS | Status: AC | PRN
  Administered 2016-07-08: 500 [IU] via INTRAVENOUS
  Filled 2016-07-08: qty 5

## 2016-07-08 MED ORDER — FUROSEMIDE 40 MG PO TABS: 40.0000 mg | ORAL_TABLET | Freq: Every day | ORAL | 1 refills | Status: DC

## 2016-07-08 NOTE — Progress Notes (Signed)
[name]  [phone] Fax:([phone]) [phone]  Clinic follow up Note   Patient Care Team: Benito Mccreedy, MD as PCP - General (Internal Medicine) Juanita Craver, MD as Consulting Physician (Gastroenterology) Michael Boston, MD as Consulting Physician (General Surgery) Truitt Merle, MD as Consulting Physician (Oncology) 07/08/2016   CHIEF COMPLAINTS:  Follow up metastatic colon cancer      Oncology History   Metastatic colon cancer to liver   Staging form: Colon and Rectum, AJCC 7th Edition     Pathologic stage from 04/08/2015: T3, N0, M1 - Signed by Truitt Merle, MD on 04/22/2015  Presented to ER with intractable N/V; intermittent rectal bleeding X 3 months      Metastatic colon cancer to liver ([name])   04/03/2015 Procedure    Colonoscopy showed a obstructing sigmoid rectal mass. Biopsy showed adenocarcinoma.      04/06/2015 Tumor Marker    CEA=467.3 / CA19.9=1605      04/06/2015 Imaging    CT chest, abdomen and pelvis with contrast showed sigmoid colon rectal mass, multiple (4) liver metastasis, with the largest 9.1 x 6.1 cm mass in the left lobe..      04/08/2015 Miscellaneous    Foundation one genomic testing showed NRAS G60e and BRAF [id number] mutations      04/08/2015 Initial Diagnosis    Metastatic colon cancer to liver      04/08/2015 Surgery    Laparoscopic sigmoid colectomy, liver biopsy, port cath insertion, by Dr. Johney Maine       04/08/2015 Pathology Results    Sigmoid colon segmental resection showed adenocarcinoma with mucinous features, pT 3, 15 lymph nodes all negative, surgical margins were negative. Liver biopsy showed metastatic adenocarcinoma.      05/08/2015 -  Chemotherapy    FOLFIRI every 2 weeks, Avastin was added on from second cycle       07/14/2015 Imaging    CT abdomen and pelvis showed interval slight improvement in liver metastasis. No other new lesions.      10/12/2015 Imaging     CT abdomen and pelvis with contrast showed  continued interval decrease in size of multiple hepatic metastases,  no other new lesions.      06/16/2016 - 06/20/2016 Hospital Admission    Pt was admitted for pneumonia and acute on chronic CHF        HISTORY OF PRESENTING ILLNESS:  Christina [name] 55 y.o. female with past medical history of endometriosis, uterine fibroids, thalassemia trait, iron deficient anemia, who was recently found to have a sigmoid rectal cancer. I initially saw her in the hospital, she is here for the first follow-up.  She has been having intermittent rectal bleeding for the past 3 months. She thought it was related to her endometriosis, did not seek medical attention. She also has intermittent mild nausea, especially in the morning, and left lower quadrant abdominal pain. She was found to have profound anemia with hemoglobin 6.5 last week, and received blood transfusion. She was referred to gastroenterologist Dr. Collene Mares last week, underwent colonoscopy 2 days ago, which showed a obstructing sigmoid rectal mass, per patient, the colonoscopy report is not available today. Biopsy was done, but the pathology report is still pending.  She developed severe nausea, and vomited several times with clear gastric liquid. She called [name], and I was told to come to [facility] emergency room by on-call physician Dr. Carlean Purl. She had a CT scan done in the emergency room today.  Her appetite was fairly normal  up to 2 days ago before recent hospital admission, when she was found to have a colorectal mass. She lost about 45 pounds in the past few weeks. 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 family history was positive for colon cancer in her maternal cousin. She is married, lives with her husband, no children.  CURRENT THERAPY: FOLFIRI and Avastin every 2 weeks, started on 05/08/2015, dose reduction and neulasta added from cycle 7 due to prolonged neutropenia, held since  cycle 24 due to lack of insurance coverage. Chemo held since 06/10/2016 due to CHF   INTERIM HISTORY [name] returns for follow-up. She was admitted to hospital on 06/16/2016 for pneumonia and CHF, echo showed EF of 20-25%. She was seen by cardiology service, and was treated with diuretics. Her symptoms improved, and she was discharged home on 06/20/2016. She went back to work for 5 days later, and developed worsening dyspnea on exertion, especially after drinking water. She was seen by cardiologist in her hospital, received IV Lasix, and started wearing life jacket. She still has mild to moderate dyspnea on exertion, not able to tolerate much liquid by mouth due to the dyspnea, no leg swollen, mild cough, no chest pain. She complains of worsening epigastric pain from the subcutaneous nodule, which seems to be growing lately. No other significant new pain.  MEDICAL HISTORY:  Past Medical History:  Diagnosis Date  . Endometriosis   . Hypertension   . met colon ca to liver dx'd 03/2015  . Thalassanemia     SURGICAL HISTORY: Past Surgical History:  Procedure Laterality Date  . COLON RESECTION N/A 04/08/2015   Procedure: LAPAROSCOPIC  RESECTION OF PART OF  SIGMOID COLON;  Surgeon: Michael Boston, MD;  Location: [facility];  Service: General;  Laterality: N/A;  . DIAGNOSTIC LAPAROSCOPY     Endometriosis  . LAPAROSCOPIC SIGMOID COLECTOMY  04/08/2015   for colorectal cancer  . LIVER BIOPSY N/A 04/08/2015   Procedure: CORE NEEDLE LIVER BIOPSY;  Surgeon: Michael Boston, MD;  Location: [facility];  Service: General;  Laterality: N/A;  . MYOMECTOMY     Gyn in [place]  . PORTACATH PLACEMENT N/A 04/08/2015   Procedure: INSERTION PORT-A-CATH;  Surgeon: Michael Boston, MD;  Location: [facility];  Service: General;  Laterality: N/A;    SOCIAL HISTORY: Social History   Social History  . Marital status: Married    Spouse name: N/A  . Number of children: N/A  . Years of education: N/A   Occupational History    . 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 Smoking status: Never Smoker  . Smokeless tobacco: Never Used  . Alcohol use Yes     Comment: Once every four months.   . Drug use: No  . Sexual activity: Not on file   Other Topics Concern  . Not on file   Social History Narrative   Married, husband [name] (married X 2 years)   No children   IM Physician in [place]: Family History  Problem Relation Age of Onset  . Anesthesia problems Cousin 55    maternal cousin, colon cancer   . Clotting disorder Maternal Grandmother     ALLERGIES:  is allergic to lactose intolerance (gi); lasix [furosemide]; lorazepam; and milk-related compounds.  MEDICATIONS:  Current Outpatient Prescriptions  Medication Sig Dispense Refill  . albuterol (VENTOLIN HFA) 108 (90 Base) MCG/ACT inhaler  Inhale 2 puffs into the lungs every 6 (six) hours as needed for wheezing or shortness of breath. 1 Inhaler 0  . carvedilol (COREG) 6.25 MG tablet Take 6.25 mg by mouth 2 (two) times daily with a meal.    . dexamethasone (DECADRON) 4 MG tablet Take 1 tablet (4 mg total) by mouth 2 (two) times daily with a meal. (Patient taking differently: Take 4 mg by mouth 2 (two) times daily as needed (nausea from chemotherapy). ) 10 tablet 1  . eplerenone (INSPRA) 25 MG tablet Take 25 mg by mouth daily.    . furosemide (LASIX) 40 MG tablet Take 1 tablet (40 mg total) by mouth daily. 30 tablet 1  . losartan (COZAAR) 25 MG tablet Take 1 tablet (25 mg total) by mouth daily. 30 tablet 0  . oxyCODONE (OXY IR/ROXICODONE) 5 MG immediate release tablet Take 1 tablet (5 mg total) by mouth every 6 (six) hours as needed for moderate pain. 30 tablet 0  . potassium chloride SA (K-DUR,KLOR-CON) 20 MEQ tablet Take 1 tablet (20 mEq total) by mouth daily. 30 tablet 0  . prochlorperazine (COMPAZINE) 10 MG tablet Take 1 tablet (10 mg total) by mouth every 6 (six) hours as needed  for nausea or vomiting. 30 tablet 1  . ALPRAZolam (XANAX) 0.5 MG tablet 1 tab by mouth every 8 hours prn anxiety or rest. (Patient not taking: Reported on 07/08/2016) 30 tablet 0  . hydrALAZINE (APRESOLINE) 25 MG tablet Take 1 tablet (25 mg total) by mouth 3 (three) times daily. (Patient not taking: Reported on 07/08/2016) 90 tablet 0   No current facility-administered medications for this visit.     REVIEW OF SYSTEMS:   Constitutional: Denies fevers, chills or abnormal night sweats, (+) fatigue Eyes: Denies blurriness of vision, double vision or watery eyes Ears, nose, mouth, throat, and face: Denies mucositis or sore throat Respiratory: Denies cough, dyspnea or wheezes, (+) mild congestion and dyspnea Cardiovascular: Denies palpitation, chest discomfort or lower extremity swelling Gastrointestinal:  (+) nausea, (+) Epigastric pain from the subcutaneous nodule, and mild constipation Skin: Denies abnormal skin rashes Lymphatics: Denies new lymphadenopathy or easy bruising Neurological:Denies numbness, tingling or new weaknesses, (+) intermittent right flank pain Behavioral/Psych: Mood is stable, no new changes  All other systems were reviewed with the patient and are negative.  PHYSICAL EXAMINATION: ECOG PERFORMANCE STATUS: 2 BP (!) 137/100 (BP Location: Right Arm, Patient Position: Sitting) Comment: Second reading in the right arm. Made the nusre Myrtle aware of the elevated BP.  Pulse 100   Temp 98.1 F (36.7 C) (Oral)   Resp 18   Ht 5' 6"  (1.676 m)   Wt 172 lb 14.4 oz (78.4 kg)   SpO2 100%   BMI 27.91 kg/m   GENERAL:alert, no distress and comfortable SKIN: skin color, texture, turgor are normal, no rashes or significant lesions EYES: normal, conjunctiva are pink and non-injected, sclera clear OROPHARYNX:no exudate, no erythema and lips, buccal mucosa, and tongue normal  NECK: supple, thyroid normal size, non-tender, without nodularity LYMPH:  no palpable lymphadenopathy in the  cervical, axillary or inguinal LUNGS: clear to auscultation and percussion with normal breathing effort. She is mildly tachypenic when she talks, we checked her pulse ox after 5 minutes walking, which was 99%. HEART: regular rate & rhythm and no murmurs and no lower extremity edema ABDOMEN:abdomen soft, non-tender and normal bowel sounds.  There is a 2.5cm subcutaneous nodule in the epigastric area, tender, no skin erythema Musculoskeletal:no cyanosis of digits and  no clubbing  PSYCH: alert & oriented x 3 with fluent speech NEURO: no focal motor/sensory deficitsall  LABORATORY DATA:  I have reviewed the data as listed CBC Latest Ref Rng & Units 07/08/2016 06/19/2016 06/17/2016  WBC 3.9 - 10.3 10e3/uL 5.4 4.6 3.5(L)  Hemoglobin 11.6 - 15.9 g/dL 13.0 11.4(L) 11.7(L)  Hematocrit 34.8 - 46.6 % 43.9 38.1 39.0  Platelets 145 - 400 10e3/uL 487(H) 491(H) 411(H)    CMP Latest Ref Rng & Units 07/08/2016 06/19/2016 06/19/2016  Glucose 70 - 140 mg/dl 127 139(H) 119(H)  BUN 7.0 - 26.0 mg/dL 17.4 14 15   Creatinine 0.6 - 1.1 mg/dL 1.1 1.40(H) 1.45(H)  Sodium 136 - 145 mEq/L 145 142 140  Potassium 3.5 - 5.1 mEq/L 3.7 3.5 3.6  Chloride 101 - 111 mmol/L - 106 105  CO2 22 - 29 mEq/L 24 25 27   Calcium 8.4 - 10.4 mg/dL 9.6 9.2 9.1  Total Protein 6.4 - 8.3 g/dL 7.0 - -  Total Bilirubin 0.20 - 1.20 mg/dL 1.33(H) - -  Alkaline Phos 40 - 150 U/L 66 - -  AST 5 - 34 U/L 20 - -  ALT 0 - 55 U/L 22 - -    Results for CHARNAE, LILL (MRN [id number]) as of 06/10/2016 16:27  Ref. Range 03/18/2016 09:19 06/10/2016 10:49  Iron Latest Ref Range: 41 - 142 ug/dL 45 41  UIBC Latest Ref Range: 120 - 384 ug/dL 324 272  TIBC Latest Ref Range: 236 - 444 ug/dL 369 314  %SAT Latest Ref Range: 21 - 57 % 12 (L) 13 (L)  Ferritin Latest Ref Range: 9 - 269 ng/ml 223 72   CEA  06/19/2015: 457.8 08/14/2015: 442.6 12/04/2015: 293.1 03/04/2016: 132.1 04/15/2016: 140.7 05/27/2016: 122.56 07/08/2016: 109.7   Diagnosis  04/08/2015 1. Liver, needle/core biopsy, ? cancer - METASTATIC ADENOCARCINOMA. 2. Colon, segmental resection for tumor, sigmoid colon mass open end proximal - COLONIC ADENOCARCINOMA WITH MUCINOUS FEATURES EXTENDING INTO PERICOLONIC ADIPOSE TISSUE AND SUBSEROSAL CONNECTIVE TISSUE. - MARGINS NOT INVOLVED. - FIFTEEN BENIGN LYMPH NODES (0/15). 3. Colon, resection margin (donut), distal anastomic ring - BENIGN COLON. - NO EVIDENCE OF MALIGNANCY.  Microscopic Comment Specimen: Sigmoid colon with liver biopsy and anastomotic rings. Procedure: Segmental resection with liver biopsy. Tumor site: Distal sigmoid. Specimen integrity: Intact. Macroscopic intactness of mesorectum: N/A Macroscopic tumor perforation: No. Invasive tumor: Maximum size: 8 cm Histologic type(s): Colorectal adenocarcinoma with mucinous features. Histologic grade and differentiation: G2: moderately differentiated/low grade Type of polyp in which invasive carcinoma arose: No residual polyp. Microscopic extension of invasive tumor: Into pericolonic adipose tissue and subserosal connective tissue. Lymph-Vascular invasion: Present. Peri-neural invasion: Present. Tumor deposit(s) (discontinuous extramural extension): No. Resection margins: Proximal margin: Free of tumor. Distal margin: Free of tumor. Circumferential (radial) (posterior ascending, posterior descending; lateral and posterior mid-rectum; and entire lower 1/3 rectum): N/A Mesenteric margin (sigmoid and transverse): Free of tumor. Distance closest margin (if all above margins negative): 2 cm from mesenteric margin. Treatment effect (neo-adjuvant therapy): No. Additional polyp(s): No. Non-neoplastic findings: None. Lymph nodes: number examined - 15; number positive: 0 Pathologic Staging: pT3, pN0, pM1 Ancillary studies: MMR pending. (JDP:gt, 04/10/15) 2. Mismatch Repair (MMR) Protein Immunohistochemistry (IHC) IHC Expression Result: MLH1: Preserved nuclear  expression (greater 50% tumor expression) MSH2: Preserved nuclear expression (greater 50% tumor expression) MSH6: Preserved nuclear expression (greater 50% tumor expression) PMS2: Preserved nuclear expression (greater 50% tumor expression) * Internal control demonstrates intact nuclear expression Interpretation: NORMAL   FoundationOne test result   RADIOGRAPHIC STUDIES: I  have personally reviewed the radiological images as listed and agreed with the findings in the report.  Ct chest, abdomen and pelvis W Contrast 06/11/2016 IMPRESSION: New small pleural effusions and bibasilar atelectasis, left side greater than right, of uncertain etiology. No thoracic masses or lymphadenopathy identified.  Stable left hepatic lobe metastasis. No new or progressive metastatic disease identified within the abdomen or pelvis.  Stable nodular carpal linear soft tissue density in midline ventral abdominal wall subcutaneous fat, which favors postsurgical scarring with tumor seeding of surgical tract considered less likely. Recommend continued attention on follow-up CT.  Stable uterine fibroids, cholelithiasis, and tiny hiatal hernia.  Bone scan 03/31/2016 IMPRESSION: Negative bone scan.  ASSESSMENT & PLAN:  55 year old African-American female, with past medical history of hypertension, thalassemia trait, iron deficient anemia, uterine fibroids, endometriosis, who presents with intermittent rectal bleeding, mild nausea, and mild intermittent left lower quadrant abdominal pain.   1. Sigmoid rectal adenocarcinoma, with liver metastases, pT3N0M1, stage IV, MMR normal, NRAS and BRAF mutation (+)  -I reviewed her surgical pathology findings with her in great details. Her liver biopsy confirmed metastatic adenocarcinoma  From colon cancer. Her primary sigmoid rectal tumor has been completely removed. -We again reviewed her CT findings, it showed at least 4 liver metastatic lesion, with the largest one  measuring 9.1 cm in the left lobe. No other distant metastasis -We reviewed the natural history of metastatic colon cancer, and incurable nature at this is stage, and palliative goal of therapy-Her tumor has BRAF mutation, which is a poor prognostic factor  -Her tumor has NRAS mutation, not a candidate for EGFR inhibitor  -Given the MSI-stable, she might not benefit from immunotherapy, except in a clinically trial setting  -I reviewed her restaging CT scan from 01/14/2016, which showed slight improvement in liver mets, no other new lesions, she has had a good partial response so far.  - her tumor marker CEA has been trending down lately, corresponding to good response to chemotherapy -I discussed her recent bone scan, which showed no evidence of bone metastasis. -Her muscle spasm and pain has resolved after she stopped omeprazole. -Her recent restaging CT scan from 06/11/2016 showed stable liver metastasis, but no other new cancer progression. -Due to her significant CHF, inability to tolerate IV fluids, I'll hold her IV chemotherapy for now. -I may consider switching her chemotherapy to keep setting and Avastin in December if she still has symptomatic CHF.   2. CHF, with EF 20-25% -She has developed a symptomatic CHF in earlier December 2017, etiology is unclear, possible cuddy myopathy from hypertension, versus infection induced cardiomyopathy, or chemotherapy related cardiomyopathy -She is on Lasix, managed by her cardiologist, still quite symptomatic.  3. Epigastric abdomen pain from the subcutaneous lesion -She has developed a worsening pain from the epigastric subcutaneous lesion, which has been growing lately, likely a metastatic lesion -I recommend her to consider ultrasound-guided needle biopsy to confirm metastasis, and we will consider surgical resection or radiation for palliation, will discuss with Dr. Johney Maine    4 Iron deficient Anemia, and beta thalassemia trait  -Secondary to GI  bleeding and iron deficiency -Ferritin level was 8, consistent with iron deficiency from GI bleeding -She received Feraheme 510 mg twice,  responded very well. Her anemia resolved, even she is getting chemotherapy.  -her repeated iron study have showed normal iron level and ferritin  5. Hypertension -She has no history of hypertension. Blood pressure has been slightly elevated in the past months, repeated normal. Possibly related to  Avastin -I'll continue monitoring her blood pressure closely.  -Follow up with her cardiologist  6. Peripheral neuropathy, G1 -Secondary to chemotherapy, which has been mild and stable, usually resolves before her next cycle chemo  -we'll watch closely. If gets worse, we'll consider dose reduction. -I recommend her to try Neurontin, she declined due to her sensitivity to sedative medication  Plan -We'll hold chemotherapy for now due to her symptomatic CHF and inability to tolerate IV fluids -IR ultrasound guided biopsy of the epigastric subcutaneous nodule -I'll see her back in 2 weeks, consider switching to Xeloda and Avastin afterwards  All questions were answered. The patient knows to call the clinic with any problems, questions or concerns. I spent 30 minutes counseling the patient face to face. The total time spent in the appointment was 35 minutes and more than 50% was on counseling.     Truitt Merle, MD 07/08/2016

## 2016-07-08 NOTE — Telephone Encounter (Signed)
lvm to inform pt of 2 week follow up 12/14 per LOS

## 2016-07-20 ENCOUNTER — Other Ambulatory Visit: Payer: Self-pay | Admitting: General Surgery

## 2016-07-21 ENCOUNTER — Other Ambulatory Visit: Payer: Self-pay | Admitting: Hematology

## 2016-07-21 ENCOUNTER — Ambulatory Visit (HOSPITAL_COMMUNITY)
Admission: RE | Admit: 2016-07-21 | Discharge: 2016-07-21 | Disposition: A | Discharge status: Home / Self Care | Payer: BLUE CROSS/BLUE SHIELD | Source: Ambulatory Visit | Attending: Hematology | Admitting: Hematology

## 2016-07-21 ENCOUNTER — Encounter (HOSPITAL_COMMUNITY): Payer: Self-pay

## 2016-07-21 DIAGNOSIS — C189 Malignant neoplasm of colon, unspecified: Secondary | ICD-10-CM | POA: Diagnosis present

## 2016-07-21 DIAGNOSIS — C792 Secondary malignant neoplasm of skin: Secondary | ICD-10-CM | POA: Diagnosis not present

## 2016-07-21 DIAGNOSIS — C787 Secondary malignant neoplasm of liver and intrahepatic bile duct: Secondary | ICD-10-CM | POA: Insufficient documentation

## 2016-07-21 LAB — CBC
HCT: 47.2 % — ABNORMAL HIGH (ref 36.0–46.0)
HEMOGLOBIN: 14.1 g/dL (ref 12.0–15.0)
MCH: 22.3 pg — AB (ref 26.0–34.0)
MCHC: 29.9 g/dL — AB (ref 30.0–36.0)
MCV: 74.8 fL — ABNORMAL LOW (ref 78.0–100.0)
PLATELETS: 383 10*3/uL (ref 150–400)
RBC: 6.31 MIL/uL — AB (ref 3.87–5.11)
RDW: 20.5 % — ABNORMAL HIGH (ref 11.5–15.5)
WBC: 7 10*3/uL (ref 4.0–10.5)

## 2016-07-21 MED ORDER — SODIUM CHLORIDE 0.9 % IV SOLN: INTRAVENOUS | Status: DC

## 2016-07-21 NOTE — Procedures (Signed)
US guided core biopsies of subcutaneous nodule in upper abdomen. 4 - 18 gauge core biopsies obtained.  No immediate complication.

## 2016-07-21 NOTE — Progress Notes (Signed)
Patient ID: Christina [name], female   DOB: 04-29-1961, 55 y.o.   MRN: [id number]   Christina [name] is a 55 year old female with history of endometriosis, uterine fibroids, thalassemia train, IDA, and metastatic colon cancer.  Patient underwent surgical resection of her obstructing colon cancer in August 2016 at which time the sigmoid colon was removed and liver lesion was biopsied.  She has been undergoing chemotherapy for her colon cancer with liver mets.  Over the past 1 year patient has noticed a hard nodules/mass which has progressively enlarged.  Request is made today for biopsy of the subcutaneous nodule. Of note, patient was recently hospitalized with acute respiratory failure thought to be a combination of pneumonia and new onset systolic and diastolic heart failure.  Once she returned home to [place], her cardiologist suggested she wear a life vest.  She is wearing the vest today.   Patient presents today for biopsy of a subcutaneous nodule in the epigastric area with ultrasound.  Due to cardiac history, patient requests no sedation.   Case and imaging reviewed with Dr. Anselm Pancoast who feels patient is appropriate candidate for US-guided biopsy of subcutaneous mass/nodule with local anesthesia only.  Brynda Greathouse, MS RD PA-C 07/21/16

## 2016-07-21 NOTE — Progress Notes (Signed)
[place]  [phone] [phone]  Clinic follow up Note   Patient Care Team: Benito Mccreedy, MD as PCP - General (Internal Medicine) Juanita Craver, MD as Consulting Physician (Gastroenterology) Michael Boston, MD as Consulting Physician (General Surgery) Truitt Merle, MD as Consulting Physician (Oncology) 07/22/2016   CHIEF COMPLAINTS:  Follow up metastatic colon cancer      Oncology History   Metastatic colon cancer to liver   Staging form: Colon and Rectum, AJCC 7th Edition     Pathologic stage from 04/08/2015: T3, N0, M1 - Signed by Truitt Merle, MD on 04/22/2015  Presented to ER with intractable N/V; intermittent rectal bleeding X 3 months      Metastatic colon cancer to liver ([place])   04/03/2015 Procedure    Colonoscopy showed a obstructing sigmoid rectal mass. Biopsy showed adenocarcinoma.      04/06/2015 Tumor Marker    CEA=467.3 / CA19.9=1605      04/06/2015 Imaging    CT chest, abdomen and pelvis with contrast showed sigmoid colon rectal mass, multiple (4) liver metastasis, with the largest 9.1 x 6.1 cm mass in the left lobe..      04/08/2015 Miscellaneous    Foundation one genomic testing showed NRAS G60e and BRAF [id number] mutations      04/08/2015 Initial Diagnosis    Metastatic colon cancer to liver      04/08/2015 Surgery    Laparoscopic sigmoid colectomy, liver biopsy, port cath insertion, by Dr. Johney Maine       04/08/2015 Pathology Results    Sigmoid colon segmental resection showed adenocarcinoma with mucinous features, pT 3, 15 lymph nodes all negative, surgical margins were negative. Liver biopsy showed metastatic adenocarcinoma.      05/08/2015 -  Chemotherapy    FOLFIRI every 2 weeks, Avastin was added on from second cycle       07/14/2015 Imaging    CT abdomen and pelvis showed interval slight improvement in liver metastasis. No other new lesions.      10/12/2015 Imaging     CT abdomen and pelvis with contrast showed  continued interval decrease in size of multiple hepatic metastases,  no other new lesions.      06/11/2016 Imaging    Ct chest, abdomen and pelvis W Contrast IMPRESSION: New small pleural effusions and bibasilar atelectasis, left side greater than right, of uncertain etiology. No thoracic masses or lymphadenopathy identified.  Stable left hepatic lobe metastasis. No new or progressive metastatic disease identified within the abdomen or pelvis.  Stable nodular carpal linear soft tissue density in midline ventral abdominal wall subcutaneous fat, which favors postsurgical scarring with tumor seeding of surgical tract considered less likely. Recommend continued attention on follow-up CT.  Stable uterine fibroids, cholelithiasis, and tiny hiatal hernia.       06/16/2016 - 06/20/2016 Hospital Admission    Pt was admitted for pneumonia and acute on chronic CHF      07/21/2016 Pathology Results    US Biopsy Diagnosis Soft Tissue Needle Core Biopsy, upper anterior abdomen - ADENOCARCINOMA, CONSISTENT WITH COLONIC PRIMARY.        HISTORY OF PRESENTING ILLNESS:  Christina [name] 55 y.o. female with past medical history of endometriosis, uterine fibroids, thalassemia trait, iron deficient anemia, who was recently found to have a sigmoid rectal cancer. I initially saw her in the hospital, she is here for the first follow-up.  She has been having intermittent rectal bleeding for the past 3 months. She thought it was related to her  endometriosis, did not seek medical attention. She also has intermittent mild nausea, especially in the morning, and left lower quadrant abdominal pain. She was found to have profound anemia with hemoglobin 6.5 last week, and received blood transfusion. She was referred to gastroenterologist Dr. Collene Mares last week, underwent colonoscopy 2 days ago, which showed a obstructing sigmoid rectal mass, per patient, the colonoscopy report is not available today. Biopsy was  done, but the pathology report is still pending.  She developed severe nausea, and vomited several times with clear gastric liquid. She called [name], and I was told to come to [facility] emergency room by on-call physician Dr. Carlean Purl. She had a CT scan done in the emergency room today.  Her appetite was fairly normal up to 2 days ago before recent hospital admission, when she was found to have a colorectal mass. She lost about 45 pounds in the past few weeks. 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 family history was positive for colon cancer in her maternal cousin. She is married, lives with her husband, no children.  CURRENT THERAPY: FOLFIRI and Avastin every 2 weeks, started on 05/08/2015, dose reduction and neulasta added from cycle 7 due to prolonged neutropenia, held since cycle 24 due to lack of insurance coverage. Chemo held since 06/10/2016 due to CHF.   INTERIM HISTORY [name] returns for follow-up. She overall feels better. The patient complains of fatigue, and still can not tolerate much fluids. She reports that when she drinks, she starts to cough. She is trying to start becoming active again. She reports that eating is a problem due to lack of appetite. She reports SOB. She stopped taking boost. She will try to increase her fluid and food intake. The patient had an US Biopsy on 07/21/16 of the upper anterior abdomen. This revealed ADENOCARCINOMA, CONSISTENT WITH COLONIC PRIMARY. The patient reports abdominal pain of the biopsy site.  MEDICAL HISTORY:  Past Medical History:  Diagnosis Date  . Endometriosis   . Hypertension   . met colon ca to liver dx'd 03/2015  . Thalassanemia     SURGICAL HISTORY: Past Surgical History:  Procedure Laterality Date  . COLON RESECTION N/A 04/08/2015   Procedure: LAPAROSCOPIC  RESECTION OF PART OF  SIGMOID COLON;  Surgeon: Michael Boston, MD;  Location: [facility];  Service: General;   Laterality: N/A;  . DIAGNOSTIC LAPAROSCOPY     Endometriosis  . LAPAROSCOPIC SIGMOID COLECTOMY  04/08/2015   for colorectal cancer  . LIVER BIOPSY N/A 04/08/2015   Procedure: CORE NEEDLE LIVER BIOPSY;  Surgeon: Michael Boston, MD;  Location: [facility];  Service: General;  Laterality: N/A;  . MYOMECTOMY     Gyn in [place]  . PORTACATH PLACEMENT N/A 04/08/2015   Procedure: INSERTION PORT-A-CATH;  Surgeon: Michael Boston, MD;  Location: [facility];  Service: General;  Laterality: N/A;    SOCIAL HISTORY: Social History   Social History  . Marital status: Married    Spouse name: N/A  . Number of children: N/A  . Years of education: N/A   Occupational History  . 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 Smoking status: Never Smoker  . Smokeless tobacco: Never Used  . Alcohol use Yes     Comment: Once every four months.   . Drug use: No  . Sexual activity: Not on file   Other Topics Concern  . Not on  file   Social History Narrative   Married, husband [name] (married X 2 years)   No children   IM Physician in [place]: Family History  Problem Relation Age of Onset  . Anesthesia problems Cousin 59    maternal cousin, colon cancer   . Clotting disorder Maternal Grandmother     ALLERGIES:  is allergic to lactose intolerance (gi); lasix [furosemide]; lorazepam; and milk-related compounds.  MEDICATIONS:  Current Outpatient Prescriptions  Medication Sig Dispense Refill  . albuterol (VENTOLIN HFA) 108 (90 Base) MCG/ACT inhaler Inhale 2 puffs into the lungs every 6 (six) hours as needed for wheezing or shortness of breath. 1 Inhaler 0  . capecitabine (XELODA) 500 MG tablet Take 4 tablets (2,000 mg total) by mouth 2 (two) times daily after a meal. 112 tablet 1  . carvedilol (COREG) 6.25 MG tablet Take 6.25 mg by mouth 2 (two) times daily with a meal.    . dexamethasone (DECADRON) 4 MG tablet Take 1 tablet (4 mg  total) by mouth 2 (two) times daily with a meal. (Patient taking differently: Take 4 mg by mouth 2 (two) times daily as needed (nausea from chemotherapy). ) 10 tablet 1  . eplerenone (INSPRA) 25 MG tablet Take 25 mg by mouth daily.    . furosemide (LASIX) 40 MG tablet Take 1 tablet (40 mg total) by mouth daily. 30 tablet 1  . losartan (COZAAR) 25 MG tablet Take 1 tablet (25 mg total) by mouth daily. 30 tablet 0  . oxyCODONE (OXY IR/ROXICODONE) 5 MG immediate release tablet Take 1 tablet (5 mg total) by mouth every 6 (six) hours as needed for moderate pain. 60 tablet 0  . potassium chloride SA (K-DUR,KLOR-CON) 20 MEQ tablet Take 1 tablet (20 mEq total) by mouth daily. 30 tablet 0  . prochlorperazine (COMPAZINE) 10 MG tablet Take 1 tablet (10 mg total) by mouth every 6 (six) hours as needed for nausea or vomiting. 30 tablet 1   No current facility-administered medications for this visit.     REVIEW OF SYSTEMS: Constitutional: Denies fevers, chills or abnormal night sweats, (+) fatigue Eyes: Denies blurriness of vision, double vision or watery eyes Ears, nose, mouth, throat, and face: Denies mucositis or sore throat Respiratory: Denies cough, dyspnea or wheezes, (+) mild congestion and dyspnea Cardiovascular: Denies palpitation, chest discomfort or lower extremity swelling Gastrointestinal:  (+) nausea, (+) Epigastric pain from the subcutaneous nodule. Skin: Denies abnormal skin rashes Lymphatics: Denies new lymphadenopathy or easy bruising Neurological:Denies numbness, tingling or new weaknesses, (+) intermittent right flank pain Behavioral/Psych: Mood is stable, no new changes  All other systems were reviewed with the patient and are negative.  PHYSICAL EXAMINATION: ECOG PERFORMANCE STATUS: 2 BP 111/83 (BP Location: Left Arm, Patient Position: Sitting)   Pulse 94   Temp 97.6 F (36.4 C) (Oral)   Resp 18   Ht 5' 6"  (1.676 m)   Wt 171 lb 8 oz (77.8 kg)   SpO2 99%   BMI 27.68 kg/m     GENERAL:alert, no distress and comfortable SKIN: skin color, texture, turgor are normal, no rashes or significant lesions EYES: normal, conjunctiva are pink and non-injected, sclera clear OROPHARYNX:no exudate, no erythema and lips, buccal mucosa, and tongue normal  NECK: supple, thyroid normal size, non-tender, without nodularity LYMPH:  no palpable lymphadenopathy in the cervical, axillary or inguinal LUNGS: clear to auscultation and percussion with normal breathing effort. HEART: regular rate & rhythm and no murmurs and no lower  extremity edema ABDOMEN:abdomen soft, non-tender and normal bowel sounds.  There is a 2.5cm subcutaneous nodule in the epigastric area, tender, no skin erythema. Biopsy site noted. Musculoskeletal:no cyanosis of digits and no clubbing  PSYCH: alert & oriented x 3 with fluent speech NEURO: no focal motor/sensory deficitsall  LABORATORY DATA:  I have reviewed the data as listed CBC Latest Ref Rng & Units 07/22/2016 07/21/2016 07/08/2016  WBC 3.9 - 10.3 10e3/uL 6.6 7.0 5.4  Hemoglobin 11.6 - 15.9 g/dL 13.5 14.1 13.0  Hematocrit 34.8 - 46.6 % 44.8 47.2(H) 43.9  Platelets 145 - 400 10e3/uL 380 383 487(H)    CMP Latest Ref Rng & Units 07/22/2016 07/08/2016 06/19/2016  Glucose 70 - 140 mg/dl 108 127 139(H)  BUN 7.0 - 26.0 mg/dL 26.9(H) 17.4 14  Creatinine 0.6 - 1.1 mg/dL 1.2(H) 1.1 1.40(H)  Sodium 136 - 145 mEq/L 142 145 142  Potassium 3.5 - 5.1 mEq/L 4.5 3.7 3.5  Chloride 101 - 111 mmol/L - - 106  CO2 22 - 29 mEq/L 25 24 25   Calcium 8.4 - 10.4 mg/dL 9.3 9.6 9.2  Total Protein 6.4 - 8.3 g/dL 6.7 7.0 -  Total Bilirubin 0.20 - 1.20 mg/dL 1.49(H) 1.33(H) -  Alkaline Phos 40 - 150 U/L 65 66 -  AST 5 - 34 U/L 20 20 -  ALT 0 - 55 U/L 16 22 -    Results for ILSA, BONELLO (MRN [id number]) as of 06/10/2016 16:27  Ref. Range 03/18/2016 09:19 06/10/2016 10:49  Iron Latest Ref Range: 41 - 142 ug/dL 45 41  UIBC Latest Ref Range: 120 - 384 ug/dL 324 272  TIBC  Latest Ref Range: 236 - 444 ug/dL 369 314  %SAT Latest Ref Range: 21 - 57 % 12 (L) 13 (L)  Ferritin Latest Ref Range: 9 - 269 ng/ml 223 72    CEA  06/19/2015: 457.8 08/14/2015: 442.6 12/04/2015: 293.1 03/04/2016: 132.1 04/15/2016: 140.7 05/27/2016: 122.56 07/08/2016: 109.7 07/22/16: Pending  Diagnosis 04/08/2015 1. Liver, needle/core biopsy, ? cancer - METASTATIC ADENOCARCINOMA. 2. Colon, segmental resection for tumor, sigmoid colon mass open end proximal - COLONIC ADENOCARCINOMA WITH MUCINOUS FEATURES EXTENDING INTO PERICOLONIC ADIPOSE TISSUE AND SUBSEROSAL CONNECTIVE TISSUE. - MARGINS NOT INVOLVED. - FIFTEEN BENIGN LYMPH NODES (0/15). 3. Colon, resection margin (donut), distal anastomic ring - BENIGN COLON. - NO EVIDENCE OF MALIGNANCY.  Microscopic Comment Specimen: Sigmoid colon with liver biopsy and anastomotic rings. Procedure: Segmental resection with liver biopsy. Tumor site: Distal sigmoid. Specimen integrity: Intact. Macroscopic intactness of mesorectum: N/A Macroscopic tumor perforation: No. Invasive tumor: Maximum size: 8 cm Histologic type(s): Colorectal adenocarcinoma with mucinous features. Histologic grade and differentiation: G2: moderately differentiated/low grade Type of polyp in which invasive carcinoma arose: No residual polyp. Microscopic extension of invasive tumor: Into pericolonic adipose tissue and subserosal connective tissue. Lymph-Vascular invasion: Present. Peri-neural invasion: Present. Tumor deposit(s) (discontinuous extramural extension): No. Resection margins: Proximal margin: Free of tumor. Distal margin: Free of tumor. Circumferential (radial) (posterior ascending, posterior descending; lateral and posterior mid-rectum; and entire lower 1/3 rectum): N/A Mesenteric margin (sigmoid and transverse): Free of tumor. Distance closest margin (if all above margins negative): 2 cm from mesenteric margin. Treatment effect (neo-adjuvant therapy):  No. Additional polyp(s): No. Non-neoplastic findings: None. Lymph nodes: number examined - 15; number positive: 0 Pathologic Staging: pT3, pN0, pM1 Ancillary studies: MMR pending. (JDP:gt, 04/10/15) 2. Mismatch Repair (MMR) Protein Immunohistochemistry (IHC) IHC Expression Result: MLH1: Preserved nuclear expression (greater 50% tumor expression) MSH2: Preserved nuclear expression (greater 50% tumor expression) MSH6:  Preserved nuclear expression (greater 50% tumor expression) PMS2: Preserved nuclear expression (greater 50% tumor expression) * Internal control demonstrates intact nuclear expression Interpretation: NORMAL   FoundationOne test result    Surgical Pathology 07/21/2016 US Biopsy 07/21/16 Diagnosis Soft Tissue Needle Core Biopsy, upper anterior abdomen - ADENOCARCINOMA, CONSISTENT WITH COLONIC PRIMARY.  RADIOGRAPHIC STUDIES: I have personally reviewed the radiological images as listed and agreed with the findings in the report.  Ct chest, abdomen and pelvis W Contrast 06/11/2016 IMPRESSION: New small pleural effusions and bibasilar atelectasis, left side greater than right, of uncertain etiology. No thoracic masses or lymphadenopathy identified.  Stable left hepatic lobe metastasis. No new or progressive metastatic disease identified within the abdomen or pelvis.  Stable nodular carpal linear soft tissue density in midline ventral abdominal wall subcutaneous fat, which favors postsurgical scarring with tumor seeding of surgical tract considered less likely. Recommend continued attention on follow-up CT.  Stable uterine fibroids, cholelithiasis, and tiny hiatal hernia.  Bone scan 03/31/2016 IMPRESSION: Negative bone scan.  ASSESSMENT & PLAN:  55 y.o. African-American female, with past medical history of hypertension, thalassemia trait, iron deficient anemia, uterine fibroids, endometriosis, who presents with intermittent rectal bleeding, mild nausea, and mild  intermittent left lower quadrant abdominal pain.   1. Sigmoid rectal adenocarcinoma, with liver metastases, pT3N0M1, stage IV, MMR normal, NRAS and BRAF mutation (+)  -I previously reviewed her surgical pathology findings with her in great details. Her liver biopsy confirmed metastatic adenocarcinoma  From colon cancer. Her primary sigmoid rectal tumor has been completely removed. -We previously reviewed her CT findings, it showed at least 4 liver metastatic lesion, with the largest one measuring 9.1 cm in the left lobe. No other distant metastasis -We previously reviewed the natural history of metastatic colon cancer, and incurable nature at this is stage, and palliative goal of therapy-Her tumor has BRAF mutation, which is a poor prognostic factor  -Her tumor has NRAS mutation, not a candidate for EGFR inhibitor  -Given the MSI-stable, she might not benefit from immunotherapy, except in a clinically trial setting  -I previously reviewed her restaging CT scan from 01/14/2016, which showed slight improvement in liver mets, no other new lesions, she has had a good partial response so far.  - her tumor marker CEA has been trending down lately, corresponding to good response to chemotherapy -I previously discussed her bone scan from 03/31/16, which showed no evidence of bone metastasis. -Her muscle spasm and pain has resolved after she stopped omeprazole. -Her recent restaging CT scan from 06/11/2016 showed stable liver metastasis, but no other new cancer progression. -Due to her significant CHF, inability to tolerate IV fluids, her IV chemotherapy was held. The patient still has symptomatic CHF. -I discussed with the patient that her US biopsy of the upper anterior abdomen nodule was positive for metastatic adenocarcinoma. Her pain has improved after the biopsy, will hold on palliative radiation to the abdominal subcutaneous metastasis for now. -I will switch the patient's chemotherapy to Xeloda for now,  starting next week. -Plan to restage in 5-6 weeks, she may need change her chemo if restaging scan shows disease progression  -Due to her CHF, her chemo options are limited. We also discussed the role of immunotherapy with MEK inhibitor which is currently being tested in clinical trial.   2. CHF, with EF 20-25% -She has developed a symptomatic CHF in earlier November  2017, etiology is unclear, possible cuddy myopathy from hypertension, versus infection induced cardiomyopathy, or chemotherapy related cardiomyopathy -She is on  Lasix, managed by her cardiologist, still quite symptomatic.  3. Epigastric abdomen pain from the subcutaneous lesion -She has developed a worsening pain from the epigastric subcutaneous lesion, which has been growing lately, likely a metastatic lesion. -I previously recommended her to consider ultrasound-guided needle biopsy to confirm metastasis, and we will consider surgical resection or radiation for palliation, I discussed with Dr. Johney Maine. -07/21/16 - Soft Tissue Needle Core Biopsy, upper anterior abdomen - ADENOCARCINOMA, CONSISTENT WITH COLONIC PRIMARY. -I would consider palliative radiation if her pain gets worse again. The pain seemed improved since her biopsy yesterday.   4 Iron deficient Anemia, and beta thalassemia trait  -Secondary to GI bleeding and iron deficiency -Ferritin level was 8, consistent with iron deficiency from GI bleeding -She received Feraheme 510 mg twice,  responded very well. Her anemia resolved, even she was getting chemotherapy. -her repeated iron study have showed normal iron level and ferritin.  5. Hypertension -She has no history of hypertension. Blood pressure has been slightly elevated in the past months, repeated normal. Possibly related to Avastin -I'll continue monitoring her blood pressure closely.  -Follow up with her cardiologist  6. Peripheral neuropathy, G1 -Secondary to chemotherapy, which has been mild and stable, usually  resolves before her next cycle chemo  -we'll watch closely. If gets worse, we'll consider dose reduction. -I previously recommend her to try Neurontin, she declined due to her sensitivity to sedative medication  7. Goal of care -We discussed her goal of care is palliative, to prolong her life and improve her quality of life -she agrees, but not ready to switch to palliative care alone    Plan -We'll switch her chemotherapy to Xeloda and Avastin due to her symptomatic CHF and inability to tolerate IV fluids. -I will prescribe Xeloda. To start taking it next week. -Labs, f/u, Avastin in 3 weeks.   All questions were answered. The patient knows to call the clinic with any problems, questions or concerns. I spent 30 minutes counseling the patient face to face. The total time spent in the appointment was 40 minutes and more than 50% was on counseling.     Truitt Merle, MD 07/22/2016    This document serves as a record of services personally performed by Truitt Merle, MD. It was created on her behalf by Darcus Austin, a trained medical scribe. The creation of this record is based on the scribe's personal observations and the provider's statements to them. This document has been checked and approved by the attending provider.

## 2016-07-22 ENCOUNTER — Ambulatory Visit (HOSPITAL_BASED_OUTPATIENT_CLINIC_OR_DEPARTMENT_OTHER): Payer: BLUE CROSS/BLUE SHIELD | Admitting: Hematology

## 2016-07-22 ENCOUNTER — Other Ambulatory Visit (HOSPITAL_BASED_OUTPATIENT_CLINIC_OR_DEPARTMENT_OTHER): Payer: BLUE CROSS/BLUE SHIELD

## 2016-07-22 ENCOUNTER — Ambulatory Visit (HOSPITAL_COMMUNITY)
Admission: RE | Admit: 2016-07-22 | Discharge: 2016-07-22 | Disposition: A | Discharge status: Home / Self Care | Payer: BLUE CROSS/BLUE SHIELD | Source: Ambulatory Visit | Attending: Hematology | Admitting: Hematology

## 2016-07-22 ENCOUNTER — Ambulatory Visit (HOSPITAL_BASED_OUTPATIENT_CLINIC_OR_DEPARTMENT_OTHER): Payer: BLUE CROSS/BLUE SHIELD

## 2016-07-22 ENCOUNTER — Encounter: Payer: Self-pay | Admitting: Hematology

## 2016-07-22 ENCOUNTER — Telehealth: Payer: Self-pay | Admitting: Hematology

## 2016-07-22 VITALS — BP 111/83 | HR 94 | Temp 97.6°F | Resp 18 | Ht 66.0 in | Wt 171.5 lb

## 2016-07-22 DIAGNOSIS — J984 Other disorders of lung: Secondary | ICD-10-CM | POA: Insufficient documentation

## 2016-07-22 DIAGNOSIS — C187 Malignant neoplasm of sigmoid colon: Secondary | ICD-10-CM

## 2016-07-22 DIAGNOSIS — C787 Secondary malignant neoplasm of liver and intrahepatic bile duct: Secondary | ICD-10-CM | POA: Diagnosis not present

## 2016-07-22 DIAGNOSIS — C189 Malignant neoplasm of colon, unspecified: Secondary | ICD-10-CM

## 2016-07-22 DIAGNOSIS — Z452 Encounter for adjustment and management of vascular access device: Secondary | ICD-10-CM

## 2016-07-22 DIAGNOSIS — D561 Beta thalassemia: Secondary | ICD-10-CM

## 2016-07-22 DIAGNOSIS — I420 Dilated cardiomyopathy: Secondary | ICD-10-CM | POA: Diagnosis not present

## 2016-07-22 DIAGNOSIS — G620 Drug-induced polyneuropathy: Secondary | ICD-10-CM

## 2016-07-22 DIAGNOSIS — Z95828 Presence of other vascular implants and grafts: Secondary | ICD-10-CM

## 2016-07-22 DIAGNOSIS — D500 Iron deficiency anemia secondary to blood loss (chronic): Secondary | ICD-10-CM

## 2016-07-22 DIAGNOSIS — I10 Essential (primary) hypertension: Secondary | ICD-10-CM | POA: Diagnosis not present

## 2016-07-22 DIAGNOSIS — Z7189 Other specified counseling: Secondary | ICD-10-CM | POA: Insufficient documentation

## 2016-07-22 DIAGNOSIS — I517 Cardiomegaly: Secondary | ICD-10-CM | POA: Diagnosis not present

## 2016-07-22 DIAGNOSIS — D509 Iron deficiency anemia, unspecified: Secondary | ICD-10-CM

## 2016-07-22 LAB — CBC WITH DIFFERENTIAL/PLATELET
BASO%: 0.3 % (ref 0.0–2.0)
BASOS ABS: 0 10*3/uL (ref 0.0–0.1)
EOS%: 3.8 % (ref 0.0–7.0)
Eosinophils Absolute: 0.3 10*3/uL (ref 0.0–0.5)
HEMATOCRIT: 44.8 % (ref 34.8–46.6)
HGB: 13.5 g/dL (ref 11.6–15.9)
LYMPH%: 29.7 % (ref 14.0–49.7)
MCH: 22.4 pg — ABNORMAL LOW (ref 25.1–34.0)
MCHC: 30.1 g/dL — AB (ref 31.5–36.0)
MCV: 74.2 fL — ABNORMAL LOW (ref 79.5–101.0)
MONO#: 1.1 10*3/uL — ABNORMAL HIGH (ref 0.1–0.9)
MONO%: 16.6 % — AB (ref 0.0–14.0)
NEUT#: 3.3 10*3/uL (ref 1.5–6.5)
NEUT%: 49.6 % (ref 38.4–76.8)
Platelets: 380 10*3/uL (ref 145–400)
RBC: 6.04 10*6/uL — ABNORMAL HIGH (ref 3.70–5.45)
RDW: 20.6 % — ABNORMAL HIGH (ref 11.2–14.5)
WBC: 6.6 10*3/uL (ref 3.9–10.3)
lymph#: 2 10*3/uL (ref 0.9–3.3)

## 2016-07-22 LAB — COMPREHENSIVE METABOLIC PANEL
ALT: 16 U/L (ref 0–55)
AST: 20 U/L (ref 5–34)
Albumin: 3.3 g/dL — ABNORMAL LOW (ref 3.5–5.0)
Alkaline Phosphatase: 65 U/L (ref 40–150)
Anion Gap: 11 mEq/L (ref 3–11)
BUN: 26.9 mg/dL — AB (ref 7.0–26.0)
CALCIUM: 9.3 mg/dL (ref 8.4–10.4)
CHLORIDE: 106 meq/L (ref 98–109)
CO2: 25 mEq/L (ref 22–29)
Creatinine: 1.2 mg/dL — ABNORMAL HIGH (ref 0.6–1.1)
EGFR: 60 mL/min/{1.73_m2} — ABNORMAL LOW (ref >90)
Glucose: 108 mg/dl (ref 70–140)
POTASSIUM: 4.5 meq/L (ref 3.5–5.1)
SODIUM: 142 meq/L (ref 136–145)
Total Bilirubin: 1.49 mg/dL — ABNORMAL HIGH (ref 0.20–1.20)
Total Protein: 6.7 g/dL (ref 6.4–8.3)

## 2016-07-22 LAB — CEA (IN HOUSE-CHCC): CEA (CHCC-IN HOUSE): 129.69 ng/mL — AB (ref 0.00–5.00)

## 2016-07-22 MED ORDER — SODIUM CHLORIDE 0.9 % IJ SOLN
10.0000 mL | INTRAMUSCULAR | Status: DC | PRN
  Administered 2016-07-22: 10 mL via INTRAVENOUS
  Filled 2016-07-22: qty 10

## 2016-07-22 MED ORDER — CAPECITABINE 500 MG PO TABS: 1000.0000 mg/m2 | ORAL_TABLET | Freq: Two times a day (BID) | ORAL | 1 refills | Status: DC

## 2016-07-22 MED ORDER — OXYCODONE HCL 5 MG PO TABS: 5.0000 mg | ORAL_TABLET | Freq: Four times a day (QID) | ORAL | 0 refills | Status: DC | PRN

## 2016-07-22 MED ORDER — HEPARIN SOD (PORK) LOCK FLUSH 100 UNIT/ML IV SOLN
500.0000 [IU] | Freq: Once | INTRAVENOUS | Status: AC | PRN
  Administered 2016-07-22: 500 [IU] via INTRAVENOUS
  Filled 2016-07-22: qty 5

## 2016-07-22 NOTE — Telephone Encounter (Signed)
Appointments scheduled per 12/14 LOS. Patient given AVS report and calendars with future scheduled appointments. Patient aware of CXR today.

## 2016-07-26 ENCOUNTER — Other Ambulatory Visit: Payer: Self-pay | Admitting: Pharmacist

## 2016-07-26 ENCOUNTER — Telehealth: Payer: Self-pay | Admitting: Pharmacist

## 2016-07-26 DIAGNOSIS — C189 Malignant neoplasm of colon, unspecified: Secondary | ICD-10-CM

## 2016-07-26 DIAGNOSIS — C787 Secondary malignant neoplasm of liver and intrahepatic bile duct: Principal | ICD-10-CM

## 2016-07-26 MED ORDER — CAPECITABINE 500 MG PO TABS: 1000.0000 mg/m2 | ORAL_TABLET | Freq: Two times a day (BID) | ORAL | 1 refills | Status: DC

## 2016-07-26 NOTE — Telephone Encounter (Signed)
Oral Chemotherapy Pharmacist Encounter  Received new prescription for Xeloda to be taken in conjunction with Avastin for colon cancer. Labs from 07/22/16 reviewed, OK for treatment, noted slightly elevated bilirubin  Current medication list in Epic assessed, some DDIs with Xeloda identified  Xeloda and Cozaar, Xeloda is an inhibitor of CYP2C9 and may increase exposure to Cozaar. BPs on high end of normal, no modification to therapy indicated at this time, BP will continue to be monitored.  Xeloda and Coreg,  Xeloda is an inhibitor of CYP2C9 and may increase exposure to Coreg. BPs on high end of normal, no modification to therapy indicated at this time, BP will continue to be monitored.  Noted patient's insurance carrier is [employer] and prescription benefits are processed through [employer]. As such, prescription will be faxed to [facility] at [phone]. Phone number to [facility] for follow up is [phone]  Oral Oncology Clinic will continue to follow for prior authorization, copayment issues, start date, initial counseling, and toxicity and adherence management.  Johny Drilling, PharmD, BCPS, BCOP 07/26/2016  3:08 PM Oral Oncology Clinic [phone]

## 2016-07-26 NOTE — Progress Notes (Signed)
Changed pharmacy for Xeloda prescription to [place]

## 2016-07-29 ENCOUNTER — Telehealth: Payer: Self-pay | Admitting: *Deleted

## 2016-07-29 NOTE — Telephone Encounter (Signed)
Patient called wanting to know if she should receive labs before her office visit on 12/26 with MD Mohamed.   

## 2016-07-29 NOTE — Telephone Encounter (Signed)
Oral Chemotherapy Pharmacist Encounter  I called [facility] to follow up on status of patient's Xeloda prescription. I was informed that patient has an outstanding balance and needs to contact them to discuss payment and shipping. They would not share copayment information with me.  I called patient to alert her to call pharmacy at [phone], option 1 for copayment information and to schedule Xeloda delivery. Patient expressed understanding and stated she would call pharmacy.  Patient instructed to reach out to office with any issues acquiring her medication.  Oral Oncology Clinic will continue to follow for start date, initial counseling, toxicity and adherence management.  Johny Drilling, PharmD, BCPS, BCOP 07/29/2016  3:25 PM Oral Oncology Clinic [phone]

## 2016-07-29 NOTE — Telephone Encounter (Signed)
Disregard message below. Charted in error on wrong patient

## 2016-07-30 ENCOUNTER — Telehealth: Payer: Self-pay | Admitting: *Deleted

## 2016-07-30 NOTE — Telephone Encounter (Signed)
Oral Chemotherapy Pharmacist Encounter  I attempted to call [employer] for further clarification of grace period mentioned in earlier phone note from [facility]. [employer] is closed for the holiday. I was not able to reach anyone for further clarification. Myrtle hardin, RN notified. She will call patient back to let her know we have no further information at this time.  Johny Drilling, PharmD, BCPS, BCOP 07/30/2016  4:25 PM Oral Oncology Clinic [phone]

## 2016-07-30 NOTE — Telephone Encounter (Addendum)
Received vm call yest stating insurance has denied xeloda.  Returned call today & pt states that she called [facility] & was told insurance denied b/c of being in a grace period.  Pt did not understand this.  Called [facility] & informed that pt's premium has not been paid & this would have to be taken care of before insurance will approve.  Informed pt to contact her insurance company to get resolved. Pt expressed understanding.  Received message that pt called insurance co & was told that denial had nothing to do with premium but that she was in a grace period & couldn't get filled until January 2018.  Johny Drilling called [employer] & they are closed for the holiday.  Will try to reach them next tues.

## 2016-08-03 ENCOUNTER — Telehealth: Payer: Self-pay | Admitting: *Deleted

## 2016-08-03 NOTE — Telephone Encounter (Signed)
Patient calling to say she has spoken with the insurance company and everything is cleared up and they should be shipping out the medication, nurse thu notified.

## 2016-08-03 NOTE — Telephone Encounter (Signed)
Melissa, pharmacist spoke with [place] today.  Was informed by Lenna Sciara that per pharmacy, it is the insurance company who denied Xeloda due to a balance on pt's account.   Pt needs to settle the balance before insurance will authorize Xeloda script.  Pt is in the grace period of 30 days before termination of insurance will occur. Called pt and left message asking pt to call her insurance to settle the issue so Xeloda can be processed by specialty pharmacy.

## 2016-08-06 ENCOUNTER — Telehealth: Payer: Self-pay | Admitting: *Deleted

## 2016-08-06 NOTE — Telephone Encounter (Signed)
Received call from pt stating that Thu called her yest regarding calling insurance company & she did call [employer] & was told everything was good & she also talked with [facility] & was told that med would be send out [employer] today & she is still waiting.  She is also concerned about appt dates stating appt has been changed to the 4th & she should get avastin & she is on call 5,6 & 7 & she is has never had just the avastin so she doesn't know how she will react.  Per Dr Ernestina Penna office note, pt should be seen next week with treatment.  She will try to get coverage for her call dates in case she needs it. Called [facility] & they report insurance issue not resolved.  They are checking with [employer]. Received call back from [facility] @ 4:49 pm stating that [employer] states pt hasn't made premium payment & script was therefore voided.  Call back # is [phone] Notified pt to call [facility] to try to get straightened out. Suggested she have her husband call them.

## 2016-08-10 NOTE — Telephone Encounter (Signed)
Oral Chemotherapy Pharmacist Encounter  I called [facility] 08/10/16 to follow-up on patient's Xeloda prescription. I was informed patient had called already and has authorized the pharmacy to charge her credit card for copayment. They said prescription had not yet been scheduled for delivery, but should be scheduled sometime that day.  I called to speak to patient since there has been lots of conflicting information about her Xeloda prescription. Patient confirms she did give the pharmacy her billing info for copay and that they had scheduled her Xeloda delivery already. Patient was told her Xeloda should be delivered tomorrow (08/11/16).  Since the information given to patient and myself is not exactly the same, [facility] will call [facility] 08/11/16 am to follow up on shipping status.  Johny Drilling, PharmD, BCPS, BCOP 08/10/2016  3:19 PM [facility] [phone]

## 2016-08-11 NOTE — Telephone Encounter (Signed)
Oral Chemotherapy Pharmacist Encounter  I called [facility] to follow up on patient's Xeloda prescription. Xeloda has not yet been scheduled for delivery. They would not disclose the issue with the prescription being scheduled, and instructed me to have patient call them.  I called patient to alert her that medication was not yet out for delivery and [facility] had already called her this morning to handle the undisclosed issue. She was told that her Xeloda was not yet scheduled, but she was going to assume that it would be today.  Patient appointments for 08/12/16 confirmed, patient will reach out to me when she is here if she is still having issues obtaining her Xeloda.  [facility] will continue to follow.  Johny Drilling, PharmD, BCPS, BCOP 08/11/2016  9:51 AM [facility] [phone]

## 2016-08-12 ENCOUNTER — Telehealth: Payer: Self-pay | Admitting: *Deleted

## 2016-08-12 ENCOUNTER — Ambulatory Visit (HOSPITAL_BASED_OUTPATIENT_CLINIC_OR_DEPARTMENT_OTHER): Payer: BLUE CROSS/BLUE SHIELD | Admitting: Hematology

## 2016-08-12 ENCOUNTER — Ambulatory Visit: Payer: BLUE CROSS/BLUE SHIELD

## 2016-08-12 ENCOUNTER — Encounter: Payer: Self-pay | Admitting: Hematology

## 2016-08-12 ENCOUNTER — Other Ambulatory Visit (HOSPITAL_BASED_OUTPATIENT_CLINIC_OR_DEPARTMENT_OTHER): Payer: BLUE CROSS/BLUE SHIELD

## 2016-08-12 ENCOUNTER — Telehealth: Payer: Self-pay

## 2016-08-12 VITALS — BP 133/99 | HR 89 | Temp 98.4°F | Resp 16 | Wt 173.1 lb

## 2016-08-12 DIAGNOSIS — I10 Essential (primary) hypertension: Secondary | ICD-10-CM | POA: Diagnosis not present

## 2016-08-12 DIAGNOSIS — D500 Iron deficiency anemia secondary to blood loss (chronic): Secondary | ICD-10-CM

## 2016-08-12 DIAGNOSIS — C187 Malignant neoplasm of sigmoid colon: Secondary | ICD-10-CM

## 2016-08-12 DIAGNOSIS — C189 Malignant neoplasm of colon, unspecified: Secondary | ICD-10-CM

## 2016-08-12 DIAGNOSIS — C787 Secondary malignant neoplasm of liver and intrahepatic bile duct: Secondary | ICD-10-CM

## 2016-08-12 DIAGNOSIS — G620 Drug-induced polyneuropathy: Secondary | ICD-10-CM

## 2016-08-12 DIAGNOSIS — D509 Iron deficiency anemia, unspecified: Secondary | ICD-10-CM

## 2016-08-12 DIAGNOSIS — I420 Dilated cardiomyopathy: Secondary | ICD-10-CM

## 2016-08-12 DIAGNOSIS — D561 Beta thalassemia: Secondary | ICD-10-CM

## 2016-08-12 LAB — COMPREHENSIVE METABOLIC PANEL
ALT: 16 U/L (ref 0–55)
AST: 23 U/L (ref 5–34)
Albumin: 3.3 g/dL — ABNORMAL LOW (ref 3.5–5.0)
Alkaline Phosphatase: 74 U/L (ref 40–150)
Anion Gap: 10 mEq/L (ref 3–11)
BILIRUBIN TOTAL: 1.39 mg/dL — AB (ref 0.20–1.20)
BUN: 24.2 mg/dL (ref 7.0–26.0)
CO2: 25 meq/L (ref 22–29)
Calcium: 9.6 mg/dL (ref 8.4–10.4)
Chloride: 107 mEq/L (ref 98–109)
Creatinine: 1.1 mg/dL (ref 0.6–1.1)
EGFR: 63 mL/min/{1.73_m2} — AB (ref >90)
GLUCOSE: 134 mg/dL (ref 70–140)
POTASSIUM: 3.9 meq/L (ref 3.5–5.1)
SODIUM: 143 meq/L (ref 136–145)
Total Protein: 6.6 g/dL (ref 6.4–8.3)

## 2016-08-12 LAB — CBC WITH DIFFERENTIAL/PLATELET
BASO%: 0.3 % (ref 0.0–2.0)
Basophils Absolute: 0 10*3/uL (ref 0.0–0.1)
EOS ABS: 0.5 10*3/uL (ref 0.0–0.5)
EOS%: 8 % — AB (ref 0.0–7.0)
HCT: 43.6 % (ref 34.8–46.6)
HGB: 13.5 g/dL (ref 11.6–15.9)
LYMPH%: 29.1 % (ref 14.0–49.7)
MCH: 21.7 pg — ABNORMAL LOW (ref 25.1–34.0)
MCHC: 31 g/dL — ABNORMAL LOW (ref 31.5–36.0)
MCV: 70.1 fL — AB (ref 79.5–101.0)
MONO#: 0.7 10*3/uL (ref 0.1–0.9)
MONO%: 11.5 % (ref 0.0–14.0)
NEUT#: 3 10*3/uL (ref 1.5–6.5)
NEUT%: 51.1 % (ref 38.4–76.8)
Platelets: 411 10*3/uL — ABNORMAL HIGH (ref 145–400)
RBC: 6.22 10*6/uL — AB (ref 3.70–5.45)
RDW: 20 % — ABNORMAL HIGH (ref 11.2–14.5)
WBC: 5.9 10*3/uL (ref 3.9–10.3)
lymph#: 1.7 10*3/uL (ref 0.9–3.3)

## 2016-08-12 MED ORDER — ONDANSETRON HCL 4 MG PO TABS: 4.0000 mg | ORAL_TABLET | Freq: Three times a day (TID) | ORAL | 2 refills | Status: DC | PRN

## 2016-08-12 MED ORDER — MIRTAZAPINE 15 MG PO TABS: 15.0000 mg | ORAL_TABLET | Freq: Every day | ORAL | 1 refills | Status: DC

## 2016-08-12 MED ORDER — OXYCODONE HCL 5 MG PO TABS: 5.0000 mg | ORAL_TABLET | Freq: Four times a day (QID) | ORAL | 0 refills | Status: DC | PRN

## 2016-08-12 NOTE — Progress Notes (Signed)
[place]  [phone] [phone]  Clinic follow up Note   Patient Care Team: Benito Mccreedy, MD as PCP - General (Internal Medicine) Juanita Craver, MD as Consulting Physician (Gastroenterology) Michael Boston, MD as Consulting Physician (General Surgery) Truitt Merle, MD as Consulting Physician (Oncology) 08/12/2016   CHIEF COMPLAINTS:  Follow up metastatic colon cancer      Oncology History   Metastatic colon cancer to liver   Staging form: Colon and Rectum, AJCC 7th Edition     Pathologic stage from 04/08/2015: T3, N0, M1 - Signed by Truitt Merle, MD on 04/22/2015  Presented to ER with intractable N/V; intermittent rectal bleeding X 3 months      Metastatic colon cancer to liver ([place])   04/03/2015 Procedure    Colonoscopy showed a obstructing sigmoid rectal mass. Biopsy showed adenocarcinoma.      04/06/2015 Tumor Marker    CEA=467.3 / CA19.9=1605      04/06/2015 Imaging    CT chest, abdomen and pelvis with contrast showed sigmoid colon rectal mass, multiple (4) liver metastasis, with the largest 9.1 x 6.1 cm mass in the left lobe..      04/08/2015 Miscellaneous    Foundation one genomic testing showed NRAS G60e and BRAF [id number] mutations      04/08/2015 Initial Diagnosis    Metastatic colon cancer to liver      04/08/2015 Surgery    Laparoscopic sigmoid colectomy, liver biopsy, port cath insertion, by Dr. Johney Maine       04/08/2015 Pathology Results    Sigmoid colon segmental resection showed adenocarcinoma with mucinous features, pT 3, 15 lymph nodes all negative, surgical margins were negative. Liver biopsy showed metastatic adenocarcinoma.      05/08/2015 - 06/10/2016 Chemotherapy    FOLFIRI every 2 weeks, Avastin was added on from second cycle. Chemo was held when she developed severe CHF       07/14/2015 Imaging    CT abdomen and pelvis showed interval slight improvement in liver metastasis. No other new lesions.      10/12/2015 Imaging       CT abdomen and pelvis with contrast showed continued interval decrease in size of multiple hepatic metastases,  no other new lesions.      06/11/2016 Imaging    Ct chest, abdomen and pelvis W Contrast IMPRESSION: New small pleural effusions and bibasilar atelectasis, left side greater than right, of uncertain etiology. No thoracic masses or lymphadenopathy identified.  Stable left hepatic lobe metastasis. No new or progressive metastatic disease identified within the abdomen or pelvis.  Stable nodular carpal linear soft tissue density in midline ventral abdominal wall subcutaneous fat, which favors postsurgical scarring with tumor seeding of surgical tract considered less likely. Recommend continued attention on follow-up CT.  Stable uterine fibroids, cholelithiasis, and tiny hiatal hernia.       06/16/2016 - 06/20/2016 Hospital Admission    Pt was admitted for pneumonia and acute on chronic CHF      07/21/2016 Pathology Results    US Biopsy Diagnosis Soft Tissue Needle Core Biopsy, upper anterior abdomen - ADENOCARCINOMA, CONSISTENT WITH COLONIC PRIMARY.      08/12/2016 -  Chemotherapy    Xeloda 1g bid, 2 weeks on and 1 week off, and Avastin every 3 weeks         HISTORY OF PRESENTING ILLNESS:  Christina [name] 56 y.o. female with past medical history of endometriosis, uterine fibroids, thalassemia trait, iron deficient anemia, who was recently found to have a  sigmoid rectal cancer. I initially saw her in the hospital, she is here for the first follow-up.  She has been having intermittent rectal bleeding for the past 3 months. She thought it was related to her endometriosis, did not seek medical attention. She also has intermittent mild nausea, especially in the morning, and left lower quadrant abdominal pain. She was found to have profound anemia with hemoglobin 6.5 last week, and received blood transfusion. She was referred to gastroenterologist Dr. Collene Mares last week,  underwent colonoscopy 2 days ago, which showed a obstructing sigmoid rectal mass, per patient, the colonoscopy report is not available today. Biopsy was done, but the pathology report is still pending.  She developed severe nausea, and vomited several times with clear gastric liquid. She called [name], and I was told to come to [facility] emergency room by on-call physician Dr. Carlean Purl. She had a CT scan done in the emergency room today.  Her appetite was fairly normal up to 2 days ago before recent hospital admission, when she was found to have a colorectal mass. She lost about 45 pounds in the past few weeks. 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 family history was positive for colon cancer in her maternal cousin. She is married, lives with her husband, no children.  CURRENT THERAPY: Xeloda 2 g twice daily, 2 weeks on, one-week off, and Avastin every 3 weeks, starting on 08/12/2016  INTERIM HISTORY Christina [name] returns for follow-up. She has returned to her work last week, she overall feels better lately. She is able to climb stairs, walk for 2-3K steps a day, mild dyspnea on exertion, overall able to handle first better than before. She still has moderate fatigue. Her main complaint is her thel pain at the abdominal wall metastasis, which is 7-9/10 sometime, she also reports anxiety, and sometime is afraid to fall asleep because she is scared that she may not be able to wake up.  MEDICAL HISTORY:  Past Medical History:  Diagnosis Date  . Endometriosis   . Hypertension   . met colon ca to liver dx'd 03/2015  . Thalassanemia     SURGICAL HISTORY: Past Surgical History:  Procedure Laterality Date  . COLON RESECTION N/A 04/08/2015   Procedure: LAPAROSCOPIC  RESECTION OF PART OF  SIGMOID COLON;  Surgeon: Michael Boston, MD;  Location: [facility];  Service: General;  Laterality: N/A;  . DIAGNOSTIC LAPAROSCOPY     Endometriosis  .  LAPAROSCOPIC SIGMOID COLECTOMY  04/08/2015   for colorectal cancer  . LIVER BIOPSY N/A 04/08/2015   Procedure: CORE NEEDLE LIVER BIOPSY;  Surgeon: Michael Boston, MD;  Location: [facility];  Service: General;  Laterality: N/A;  . MYOMECTOMY     Gyn in [place]  . PORTACATH PLACEMENT N/A 04/08/2015   Procedure: INSERTION PORT-A-CATH;  Surgeon: Michael Boston, MD;  Location: [facility];  Service: General;  Laterality: N/A;    SOCIAL HISTORY: Social History   Social History  . Marital status: Married    Spouse name: N/A  . Number of children: N/A  . Years of education: N/A   Occupational History  . Internal Medicine doctor     Works in [employer]  . Smoking status: Never Smoker  . Smokeless tobacco: Never Used  . Alcohol use Yes     Comment: Once every four months.   . Drug use: No  . Sexual activity: Not on file   Other  Topics Concern  . Not on file   Social History Narrative   Married, husband Christina [name] (married X 2 years)   No children   IM Physician in [place]: Family History  Problem Relation Age of Onset  . Anesthesia problems Cousin 85    maternal cousin, colon cancer   . Clotting disorder Maternal Grandmother     ALLERGIES:  is allergic to lactose intolerance (gi); lasix [furosemide]; lorazepam; and milk-related compounds.  MEDICATIONS:  Current Outpatient Prescriptions  Medication Sig Dispense Refill  . albuterol (VENTOLIN HFA) 108 (90 Base) MCG/ACT inhaler Inhale 2 puffs into the lungs every 6 (six) hours as needed for wheezing or shortness of breath. 1 Inhaler 0  . carvedilol (COREG) 6.25 MG tablet Take 6.25 mg by mouth 2 (two) times daily with a meal.    . eplerenone (INSPRA) 25 MG tablet Take 25 mg by mouth daily.    . furosemide (LASIX) 40 MG tablet Take 1 tablet (40 mg total) by mouth daily. 30 tablet 1  . losartan (COZAAR) 25 MG tablet Take 1 tablet (25 mg total) by mouth daily. 30 tablet 0  .  mirtazapine (REMERON) 15 MG tablet Take 1 tablet (15 mg total) by mouth at bedtime. 30 tablet 1  . ondansetron (ZOFRAN) 4 MG tablet Take 1 tablet (4 mg total) by mouth every 8 (eight) hours as needed for nausea or vomiting. 30 tablet 2  . oxyCODONE (OXY IR/ROXICODONE) 5 MG immediate release tablet Take 1 tablet (5 mg total) by mouth every 6 (six) hours as needed for moderate pain. 90 tablet 0  . potassium chloride SA (K-DUR,KLOR-CON) 20 MEQ tablet Take 1 tablet (20 mEq total) by mouth daily. 30 tablet 0  . prochlorperazine (COMPAZINE) 10 MG tablet Take 1 tablet (10 mg total) by mouth every 6 (six) hours as needed for nausea or vomiting. 30 tablet 1  . capecitabine (XELODA) 500 MG tablet Take 4 tablets (2,000 mg total) by mouth 2 (two) times daily after a meal. Takes on days 1-14 every 21 days (Patient not taking: Reported on 08/12/2016) 112 tablet 1  . dexamethasone (DECADRON) 4 MG tablet Take 1 tablet (4 mg total) by mouth 2 (two) times daily with a meal. (Patient not taking: Reported on 08/12/2016) 10 tablet 1   No current facility-administered medications for this visit.     REVIEW OF SYSTEMS: Constitutional: Denies fevers, chills or abnormal night sweats, (+) fatigue Eyes: Denies blurriness of vision, double vision or watery eyes Ears, nose, mouth, throat, and face: Denies mucositis or sore throat Respiratory: Denies cough, dyspnea or wheezes, (+) mild congestion and dyspnea Cardiovascular: Denies palpitation, chest discomfort or lower extremity swelling Gastrointestinal:  (+) nausea, (+) Epigastric pain from the subcutaneous nodule. Skin: Denies abnormal skin rashes Lymphatics: Denies new lymphadenopathy or easy bruising Neurological:Denies numbness, tingling or new weaknesses, (+) intermittent right flank pain Behavioral/Psych: Mood is stable, no new changes  All other systems were reviewed with the patient and are negative.  PHYSICAL EXAMINATION: ECOG PERFORMANCE STATUS: 2 BP (!) 133/99  (BP Location: Right Arm, Patient Position: Sitting)   Pulse 89   Temp 98.4 F (36.9 C) (Oral)   Resp 16   Wt 173 lb 1.6 oz (78.5 kg)   SpO2 100%   BMI 27.94 kg/m   GENERAL:alert, no distress and comfortable SKIN: skin color, texture, turgor are normal, no rashes or significant lesions EYES: normal, conjunctiva are pink and non-injected, sclera clear OROPHARYNX:no exudate, no erythema  and lips, buccal mucosa, and tongue normal  NECK: supple, thyroid normal size, non-tender, without nodularity LYMPH:  no palpable lymphadenopathy in the cervical, axillary or inguinal LUNGS: clear to auscultation and percussion with normal breathing effort. HEART: regular rate & rhythm and no murmurs and no lower extremity edema ABDOMEN:abdomen soft, non-tender and normal bowel sounds.  There is a 2.5cm subcutaneous nodule in the epigastric area, tender, no skin erythema. Biopsy site noted. Musculoskeletal:no cyanosis of digits and no clubbing  PSYCH: alert & oriented x 3 with fluent speech NEURO: no focal motor/sensory deficitsall  LABORATORY DATA:  I have reviewed the data as listed CBC Latest Ref Rng & Units 08/12/2016 07/22/2016 07/21/2016  WBC 3.9 - 10.3 10e3/uL 5.9 6.6 7.0  Hemoglobin 11.6 - 15.9 g/dL 13.5 13.5 14.1  Hematocrit 34.8 - 46.6 % 43.6 44.8 47.2(H)  Platelets 145 - 400 10e3/uL 411(H) 380 383    CMP Latest Ref Rng & Units 08/12/2016 07/22/2016 07/08/2016  Glucose 70 - 140 mg/dl 134 108 127  BUN 7.0 - 26.0 mg/dL 24.2 26.9(H) 17.4  Creatinine 0.6 - 1.1 mg/dL 1.1 1.2(H) 1.1  Sodium 136 - 145 mEq/L 143 142 145  Potassium 3.5 - 5.1 mEq/L 3.9 4.5 3.7  Chloride 101 - 111 mmol/L - - -  CO2 22 - 29 mEq/L 25 25 24   Calcium 8.4 - 10.4 mg/dL 9.6 9.3 9.6  Total Protein 6.4 - 8.3 g/dL 6.6 6.7 7.0  Total Bilirubin 0.20 - 1.20 mg/dL 1.39(H) 1.49(H) 1.33(H)  Alkaline Phos 40 - 150 U/L 74 65 66  AST 5 - 34 U/L 23 20 20   ALT 0 - 55 U/L 16 16 22     Results for EVALENE, VATH (MRN [id number]) as  of 06/10/2016 16:27  Ref. Range 03/18/2016 09:19 06/10/2016 10:49  Iron Latest Ref Range: 41 - 142 ug/dL 45 41  UIBC Latest Ref Range: 120 - 384 ug/dL 324 272  TIBC Latest Ref Range: 236 - 444 ug/dL 369 314  %SAT Latest Ref Range: 21 - 57 % 12 (L) 13 (L)  Ferritin Latest Ref Range: 9 - 269 ng/ml 223 72    CEA  06/19/2015: 457.8 08/14/2015: 442.6 12/04/2015: 293.1 03/04/2016: 132.1 04/15/2016: 140.7 05/27/2016: 122.56 07/08/2016: 109.7 07/22/16: 129  Diagnosis 04/08/2015 1. Liver, needle/core biopsy, ? cancer - METASTATIC ADENOCARCINOMA. 2. Colon, segmental resection for tumor, sigmoid colon mass open end proximal - COLONIC ADENOCARCINOMA WITH MUCINOUS FEATURES EXTENDING INTO PERICOLONIC ADIPOSE TISSUE AND SUBSEROSAL CONNECTIVE TISSUE. - MARGINS NOT INVOLVED. - FIFTEEN BENIGN LYMPH NODES (0/15). 3. Colon, resection margin (donut), distal anastomic ring - BENIGN COLON. - NO EVIDENCE OF MALIGNANCY.  Microscopic Comment Specimen: Sigmoid colon with liver biopsy and anastomotic rings. Procedure: Segmental resection with liver biopsy. Tumor site: Distal sigmoid. Specimen integrity: Intact. Macroscopic intactness of mesorectum: N/A Macroscopic tumor perforation: No. Invasive tumor: Maximum size: 8 cm Histologic type(s): Colorectal adenocarcinoma with mucinous features. Histologic grade and differentiation: G2: moderately differentiated/low grade Type of polyp in which invasive carcinoma arose: No residual polyp. Microscopic extension of invasive tumor: Into pericolonic adipose tissue and subserosal connective tissue. Lymph-Vascular invasion: Present. Peri-neural invasion: Present. Tumor deposit(s) (discontinuous extramural extension): No. Resection margins: Proximal margin: Free of tumor. Distal margin: Free of tumor. Circumferential (radial) (posterior ascending, posterior descending; lateral and posterior mid-rectum; and entire lower 1/3 rectum): N/A Mesenteric margin (sigmoid  and transverse): Free of tumor. Distance closest margin (if all above margins negative): 2 cm from mesenteric margin. Treatment effect (neo-adjuvant therapy): No. Additional polyp(s): No. Non-neoplastic findings:  None. Lymph nodes: number examined - 15; number positive: 0 Pathologic Staging: pT3, pN0, pM1 Ancillary studies: MMR pending. (JDP:gt, 04/10/15) 2. Mismatch Repair (MMR) Protein Immunohistochemistry (IHC) IHC Expression Result: MLH1: Preserved nuclear expression (greater 50% tumor expression) MSH2: Preserved nuclear expression (greater 50% tumor expression) MSH6: Preserved nuclear expression (greater 50% tumor expression) PMS2: Preserved nuclear expression (greater 50% tumor expression) * Internal control demonstrates intact nuclear expression Interpretation: NORMAL   FoundationOne test result    Surgical Pathology 07/21/2016 US Biopsy 07/21/16 Diagnosis Soft Tissue Needle Core Biopsy, upper anterior abdomen - ADENOCARCINOMA, CONSISTENT WITH COLONIC PRIMARY.  RADIOGRAPHIC STUDIES: I have personally reviewed the radiological images as listed and agreed with the findings in the report.  Ct chest, abdomen and pelvis W Contrast 06/11/2016 IMPRESSION: New small pleural effusions and bibasilar atelectasis, left side greater than right, of uncertain etiology. No thoracic masses or lymphadenopathy identified.  Stable left hepatic lobe metastasis. No new or progressive metastatic disease identified within the abdomen or pelvis.  Stable nodular carpal linear soft tissue density in midline ventral abdominal wall subcutaneous fat, which favors postsurgical scarring with tumor seeding of surgical tract considered less likely. Recommend continued attention on follow-up CT.  Stable uterine fibroids, cholelithiasis, and tiny hiatal hernia.  Bone scan 03/31/2016 IMPRESSION: Negative bone scan.  ASSESSMENT & PLAN:  56 y.o. African-American female, with past medical history  of hypertension, thalassemia trait, iron deficient anemia, uterine fibroids, endometriosis, who presents with intermittent rectal bleeding, mild nausea, and mild intermittent left lower quadrant abdominal pain.   1. Sigmoid rectal adenocarcinoma, with liver metastases, pT3N0M1, stage IV, MMR normal, NRAS and BRAF mutation (+)  -I previously reviewed her surgical pathology findings with her in great details. Her liver biopsy confirmed metastatic adenocarcinoma  From colon cancer. Her primary sigmoid rectal tumor has been completely removed. -We previously reviewed her CT findings, it showed at least 4 liver metastatic lesion, with the largest one measuring 9.1 cm in the left lobe. No other distant metastasis -We previously reviewed the natural history of metastatic colon cancer, and incurable nature at this is stage, and palliative goal of therapy-Her tumor has BRAF mutation, which is a poor prognostic factor  -Her tumor has NRAS mutation, not a candidate for EGFR inhibitor  -Given the MSI-stable, she might not benefit from immunotherapy, except in a clinically trial setting  -I previously reviewed her restaging CT scan from 01/14/2016, which showed slight improvement in liver mets, no other new lesions, she has had a good partial response so far.  - her tumor marker CEA has been trending down lately, corresponding to good response to chemotherapy -I previously discussed her bone scan from 03/31/16, which showed no evidence of bone metastasis. -Her muscle spasm and pain has resolved after she stopped omeprazole. -Her recent restaging CT scan from 06/11/2016 showed stable liver metastasis, but no other new cancer progression. -Due to her significant CHF, inability to tolerate IV fluids, her IV chemotherapy was held. The patient still has symptomatic CHF. -I discussed with the patient that her US biopsy of the upper anterior abdomen nodule was positive for metastatic adenocarcinoma. Her pain has improved  after the biopsy, will hold on palliative radiation to the abdominal subcutaneous metastasis for now. -I recommend her to switch the chemotherapy to Xeloda for now, she has issues with her insurance, and Xeloda is finally delivered to her today  -She is on call this weekend, wants to start Avstin next week -will give iv lasix with iv Avastin (in 176m NS)  2. CHF, with EF 20-25% -She has developed a symptomatic CHF in earlier November  2017, etiology is unclear, possible cuddy myopathy from hypertension, versus infection induced cardiomyopathy, or chemotherapy related cardiomyopathy -She is on Lasix, managed by her cardiologist, symptoms improved lately   3. Epigastric abdomen pain from the subcutaneous lesion -She has developed a worsening pain from the epigastric subcutaneous lesion, which has been growing lately, likely a metastatic lesion. -I previously recommended her to consider ultrasound-guided needle biopsy to confirm metastasis, and we will consider surgical resection or radiation for palliation, I discussed with Dr. Johney Maine. -I would consider palliative radiation if her pain gets worse again. The pain seemed worse lately. -pt wants to see if chemo will improve her pain, will hold on RT for now -I suggest her to use EMLA cream to see if her pain would improve.   4 Iron deficient Anemia, and beta thalassemia trait  -Secondary to GI bleeding and iron deficiency -Ferritin level was 8, consistent with iron deficiency from GI bleeding -She received Feraheme 510 mg twice,  responded very well. Her anemia resolved, even she was getting chemotherapy. -her repeated iron study have showed normal iron level and ferritin.  5. Hypertension -She has no history of hypertension. Blood pressure has been slightly elevated in the past months, repeated normal. Possibly related to Avastin -I'll continue monitoring her blood pressure closely.  -Follow up with her cardiologist  6. Peripheral neuropathy,  G1 -Secondary to chemotherapy, which has been mild and stable, usually resolves before her next cycle chemo  -we'll watch closely. If gets worse, we'll consider dose reduction. -I previously recommend her to try Neurontin, she declined due to her sensitivity to sedative medication  7. Goal of care -We discussed her goal of care is palliative, to prolong her life and improve her quality of life -she agrees, but not ready to switch to palliative care alone    Plan -She will start her Xeloda tonight -She will restart Avastin next week, with Lasix 20 mg before infusion -Lab, follow-up and infusion in 3 weeks -I refilled oxycodone for her    All questions were answered. The patient knows to call the clinic with any problems, questions or concerns.  I spent 30 minutes counseling the patient face to face. The total time spent in the appointment was 35 minutes and more than 50% was on counseling.     Truitt Merle, MD 08/12/2016

## 2016-08-12 NOTE — Telephone Encounter (Signed)
"  I'm on my way from [place].  We're running about 15 minutes behind.  I have not received the chemotherapy pills.  I need to talk with her but I do not think I can receive treatment today."

## 2016-08-12 NOTE — Telephone Encounter (Signed)
Pt called to let Dr Burr Medico know she did receive her Xeloda today from [facility]. She states she has instructions for taking her xeloda

## 2016-08-17 ENCOUNTER — Telehealth: Payer: Self-pay | Admitting: Hematology

## 2016-08-17 NOTE — Telephone Encounter (Signed)
I spoke with Christina [name] and confirmed all appointment that she had with [place] (1/11 and 1/25)

## 2016-08-19 ENCOUNTER — Ambulatory Visit (HOSPITAL_BASED_OUTPATIENT_CLINIC_OR_DEPARTMENT_OTHER): Payer: BLUE CROSS/BLUE SHIELD

## 2016-08-19 ENCOUNTER — Other Ambulatory Visit: Payer: Self-pay | Admitting: *Deleted

## 2016-08-19 VITALS — BP 117/78 | HR 71 | Temp 98.1°F | Resp 18

## 2016-08-19 DIAGNOSIS — Z5112 Encounter for antineoplastic immunotherapy: Secondary | ICD-10-CM | POA: Diagnosis not present

## 2016-08-19 DIAGNOSIS — C189 Malignant neoplasm of colon, unspecified: Secondary | ICD-10-CM

## 2016-08-19 DIAGNOSIS — C787 Secondary malignant neoplasm of liver and intrahepatic bile duct: Principal | ICD-10-CM

## 2016-08-19 DIAGNOSIS — C187 Malignant neoplasm of sigmoid colon: Secondary | ICD-10-CM

## 2016-08-19 LAB — UA PROTEIN, DIPSTICK - CHCC: Protein, ur: <30 mg/dL

## 2016-08-19 MED ORDER — HEPARIN SOD (PORK) LOCK FLUSH 100 UNIT/ML IV SOLN
500.0000 [IU] | Freq: Once | INTRAVENOUS | Status: AC | PRN
  Administered 2016-08-19: 500 [IU]
  Filled 2016-08-19: qty 5

## 2016-08-19 MED ORDER — FUROSEMIDE 20 MG PO TABS
20.0000 mg | ORAL_TABLET | Freq: Once | ORAL | Status: AC
  Administered 2016-08-19: 20 mg via ORAL

## 2016-08-19 MED ORDER — SODIUM CHLORIDE 0.9 % IJ SOLN
10.0000 mL | INTRAMUSCULAR | Status: DC | PRN
  Administered 2016-08-19: 10 mL
  Filled 2016-08-19: qty 10

## 2016-08-19 MED ORDER — FUROSEMIDE 20 MG PO TABS
ORAL_TABLET | ORAL | Status: AC
  Filled 2016-08-19: qty 1

## 2016-08-19 MED ORDER — SODIUM CHLORIDE 0.9 % IV SOLN
5.5000 mg/kg | Freq: Once | INTRAVENOUS | Status: AC
  Administered 2016-08-19: 400 mg via INTRAVENOUS
  Filled 2016-08-19: qty 16

## 2016-08-19 MED ORDER — SODIUM CHLORIDE 0.9 % IV SOLN
Freq: Once | INTRAVENOUS | Status: AC
  Administered 2016-08-19: 16:00:00 via INTRAVENOUS

## 2016-08-19 NOTE — Patient Instructions (Signed)
Cancer Center Discharge Instructions for Patients Receiving Chemotherapy  Today you received the following chemotherapy agents Avastin   To help prevent nausea and vomiting after your treatment, we encourage you to take your nausea medication as directed.    If you develop nausea and vomiting that is not controlled by your nausea medication, call the clinic.   BELOW ARE SYMPTOMS THAT SHOULD BE REPORTED IMMEDIATELY:  *FEVER GREATER THAN 100.5 F  *CHILLS WITH OR WITHOUT FEVER  NAUSEA AND VOMITING THAT IS NOT CONTROLLED WITH YOUR NAUSEA MEDICATION  *UNUSUAL SHORTNESS OF BREATH  *UNUSUAL BRUISING OR BLEEDING  TENDERNESS IN MOUTH AND THROAT WITH OR WITHOUT PRESENCE OF ULCERS  *URINARY PROBLEMS  *BOWEL PROBLEMS  UNUSUAL RASH Items with * indicate a potential emergency and should be followed up as soon as possible.  Feel free to call the clinic you have any questions or concerns. The clinic phone number is [phone].  Please show the CHEMO ALERT CARD at check-in to the Emergency Department and triage nurse.   

## 2016-08-30 ENCOUNTER — Other Ambulatory Visit: Payer: Self-pay | Admitting: *Deleted

## 2016-08-30 ENCOUNTER — Telehealth: Payer: Self-pay | Admitting: *Deleted

## 2016-08-30 DIAGNOSIS — C189 Malignant neoplasm of colon, unspecified: Secondary | ICD-10-CM

## 2016-08-30 DIAGNOSIS — C787 Secondary malignant neoplasm of liver and intrahepatic bile duct: Principal | ICD-10-CM

## 2016-08-30 MED ORDER — CAPECITABINE 500 MG PO TABS: 1000.0000 mg/m2 | ORAL_TABLET | Freq: Two times a day (BID) | ORAL | 3 refills | Status: DC

## 2016-08-30 NOTE — Telephone Encounter (Signed)
"  I received a call this morning from [facility].  This pharmacy received an order for Remeron.  [facility] can't fill this because they see I received it from another pharmacy.  The only script [facility] needs to fill is the Capecitabine and they say they do not see this medicine on my medication list.  I do not understand what the problem is after we went through so much last month.  I am on my first day of rest so next month I'll need the refill to restart.  They can reach me at [phone]."

## 2016-08-30 NOTE — Telephone Encounter (Signed)
New script escribed to [facility] for Xeloda.  [place] and spoke with a pharmacist.  Was informed that script was received, and being processed by patient's assistance for help with copay.   Pt will be contacted when script is ready to be sent out. [place]    Phone       [phone].

## 2016-08-31 NOTE — Progress Notes (Signed)
[place]  Telephone:([phone] Fax:([phone]) [phone]  Clinic follow up Note   Patient Care Team: Benito Mccreedy, MD as PCP - General (Internal Medicine) Juanita Craver, MD as Consulting Physician (Gastroenterology) Michael Boston, MD as Consulting Physician (General Surgery) Truitt Merle, MD as Consulting Physician (Oncology) 09/02/2016  CHIEF COMPLAINTS:  Follow up metastatic colon cancer      Oncology History   Metastatic colon cancer to liver   Staging form: Colon and Rectum, AJCC 7th Edition     Pathologic stage from 04/08/2015: T3, N0, M1 - Signed by Truitt Merle, MD on 04/22/2015  Presented to ER with intractable N/V; intermittent rectal bleeding X 3 months      Metastatic colon cancer to liver ([place])   04/03/2015 Procedure    Colonoscopy showed a obstructing sigmoid rectal mass. Biopsy showed adenocarcinoma.      04/06/2015 Tumor Marker    CEA=467.3 / CA19.9=1605      04/06/2015 Imaging    CT chest, abdomen and pelvis with contrast showed sigmoid colon rectal mass, multiple (4) liver metastasis, with the largest 9.1 x 6.1 cm mass in the left lobe..      04/08/2015 Miscellaneous    Foundation one genomic testing showed NRAS G60e and BRAF [id number] mutations      04/08/2015 Initial Diagnosis    Metastatic colon cancer to liver      04/08/2015 Surgery    Laparoscopic sigmoid colectomy, liver biopsy, port cath insertion, by Dr. Johney Maine       04/08/2015 Pathology Results    Sigmoid colon segmental resection showed adenocarcinoma with mucinous features, pT 3, 15 lymph nodes all negative, surgical margins were negative. Liver biopsy showed metastatic adenocarcinoma.      05/08/2015 - 06/10/2016 Chemotherapy    FOLFIRI every 2 weeks, Avastin was added on from second cycle. Chemo was held when she developed severe CHF       07/14/2015 Imaging    CT abdomen and pelvis showed interval slight improvement in liver metastasis. No other new lesions.      10/12/2015 Imaging       CT abdomen and pelvis with contrast showed continued interval decrease in size of multiple hepatic metastases,  no other new lesions.      06/11/2016 Imaging    Ct chest, abdomen and pelvis W Contrast IMPRESSION: New small pleural effusions and bibasilar atelectasis, left side greater than right, of uncertain etiology. No thoracic masses or lymphadenopathy identified.  Stable left hepatic lobe metastasis. No new or progressive metastatic disease identified within the abdomen or pelvis.  Stable nodular carpal linear soft tissue density in midline ventral abdominal wall subcutaneous fat, which favors postsurgical scarring with tumor seeding of surgical tract considered less likely. Recommend continued attention on follow-up CT.  Stable uterine fibroids, cholelithiasis, and tiny hiatal hernia.       06/16/2016 - 06/20/2016 Hospital Admission    Pt was admitted for pneumonia and acute on chronic CHF      07/21/2016 Pathology Results    US Biopsy Diagnosis Soft Tissue Needle Core Biopsy, upper anterior abdomen - ADENOCARCINOMA, CONSISTENT WITH COLONIC PRIMARY.      08/12/2016 -  Chemotherapy    Xeloda 1g bid, 2 weeks on and 1 week off, and Avastin every 3 weeks         HISTORY OF PRESENTING ILLNESS:  Christina [name] 56 y.o. female with past medical history of endometriosis, uterine fibroids, thalassemia trait, iron deficient anemia, who was recently found to have a sigmoid  rectal cancer. I initially saw her in the hospital, she is here for the first follow-up.  She has been having intermittent rectal bleeding for the past 3 months. She thought it was related to her endometriosis, did not seek medical attention. She also has intermittent mild nausea, especially in the morning, and left lower quadrant abdominal pain. She was found to have profound anemia with hemoglobin 6.5 last week, and received blood transfusion. She was referred to gastroenterologist Dr. Collene Mares last week,  underwent colonoscopy 2 days ago, which showed a obstructing sigmoid rectal mass, per patient, the colonoscopy report is not available today. Biopsy was done, but the pathology report is still pending.  She developed severe nausea, and vomited several times with clear gastric liquid. She called [name], and I was told to come to [facility] emergency room by on-call physician Dr. Carlean Purl. She had a CT scan done in the emergency room today.  Her appetite was fairly normal up to 2 days ago before recent hospital admission, when she was found to have a colorectal mass. She lost about 45 pounds in the past few weeks. 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 family history was positive for colon cancer in her maternal cousin. She is married, lives with her husband, no children.  CURRENT THERAPY: Xeloda 2 g twice daily, 2 weeks on, one-week off, and Avastin every 3 weeks, started on 08/12/2016  INTERIM HISTORY [name] returns for follow-up and cycle 28 of treatment. She is doing well today. Her husband is not with her today due to work. She has some abdominal pain. She says she feels as if something is stretching her abdomen. She ranks the pain 6/10, after pain medication. She takes oxycodone about 1-3 times a day, depending on the pain of the day. She hasn't been wearing her life vest for the past 3 days because it is making the pain worse. She is off of the life vest in February. She has a repeat Echo in February. She is now able to walk very far and isn't having any SOB. She feels like her heart is doing pretty good. Her BP has been low. This morning it was 98 systolic. Her nose has been running a lot. Overall, she feels much better. She is off Xeloda this week. She starts again Friday or Saturday. Denies any other concerns.   MEDICAL HISTORY:  Past Medical History:  Diagnosis Date  . Endometriosis   . Hypertension   . met colon ca to liver  dx'd 03/2015  . Thalassanemia     SURGICAL HISTORY: Past Surgical History:  Procedure Laterality Date  . COLON RESECTION N/A 04/08/2015   Procedure: LAPAROSCOPIC  RESECTION OF PART OF  SIGMOID COLON;  Surgeon: Michael Boston, MD;  Location: [facility];  Service: General;  Laterality: N/A;  . DIAGNOSTIC LAPAROSCOPY     Endometriosis  . LAPAROSCOPIC SIGMOID COLECTOMY  04/08/2015   for colorectal cancer  . LIVER BIOPSY N/A 04/08/2015   Procedure: CORE NEEDLE LIVER BIOPSY;  Surgeon: Michael Boston, MD;  Location: [facility];  Service: General;  Laterality: N/A;  . MYOMECTOMY     Gyn in [place]  . PORTACATH PLACEMENT N/A 04/08/2015   Procedure: INSERTION PORT-A-CATH;  Surgeon: Michael Boston, MD;  Location: [facility];  Service: General;  Laterality: N/A;    SOCIAL HISTORY: Social History   Social History  . Marital status: Married    Spouse name: N/A  . Number  of children: N/A  . Years of education: N/A   Occupational History  . Internal Medicine doctor     Works in [place] Topics  . Smoking status: Never Smoker  . Smokeless tobacco: Never Used  . Alcohol use Yes     Comment: Once every four months.   . Drug use: No  . Sexual activity: Not on file   Other Topics Concern  . Not on file   Social History Narrative   Married, husband [name] (married X 2 years)   No children   IM Physician in [place]: Family History  Problem Relation Age of Onset  . Anesthesia problems Cousin 51    maternal cousin, colon cancer   . Clotting disorder Maternal Grandmother     ALLERGIES:  is allergic to lactose intolerance (gi); lasix [furosemide]; lorazepam; and milk-related compounds.  MEDICATIONS:  Current Outpatient Prescriptions  Medication Sig Dispense Refill  . albuterol (VENTOLIN HFA) 108 (90 Base) MCG/ACT inhaler Inhale 2 puffs into the lungs every 6 (six) hours as needed for wheezing or shortness of breath. 1 Inhaler 0  .  capecitabine (XELODA) 500 MG tablet Take 4 tablets (2,000 mg total) by mouth 2 (two) times daily after a meal. Takes on days 1-14 every 21 days 112 tablet 3  . carvedilol (COREG) 6.25 MG tablet Take 12.5 mg by mouth 2 (two) times daily with a meal.     . dexamethasone (DECADRON) 4 MG tablet Take 1 tablet (4 mg total) by mouth 2 (two) times daily with a meal. 10 tablet 1  . eplerenone (INSPRA) 25 MG tablet Take 25 mg by mouth daily.    . furosemide (LASIX) 40 MG tablet Take 1 tablet (40 mg total) by mouth daily. (Patient taking differently: Take 80 mg by mouth 2 (two) times daily. ) 30 tablet 1  . losartan (COZAAR) 25 MG tablet Take 1 tablet (25 mg total) by mouth daily. 30 tablet 0  . ondansetron (ZOFRAN) 4 MG tablet Take 1 tablet (4 mg total) by mouth every 8 (eight) hours as needed for nausea or vomiting. 30 tablet 2  . oxyCODONE (OXY IR/ROXICODONE) 5 MG immediate release tablet Take 1 tablet (5 mg total) by mouth every 6 (six) hours as needed for moderate pain. 90 tablet 0  . potassium chloride SA (K-DUR,KLOR-CON) 20 MEQ tablet Take 1 tablet (20 mEq total) by mouth daily. (Patient taking differently: Take 40 mEq by mouth 2 (two) times daily. ) 30 tablet 0  . prochlorperazine (COMPAZINE) 10 MG tablet Take 1 tablet (10 mg total) by mouth every 6 (six) hours as needed for nausea or vomiting. 60 tablet 1  . mirtazapine (REMERON) 15 MG tablet Take 1 tablet (15 mg total) by mouth at bedtime. (Patient not taking: Reported on 09/02/2016) 30 tablet 1   No current facility-administered medications for this visit.     REVIEW OF SYSTEMS: Constitutional: Denies fevers, chills or abnormal night sweats, (+) fatigue Eyes: Denies blurriness of vision, double vision or watery eyes Ears, nose, mouth, throat, and face: Denies mucositis or sore throat (+) runny nose Respiratory: Denies cough, dyspnea or wheezes, (+) mild congestion and dyspnea Cardiovascular: Denies palpitation, chest discomfort or lower extremity  swelling Gastrointestinal:  (+) nausea, (+) Epigastric pain from the subcutaneous nodule. Skin: Denies abnormal skin rashes Lymphatics: Denies new lymphadenopathy or easy bruising Neurological:Denies numbness, tingling or new weaknesses, (+) intermittent right flank pain Behavioral/Psych: Mood is stable, no  new changes  All other systems were reviewed with the patient and are negative.  PHYSICAL EXAMINATION: ECOG PERFORMANCE STATUS: 2  BP 129/79 (BP Location: Left Arm, Patient Position: Sitting)   Pulse (!) 58   Temp 98.5 F (36.9 C) (Oral)   Resp 18   Ht _0  (1.676 m)   Wt 161 lb 8 oz (73.3 kg)   SpO2 100%   BMI 26.07 kg/m    GENERAL:alert, no distress and comfortable SKIN: skin color, texture, turgor are normal, no rashes or significant lesions EYES: normal, conjunctiva are pink and non-injected, sclera clear OROPHARYNX:no exudate, no erythema and lips, buccal mucosa, and tongue normal  NECK: supple, thyroid normal size, non-tender, without nodularity LYMPH:  no palpable lymphadenopathy in the cervical, axillary or inguinal LUNGS: clear to auscultation and percussion with normal breathing effort. HEART: regular rate & rhythm and no murmurs and no lower extremity edema ABDOMEN:abdomen soft, non-tender and normal bowel sounds.  There is a 2.5cm subcutaneous nodule in the epigastric area, tender, no skin erythema. Biopsy site noted. (+) 1 x 4 cm subcutaneous nodule.  Musculoskeletal:no cyanosis of digits and no clubbing  PSYCH: alert & oriented x 3 with fluent speech NEURO: no focal motor/sensory deficits  LABORATORY DATA:  I have reviewed the data as listed CBC Latest Ref Rng & Units 09/02/2016 08/12/2016 07/22/2016  WBC 3.9 - 10.3 10e3/uL 9.4 5.9 6.6  Hemoglobin 11.6 - 15.9 g/dL 13.8 13.5 13.5  Hematocrit 34.8 - 46.6 % 44.2 43.6 44.8  Platelets 145 - 400 10e3/uL 396 411(H) 380    CMP Latest Ref Rng & Units 09/02/2016 08/12/2016 07/22/2016  Glucose 70 - 140 mg/dl 97 134 108    BUN 7.0 - 26.0 mg/dL 27.9(H) 24.2 26.9(H)  Creatinine 0.6 - 1.1 mg/dL 1.0 1.1 1.2(H)  Sodium 136 - 145 mEq/L 140 143 142  Potassium 3.5 - 5.1 mEq/L 3.4(L) 3.9 4.5  Chloride 101 - 111 mmol/L - - -  CO2 22 - 29 mEq/L _1 Calcium 8.4 - 10.4 mg/dL 9.8 9.6 9.3  Total Protein 6.4 - 8.3 g/dL 7.6 6.6 6.7  Total Bilirubin 0.20 - 1.20 mg/dL 0.98 1.39(H) 1.49(H)  Alkaline Phos 40 - 150 U/L 83 74 65  AST 5 - 34 U/L _2 ALT 0 - 55 U/L _3 CEA  06/19/2015: 457.8 08/14/2015: 442.6 12/04/2015: 293.1 03/04/2016: 132.1 04/15/2016: 140.7 05/27/2016: 122.56 07/08/2016: 109.7 07/22/16: 129 09/02/2016: 170.81  Diagnosis 04/08/2015 1. Liver, needle/core biopsy, ? cancer - METASTATIC ADENOCARCINOMA. 2. Colon, segmental resection for tumor, sigmoid colon mass open end proximal - COLONIC ADENOCARCINOMA WITH MUCINOUS FEATURES EXTENDING INTO PERICOLONIC ADIPOSE TISSUE AND SUBSEROSAL CONNECTIVE TISSUE. - MARGINS NOT INVOLVED. - FIFTEEN BENIGN LYMPH NODES (0/15). 3. Colon, resection margin (donut), distal anastomic ring - BENIGN COLON. - NO EVIDENCE OF MALIGNANCY.  Microscopic Comment Specimen: Sigmoid colon with liver biopsy and anastomotic rings. Procedure: Segmental resection with liver biopsy. Tumor site: Distal sigmoid. Specimen integrity: Intact. Macroscopic intactness of mesorectum: N/A Macroscopic tumor perforation: No. Invasive tumor: Maximum size: 8 cm Histologic type(s): Colorectal adenocarcinoma with mucinous features. Histologic grade and differentiation: G2: moderately differentiated/low grade Type of polyp in which invasive carcinoma arose: No residual polyp. Microscopic extension of invasive tumor: Into pericolonic adipose tissue and subserosal connective tissue. Lymph-Vascular invasion: Present. Peri-neural invasion: Present. Tumor deposit(s) (discontinuous extramural extension): No. Resection margins: Proximal margin: Free of tumor. Distal margin: Free of  tumor. Circumferential (radial) (posterior ascending, posterior descending; lateral  and posterior mid-rectum; and entire lower 1/3 rectum): N/A Mesenteric margin (sigmoid and transverse): Free of tumor. Distance closest margin (if all above margins negative): 2 cm from mesenteric margin. Treatment effect (neo-adjuvant therapy): No. Additional polyp(s): No. Non-neoplastic findings: None. Lymph nodes: number examined - 15; number positive: 0 Pathologic Staging: pT3, pN0, pM1 Ancillary studies: MMR pending. (JDP:gt, 04/10/15) 2. Mismatch Repair (MMR) Protein Immunohistochemistry (IHC) IHC Expression Result: MLH1: Preserved nuclear expression (greater 50% tumor expression) MSH2: Preserved nuclear expression (greater 50% tumor expression) MSH6: Preserved nuclear expression (greater 50% tumor expression) PMS2: Preserved nuclear expression (greater 50% tumor expression) * Internal control demonstrates intact nuclear expression Interpretation: NORMAL   FoundationOne test result    Surgical Pathology 07/21/2016 US Biopsy 07/21/16 Diagnosis Soft Tissue Needle Core Biopsy, upper anterior abdomen - ADENOCARCINOMA, CONSISTENT WITH COLONIC PRIMARY.  RADIOGRAPHIC STUDIES: I have personally reviewed the radiological images as listed and agreed with the findings in the report.  DG Chest 2 View 07/22/16 IMPRESSION: 1. Regressed but not completely resolved left lung base opacity. Possible small left pleural effusion. Followup PA and lateral chest X-ray is recommended in 4 weeksto ensure resolution. 2. No new cardiopulmonary abnormality. 3. Stable cardiomegaly.  Ct chest, abdomen and pelvis W Contrast 06/11/2016 IMPRESSION: New small pleural effusions and bibasilar atelectasis, left side greater than right, of uncertain etiology. No thoracic masses or lymphadenopathy identified.  Stable left hepatic lobe metastasis. No new or progressive metastatic disease identified within the abdomen or  pelvis.  Stable nodular carpal linear soft tissue density in midline ventral abdominal wall subcutaneous fat, which favors postsurgical scarring with tumor seeding of surgical tract considered less likely. Recommend continued attention on follow-up CT.  Stable uterine fibroids, cholelithiasis, and tiny hiatal hernia.  Bone scan 03/31/2016 IMPRESSION: Negative bone scan.  ASSESSMENT & PLAN:  56 y.o. African-American female, with past medical history of hypertension, thalassemia trait, iron deficient anemia, uterine fibroids, endometriosis, who presents with intermittent rectal bleeding, mild nausea, and mild intermittent left lower quadrant abdominal pain.   1. Sigmoid rectal adenocarcinoma, with liver metastases, pT3N0M1, stage IV, MMR normal, NRAS and BRAF mutation (+)  -I previously reviewed her surgical pathology findings with her in great details. Her liver biopsy confirmed metastatic adenocarcinoma  From colon cancer. Her primary sigmoid rectal tumor has been completely removed. -We previously reviewed her CT findings, it showed at least 4 liver metastatic lesion, with the largest one measuring 9.1 cm in the left lobe. No other distant metastasis -We previously reviewed the natural history of metastatic colon cancer, and incurable nature at this is stage, and palliative goal of therapy-Her tumor has BRAF mutation, which is a poor prognostic factor  -Her tumor has NRAS mutation, not a candidate for EGFR inhibitor  -Given the MSI-stable, she might not benefit from immunotherapy, except in a clinically trial setting  -I previously reviewed her restaging CT scan from 01/14/2016, which showed slight improvement in liver mets, no other new lesions, she has had a good partial response so far.  - her tumor marker CEA has been trending down lately, corresponding to good response to chemotherapy -I previously discussed her bone scan from 03/31/16, which showed no evidence of bone metastasis. -Her  muscle spasm and pain has resolved after she stopped omeprazole. -Her previous restaging CT scan from 06/11/2016 showed stable liver metastasis, but no other new cancer progression. -Due to her significant CHF, inability to tolerate IV fluids, her IV chemotherapy was held. The patient still has symptomatic CHF. -I previously discussed with  the patient that her US biopsy of the upper anterior abdomen nodule was positive for metastatic adenocarcinoma. Her pain has improved after the biopsy, but got worse lately, and the subcutaneous metastasis has significantly increased in size lately. We discussed the option of surgical resection versus palliative radiation, to control her pain. She is open to either options. I'll discuss with Dr. Johney Maine and Dr. Lisbeth Renshaw. -Due to her CHF, her IV chemotherapy has been held, she started Xeloda and Avastin 3 weeks ago. Burnis Medin obtain a restaging CT scan next week   2. CHF, with EF 20-25% -She has developed a symptomatic CHF in earlier November  2017, etiology is unclear, possible cuddy myopathy from hypertension, versus infection induced cardiomyopathy, or chemotherapy related cardiomyopathy -She is on Lasix, managed by her cardiologist, symptoms improved lately   3. Epigastric abdomen pain from the subcutaneous lesion -She has developed a worsening pain from the epigastric subcutaneous lesion, which has been growing lately, likely a metastatic lesion. -it has increased in size, and pain has been worse lately. We discussed the options of surgical resection or radiation for palliation.  -will discuss in our tumor board next week    4 Iron deficient Anemia, and beta thalassemia trait  -Secondary to GI bleeding and iron deficiency -She received Feraheme 510 mg twice,  responded very well. Her anemia resolved, even she was getting chemotherapy. -her repeated iron study have showed normal iron level and ferritin.  5. Hypertension -She has no history of hypertension. Blood  pressure has been slightly elevated in the past months, repeated normal. Possibly related to Avastin -I'll continue monitoring her blood pressure closely.  -Follow up with her cardiologist  6. Peripheral neuropathy, G1 -Secondary to chemotherapy, which has been mild and stable, usually resolves before her next cycle chemo  -we'll watch closely. If gets worse, we'll consider dose reduction. -I previously recommend her to try Neurontin, she declined due to her sensitivity to sedative medication  7. Goal of care -We discussed her goal of care is palliative, to prolong her life and improve her quality of life -she agrees, but not ready to switch to palliative care alone    Plan -Continue Xeloda and Avastin, with Lasix 20 mg before infusion -Stop Lasix 3 days before CT.  -Repeat CT in 1-2 weeks.  -Refill Compazine.  -I will message Dr. Lisbeth Renshaw and Dr. Johney Maine for the opinion on resection and radiation.  -Lab, follow-up and infusion in 3 weeks  All questions were answered. The patient knows to call the clinic with any problems, questions or concerns.  I spent 30 minutes counseling the patient face to face. The total time spent in the appointment was 35 minutes and more than 50% was on counseling.     Truitt Merle, MD 09/04/2016

## 2016-09-02 ENCOUNTER — Ambulatory Visit (HOSPITAL_BASED_OUTPATIENT_CLINIC_OR_DEPARTMENT_OTHER): Payer: BLUE CROSS/BLUE SHIELD

## 2016-09-02 ENCOUNTER — Ambulatory Visit (HOSPITAL_BASED_OUTPATIENT_CLINIC_OR_DEPARTMENT_OTHER): Payer: BLUE CROSS/BLUE SHIELD | Admitting: Hematology

## 2016-09-02 ENCOUNTER — Other Ambulatory Visit (HOSPITAL_BASED_OUTPATIENT_CLINIC_OR_DEPARTMENT_OTHER): Payer: BLUE CROSS/BLUE SHIELD

## 2016-09-02 VITALS — BP 129/79 | HR 58 | Temp 98.5°F | Resp 18 | Ht 66.0 in | Wt 161.5 lb

## 2016-09-02 DIAGNOSIS — C787 Secondary malignant neoplasm of liver and intrahepatic bile duct: Secondary | ICD-10-CM

## 2016-09-02 DIAGNOSIS — Z5112 Encounter for antineoplastic immunotherapy: Secondary | ICD-10-CM

## 2016-09-02 DIAGNOSIS — C19 Malignant neoplasm of rectosigmoid junction: Secondary | ICD-10-CM

## 2016-09-02 DIAGNOSIS — C189 Malignant neoplasm of colon, unspecified: Secondary | ICD-10-CM

## 2016-09-02 DIAGNOSIS — D561 Beta thalassemia: Secondary | ICD-10-CM

## 2016-09-02 DIAGNOSIS — D500 Iron deficiency anemia secondary to blood loss (chronic): Secondary | ICD-10-CM | POA: Diagnosis not present

## 2016-09-02 DIAGNOSIS — I420 Dilated cardiomyopathy: Secondary | ICD-10-CM

## 2016-09-02 DIAGNOSIS — I10 Essential (primary) hypertension: Secondary | ICD-10-CM

## 2016-09-02 DIAGNOSIS — I509 Heart failure, unspecified: Secondary | ICD-10-CM

## 2016-09-02 DIAGNOSIS — G620 Drug-induced polyneuropathy: Secondary | ICD-10-CM

## 2016-09-02 LAB — CBC WITH DIFFERENTIAL/PLATELET
BASO%: 0.2 % (ref 0.0–2.0)
BASOS ABS: 0 10*3/uL (ref 0.0–0.1)
EOS%: 9.7 % — AB (ref 0.0–7.0)
Eosinophils Absolute: 0.9 10*3/uL — ABNORMAL HIGH (ref 0.0–0.5)
HCT: 44.2 % (ref 34.8–46.6)
HGB: 13.8 g/dL (ref 11.6–15.9)
LYMPH%: 24.6 % (ref 14.0–49.7)
MCH: 21.7 pg — AB (ref 25.1–34.0)
MCHC: 31.2 g/dL — AB (ref 31.5–36.0)
MCV: 69.5 fL — ABNORMAL LOW (ref 79.5–101.0)
MONO#: 1.2 10*3/uL — ABNORMAL HIGH (ref 0.1–0.9)
MONO%: 13.2 % (ref 0.0–14.0)
NEUT#: 4.9 10*3/uL (ref 1.5–6.5)
NEUT%: 52.3 % (ref 38.4–76.8)
Platelets: 396 10*3/uL (ref 145–400)
RBC: 6.36 10*6/uL — AB (ref 3.70–5.45)
RDW: 21.7 % — AB (ref 11.2–14.5)
WBC: 9.4 10*3/uL (ref 3.9–10.3)
lymph#: 2.3 10*3/uL (ref 0.9–3.3)

## 2016-09-02 LAB — COMPREHENSIVE METABOLIC PANEL
ALBUMIN: 3.6 g/dL (ref 3.5–5.0)
ALK PHOS: 83 U/L (ref 40–150)
ALT: 8 U/L (ref 0–55)
AST: 17 U/L (ref 5–34)
Anion Gap: 13 mEq/L — ABNORMAL HIGH (ref 3–11)
BUN: 27.9 mg/dL — AB (ref 7.0–26.0)
CHLORIDE: 100 meq/L (ref 98–109)
CO2: 27 meq/L (ref 22–29)
Calcium: 9.8 mg/dL (ref 8.4–10.4)
Creatinine: 1 mg/dL (ref 0.6–1.1)
EGFR: 73 mL/min/{1.73_m2} — ABNORMAL LOW (ref >90)
GLUCOSE: 97 mg/dL (ref 70–140)
POTASSIUM: 3.4 meq/L — AB (ref 3.5–5.1)
SODIUM: 140 meq/L (ref 136–145)
Total Bilirubin: 0.98 mg/dL (ref 0.20–1.20)
Total Protein: 7.6 g/dL (ref 6.4–8.3)

## 2016-09-02 LAB — UA PROTEIN, DIPSTICK - CHCC: PROTEIN: NEGATIVE mg/dL

## 2016-09-02 LAB — CEA (IN HOUSE-CHCC): CEA (CHCC-IN HOUSE): 170.81 ng/mL — AB (ref 0.00–5.00)

## 2016-09-02 MED ORDER — SODIUM CHLORIDE 0.9 % IJ SOLN
10.0000 mL | INTRAMUSCULAR | Status: DC | PRN
  Administered 2016-09-02: 10 mL
  Filled 2016-09-02: qty 10

## 2016-09-02 MED ORDER — HEPARIN SOD (PORK) LOCK FLUSH 100 UNIT/ML IV SOLN
500.0000 [IU] | Freq: Once | INTRAVENOUS | Status: AC | PRN
  Administered 2016-09-02: 500 [IU]
  Filled 2016-09-02: qty 5

## 2016-09-02 MED ORDER — PROCHLORPERAZINE MALEATE 10 MG PO TABS: 10.0000 mg | ORAL_TABLET | Freq: Four times a day (QID) | ORAL | 1 refills | Status: DC | PRN

## 2016-09-02 MED ORDER — SODIUM CHLORIDE 0.9 % IV SOLN
Freq: Once | INTRAVENOUS | Status: AC
  Administered 2016-09-02: 14:00:00 via INTRAVENOUS

## 2016-09-02 MED ORDER — SODIUM CHLORIDE 0.9 % IV SOLN
5.5000 mg/kg | Freq: Once | INTRAVENOUS | Status: AC
  Administered 2016-09-02: 400 mg via INTRAVENOUS
  Filled 2016-09-02: qty 16

## 2016-09-02 MED ORDER — FUROSEMIDE 20 MG PO TABS: 20.0000 mg | ORAL_TABLET | Freq: Once | ORAL | Status: DC

## 2016-09-02 NOTE — Patient Instructions (Signed)
[facility] Discharge Instructions for Patients Receiving Chemotherapy  Today you received the following chemotherapy agents avastin  To help prevent nausea and vomiting after your treatment, we encourage you to take your nausea medication as directed   If you develop nausea and vomiting that is not controlled by your nausea medication, call the clinic.   BELOW ARE SYMPTOMS THAT SHOULD BE REPORTED IMMEDIATELY:  *FEVER GREATER THAN 100.5 F  *CHILLS WITH OR WITHOUT FEVER  NAUSEA AND VOMITING THAT IS NOT CONTROLLED WITH YOUR NAUSEA MEDICATION  *UNUSUAL SHORTNESS OF BREATH  *UNUSUAL BRUISING OR BLEEDING  TENDERNESS IN MOUTH AND THROAT WITH OR WITHOUT PRESENCE OF ULCERS  *URINARY PROBLEMS  *BOWEL PROBLEMS  UNUSUAL RASH Items with * indicate a potential emergency and should be followed up as soon as possible.  Feel free to call the clinic you have any questions or concerns. The clinic phone number is [phone].  

## 2016-09-04 ENCOUNTER — Encounter: Payer: Self-pay | Admitting: Hematology

## 2016-09-08 ENCOUNTER — Other Ambulatory Visit: Payer: Self-pay | Admitting: Hematology

## 2016-09-08 DIAGNOSIS — C189 Malignant neoplasm of colon, unspecified: Secondary | ICD-10-CM

## 2016-09-08 DIAGNOSIS — C787 Secondary malignant neoplasm of liver and intrahepatic bile duct: Principal | ICD-10-CM

## 2016-09-13 ENCOUNTER — Telehealth: Payer: Self-pay | Admitting: Hematology

## 2016-09-13 NOTE — Telephone Encounter (Signed)
Called and left a message on a voicemail reminder her of appointments

## 2016-09-16 ENCOUNTER — Ambulatory Visit (HOSPITAL_BASED_OUTPATIENT_CLINIC_OR_DEPARTMENT_OTHER): Payer: BLUE CROSS/BLUE SHIELD

## 2016-09-16 ENCOUNTER — Ambulatory Visit (HOSPITAL_BASED_OUTPATIENT_CLINIC_OR_DEPARTMENT_OTHER): Payer: BLUE CROSS/BLUE SHIELD | Admitting: Hematology

## 2016-09-16 ENCOUNTER — Encounter: Payer: Self-pay | Admitting: Hematology

## 2016-09-16 ENCOUNTER — Other Ambulatory Visit (HOSPITAL_BASED_OUTPATIENT_CLINIC_OR_DEPARTMENT_OTHER): Payer: BLUE CROSS/BLUE SHIELD

## 2016-09-16 ENCOUNTER — Ambulatory Visit: Payer: BLUE CROSS/BLUE SHIELD

## 2016-09-16 VITALS — BP 147/90 | HR 69 | Temp 98.5°F | Resp 18

## 2016-09-16 DIAGNOSIS — Z95828 Presence of other vascular implants and grafts: Secondary | ICD-10-CM

## 2016-09-16 DIAGNOSIS — C187 Malignant neoplasm of sigmoid colon: Secondary | ICD-10-CM

## 2016-09-16 DIAGNOSIS — D500 Iron deficiency anemia secondary to blood loss (chronic): Secondary | ICD-10-CM

## 2016-09-16 DIAGNOSIS — C189 Malignant neoplasm of colon, unspecified: Secondary | ICD-10-CM

## 2016-09-16 DIAGNOSIS — C787 Secondary malignant neoplasm of liver and intrahepatic bile duct: Principal | ICD-10-CM

## 2016-09-16 DIAGNOSIS — C7989 Secondary malignant neoplasm of other specified sites: Secondary | ICD-10-CM

## 2016-09-16 DIAGNOSIS — I10 Essential (primary) hypertension: Secondary | ICD-10-CM

## 2016-09-16 DIAGNOSIS — D561 Beta thalassemia: Secondary | ICD-10-CM

## 2016-09-16 DIAGNOSIS — G620 Drug-induced polyneuropathy: Secondary | ICD-10-CM

## 2016-09-16 DIAGNOSIS — Z5112 Encounter for antineoplastic immunotherapy: Secondary | ICD-10-CM | POA: Diagnosis not present

## 2016-09-16 LAB — CBC WITH DIFFERENTIAL/PLATELET
BASO%: 0.5 % (ref 0.0–2.0)
Basophils Absolute: 0 10*3/uL (ref 0.0–0.1)
EOS ABS: 0.6 10*3/uL — AB (ref 0.0–0.5)
EOS%: 9.8 % — ABNORMAL HIGH (ref 0.0–7.0)
HEMATOCRIT: 42.5 % (ref 34.8–46.6)
HGB: 13.1 g/dL (ref 11.6–15.9)
LYMPH#: 1.8 10*3/uL (ref 0.9–3.3)
LYMPH%: 29.5 % (ref 14.0–49.7)
MCH: 21.3 pg — ABNORMAL LOW (ref 25.1–34.0)
MCHC: 30.8 g/dL — AB (ref 31.5–36.0)
MCV: 69.1 fL — AB (ref 79.5–101.0)
MONO#: 0.7 10*3/uL (ref 0.1–0.9)
MONO%: 12 % (ref 0.0–14.0)
NEUT%: 48.2 % (ref 38.4–76.8)
NEUTROS ABS: 3 10*3/uL (ref 1.5–6.5)
PLATELETS: 268 10*3/uL (ref 145–400)
RBC: 6.14 10*6/uL — ABNORMAL HIGH (ref 3.70–5.45)
RDW: 23.6 % — ABNORMAL HIGH (ref 11.2–14.5)
WBC: 6.1 10*3/uL (ref 3.9–10.3)

## 2016-09-16 LAB — COMPREHENSIVE METABOLIC PANEL
ALBUMIN: 3.3 g/dL — AB (ref 3.5–5.0)
ALK PHOS: 83 U/L (ref 40–150)
ALT: 13 U/L (ref 0–55)
AST: 21 U/L (ref 5–34)
Anion Gap: 9 mEq/L (ref 3–11)
BUN: 18.9 mg/dL (ref 7.0–26.0)
CHLORIDE: 102 meq/L (ref 98–109)
CO2: 30 meq/L — AB (ref 22–29)
Calcium: 9.8 mg/dL (ref 8.4–10.4)
Creatinine: 0.9 mg/dL (ref 0.6–1.1)
EGFR: 87 mL/min/{1.73_m2} — ABNORMAL LOW (ref >90)
GLUCOSE: 92 mg/dL (ref 70–140)
POTASSIUM: 4 meq/L (ref 3.5–5.1)
Sodium: 141 mEq/L (ref 136–145)
Total Bilirubin: 1.56 mg/dL — ABNORMAL HIGH (ref 0.20–1.20)
Total Protein: 7.3 g/dL (ref 6.4–8.3)

## 2016-09-16 MED ORDER — SODIUM CHLORIDE 0.9 % IV SOLN
Freq: Once | INTRAVENOUS | Status: AC
  Administered 2016-09-16: 13:00:00 via INTRAVENOUS

## 2016-09-16 MED ORDER — SODIUM CHLORIDE 0.9 % IJ SOLN
3.0000 mL | INTRAMUSCULAR | Status: DC | PRN
  Filled 2016-09-16: qty 10

## 2016-09-16 MED ORDER — HEPARIN SOD (PORK) LOCK FLUSH 100 UNIT/ML IV SOLN
250.0000 [IU] | Freq: Once | INTRAVENOUS | Status: DC | PRN
  Filled 2016-09-16: qty 5

## 2016-09-16 MED ORDER — ALTEPLASE 2 MG IJ SOLR
2.0000 mg | Freq: Once | INTRAMUSCULAR | Status: DC | PRN
  Filled 2016-09-16: qty 2

## 2016-09-16 MED ORDER — SODIUM CHLORIDE 0.9 % IV SOLN
5.5000 mg/kg | Freq: Once | INTRAVENOUS | Status: AC
  Administered 2016-09-16: 400 mg via INTRAVENOUS
  Filled 2016-09-16: qty 16

## 2016-09-16 MED ORDER — FUROSEMIDE 20 MG PO TABS: 20.0000 mg | ORAL_TABLET | Freq: Once | ORAL | Status: DC

## 2016-09-16 MED ORDER — SODIUM CHLORIDE 0.9 % IJ SOLN
10.0000 mL | INTRAMUSCULAR | Status: DC | PRN
  Administered 2016-09-16: 10 mL
  Filled 2016-09-16: qty 10

## 2016-09-16 MED ORDER — HEPARIN SOD (PORK) LOCK FLUSH 100 UNIT/ML IV SOLN
500.0000 [IU] | Freq: Once | INTRAVENOUS | Status: AC | PRN
  Administered 2016-09-16: 500 [IU]
  Filled 2016-09-16: qty 5

## 2016-09-16 MED ORDER — SODIUM CHLORIDE 0.9 % IJ SOLN
10.0000 mL | INTRAMUSCULAR | Status: DC | PRN
  Administered 2016-09-16: 10 mL via INTRAVENOUS
  Filled 2016-09-16: qty 10

## 2016-09-16 NOTE — Patient Instructions (Signed)
[facility] Discharge Instructions for Patients Receiving Chemotherapy  Today you received the following chemotherapy agents; Avastin.   To help prevent nausea and vomiting after your treatment, we encourage you to take your nausea medication as directed.    If you develop nausea and vomiting that is not controlled by your nausea medication, call the clinic.   BELOW ARE SYMPTOMS THAT SHOULD BE REPORTED IMMEDIATELY:  *FEVER GREATER THAN 100.5 F  *CHILLS WITH OR WITHOUT FEVER  NAUSEA AND VOMITING THAT IS NOT CONTROLLED WITH YOUR NAUSEA MEDICATION  *UNUSUAL SHORTNESS OF BREATH  *UNUSUAL BRUISING OR BLEEDING  TENDERNESS IN MOUTH AND THROAT WITH OR WITHOUT PRESENCE OF ULCERS  *URINARY PROBLEMS  *BOWEL PROBLEMS  UNUSUAL RASH Items with * indicate a potential emergency and should be followed up as soon as possible.  Feel free to call the clinic you have any questions or concerns. The clinic phone number is [phone].    

## 2016-09-16 NOTE — Progress Notes (Signed)
[facility]  [phone] [phone]  Clinic follow up Note   Patient Care Team: Benito Mccreedy, MD as PCP - General (Internal Medicine) Juanita Craver, MD as Consulting Physician (Gastroenterology) Michael Boston, MD as Consulting Physician (General Surgery) Truitt Merle, MD as Consulting Physician (Oncology) 09/16/2016   CHIEF COMPLAINTS:  Follow up metastatic colon cancer      Oncology History   Metastatic colon cancer to liver   Staging form: Colon and Rectum, AJCC 7th Edition     Pathologic stage from 04/08/2015: T3, N0, M1 - Signed by Truitt Merle, MD on 04/22/2015  Presented to ER with intractable N/V; intermittent rectal bleeding X 3 months      Metastatic colon cancer to liver ([place])   04/03/2015 Procedure    Colonoscopy showed a obstructing sigmoid rectal mass. Biopsy showed adenocarcinoma.      04/06/2015 Tumor Marker    CEA=467.3 / CA19.9=1605      04/06/2015 Imaging    CT chest, abdomen and pelvis with contrast showed sigmoid colon rectal mass, multiple (4) liver metastasis, with the largest 9.1 x 6.1 cm mass in the left lobe..      04/08/2015 Miscellaneous    Foundation one genomic testing showed NRAS G60e and BRAF [id number] mutations      04/08/2015 Initial Diagnosis    Metastatic colon cancer to liver      04/08/2015 Surgery    Laparoscopic sigmoid colectomy, liver biopsy, port cath insertion, by Dr. Johney Maine       04/08/2015 Pathology Results    Sigmoid colon segmental resection showed adenocarcinoma with mucinous features, pT 3, 15 lymph nodes all negative, surgical margins were negative. Liver biopsy showed metastatic adenocarcinoma.      05/08/2015 - 06/10/2016 Chemotherapy    FOLFIRI every 2 weeks, Avastin was added on from second cycle. Chemo was held when she developed severe CHF       07/14/2015 Imaging    CT abdomen and pelvis showed interval slight improvement in liver metastasis. No other new lesions.      10/12/2015 Imaging       CT abdomen and pelvis with contrast showed continued interval decrease in size of multiple hepatic metastases,  no other new lesions.      06/11/2016 Imaging    Ct chest, abdomen and pelvis W Contrast IMPRESSION: New small pleural effusions and bibasilar atelectasis, left side greater than right, of uncertain etiology. No thoracic masses or lymphadenopathy identified.  Stable left hepatic lobe metastasis. No new or progressive metastatic disease identified within the abdomen or pelvis.  Stable nodular carpal linear soft tissue density in midline ventral abdominal wall subcutaneous fat, which favors postsurgical scarring with tumor seeding of surgical tract considered less likely. Recommend continued attention on follow-up CT.  Stable uterine fibroids, cholelithiasis, and tiny hiatal hernia.       06/16/2016 - 06/20/2016 Hospital Admission    Pt was admitted for pneumonia and acute on chronic CHF      07/21/2016 Pathology Results    US Biopsy Diagnosis Soft Tissue Needle Core Biopsy, upper anterior abdomen - ADENOCARCINOMA, CONSISTENT WITH COLONIC PRIMARY.      08/12/2016 -  Chemotherapy    Xeloda 2g bid, 2 weeks on and 1 week off, and Avastin every 2 weeks         HISTORY OF PRESENTING ILLNESS:  Christina [name] 56 y.o. female with past medical history of endometriosis, uterine fibroids, thalassemia trait, iron deficient anemia, who was recently found to have a  sigmoid rectal cancer. I initially saw her in the hospital, she is here for the first follow-up.  She has been having intermittent rectal bleeding for the past 3 months. She thought it was related to her endometriosis, did not seek medical attention. She also has intermittent mild nausea, especially in the morning, and left lower quadrant abdominal pain. She was found to have profound anemia with hemoglobin 6.5 last week, and received blood transfusion. She was referred to gastroenterologist Dr. Collene Mares last week,  underwent colonoscopy 2 days ago, which showed a obstructing sigmoid rectal mass, per patient, the colonoscopy report is not available today. Biopsy was done, but the pathology report is still pending.  She developed severe nausea, and vomited several times with clear gastric liquid. She called [name], and I was told to come to [facility] emergency room by on-call physician Dr. Carlean Purl. She had a CT scan done in the emergency room today.  Her appetite was fairly normal up to 2 days ago before recent hospital admission, when she was found to have a colorectal mass. She lost about 45 pounds in the past few weeks. 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 family history was positive for colon cancer in her maternal cousin. She is married, lives with her husband, no children.  CURRENT THERAPY: Xeloda 2 g twice daily, 2 weeks on, one-week off, and Avastin every 2 weeks, started on 08/12/2016  INTERIM HISTORY [name] returns for follow-up and treatment. She presents in the infusion chair. She has been overall doing well with less dyspnea and bit more energy. She still has moderate, sometimes severe, pain at the subcutaneous metastasis in the epigastric area. No other new pain. She is tolerating Xeloda well, the current cycle Xeloda was postponed for one week due to pharmacy and copay issue. Her appetite is decent, her weight is stable.   MEDICAL HISTORY:  Past Medical History:  Diagnosis Date  . Endometriosis   . Hypertension   . met colon ca to liver dx'd 03/2015  . Thalassanemia     SURGICAL HISTORY: Past Surgical History:  Procedure Laterality Date  . COLON RESECTION N/A 04/08/2015   Procedure: LAPAROSCOPIC  RESECTION OF PART OF  SIGMOID COLON;  Surgeon: Michael Boston, MD;  Location: [facility];  Service: General;  Laterality: N/A;  . DIAGNOSTIC LAPAROSCOPY     Endometriosis  . LAPAROSCOPIC SIGMOID COLECTOMY  04/08/2015   for colorectal  cancer  . LIVER BIOPSY N/A 04/08/2015   Procedure: CORE NEEDLE LIVER BIOPSY;  Surgeon: Michael Boston, MD;  Location: [facility];  Service: General;  Laterality: N/A;  . MYOMECTOMY     Gyn in [place]  . PORTACATH PLACEMENT N/A 04/08/2015   Procedure: INSERTION PORT-A-CATH;  Surgeon: Michael Boston, MD;  Location: [facility];  Service: General;  Laterality: N/A;    SOCIAL HISTORY: Social History   Social History  . Marital status: Married    Spouse name: N/A  . Number of children: N/A  . Years of education: N/A   Occupational History  . 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 Smoking status: Never Smoker  . Smokeless tobacco: Never Used  . Alcohol use Yes     Comment: Once every four months.   . Drug use: No  . Sexual activity: Not on file   Other Topics Concern  . Not on file   Social History Narrative  Married, husband [name] (married X 2 years)   No children   IM Physician in [place]: Family History  Problem Relation Age of Onset  . Anesthesia problems Cousin 29    maternal cousin, colon cancer   . Clotting disorder Maternal Grandmother     ALLERGIES:  is allergic to lactose intolerance (gi); lasix [furosemide]; lorazepam; and milk-related compounds.  MEDICATIONS:  Current Outpatient Prescriptions  Medication Sig Dispense Refill  . albuterol (VENTOLIN HFA) 108 (90 Base) MCG/ACT inhaler Inhale 2 puffs into the lungs every 6 (six) hours as needed for wheezing or shortness of breath. 1 Inhaler 0  . capecitabine (XELODA) 500 MG tablet Take 4 tablets (2,000 mg total) by mouth 2 (two) times daily after a meal. Takes on days 1-14 every 21 days 112 tablet 3  . carvedilol (COREG) 6.25 MG tablet Take 12.5 mg by mouth 2 (two) times daily with a meal.     . dexamethasone (DECADRON) 4 MG tablet Take 1 tablet (4 mg total) by mouth 2 (two) times daily with a meal. 10 tablet 1  . eplerenone (INSPRA) 25 MG tablet  Take 25 mg by mouth daily.    . furosemide (LASIX) 40 MG tablet Take 1 tablet (40 mg total) by mouth daily. (Patient taking differently: Take 80 mg by mouth 2 (two) times daily. ) 30 tablet 1  . losartan (COZAAR) 25 MG tablet Take 1 tablet (25 mg total) by mouth daily. 30 tablet 0  . mirtazapine (REMERON) 15 MG tablet Take 1 tablet (15 mg total) by mouth at bedtime. (Patient not taking: Reported on 09/02/2016) 30 tablet 1  . ondansetron (ZOFRAN) 4 MG tablet Take 1 tablet (4 mg total) by mouth every 8 (eight) hours as needed for nausea or vomiting. 30 tablet 2  . oxyCODONE (OXY IR/ROXICODONE) 5 MG immediate release tablet Take 1 tablet (5 mg total) by mouth every 6 (six) hours as needed for moderate pain. 90 tablet 0  . potassium chloride SA (K-DUR,KLOR-CON) 20 MEQ tablet Take 1 tablet (20 mEq total) by mouth daily. (Patient taking differently: Take 40 mEq by mouth 2 (two) times daily. ) 30 tablet 0  . prochlorperazine (COMPAZINE) 10 MG tablet Take 1 tablet (10 mg total) by mouth every 6 (six) hours as needed for nausea or vomiting. 60 tablet 1   No current facility-administered medications for this visit.     REVIEW OF SYSTEMS: Constitutional: Denies fevers, chills or abnormal night sweats, (+) fatigue Eyes: Denies blurriness of vision, double vision or watery eyes Ears, nose, mouth, throat, and face: Denies mucositis or sore throat (+) runny nose Respiratory: Denies cough, dyspnea or wheezes, (+) mild congestion and dyspnea Cardiovascular: Denies palpitation, chest discomfort or lower extremity swelling Gastrointestinal:  (+) nausea, (+) Epigastric pain from the subcutaneous nodule. Skin: Denies abnormal skin rashes Lymphatics: Denies new lymphadenopathy or easy bruising Neurological:Denies numbness, tingling or new weaknesses, (+) intermittent right flank pain Behavioral/Psych: Mood is stable, no new changes  All other systems were reviewed with the patient and are negative.  PHYSICAL  EXAMINATION: ECOG PERFORMANCE STATUS: 2 Blood pressure 147/90, heart rate 69, respiratory rate 18, temperature 36.9, pulse ox 100% on room air  GENERAL:alert, no distress and comfortable SKIN: skin color, texture, turgor are normal, no rashes or significant lesions EYES: normal, conjunctiva are pink and non-injected, sclera clear OROPHARYNX:no exudate, no erythema and lips, buccal mucosa, and tongue normal  NECK: supple, thyroid normal size, non-tender, without nodularity LYMPH:  no palpable lymphadenopathy in the cervical, axillary or inguinal LUNGS: clear to auscultation and percussion with normal breathing effort. HEART: regular rate & rhythm and no murmurs and no lower extremity edema ABDOMEN:abdomen soft, non-tender and normal bowel sounds.  There is a 2.5cm subcutaneous nodule in the epigastric area, tender, no skin erythema. Biopsy site noted. (+) 1.5 x 4 cm subcutaneous nodule.  Musculoskeletal:no cyanosis of digits and no clubbing  PSYCH: alert & oriented x 3 with fluent speech NEURO: no focal motor/sensory deficits  LABORATORY DATA:  I have reviewed the data as listed CBC Latest Ref Rng & Units 09/16/2016 09/02/2016 08/12/2016  WBC 3.9 - 10.3 10e3/uL 6.1 9.4 5.9  Hemoglobin 11.6 - 15.9 g/dL 13.1 13.8 13.5  Hematocrit 34.8 - 46.6 % 42.5 44.2 43.6  Platelets 145 - 400 10e3/uL 268 396 411(H)    CMP Latest Ref Rng & Units 09/16/2016 09/02/2016 08/12/2016  Glucose 70 - 140 mg/dl 92 97 134  BUN 7.0 - 26.0 mg/dL 18.9 27.9(H) 24.2  Creatinine 0.6 - 1.1 mg/dL 0.9 1.0 1.1  Sodium 136 - 145 mEq/L 141 140 143  Potassium 3.5 - 5.1 mEq/L 4.0 3.4(L) 3.9  Chloride 101 - 111 mmol/L - - -  CO2 22 - 29 mEq/L 30(H) 27 25  Calcium 8.4 - 10.4 mg/dL 9.8 9.8 9.6  Total Protein 6.4 - 8.3 g/dL 7.3 7.6 6.6  Total Bilirubin 0.20 - 1.20 mg/dL 1.56(H) 0.98 1.39(H)  Alkaline Phos 40 - 150 U/L 83 83 74  AST 5 - 34 U/L _0 ALT 0 - 55 U/L _1 CEA  06/19/2015: 457.8 08/14/2015: 442.6 12/04/2015:  293.1 03/04/2016: 132.1 04/15/2016: 140.7 05/27/2016: 122.56 07/08/2016: 109.7 07/22/16: 129 09/02/2016: 170.81  Diagnosis 04/08/2015 1. Liver, needle/core biopsy, ? cancer - METASTATIC ADENOCARCINOMA. 2. Colon, segmental resection for tumor, sigmoid colon mass open end proximal - COLONIC ADENOCARCINOMA WITH MUCINOUS FEATURES EXTENDING INTO PERICOLONIC ADIPOSE TISSUE AND SUBSEROSAL CONNECTIVE TISSUE. - MARGINS NOT INVOLVED. - FIFTEEN BENIGN LYMPH NODES (0/15). 3. Colon, resection margin (donut), distal anastomic ring - BENIGN COLON. - NO EVIDENCE OF MALIGNANCY.  Microscopic Comment Specimen: Sigmoid colon with liver biopsy and anastomotic rings. Procedure: Segmental resection with liver biopsy. Tumor site: Distal sigmoid. Specimen integrity: Intact. Macroscopic intactness of mesorectum: N/A Macroscopic tumor perforation: No. Invasive tumor: Maximum size: 8 cm Histologic type(s): Colorectal adenocarcinoma with mucinous features. Histologic grade and differentiation: G2: moderately differentiated/low grade Type of polyp in which invasive carcinoma arose: No residual polyp. Microscopic extension of invasive tumor: Into pericolonic adipose tissue and subserosal connective tissue. Lymph-Vascular invasion: Present. Peri-neural invasion: Present. Tumor deposit(s) (discontinuous extramural extension): No. Resection margins: Proximal margin: Free of tumor. Distal margin: Free of tumor. Circumferential (radial) (posterior ascending, posterior descending; lateral and posterior mid-rectum; and entire lower 1/3 rectum): N/A Mesenteric margin (sigmoid and transverse): Free of tumor. Distance closest margin (if all above margins negative): 2 cm from mesenteric margin. Treatment effect (neo-adjuvant therapy): No. Additional polyp(s): No. Non-neoplastic findings: None. Lymph nodes: number examined - 15; number positive: 0 Pathologic Staging: pT3, pN0, pM1 Ancillary studies: MMR pending.  (JDP:gt, 04/10/15) 2. Mismatch Repair (MMR) Protein Immunohistochemistry (IHC) IHC Expression Result: MLH1: Preserved nuclear expression (greater 50% tumor expression) MSH2: Preserved nuclear expression (greater 50% tumor expression) MSH6: Preserved nuclear expression (greater 50% tumor expression) PMS2: Preserved nuclear expression (greater 50% tumor expression) * Internal control demonstrates intact nuclear expression Interpretation: NORMAL   FoundationOne test result    Surgical Pathology 07/21/2016 US Biopsy  07/21/16 Diagnosis Soft Tissue Needle Core Biopsy, upper anterior abdomen - ADENOCARCINOMA, CONSISTENT WITH COLONIC PRIMARY.  RADIOGRAPHIC STUDIES: I have personally reviewed the radiological images as listed and agreed with the findings in the report.  DG Chest 2 View 07/22/16 IMPRESSION: 1. Regressed but not completely resolved left lung base opacity. Possible small left pleural effusion. Followup PA and lateral chest X-ray is recommended in 4 weeksto ensure resolution. 2. No new cardiopulmonary abnormality. 3. Stable cardiomegaly.  Ct chest, abdomen and pelvis W Contrast 06/11/2016 IMPRESSION: New small pleural effusions and bibasilar atelectasis, left side greater than right, of uncertain etiology. No thoracic masses or lymphadenopathy identified.  Stable left hepatic lobe metastasis. No new or progressive metastatic disease identified within the abdomen or pelvis.  Stable nodular carpal linear soft tissue density in midline ventral abdominal wall subcutaneous fat, which favors postsurgical scarring with tumor seeding of surgical tract considered less likely. Recommend continued attention on follow-up CT.  Stable uterine fibroids, cholelithiasis, and tiny hiatal hernia.  Bone scan 03/31/2016 IMPRESSION: Negative bone scan.  ASSESSMENT & PLAN:  56 y.o. African-American female, with past medical history of hypertension, thalassemia trait, iron deficient  anemia, uterine fibroids, endometriosis, who presents with intermittent rectal bleeding, mild nausea, and mild intermittent left lower quadrant abdominal pain.   1. Sigmoid rectal adenocarcinoma, with liver metastases, pT3N0M1, stage IV, MMR normal, NRAS and BRAF mutation (+)  -I previously reviewed her surgical pathology findings with her in great details. Her liver biopsy confirmed metastatic adenocarcinoma  From colon cancer. Her primary sigmoid rectal tumor has been completely removed. -We previously reviewed her CT findings, it showed at least 4 liver metastatic lesion, with the largest one measuring 9.1 cm in the left lobe. No other distant metastasis -We previously reviewed the natural history of metastatic colon cancer, and incurable nature at this is stage, and palliative goal of therapy-Her tumor has BRAF mutation, which is a poor prognostic factor  -Her tumor has NRAS mutation, not a candidate for EGFR inhibitor  -Given the MSI-stable, she might not benefit from immunotherapy, except in a clinically trial setting  -She had a durable response to first-line chemotherapy FOLFIRI and Avastin, but unfortunately developed significant heart failure, and IV chemotherapy was held November 2017 -I have restarted her on Xeloda and Avastin in January 2018, she has been tolerating well. -I plan to have a PET scan to evaluate her metastatic disease, if she has limited liver metastasis and subcutaneous met in the epigastric area, no other mets, IR is willing to consider local therpay, this was discussed in our GI tumor board   2. CHF, with EF 20-25% -She has developed a symptomatic CHF in earlier November  2017, etiology is unclear, possible cuddy myopathy from hypertension, versus infection induced cardiomyopathy, or chemotherapy related cardiomyopathy -She is on Lasix, managed by her cardiologist, symptoms improved lately   3. Epigastric abdomen pain from the subcutaneous metastasis  -She has  developed a worsening pain from the epigastric subcutaneous lesion, which has been growing lately, biopsy has confirmed metastatic lesion. -it has increased in size, and pain has been worse lately. We discussed the options of surgical resection, radiation or  cryoablation by IR for palliation. Pt is interested in ablation for pain control    4 Iron deficient Anemia, and beta thalassemia trait  -Secondary to GI bleeding and iron deficiency -She received Feraheme 510 mg twice,  responded very well. Her anemia resolved, even she was getting chemotherapy.  5. Hypertension -She has no history of  hypertension. Blood pressure has been slightly elevated in the past months, repeated normal. Possibly related to Avastin -I'll continue monitoring her blood pressure closely.  -Follow up with her cardiologist  6. Peripheral neuropathy, G1 -Secondary to chemotherapy, which has been mild and stable, usually resolves before her next cycle chemo  -we'll watch closely. If gets worse, we'll consider dose reduction. -I previously recommend her to try Neurontin, she declined due to her sensitivity to sedative medication  7. Goal of care -We discussed her goal of care is palliative, to prolong her life and improve her quality of life -she agrees, but not ready to switch to palliative care alone  -she is full code for now    Plan -Continue Xeloda and Avastin, with Lasix 10 mg before infusion -Lab, follow-up and infusion in 2 weeks -PET next week   All questions were answered. The patient knows to call the clinic with any problems, questions or concerns.  I spent 30 minutes counseling the patient face to face. The total time spent in the appointment was 35 minutes and more than 50% was on counseling.  This document serves as a record of services personally performed by Truitt Merle, MD. It was created on her behalf by Martinique Casey, a trained medical scribe. The creation of this record is based on the scribe's  personal observations and the provider's statements to them. This document has been checked and approved by the attending provider.  I have reviewed the above documentation for accuracy and completeness and I agree with the above.    Truitt Merle, MD 09/16/2016

## 2016-09-17 ENCOUNTER — Telehealth: Payer: Self-pay | Admitting: Hematology

## 2016-09-17 NOTE — Telephone Encounter (Signed)
sw pt to inform of cxl appts on 2/20 and r/s to 2/22 per LOS

## 2016-09-23 ENCOUNTER — Encounter (HOSPITAL_COMMUNITY)
Admission: RE | Admit: 2016-09-23 | Discharge: 2016-09-23 | Disposition: A | Discharge status: Home / Self Care | Payer: BLUE CROSS/BLUE SHIELD | Source: Ambulatory Visit | Attending: Hematology | Admitting: Hematology

## 2016-09-23 DIAGNOSIS — C189 Malignant neoplasm of colon, unspecified: Secondary | ICD-10-CM

## 2016-09-23 DIAGNOSIS — C787 Secondary malignant neoplasm of liver and intrahepatic bile duct: Secondary | ICD-10-CM | POA: Insufficient documentation

## 2016-09-24 ENCOUNTER — Encounter (HOSPITAL_COMMUNITY)
Admission: RE | Admit: 2016-09-24 | Discharge: 2016-09-24 | Disposition: A | Discharge status: Home / Self Care | Payer: BLUE CROSS/BLUE SHIELD | Source: Ambulatory Visit | Attending: Hematology | Admitting: Hematology

## 2016-09-24 DIAGNOSIS — C786 Secondary malignant neoplasm of retroperitoneum and peritoneum: Secondary | ICD-10-CM | POA: Insufficient documentation

## 2016-09-24 DIAGNOSIS — C189 Malignant neoplasm of colon, unspecified: Secondary | ICD-10-CM | POA: Diagnosis present

## 2016-09-24 DIAGNOSIS — C787 Secondary malignant neoplasm of liver and intrahepatic bile duct: Secondary | ICD-10-CM | POA: Diagnosis not present

## 2016-09-24 DIAGNOSIS — C792 Secondary malignant neoplasm of skin: Secondary | ICD-10-CM | POA: Insufficient documentation

## 2016-09-24 DIAGNOSIS — I2510 Atherosclerotic heart disease of native coronary artery without angina pectoris: Secondary | ICD-10-CM | POA: Diagnosis not present

## 2016-09-24 DIAGNOSIS — I517 Cardiomegaly: Secondary | ICD-10-CM | POA: Diagnosis not present

## 2016-09-24 DIAGNOSIS — I700 Atherosclerosis of aorta: Secondary | ICD-10-CM | POA: Diagnosis not present

## 2016-09-24 LAB — GLUCOSE, CAPILLARY: GLUCOSE-CAPILLARY: 99 mg/dL (ref 65–99)

## 2016-09-24 MED ORDER — FLUDEOXYGLUCOSE F - 18 (FDG) INJECTION
7.7700 | Freq: Once | INTRAVENOUS | Status: AC | PRN
  Administered 2016-09-24: 7.77 via INTRAVENOUS

## 2016-09-27 ENCOUNTER — Telehealth: Payer: Self-pay

## 2016-09-27 NOTE — Telephone Encounter (Signed)
Pt called for a refill of her pain medication

## 2016-09-28 ENCOUNTER — Telehealth: Payer: Self-pay | Admitting: *Deleted

## 2016-09-28 ENCOUNTER — Ambulatory Visit: Payer: Self-pay | Admitting: Hematology

## 2016-09-28 ENCOUNTER — Other Ambulatory Visit: Payer: Self-pay

## 2016-09-28 ENCOUNTER — Ambulatory Visit: Payer: Self-pay

## 2016-09-28 DIAGNOSIS — C787 Secondary malignant neoplasm of liver and intrahepatic bile duct: Principal | ICD-10-CM

## 2016-09-28 DIAGNOSIS — C189 Malignant neoplasm of colon, unspecified: Secondary | ICD-10-CM

## 2016-09-28 MED ORDER — OXYCODONE HCL 5 MG PO TABS: 5.0000 mg | ORAL_TABLET | Freq: Four times a day (QID) | ORAL | 0 refills | Status: DC | PRN

## 2016-09-28 NOTE — Telephone Encounter (Signed)
Refill printed per message from Dr. Burr Medico. Patient called to notify ready for pick up with proper id- message left. Rx in pick up book.

## 2016-09-28 NOTE — Telephone Encounter (Signed)
Please refill, thanks   Ashea Winiarski MD  

## 2016-09-29 NOTE — Progress Notes (Signed)
[place]  Telephone:([phone]) [phone] [phone]  Clinic follow up Note   Patient Care Team: Benito Mccreedy, MD as PCP - General (Internal Medicine) Juanita Craver, MD as Consulting Physician (Gastroenterology) Michael Boston, MD as Consulting Physician (General Surgery) Truitt Merle, MD as Consulting Physician (Oncology) 09/30/2016   CHIEF COMPLAINTS:  Follow up metastatic colon cancer      Oncology History   Metastatic colon cancer to liver   Staging form: Colon and Rectum, AJCC 7th Edition     Pathologic stage from 04/08/2015: T3, N0, M1 - Signed by Truitt Merle, MD on 04/22/2015  Presented to ER with intractable N/V; intermittent rectal bleeding X 3 months      Metastatic colon cancer to liver ([place])   04/03/2015 Procedure    Colonoscopy showed a obstructing sigmoid rectal mass. Biopsy showed adenocarcinoma.      04/06/2015 Tumor Marker    CEA=467.3 / CA19.9=1605      04/06/2015 Imaging    CT chest, abdomen and pelvis with contrast showed sigmoid colon rectal mass, multiple (4) liver metastasis, with the largest 9.1 x 6.1 cm mass in the left lobe..      04/08/2015 Miscellaneous    Foundation one genomic testing showed NRAS G60e and BRAF [id number] mutations      04/08/2015 Initial Diagnosis    Metastatic colon cancer to liver      04/08/2015 Surgery    Laparoscopic sigmoid colectomy, liver biopsy, port cath insertion, by Dr. Johney Maine       04/08/2015 Pathology Results    Sigmoid colon segmental resection showed adenocarcinoma with mucinous features, pT 3, 15 lymph nodes all negative, surgical margins were negative. Liver biopsy showed metastatic adenocarcinoma.      05/08/2015 - 06/10/2016 Chemotherapy    FOLFIRI every 2 weeks, Avastin was added on from second cycle. Chemo was held when she developed severe CHF       07/14/2015 Imaging    CT abdomen and pelvis showed interval slight improvement in liver metastasis. No other new lesions.      10/12/2015 Imaging       CT abdomen and pelvis with contrast showed continued interval decrease in size of multiple hepatic metastases,  no other new lesions.      06/11/2016 Imaging    Ct chest, abdomen and pelvis W Contrast IMPRESSION: New small pleural effusions and bibasilar atelectasis, left side greater than right, of uncertain etiology. No thoracic masses or lymphadenopathy identified.  Stable left hepatic lobe metastasis. No new or progressive metastatic disease identified within the abdomen or pelvis.  Stable nodular carpal linear soft tissue density in midline ventral abdominal wall subcutaneous fat, which favors postsurgical scarring with tumor seeding of surgical tract considered less likely. Recommend continued attention on follow-up CT.  Stable uterine fibroids, cholelithiasis, and tiny hiatal hernia.       06/16/2016 - 06/20/2016 Hospital Admission    Pt was admitted for pneumonia and acute on chronic CHF      07/21/2016 Pathology Results    US Biopsy Diagnosis Soft Tissue Needle Core Biopsy, upper anterior abdomen - ADENOCARCINOMA, CONSISTENT WITH COLONIC PRIMARY.      08/12/2016 -  Chemotherapy    Xeloda 2g bid, 2 weeks on and 1 week off, and Avastin every 2 weeks       09/24/2016 PET scan    1. New hypermetabolic peritoneal carcinomatosis, including numerous new hypermetabolic omental metastases throughout the abdomen and pelvis and hypermetabolic gastrohepatic ligament metastases. 2. Interval growth of intensely  hypermetabolic biopsy-proven subcutaneous metastasis in the midline ventral upper abdominal wall. 3. Interval progression of hypermetabolic liver metastatic disease. 4. Trace pericardial effusion/thickening. Mild cardiomegaly. Trace dependent bilateral pleural effusions. 5. Aortic atherosclerosis. Two-vessel coronary atherosclerosis.       HISTORY OF PRESENTING ILLNESS:  Christina [name] 56 y.o. female with past medical history of endometriosis, uterine  fibroids, thalassemia trait, iron deficient anemia, who was recently found to have a sigmoid rectal cancer. I initially saw her in the hospital, she is here for the first follow-up.  She has been having intermittent rectal bleeding for the past 3 months. She thought it was related to her endometriosis, did not seek medical attention. She also has intermittent mild nausea, especially in the morning, and left lower quadrant abdominal pain. She was found to have profound anemia with hemoglobin 6.5 last week, and received blood transfusion. She was referred to gastroenterologist Dr. Collene Mares last week, underwent colonoscopy 2 days ago, which showed a obstructing sigmoid rectal mass, per patient, the colonoscopy report is not available today. Biopsy was done, but the pathology report is still pending.  She developed severe nausea, and vomited several times with clear gastric liquid. She called [name], and I was told to come to [facility] emergency room by on-call physician Dr. Carlean Purl. She had a CT scan done in the emergency room today.  Her appetite was fairly normal up to 2 days ago before recent hospital admission, when she was found to have a colorectal mass. She lost about 45 pounds in the past few weeks. 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 family history was positive for colon cancer in her maternal cousin. She is married, lives with her husband, no children.  CURRENT THERAPY: Xeloda 2 g twice daily, 2 weeks on, one-week off, and Avastin every 2 weeks, started on 08/12/2016  INTERIM HISTORY [name] returns for follow-up and treatment. The pain in her abdomen is getting worse. The pain is constant and feels like her skin is pulling. This has been since her biopsy. She still has some abdominal swelling from the biopsy. She feels like the mass is getting bigger and shifting position. Her breathing has been much better. Her cardiologist says  everything looks good, but she does need a repeat Echo before her ablation. She is now taking 40 mg Lasix a day. She is drinking water, but not 64 oz. She is starting Xeloda today. She had a burning sensation in her feet, but she got shoes with better support and doesn't stand on her feet too long, and the pain is gone now. Denies leg swelling, cough, fatigue, or any other concerns. She is working more now. She is working full time.   MEDICAL HISTORY:  Past Medical History:  Diagnosis Date   Endometriosis    Hypertension    met colon ca to liver dx'd 03/2015   Thalassanemia     SURGICAL HISTORY: Past Surgical History:  Procedure Laterality Date   COLON RESECTION N/A 04/08/2015   Procedure: LAPAROSCOPIC  RESECTION OF PART OF  SIGMOID COLON;  Surgeon: Michael Boston, MD;  Location: [facility];  Service: General;  Laterality: N/A;   DIAGNOSTIC LAPAROSCOPY     Endometriosis   LAPAROSCOPIC SIGMOID COLECTOMY  04/08/2015   for colorectal cancer   LIVER BIOPSY N/A 04/08/2015   Procedure: CORE NEEDLE LIVER BIOPSY;  Surgeon: Michael Boston, MD;  Location: [facility];  Service: General;  Laterality: N/A;   MYOMECTOMY  Gyn in [place] N/A 04/08/2015   Procedure: INSERTION PORT-A-CATH;  Surgeon: Michael Boston, MD;  Location: [facility];  Service: General;  Laterality: N/A;    SOCIAL HISTORY: Social History   Social History   Marital status: Married    Spouse name: N/A   Number of children: N/A   Years of education: N/A   Occupational History   Internal Medicine doctor     Works in [employer]   Smoking status: Never Smoker   Smokeless tobacco: Never Used   Alcohol use Yes     Comment: Once every four months.    Drug use: No   Sexual activity: Not on file   Other Topics Concern   Not on file   Social History Narrative   Married, husband [name] (married X 2 years)   No children   IM Physician in [place]: Family History  Problem Relation Age of Onset   Anesthesia problems Cousin 14    maternal cousin, colon cancer    Clotting disorder Maternal Grandmother     ALLERGIES:  is allergic to lactose intolerance (gi); lasix [furosemide]; lorazepam; and milk-related compounds.  MEDICATIONS:  Current Outpatient Prescriptions  Medication Sig Dispense Refill   carvedilol (COREG) 6.25 MG tablet Take 12.5 mg by mouth 2 (two) times daily with a meal.      eplerenone (INSPRA) 25 MG tablet Take 25 mg by mouth daily.     furosemide (LASIX) 40 MG tablet Take 1 tablet (40 mg total) by mouth daily. (Patient taking differently: Take 80 mg by mouth 2 (two) times daily. ) 30 tablet 1   losartan (COZAAR) 25 MG tablet Take 1 tablet (25 mg total) by mouth daily. 30 tablet 0   ondansetron (ZOFRAN) 4 MG tablet Take 1 tablet (4 mg total) by mouth every 8 (eight) hours as needed for nausea or vomiting. 30 tablet 2   potassium chloride SA (K-DUR,KLOR-CON) 20 MEQ tablet Take 1 tablet (20 mEq total) by mouth daily. (Patient taking differently: Take 40 mEq by mouth 2 (two) times daily. ) 30 tablet 0   prochlorperazine (COMPAZINE) 10 MG tablet Take 1 tablet (10 mg total) by mouth every 6 (six) hours as needed for nausea or vomiting. 60 tablet 1   albuterol (VENTOLIN HFA) 108 (90 Base) MCG/ACT inhaler Inhale 2 puffs into the lungs every 6 (six) hours as needed for wheezing or shortness of breath. (Patient not taking: Reported on 09/30/2016) 1 Inhaler 0   capecitabine (XELODA) 500 MG tablet Take 4 tablets (2,000 mg total) by mouth 2 (two) times daily after a meal. Takes on days 1-14 every 21 days (Patient not taking: Reported on 09/30/2016) 112 tablet 3   dexamethasone (DECADRON) 4 MG tablet Take 1 tablet (4 mg total) by mouth 2 (two) times daily with a meal. (Patient not taking: Reported on 09/30/2016) 10 tablet 1   mirtazapine (REMERON) 15 MG tablet Take 1 tablet (15 mg total) by mouth at bedtime.  (Patient not taking: Reported on 09/02/2016) 30 tablet 1   oxyCODONE (OXY IR/ROXICODONE) 5 MG immediate release tablet Take 1 tablet (5 mg total) by mouth every 6 (six) hours as needed for moderate pain. 90 tablet 0   No current facility-administered medications for this visit.    Facility-Administered Medications Ordered in Other Visits  Medication Dose Route Frequency Provider Last Rate Last Dose   bevacizumab (AVASTIN) 400 mg in sodium chloride  0.9 % 100 mL chemo infusion  5.5 mg/kg (Treatment Plan Recorded) Intravenous Once Truitt Merle, MD 696 mL/hr at 09/30/16 1456 400 mg at 09/30/16 1456   heparin lock flush 100 unit/mL  500 Units Intracatheter Once PRN Truitt Merle, MD       sodium chloride 0.9 % injection 10 mL  10 mL Intracatheter PRN Truitt Merle, MD        REVIEW OF SYSTEMS: Constitutional: Denies fevers, chills or abnormal night sweats,  Eyes: Denies blurriness of vision, double vision or watery eyes Ears, nose, mouth, throat, and face: Denies mucositis or sore throat Respiratory: Denies cough, dyspnea or wheezes,  Cardiovascular: Denies palpitation, chest discomfort or lower extremity swelling Gastrointestinal:  (+) Epigastric pain from the subcutaneous nodule, abdominal swelling,  Skin: Denies abnormal skin rashes Lymphatics: Denies new lymphadenopathy or easy bruising Neurological:Denies numbness, tingling or new weaknesses,  Behavioral/Psych: Mood is stable, no new changes  All other systems were reviewed with the patient and are negative.  PHYSICAL EXAMINATION: ECOG PERFORMANCE STATUS: 2  BP 120/73 (BP Location: Left Arm, Patient Position: Sitting)    Pulse 70    Temp 98.6 F (37 C) (Oral)    Resp 18    Ht _0  (1.676 m)    Wt 162 lb 3.2 oz (73.6 kg)    SpO2 100%    BMI 26.18 kg/m   GENERAL:alert, no distress and comfortable SKIN: skin color, texture, turgor are normal, no rashes or significant lesions EYES: normal, conjunctiva are pink and non-injected, sclera  clear OROPHARYNX:no exudate, no erythema and lips, buccal mucosa, and tongue normal  NECK: supple, thyroid normal size, non-tender, without nodularity LYMPH:  no palpable lymphadenopathy in the cervical, axillary or inguinal LUNGS: clear to auscultation and percussion with normal breathing effort. HEART: regular rate & rhythm and no murmurs and no lower extremity edema ABDOMEN:abdomen soft, non-tender and normal bowel sounds.  There is a 2.5cm subcutaneous nodule in the epigastric area, tender, no skin erythema. Biopsy site noted. (+) 1.5 x 4.5 cm subcutaneous nodule in epigastric area.  Musculoskeletal:no cyanosis of digits and no clubbing  PSYCH: alert & oriented x 3 with fluent speech NEURO: no focal motor/sensory deficits  LABORATORY DATA:  I have reviewed the data as listed CBC Latest Ref Rng & Units 09/30/2016 09/16/2016 09/02/2016  WBC 3.9 - 10.3 10e3/uL 5.7 6.1 9.4  Hemoglobin 11.6 - 15.9 g/dL 9.9(L) 13.1 13.8  Hematocrit 34.8 - 46.6 % 32.5(L) 42.5 44.2  Platelets 145 - 400 10e3/uL 242 268 396    CMP Latest Ref Rng & Units 09/30/2016 09/16/2016 09/02/2016  Glucose 70 - 140 mg/dl 106 92 97  BUN 7.0 - 26.0 mg/dL 25.4 18.9 27.9(H)  Creatinine 0.6 - 1.1 mg/dL 1.5(H) 0.9 1.0  Sodium 136 - 145 mEq/L 139 141 140  Potassium 3.5 - 5.1 mEq/L 4.3 4.0 3.4(L)  Chloride 101 - 111 mmol/L - - -  CO2 22 - 29 mEq/L 26 30(H) 27  Calcium 8.4 - 10.4 mg/dL 9.8 9.8 9.8  Total Protein 6.4 - 8.3 g/dL 7.2 7.3 7.6  Total Bilirubin 0.20 - 1.20 mg/dL 1.36(H) 1.56(H) 0.98  Alkaline Phos 40 - 150 U/L 89 83 83  AST 5 - 34 U/L _1 ALT 0 - 55 U/L _2 CEA  06/19/2015: 457.8 08/14/2015: 442.6 12/04/2015: 293.1 03/04/2016: 132.1 04/15/2016: 140.7 05/27/2016: 122.56 07/08/2016: 109.7 07/22/16: 129 09/02/2016: 170.81 09/30/2016: PENDING  Diagnosis 04/08/2015 1. Liver, needle/core biopsy, ? cancer - METASTATIC ADENOCARCINOMA.  2. Colon, segmental resection for tumor, sigmoid colon mass open end  proximal - COLONIC ADENOCARCINOMA WITH MUCINOUS FEATURES EXTENDING INTO PERICOLONIC ADIPOSE TISSUE AND SUBSEROSAL CONNECTIVE TISSUE. - MARGINS NOT INVOLVED. - FIFTEEN BENIGN LYMPH NODES (0/15). 3. Colon, resection margin (donut), distal anastomic ring - BENIGN COLON. - NO EVIDENCE OF MALIGNANCY.  Microscopic Comment Specimen: Sigmoid colon with liver biopsy and anastomotic rings. Procedure: Segmental resection with liver biopsy. Tumor site: Distal sigmoid. Specimen integrity: Intact. Macroscopic intactness of mesorectum: N/A Macroscopic tumor perforation: No. Invasive tumor: Maximum size: 8 cm Histologic type(s): Colorectal adenocarcinoma with mucinous features. Histologic grade and differentiation: G2: moderately differentiated/low grade Type of polyp in which invasive carcinoma arose: No residual polyp. Microscopic extension of invasive tumor: Into pericolonic adipose tissue and subserosal connective tissue. Lymph-Vascular invasion: Present. Peri-neural invasion: Present. Tumor deposit(s) (discontinuous extramural extension): No. Resection margins: Proximal margin: Free of tumor. Distal margin: Free of tumor. Circumferential (radial) (posterior ascending, posterior descending; lateral and posterior mid-rectum; and entire lower 1/3 rectum): N/A Mesenteric margin (sigmoid and transverse): Free of tumor. Distance closest margin (if all above margins negative): 2 cm from mesenteric margin. Treatment effect (neo-adjuvant therapy): No. Additional polyp(s): No. Non-neoplastic findings: None. Lymph nodes: number examined - 15; number positive: 0 Pathologic Staging: pT3, pN0, pM1 Ancillary studies: MMR pending. (JDP:gt, 04/10/15) 2. Mismatch Repair (MMR) Protein Immunohistochemistry (IHC) IHC Expression Result: MLH1: Preserved nuclear expression (greater 50% tumor expression) MSH2: Preserved nuclear expression (greater 50% tumor expression) MSH6: Preserved nuclear expression (greater  50% tumor expression) PMS2: Preserved nuclear expression (greater 50% tumor expression) * Internal control demonstrates intact nuclear expression Interpretation: NORMAL   FoundationOne test result    Surgical Pathology 07/21/2016 US Biopsy 07/21/16 Diagnosis Soft Tissue Needle Core Biopsy, upper anterior abdomen - ADENOCARCINOMA, CONSISTENT WITH COLONIC PRIMARY.  RADIOGRAPHIC STUDIES: I have personally reviewed the radiological images as listed and agreed with the findings in the report.  PET scan 09/24/2016 IMPRESSION: 1. New hypermetabolic peritoneal carcinomatosis, including numerous new hypermetabolic omental metastases throughout the abdomen and pelvis and hypermetabolic gastrohepatic ligament metastases. 2. Interval growth of intensely hypermetabolic biopsy-proven subcutaneous metastasis in the midline ventral upper abdominal wall. 3. Interval progression of hypermetabolic liver metastatic disease. 4. Trace pericardial effusion/thickening. Mild cardiomegaly. Trace dependent bilateral pleural effusions. 5. Aortic atherosclerosis.  Two-vessel coronary atherosclerosis.  DG Chest 2 View 07/22/16 IMPRESSION: 1. Regressed but not completely resolved left lung base opacity. Possible small left pleural effusion. Followup PA and lateral chest X-ray is recommended in 4 weeksto ensure resolution. 2. No new cardiopulmonary abnormality. 3. Stable cardiomegaly.  Ct chest, abdomen and pelvis W Contrast 06/11/2016 IMPRESSION: New small pleural effusions and bibasilar atelectasis, left side greater than right, of uncertain etiology. No thoracic masses or lymphadenopathy identified.  Stable left hepatic lobe metastasis. No new or progressive metastatic disease identified within the abdomen or pelvis.  Stable nodular carpal linear soft tissue density in midline ventral abdominal wall subcutaneous fat, which favors postsurgical scarring with tumor seeding of surgical tract  considered less likely. Recommend continued attention on follow-up CT.  Stable uterine fibroids, cholelithiasis, and tiny hiatal hernia.  Bone scan 03/31/2016 IMPRESSION: Negative bone scan.  ASSESSMENT & PLAN:  56 y.o. African-American female, with past medical history of hypertension, thalassemia trait, iron deficient anemia, uterine fibroids, endometriosis, who presents with intermittent rectal bleeding, mild nausea, and mild intermittent left lower quadrant abdominal pain.   1. Sigmoid rectal adenocarcinoma, with liver metastases, pT3N0M1, stage IV, MMR normal, NRAS and BRAF mutation (+)  -I previously reviewed  her surgical pathology findings with her in great details. Her liver biopsy confirmed metastatic adenocarcinoma  From colon cancer. Her primary sigmoid rectal tumor has been completely removed. -We previously reviewed her CT findings, it showed at least 4 liver metastatic lesion, with the largest one measuring 9.1 cm in the left lobe. No other distant metastasis -We previously reviewed the natural history of metastatic colon cancer, and incurable nature at this is stage, and palliative goal of therapy-Her tumor has BRAF mutation, which is a poor prognostic factor  -Her tumor has NRAS mutation, not a candidate for EGFR inhibitor  -Given the MSI-stable, she might not benefit from immunotherapy, except in a clinically trial setting  -She had a durable response to first-line chemotherapy FOLFIRI and Avastin, but unfortunately developed significant heart failure, and IV chemotherapy was held November 2017 -I have restarted her on Xeloda and Avastin in January 2018, she has been tolerating well. -I reviewed the PET scan results with the patient, unfortunately she had a significant disease progression in the liver, and developed new peritoneal carcinomatosis. -Due to the disease progression, I will switch her to FOLFOX and continue Avastin.  --Chemotherapy consent: Side effects including  but does not not limited to, fatigue, nausea, vomiting, diarrhea, hair loss, neuropathy, fluid retention, renal and kidney dysfunction, neutropenic fever, needed for blood transfusion, bleeding, coronary artery spasm and heart attack, were discussed with patient in great detail. She agrees to proceed. -Due to her recent heart failure, we may need IV Lasix with her intravenous chemotherapy -Her subcutaneous abdominal metastasis has causing a lot of pain, I recommend her to see interventional radiologist to consider ablation for better pain control.  -Her cardiologist will repeat her echo in 3 weeks.   2. CHF, with EF 20-25% -She has developed a symptomatic CHF in earlier November  2017, etiology is unclear, possible cuddy myopathy from hypertension, versus infection induced cardiomyopathy, or chemotherapy related cardiomyopathy -She is on Lasix, managed by her cardiologist, symptoms improved lately   3. Epigastric abdomen pain from the subcutaneous metastasis  -She has developed a worsening pain from the epigastric subcutaneous lesion, which has been growing lately, biopsy has confirmed metastatic lesion. -it has increased in size, and pain has been worse lately. We discussed the options of surgical resection, radiation or  cryoablation by IR for palliation. Pt is interested in ablation for pain control    4 Iron deficient Anemia, and beta thalassemia trait  -Secondary to GI bleeding and iron deficiency -She received Feraheme 510 mg twice,  responded very well. Her anemia resolved, even she was getting chemotherapy.  5. Hypertension -She has no history of hypertension. Blood pressure has been slightly elevated in the past months, repeated normal. Possibly related to Avastin -I'll continue monitoring her blood pressure closely.  -Follow up with her cardiologist  6. Peripheral neuropathy, G1 -Secondary to chemotherapy, which has been mild and stable, usually resolves before her next cycle chemo   -we'll watch closely. If gets worse, we'll consider dose reduction. -I previously recommend her to try Neurontin, she declined due to her sensitivity to sedative medication  7. Goal of care -We again discussed her goal of care is palliative, to prolong her life and improve her quality of life. We discussed the response rate to second and subsequent line chemotherapy is much less, and we likely will run out of treatment options at some point. She voiced good understanding of the goal of care is palliative, to prolong her life and palliate her symptoms. -she  agrees, but not ready to switch to palliative care alone  -she wishes to be full code for now  -We discussed a POA.    Plan -Avastin today. She will start Xeloda tomorrow, she will only take 7 days, then 7 days off. -Refill oxycodone.  -Echo in 3 weeks.  -Lab, follow-up and start FOLFOX and continue avastin in 2 weeks  All questions were answered. The patient knows to call the clinic with any problems, questions or concerns.  I spent 30 minutes counseling the patient face to face. The total time spent in the appointment was 35 minutes and more than 50% was on counseling.  This document serves as a record of services personally performed by Truitt Merle, MD. It was created on her behalf by Martinique Casey, a trained medical scribe. The creation of this record is based on the scribe's personal observations and the provider's statements to them. This document has been checked and approved by the attending provider.  I have reviewed the above documentation for accuracy and completeness and I agree with the above.    Truitt Merle, MD 09/30/2016

## 2016-09-30 ENCOUNTER — Ambulatory Visit (HOSPITAL_BASED_OUTPATIENT_CLINIC_OR_DEPARTMENT_OTHER): Payer: BLUE CROSS/BLUE SHIELD

## 2016-09-30 ENCOUNTER — Ambulatory Visit: Payer: BLUE CROSS/BLUE SHIELD

## 2016-09-30 ENCOUNTER — Ambulatory Visit (HOSPITAL_BASED_OUTPATIENT_CLINIC_OR_DEPARTMENT_OTHER): Payer: BLUE CROSS/BLUE SHIELD | Admitting: Hematology

## 2016-09-30 ENCOUNTER — Other Ambulatory Visit (HOSPITAL_BASED_OUTPATIENT_CLINIC_OR_DEPARTMENT_OTHER): Payer: BLUE CROSS/BLUE SHIELD

## 2016-09-30 ENCOUNTER — Encounter: Payer: Self-pay | Admitting: Hematology

## 2016-09-30 VITALS — BP 120/73 | HR 70 | Temp 98.6°F | Resp 18 | Ht 66.0 in | Wt 162.2 lb

## 2016-09-30 DIAGNOSIS — D509 Iron deficiency anemia, unspecified: Secondary | ICD-10-CM | POA: Diagnosis not present

## 2016-09-30 DIAGNOSIS — C189 Malignant neoplasm of colon, unspecified: Secondary | ICD-10-CM

## 2016-09-30 DIAGNOSIS — C187 Malignant neoplasm of sigmoid colon: Secondary | ICD-10-CM | POA: Diagnosis not present

## 2016-09-30 DIAGNOSIS — G620 Drug-induced polyneuropathy: Secondary | ICD-10-CM | POA: Diagnosis not present

## 2016-09-30 DIAGNOSIS — C786 Secondary malignant neoplasm of retroperitoneum and peritoneum: Secondary | ICD-10-CM

## 2016-09-30 DIAGNOSIS — C787 Secondary malignant neoplasm of liver and intrahepatic bile duct: Secondary | ICD-10-CM | POA: Diagnosis not present

## 2016-09-30 DIAGNOSIS — Z5112 Encounter for antineoplastic immunotherapy: Secondary | ICD-10-CM

## 2016-09-30 DIAGNOSIS — Z95828 Presence of other vascular implants and grafts: Secondary | ICD-10-CM

## 2016-09-30 DIAGNOSIS — D500 Iron deficiency anemia secondary to blood loss (chronic): Secondary | ICD-10-CM

## 2016-09-30 DIAGNOSIS — I10 Essential (primary) hypertension: Secondary | ICD-10-CM

## 2016-09-30 DIAGNOSIS — I420 Dilated cardiomyopathy: Secondary | ICD-10-CM

## 2016-09-30 DIAGNOSIS — D561 Beta thalassemia: Secondary | ICD-10-CM

## 2016-09-30 LAB — CBC WITH DIFFERENTIAL/PLATELET
BASO%: 0.2 % (ref 0.0–2.0)
BASOS ABS: 0 10*3/uL (ref 0.0–0.1)
EOS ABS: 0.9 10*3/uL — AB (ref 0.0–0.5)
EOS%: 16 % — ABNORMAL HIGH (ref 0.0–7.0)
HEMATOCRIT: 32.5 % — AB (ref 34.8–46.6)
HEMOGLOBIN: 9.9 g/dL — AB (ref 11.6–15.9)
LYMPH#: 1.6 10*3/uL (ref 0.9–3.3)
LYMPH%: 27.9 % (ref 14.0–49.7)
MCH: 21.6 pg — ABNORMAL LOW (ref 25.1–34.0)
MCHC: 30.5 g/dL — ABNORMAL LOW (ref 31.5–36.0)
MCV: 71 fL — AB (ref 79.5–101.0)
MONO#: 0.9 10*3/uL (ref 0.1–0.9)
MONO%: 16 % — ABNORMAL HIGH (ref 0.0–14.0)
NEUT%: 39.9 % (ref 38.4–76.8)
NEUTROS ABS: 2.3 10*3/uL (ref 1.5–6.5)
NRBC: 0 % (ref 0–0)
Platelets: 242 10*3/uL (ref 145–400)
RBC: 4.58 10*6/uL (ref 3.70–5.45)
RDW: 25.8 % — AB (ref 11.2–14.5)
WBC: 5.7 10*3/uL (ref 3.9–10.3)

## 2016-09-30 LAB — COMPREHENSIVE METABOLIC PANEL
ALBUMIN: 3.6 g/dL (ref 3.5–5.0)
ALK PHOS: 89 U/L (ref 40–150)
ALT: 6 U/L (ref 0–55)
ANION GAP: 10 meq/L (ref 3–11)
AST: 15 U/L (ref 5–34)
BILIRUBIN TOTAL: 1.36 mg/dL — AB (ref 0.20–1.20)
BUN: 25.4 mg/dL (ref 7.0–26.0)
CALCIUM: 9.8 mg/dL (ref 8.4–10.4)
CO2: 26 meq/L (ref 22–29)
CREATININE: 1.5 mg/dL — AB (ref 0.6–1.1)
Chloride: 103 mEq/L (ref 98–109)
EGFR: 46 mL/min/{1.73_m2} — AB (ref >90)
Glucose: 106 mg/dl (ref 70–140)
Potassium: 4.3 mEq/L (ref 3.5–5.1)
Sodium: 139 mEq/L (ref 136–145)
TOTAL PROTEIN: 7.2 g/dL (ref 6.4–8.3)

## 2016-09-30 LAB — UA PROTEIN, DIPSTICK - CHCC: Protein, ur: NEGATIVE mg/dL

## 2016-09-30 LAB — CEA (IN HOUSE-CHCC): CEA (CHCC-IN HOUSE): 229.65 ng/mL — AB (ref 0.00–5.00)

## 2016-09-30 MED ORDER — SODIUM CHLORIDE 0.9 % IJ SOLN
10.0000 mL | INTRAMUSCULAR | Status: DC | PRN
  Administered 2016-09-30: 10 mL
  Filled 2016-09-30: qty 10

## 2016-09-30 MED ORDER — SODIUM CHLORIDE 0.9 % IV SOLN
Freq: Once | INTRAVENOUS | Status: AC
  Administered 2016-09-30: 15:00:00 via INTRAVENOUS

## 2016-09-30 MED ORDER — SODIUM CHLORIDE 0.9 % IV SOLN
5.5000 mg/kg | Freq: Once | INTRAVENOUS | Status: AC
  Administered 2016-09-30: 400 mg via INTRAVENOUS
  Filled 2016-09-30: qty 16

## 2016-09-30 MED ORDER — OXYCODONE HCL 5 MG PO TABS: 5.0000 mg | ORAL_TABLET | Freq: Four times a day (QID) | ORAL | 0 refills | Status: DC | PRN

## 2016-09-30 MED ORDER — SODIUM CHLORIDE 0.9 % IJ SOLN
10.0000 mL | INTRAMUSCULAR | Status: DC | PRN
  Administered 2016-09-30: 10 mL via INTRAVENOUS
  Filled 2016-09-30: qty 10

## 2016-09-30 MED ORDER — FUROSEMIDE 20 MG PO TABS: 20.0000 mg | ORAL_TABLET | Freq: Once | ORAL | Status: DC

## 2016-09-30 MED ORDER — HEPARIN SOD (PORK) LOCK FLUSH 100 UNIT/ML IV SOLN
500.0000 [IU] | Freq: Once | INTRAVENOUS | Status: AC | PRN
  Administered 2016-09-30: 500 [IU]
  Filled 2016-09-30: qty 5

## 2016-09-30 NOTE — Patient Instructions (Signed)
[facility] Discharge Instructions for Patients Receiving Chemotherapy  Today you received the following chemotherapy agents Avastin To help prevent nausea and vomiting after your treatment, we encourage you to take your nausea medication as prescribed.   If you develop nausea and vomiting that is not controlled by your nausea medication, call the clinic.   BELOW ARE SYMPTOMS THAT SHOULD BE REPORTED IMMEDIATELY:  *FEVER GREATER THAN 100.5 F  *CHILLS WITH OR WITHOUT FEVER  NAUSEA AND VOMITING THAT IS NOT CONTROLLED WITH YOUR NAUSEA MEDICATION  *UNUSUAL SHORTNESS OF BREATH  *UNUSUAL BRUISING OR BLEEDING  TENDERNESS IN MOUTH AND THROAT WITH OR WITHOUT PRESENCE OF ULCERS  *URINARY PROBLEMS  *BOWEL PROBLEMS  UNUSUAL RASH Items with * indicate a potential emergency and should be followed up as soon as possible.  Feel free to call the clinic you have any questions or concerns. The clinic phone number is [phone].  Please show the CHEMO ALERT CARD at check-in to the Emergency Department and triage nurse.   

## 2016-09-30 NOTE — Patient Instructions (Signed)

## 2016-09-30 NOTE — Progress Notes (Signed)
START ON PATHWAY REGIMEN - Colorectal     A cycle is every 14 days:     Oxaliplatin        Dose Mod: None     Leucovorin        Dose Mod: None     5-Fluorouracil        Dose Mod: None     5-Fluorouracil        Dose Mod: None     Bevacizumab        Dose Mod: None  **Always confirm dose/schedule in your pharmacy ordering system**    Patient Characteristics: Metastatic Colorectal, Second Line, KRAS Mutation Positive/Unknown, BRAF Mutation Positive, Bevacizumab Eligible Current evidence of distant metastases? Yes AJCC T Category: TX AJCC N Category: NX AJCC M Category: Staged < 8th Ed. AJCC 8 Stage Grouping: IVC BRAF Mutation Status: Mutation Positive KRAS/NRAS Mutation Status: Mutation Positive Line of therapy: Second Line Would you be surprised if this patient died  in the next year? I would NOT be surprised if this patient died in the next year  Intent of Therapy: Non-Curative / Palliative Intent, Discussed with Patient

## 2016-10-01 ENCOUNTER — Telehealth: Payer: Self-pay | Admitting: Hematology

## 2016-10-01 NOTE — Telephone Encounter (Signed)
sw pt to confirm 3/8 appts per LOS

## 2016-10-04 ENCOUNTER — Telehealth: Payer: Self-pay | Admitting: Oncology

## 2016-10-04 NOTE — Telephone Encounter (Signed)
sw pt to confirm 3/5 appt at 330 with [facility] per LOS

## 2016-10-11 ENCOUNTER — Other Ambulatory Visit: Payer: Self-pay | Admitting: *Deleted

## 2016-10-11 ENCOUNTER — Ambulatory Visit (HOSPITAL_BASED_OUTPATIENT_CLINIC_OR_DEPARTMENT_OTHER): Payer: BLUE CROSS/BLUE SHIELD | Admitting: Oncology

## 2016-10-11 ENCOUNTER — Telehealth: Payer: Self-pay | Admitting: Oncology

## 2016-10-11 VITALS — BP 167/99 | HR 83 | Temp 98.3°F | Resp 18 | Ht 66.0 in | Wt 163.6 lb

## 2016-10-11 DIAGNOSIS — C187 Malignant neoplasm of sigmoid colon: Secondary | ICD-10-CM | POA: Diagnosis not present

## 2016-10-11 DIAGNOSIS — D509 Iron deficiency anemia, unspecified: Secondary | ICD-10-CM

## 2016-10-11 DIAGNOSIS — C787 Secondary malignant neoplasm of liver and intrahepatic bile duct: Secondary | ICD-10-CM | POA: Diagnosis not present

## 2016-10-11 DIAGNOSIS — C786 Secondary malignant neoplasm of retroperitoneum and peritoneum: Secondary | ICD-10-CM

## 2016-10-11 DIAGNOSIS — C189 Malignant neoplasm of colon, unspecified: Secondary | ICD-10-CM

## 2016-10-11 DIAGNOSIS — D561 Beta thalassemia: Secondary | ICD-10-CM | POA: Diagnosis not present

## 2016-10-11 DIAGNOSIS — I10 Essential (primary) hypertension: Secondary | ICD-10-CM | POA: Diagnosis not present

## 2016-10-11 MED ORDER — PROCHLORPERAZINE MALEATE 10 MG PO TABS: 10.0000 mg | ORAL_TABLET | Freq: Four times a day (QID) | ORAL | 1 refills | Status: AC | PRN

## 2016-10-11 NOTE — Progress Notes (Signed)
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 She has been under the care of Dr.Feng. She was most recently treated with Maintenance Xeloda/Avastin therapy. A CT 09/24/2016 revealed new hypermetabolic peritoneal nodules with interval growth of a subcutaneous metastasis in the ventral midline. There was also interval progression of liver metastases.  Dr. Burr Medico recommended treatment with FOLFOX/Avastin when she saw her for follow-up on 09/30/2016.   Dr.Neale decided to transfer her medical oncology care to myself. She is here to establish care. He has been scheduled for a first cycle of FOLFOX/Avastin on 10/14/2016.   Review of systems: Positives-pain associated with the upper abdominal wall mass, pain at the soles of the feet while taking Xeloda, hyperpigmentation of the hands, face, and feet, intermittent "sweats ". A complete review of systems was otherwise negative   Objective:  Vital signs in last 24 hours:  Blood pressure (!) 167/99, pulse 83, temperature 98.3 F (36.8 C), temperature source Oral, resp. rate 18, height 5' 6"  (1.676 m), weight 163 lb 9.6 oz (74.2 kg), SpO2 99 %.    HEENT: Hyperpigmentation of the buccal mucosa, no thrush or ulcers Resp: Lungs clear bilaterally Cardio: Regular rate and rhythm GI: No hepatosplenomegaly, no apparent ascites, upper abdominal wall mass measures 5 cm in horizontal and 3.5 cm in vertical dimension Vascular: No leg edema  Skin: Hyperpigmentation of the hands and feet, no skin breakdown   Portacath/PICC-without erythema  Lab Results:  Lab Results  Component Value Date   WBC 5.7 09/30/2016   HGB 9.9 (L) 09/30/2016   HCT 32.5 (L) 09/30/2016   MCV 71.0 (L) 09/30/2016   PLT 242 09/30/2016   NEUTROABS 2.3 09/30/2016   CEA on 09/30/2016-229.65   Imaging: PET images from  09/23/2016-reviewed   Medications: I have reviewed the patient's current medications.  Assessment/Plan:  1. Metastatic colon cancer  Colonoscopy confirmed a sigmoid mass 04/03/2015 with biopsy confirming adenocarcinoma  CTs 04/06/2015 confirmed multiple liver metastases  Foundation 1 testing revealed NRAS G60E and BRAF dictations   Laparoscopic sigmoid colectomy, liver biopsy, and Port-A-Cath insertion 04/08/2015  Sigmoid colon tumor-T3 N0, liver biopsy confirmed metastatic adenocarcinoma  Chemotherapy initiated with FOLFIRI 05/08/2015, Avastin added with cycle 2  CT 07/14/2015-improvement in liver metastases  CT 10/12/2015-continued improvement in liver metastases  CT 06/11/2016-new small pleural effusions, stable hepatic metastases, stable soft tissue density at the ventral midline abdominal wall  Admission 06/16/2016 through 06/20/2016 with congestive heart failure and pneumonia  07/21/2016-biopsy of abdominal wall mass-positive for adenocarcinoma  08/12/2016-chemotherapy switch to Xeloda 2 weeks on/one-week off and Avastin every 2 weeks  09/24/2016-PET scan with new hypermetabolic omental nodules and a new hypermetabolic gastrohepatic ligament node, growth of abdominal wall mass and progression of liver metastases  Treatment switch to FOLFOX/Avastin beginning 10/14/2016  2.  Congestive heart failure diagnosed in November 2017-unclear, infection related cardiomyopathy  3.   Hypertension  4.  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   I reviewed the treatment history with her. We discussed current treatment options. There is clinical and imaging evidence of progressive metastatic disease while on treatment with Xeloda/Avastin.  We discussed resuming FOLFIRI/Avastin versus switching to oxaliplatin-based therapy. I agree with the plan by Dr. Burr Medico to make a change to oxaliplatin  therapy. We discussed FOLFOX and CAPOX. The plan is to proceed with  FOLFOX/Avastin beginning on 10/14/2016.  I reviewed the potential toxicities associated with oxaliplatin including the chance of allergic reaction and the various types of neuropathy seen with oxaliplatin. She agrees to proceed.  We discussed the potential etiologies of her congestive heart failure. The systemic therapy agents she received prior to being diagnosed with CHF are not typically associated with heart failure. The heart failure may be related to a viral infection or hypertension.  She will return for an office visit and the second cycle of FOLFOX/Avastin on 10/28/2016.    40 minutes were spent with the patient today. The majority of the time was used for counseling and coordination of care. Betsy Coder, MD  10/11/2016  4:21 PM

## 2016-10-11 NOTE — Telephone Encounter (Signed)
Gave patient avs report and appointments for March. Per 3/5 los from [facility] cx 3/8 f/u with YF.

## 2016-10-13 ENCOUNTER — Other Ambulatory Visit: Payer: Self-pay | Admitting: *Deleted

## 2016-10-14 ENCOUNTER — Other Ambulatory Visit (HOSPITAL_BASED_OUTPATIENT_CLINIC_OR_DEPARTMENT_OTHER): Payer: BLUE CROSS/BLUE SHIELD

## 2016-10-14 ENCOUNTER — Ambulatory Visit (HOSPITAL_BASED_OUTPATIENT_CLINIC_OR_DEPARTMENT_OTHER): Payer: BLUE CROSS/BLUE SHIELD

## 2016-10-14 ENCOUNTER — Ambulatory Visit: Payer: BLUE CROSS/BLUE SHIELD

## 2016-10-14 ENCOUNTER — Ambulatory Visit: Payer: Self-pay | Admitting: Hematology

## 2016-10-14 VITALS — BP 154/84 | HR 72 | Temp 98.5°F | Resp 18

## 2016-10-14 DIAGNOSIS — Z5112 Encounter for antineoplastic immunotherapy: Secondary | ICD-10-CM | POA: Diagnosis not present

## 2016-10-14 DIAGNOSIS — C187 Malignant neoplasm of sigmoid colon: Secondary | ICD-10-CM

## 2016-10-14 DIAGNOSIS — C189 Malignant neoplasm of colon, unspecified: Secondary | ICD-10-CM

## 2016-10-14 DIAGNOSIS — C787 Secondary malignant neoplasm of liver and intrahepatic bile duct: Secondary | ICD-10-CM | POA: Diagnosis not present

## 2016-10-14 DIAGNOSIS — Z5111 Encounter for antineoplastic chemotherapy: Secondary | ICD-10-CM | POA: Diagnosis not present

## 2016-10-14 LAB — COMPREHENSIVE METABOLIC PANEL
ALT: <6 U/L (ref 0–55)
AST: 19 U/L (ref 5–34)
Albumin: 3.5 g/dL (ref 3.5–5.0)
Alkaline Phosphatase: 72 U/L (ref 40–150)
Anion Gap: 8 mEq/L (ref 3–11)
BUN: 12.2 mg/dL (ref 7.0–26.0)
CO2: 26 meq/L (ref 22–29)
Calcium: 9.7 mg/dL (ref 8.4–10.4)
Chloride: 105 mEq/L (ref 98–109)
Creatinine: 0.8 mg/dL (ref 0.6–1.1)
EGFR: 90 mL/min/{1.73_m2} — AB (ref >90)
GLUCOSE: 121 mg/dL (ref 70–140)
POTASSIUM: 5.1 meq/L (ref 3.5–5.1)
SODIUM: 138 meq/L (ref 136–145)
TOTAL PROTEIN: 7 g/dL (ref 6.4–8.3)
Total Bilirubin: 1.13 mg/dL (ref 0.20–1.20)

## 2016-10-14 LAB — CBC WITH DIFFERENTIAL/PLATELET
BASO%: 0.2 % (ref 0.0–2.0)
BASOS ABS: 0 10*3/uL (ref 0.0–0.1)
EOS%: 17.2 % — AB (ref 0.0–7.0)
Eosinophils Absolute: 1.1 10*3/uL — ABNORMAL HIGH (ref 0.0–0.5)
HCT: 38.1 % (ref 34.8–46.6)
HGB: 11.6 g/dL (ref 11.6–15.9)
LYMPH%: 24.5 % (ref 14.0–49.7)
MCH: 22.2 pg — ABNORMAL LOW (ref 25.1–34.0)
MCHC: 30.4 g/dL — ABNORMAL LOW (ref 31.5–36.0)
MCV: 73 fL — AB (ref 79.5–101.0)
MONO#: 0.9 10*3/uL (ref 0.1–0.9)
MONO%: 14.4 % — AB (ref 0.0–14.0)
NEUT#: 2.9 10*3/uL (ref 1.5–6.5)
NEUT%: 43.7 % (ref 38.4–76.8)
NRBC: 0 % (ref 0–0)
Platelets: 297 10*3/uL (ref 145–400)
RBC: 5.22 10*6/uL (ref 3.70–5.45)
RDW: 28 % — AB (ref 11.2–14.5)
WBC: 6.5 10*3/uL (ref 3.9–10.3)
lymph#: 1.6 10*3/uL (ref 0.9–3.3)

## 2016-10-14 LAB — CEA (IN HOUSE-CHCC): CEA (CHCC-In House): 255.14 ng/mL — ABNORMAL HIGH (ref 0.00–5.00)

## 2016-10-14 MED ORDER — DEXTROSE 5 % IV SOLN
Freq: Once | INTRAVENOUS | Status: AC
  Administered 2016-10-14: 13:00:00 via INTRAVENOUS

## 2016-10-14 MED ORDER — DEXAMETHASONE SODIUM PHOSPHATE 10 MG/ML IJ SOLN
10.0000 mg | Freq: Once | INTRAMUSCULAR | Status: AC
  Administered 2016-10-14: 10 mg via INTRAVENOUS

## 2016-10-14 MED ORDER — PALONOSETRON HCL INJECTION 0.25 MG/5ML
0.2500 mg | Freq: Once | INTRAVENOUS | Status: AC
  Administered 2016-10-14: 0.25 mg via INTRAVENOUS

## 2016-10-14 MED ORDER — SODIUM CHLORIDE 0.9 % IV SOLN
2400.0000 mg/m2 | INTRAVENOUS | Status: DC
  Administered 2016-10-14: 4450 mg via INTRAVENOUS
  Filled 2016-10-14: qty 89

## 2016-10-14 MED ORDER — DEXAMETHASONE SODIUM PHOSPHATE 10 MG/ML IJ SOLN
INTRAMUSCULAR | Status: AC
  Filled 2016-10-14: qty 1

## 2016-10-14 MED ORDER — PALONOSETRON HCL INJECTION 0.25 MG/5ML
INTRAVENOUS | Status: AC
  Filled 2016-10-14: qty 5

## 2016-10-14 MED ORDER — DEXTROSE 5 % IV SOLN
82.0000 mg/m2 | Freq: Once | INTRAVENOUS | Status: AC
  Administered 2016-10-14: 150 mg via INTRAVENOUS
  Filled 2016-10-14: qty 20

## 2016-10-14 MED ORDER — SODIUM CHLORIDE 0.9 % IV SOLN
5.5000 mg/kg | Freq: Once | INTRAVENOUS | Status: AC
  Administered 2016-10-14: 400 mg via INTRAVENOUS
  Filled 2016-10-14: qty 16

## 2016-10-14 MED ORDER — LEUCOVORIN CALCIUM INJECTION 350 MG
400.0000 mg/m2 | Freq: Once | INTRAVENOUS | Status: AC
  Administered 2016-10-14: 740 mg via INTRAVENOUS
  Filled 2016-10-14: qty 37

## 2016-10-14 MED ORDER — SODIUM CHLORIDE 0.9 % IV SOLN
Freq: Once | INTRAVENOUS | Status: AC
  Administered 2016-10-14: 12:00:00 via INTRAVENOUS

## 2016-10-14 MED ORDER — SODIUM CHLORIDE 0.9 % IV SOLN: 10.0000 mg | Freq: Once | INTRAVENOUS | Status: DC

## 2016-10-14 NOTE — Patient Instructions (Signed)
[facility] Discharge Instructions for Patients Receiving Chemotherapy  Today you received the following chemotherapy agents : Oxaliplatin, Leucovorin, Fluorouracil, Avastin,  To help prevent nausea and vomiting after your treatment, we encourage you to take your nausea medication as prescribed.   If you develop nausea and vomiting that is not controlled by your nausea medication, call the clinic.   BELOW ARE SYMPTOMS THAT SHOULD BE REPORTED IMMEDIATELY:  *FEVER GREATER THAN 100.5 F  *CHILLS WITH OR WITHOUT FEVER  NAUSEA AND VOMITING THAT IS NOT CONTROLLED WITH YOUR NAUSEA MEDICATION  *UNUSUAL SHORTNESS OF BREATH  *UNUSUAL BRUISING OR BLEEDING  TENDERNESS IN MOUTH AND THROAT WITH OR WITHOUT PRESENCE OF ULCERS  *URINARY PROBLEMS  *BOWEL PROBLEMS  UNUSUAL RASH Items with * indicate a potential emergency and should be followed up as soon as possible.  Feel free to call the clinic you have any questions or concerns. The clinic phone number is [phone].  Please show the [place] at check-in to the Emergency Department and triage nurse.

## 2016-10-14 NOTE — Progress Notes (Signed)
Pt declined Lasix po while in infusion room.  Stated she took Lasix 40 mg from home at 1018 am today prior to chemo.  Pt stated she would take another Lasix 40 mg at home this evening.

## 2016-10-16 ENCOUNTER — Ambulatory Visit (HOSPITAL_BASED_OUTPATIENT_CLINIC_OR_DEPARTMENT_OTHER): Payer: Self-pay

## 2016-10-16 VITALS — BP 150/84 | HR 62 | Temp 98.5°F | Resp 18

## 2016-10-16 DIAGNOSIS — C787 Secondary malignant neoplasm of liver and intrahepatic bile duct: Secondary | ICD-10-CM

## 2016-10-16 DIAGNOSIS — C189 Malignant neoplasm of colon, unspecified: Secondary | ICD-10-CM

## 2016-10-16 MED ORDER — SODIUM CHLORIDE 0.9% FLUSH
10.0000 mL | INTRAVENOUS | Status: DC | PRN
  Administered 2016-10-16: 10 mL
  Filled 2016-10-16: qty 10

## 2016-10-16 MED ORDER — HEPARIN SOD (PORK) LOCK FLUSH 100 UNIT/ML IV SOLN
500.0000 [IU] | Freq: Once | INTRAVENOUS | Status: AC | PRN
  Administered 2016-10-16: 500 [IU]
  Filled 2016-10-16: qty 5

## 2016-10-19 ENCOUNTER — Telehealth: Payer: Self-pay | Admitting: *Deleted

## 2016-10-19 NOTE — Telephone Encounter (Signed)
Called pt, she denies any issued after first FOLFOX treatment. She was able to work all weekend after pump disconnect.  Reports mild nausea today, managed with Zofran. Denied any increased edema. Pt denied any questions, stated she knows how to reach [place] for issues/ side effect management.

## 2016-10-19 NOTE — Telephone Encounter (Signed)
-----   Message from Jarvis Morgan, RN sent at 10/14/2016  4:29 PM EST ----- Regarding: Chemo follow up Dr. [name],  First time  Oxaliplatin.  Had FIRI, Avastin before,  TB, RN.

## 2016-10-28 ENCOUNTER — Ambulatory Visit (HOSPITAL_BASED_OUTPATIENT_CLINIC_OR_DEPARTMENT_OTHER): Payer: BLUE CROSS/BLUE SHIELD

## 2016-10-28 ENCOUNTER — Other Ambulatory Visit (HOSPITAL_BASED_OUTPATIENT_CLINIC_OR_DEPARTMENT_OTHER): Payer: BLUE CROSS/BLUE SHIELD

## 2016-10-28 ENCOUNTER — Other Ambulatory Visit: Payer: Self-pay | Admitting: *Deleted

## 2016-10-28 ENCOUNTER — Ambulatory Visit (HOSPITAL_BASED_OUTPATIENT_CLINIC_OR_DEPARTMENT_OTHER): Payer: BLUE CROSS/BLUE SHIELD | Admitting: Oncology

## 2016-10-28 VITALS — BP 144/82 | HR 73 | Temp 98.3°F | Resp 18 | Wt 163.2 lb

## 2016-10-28 DIAGNOSIS — C189 Malignant neoplasm of colon, unspecified: Secondary | ICD-10-CM

## 2016-10-28 DIAGNOSIS — D509 Iron deficiency anemia, unspecified: Secondary | ICD-10-CM | POA: Diagnosis not present

## 2016-10-28 DIAGNOSIS — Z5111 Encounter for antineoplastic chemotherapy: Secondary | ICD-10-CM

## 2016-10-28 DIAGNOSIS — I10 Essential (primary) hypertension: Secondary | ICD-10-CM

## 2016-10-28 DIAGNOSIS — C787 Secondary malignant neoplasm of liver and intrahepatic bile duct: Secondary | ICD-10-CM | POA: Diagnosis not present

## 2016-10-28 DIAGNOSIS — C187 Malignant neoplasm of sigmoid colon: Secondary | ICD-10-CM | POA: Diagnosis not present

## 2016-10-28 DIAGNOSIS — D561 Beta thalassemia: Secondary | ICD-10-CM | POA: Diagnosis not present

## 2016-10-28 DIAGNOSIS — Z5112 Encounter for antineoplastic immunotherapy: Secondary | ICD-10-CM

## 2016-10-28 DIAGNOSIS — K1230 Oral mucositis (ulcerative), unspecified: Secondary | ICD-10-CM

## 2016-10-28 LAB — CBC WITH DIFFERENTIAL/PLATELET
BASO%: 0.4 % (ref 0.0–2.0)
BASOS ABS: 0 10*3/uL (ref 0.0–0.1)
EOS%: 14.6 % — AB (ref 0.0–7.0)
Eosinophils Absolute: 0.9 10*3/uL — ABNORMAL HIGH (ref 0.0–0.5)
HEMATOCRIT: 37.1 % (ref 34.8–46.6)
HGB: 11.8 g/dL (ref 11.6–15.9)
LYMPH#: 1.4 10*3/uL (ref 0.9–3.3)
LYMPH%: 21.7 % (ref 14.0–49.7)
MCH: 23.1 pg — ABNORMAL LOW (ref 25.1–34.0)
MCHC: 31.8 g/dL (ref 31.5–36.0)
MCV: 72.6 fL — ABNORMAL LOW (ref 79.5–101.0)
MONO#: 1 10*3/uL — ABNORMAL HIGH (ref 0.1–0.9)
MONO%: 14.9 % — ABNORMAL HIGH (ref 0.0–14.0)
NEUT#: 3.1 10*3/uL (ref 1.5–6.5)
NEUT%: 48.4 % (ref 38.4–76.8)
Platelets: 246 10*3/uL (ref 145–400)
RBC: 5.1 10*6/uL (ref 3.70–5.45)
RDW: 31 % — ABNORMAL HIGH (ref 11.2–14.5)
WBC: 6.4 10*3/uL (ref 3.9–10.3)

## 2016-10-28 LAB — COMPREHENSIVE METABOLIC PANEL
ALT: 10 U/L (ref 0–55)
ANION GAP: 12 meq/L — AB (ref 3–11)
AST: 23 U/L (ref 5–34)
Albumin: 3.9 g/dL (ref 3.5–5.0)
Alkaline Phosphatase: 79 U/L (ref 40–150)
BUN: 16.4 mg/dL (ref 7.0–26.0)
CALCIUM: 10.6 mg/dL — AB (ref 8.4–10.4)
CHLORIDE: 100 meq/L (ref 98–109)
CO2: 28 mEq/L (ref 22–29)
Creatinine: 1.1 mg/dL (ref 0.6–1.1)
EGFR: 67 mL/min/{1.73_m2} — ABNORMAL LOW (ref >90)
Glucose: 101 mg/dl (ref 70–140)
POTASSIUM: 4.3 meq/L (ref 3.5–5.1)
Sodium: 141 mEq/L (ref 136–145)
Total Bilirubin: 1.23 mg/dL — ABNORMAL HIGH (ref 0.20–1.20)
Total Protein: 7.9 g/dL (ref 6.4–8.3)

## 2016-10-28 LAB — CEA (IN HOUSE-CHCC): CEA (CHCC-IN HOUSE): 216.33 ng/mL — AB (ref 0.00–5.00)

## 2016-10-28 LAB — UA PROTEIN, DIPSTICK - CHCC: Protein, ur: NEGATIVE mg/dL

## 2016-10-28 MED ORDER — PALONOSETRON HCL INJECTION 0.25 MG/5ML
INTRAVENOUS | Status: AC
  Filled 2016-10-28: qty 5

## 2016-10-28 MED ORDER — MAGIC MOUTHWASH: ORAL | 0 refills | Status: AC

## 2016-10-28 MED ORDER — DEXTROSE 5 % IV SOLN
Freq: Once | INTRAVENOUS | Status: AC
  Administered 2016-10-28: 10:00:00 via INTRAVENOUS

## 2016-10-28 MED ORDER — FUROSEMIDE 20 MG PO TABS
ORAL_TABLET | ORAL | Status: AC
  Filled 2016-10-28: qty 1

## 2016-10-28 MED ORDER — SODIUM CHLORIDE 0.9 % IV SOLN
Freq: Once | INTRAVENOUS | Status: AC
  Administered 2016-10-28: 10:00:00 via INTRAVENOUS

## 2016-10-28 MED ORDER — OXALIPLATIN CHEMO INJECTION 100 MG/20ML
82.0000 mg/m2 | Freq: Once | INTRAVENOUS | Status: AC
  Administered 2016-10-28: 150 mg via INTRAVENOUS
  Filled 2016-10-28: qty 10

## 2016-10-28 MED ORDER — DEXAMETHASONE SODIUM PHOSPHATE 10 MG/ML IJ SOLN
10.0000 mg | Freq: Once | INTRAMUSCULAR | Status: AC
  Administered 2016-10-28: 10 mg via INTRAVENOUS

## 2016-10-28 MED ORDER — PALONOSETRON HCL INJECTION 0.25 MG/5ML
0.2500 mg | Freq: Once | INTRAVENOUS | Status: AC
  Administered 2016-10-28: 0.25 mg via INTRAVENOUS

## 2016-10-28 MED ORDER — DEXAMETHASONE SODIUM PHOSPHATE 10 MG/ML IJ SOLN
INTRAMUSCULAR | Status: AC
  Filled 2016-10-28: qty 1

## 2016-10-28 MED ORDER — SODIUM CHLORIDE 0.9 % IV SOLN
5.5000 mg/kg | Freq: Once | INTRAVENOUS | Status: AC
  Administered 2016-10-28: 400 mg via INTRAVENOUS
  Filled 2016-10-28: qty 16

## 2016-10-28 MED ORDER — FUROSEMIDE 20 MG PO TABS: 20.0000 mg | ORAL_TABLET | Freq: Once | ORAL | Status: DC

## 2016-10-28 MED ORDER — LEUCOVORIN CALCIUM INJECTION 350 MG
400.0000 mg/m2 | Freq: Once | INTRAVENOUS | Status: AC
  Administered 2016-10-28: 740 mg via INTRAVENOUS
  Filled 2016-10-28: qty 37

## 2016-10-28 MED ORDER — SODIUM CHLORIDE 0.9 % IV SOLN
2400.0000 mg/m2 | INTRAVENOUS | Status: DC
  Administered 2016-10-28: 4450 mg via INTRAVENOUS
  Filled 2016-10-28: qty 89

## 2016-10-28 MED ORDER — OXYCODONE HCL 5 MG PO TABS: 5.0000 mg | ORAL_TABLET | Freq: Four times a day (QID) | ORAL | 0 refills | Status: DC | PRN

## 2016-10-28 NOTE — Patient Instructions (Signed)
[facility] Discharge Instructions for Patients Receiving Chemotherapy  Today you received the following chemotherapy agents:  Avastin, Leucovorin, Oxaliplatin, Fluorouracil  To help prevent nausea and vomiting after your treatment, we encourage you to take your nausea medication as prescribed.   If you develop nausea and vomiting that is not controlled by your nausea medication, call the clinic.   BELOW ARE SYMPTOMS THAT SHOULD BE REPORTED IMMEDIATELY:  *FEVER GREATER THAN 100.5 F  *CHILLS WITH OR WITHOUT FEVER  NAUSEA AND VOMITING THAT IS NOT CONTROLLED WITH YOUR NAUSEA MEDICATION  *UNUSUAL SHORTNESS OF BREATH  *UNUSUAL BRUISING OR BLEEDING  TENDERNESS IN MOUTH AND THROAT WITH OR WITHOUT PRESENCE OF ULCERS  *URINARY PROBLEMS  *BOWEL PROBLEMS  UNUSUAL RASH Items with * indicate a potential emergency and should be followed up as soon as possible.  Feel free to call the clinic you have any questions or concerns. The clinic phone number is [phone].  Please show the [place] at check-in to the Emergency Department and triage nurse.

## 2016-10-28 NOTE — Progress Notes (Signed)
  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 No nausea/vomiting. She had mouth sores lasting a few days. She reports warm sensitivity following chemotherapy. No neuropathy symptoms at present. She complains of "tightness "of the fingers. No diarrhea. She has bloating in the upper abdomen. The abdominal wall mass is smaller and the associated pain has improved. No bleeding or symptom of thrombosis.  Objective:  Vital signs in last 24 hours:  Blood pressure (!) 144/82, pulse 73, temperature 98.3 F (36.8 C), temperature source Oral, resp. rate 18, weight 163 lb 3.2 oz (74 kg), SpO2 100 %.    HEENT: Hyperpigmentation of the mouth, no thrush or ulcers Resp: Lungs clear bilaterally Cardio: Regular rate and rhythm, 2/6 diastolic murmur GI: No hepatosplenomegaly, mobile upper midline mass Vascular: No leg edema  Skin: Hyperpigmentation with thickening and dryness of the palms and soles.   Portacath/PICC-without erythema  Lab Results:  Lab Results  Component Value Date   WBC 6.5 10/14/2016   HGB 11.6 10/14/2016   HCT 38.1 10/14/2016   MCV 73.0 (L) 10/14/2016   PLT 297 10/14/2016   NEUTROABS 2.9 10/14/2016     Medications: I have reviewed the patient's current medications.  Assessment/Plan: 1. Metastatic colon cancer  Colonoscopy confirmed a sigmoid mass 04/03/2015 with biopsy confirming adenocarcinoma  CTs 04/06/2015 confirmed multiple liver metastases  Foundation 1 testing revealed NRAS G60E and BRAF [id number] mutations   Laparoscopic sigmoid colectomy, liver biopsy, and Port-A-Cath insertion 04/08/2015  Sigmoid colon tumor-T3 N0, liver biopsy confirmed metastatic adenocarcinoma  Chemotherapy initiated with FOLFIRI 05/08/2015, Avastin added with cycle 2  CT 07/14/2015-improvement in liver metastases  CT 10/12/2015-continued improvement in liver  metastases  CT 06/11/2016-new small pleural effusions, stable hepatic metastases, stable soft tissue density at the ventral midline abdominal wall  Admission 06/16/2016 through 06/20/2016 with congestive heart failure and pneumonia  07/21/2016-biopsy of abdominal wall mass-positive for adenocarcinoma  08/12/2016-chemotherapy switch to Xeloda 2 weeks on/one-week off and Avastin every 2 weeks  09/24/2016-PET scan with new hypermetabolic omental nodules and a new hypermetabolic gastrohepatic ligament node, growth of abdominal wall mass and progression of liver metastases  Treatment switch to FOLFOX/Avastin beginning 10/14/2016  2.  Congestive heart failure diagnosed in November 2017-unclear, infection related cardiomyopathy  3.   Hypertension  4.  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 The plan is to proceed with cycle 2 FOLFOX/Avastin today. We prescribed Magic mouthwash to use for the oral mucositis. She will return for an office visit and chemotherapy in 2 weeks. We will check the CEA when she returns in 2 weeks. She has significant hyperpigmentation and thickening at the hands and feet. We will dose reduce the 5-fluorouracil if this becomes more symptomatic.      Betsy Coder, MD  10/28/2016  8:30 AM

## 2016-10-28 NOTE — Addendum Note (Signed)
Addended by: Betsy Coder B on: 10/28/2016 10:10 AM   Modules accepted: Orders

## 2016-10-30 ENCOUNTER — Ambulatory Visit (HOSPITAL_BASED_OUTPATIENT_CLINIC_OR_DEPARTMENT_OTHER): Payer: BLUE CROSS/BLUE SHIELD

## 2016-10-30 VITALS — BP 108/84 | HR 73 | Temp 98.5°F | Resp 17

## 2016-10-30 DIAGNOSIS — Z452 Encounter for adjustment and management of vascular access device: Secondary | ICD-10-CM | POA: Diagnosis not present

## 2016-10-30 DIAGNOSIS — C187 Malignant neoplasm of sigmoid colon: Secondary | ICD-10-CM | POA: Diagnosis not present

## 2016-10-30 DIAGNOSIS — C787 Secondary malignant neoplasm of liver and intrahepatic bile duct: Principal | ICD-10-CM

## 2016-10-30 DIAGNOSIS — C189 Malignant neoplasm of colon, unspecified: Secondary | ICD-10-CM

## 2016-10-30 MED ORDER — HEPARIN SOD (PORK) LOCK FLUSH 100 UNIT/ML IV SOLN
500.0000 [IU] | Freq: Once | INTRAVENOUS | Status: AC | PRN
  Administered 2016-10-30: 500 [IU]
  Filled 2016-10-30: qty 5

## 2016-10-30 MED ORDER — SODIUM CHLORIDE 0.9% FLUSH
10.0000 mL | INTRAVENOUS | Status: DC | PRN
  Administered 2016-10-30: 10 mL
  Filled 2016-10-30: qty 10

## 2016-11-05 ENCOUNTER — Telehealth: Payer: Self-pay | Admitting: Oncology

## 2016-11-05 NOTE — Telephone Encounter (Signed)
Spoke with patient re next appointment 4/5. Patient will get new schedule 4/5.

## 2016-11-11 ENCOUNTER — Other Ambulatory Visit (HOSPITAL_BASED_OUTPATIENT_CLINIC_OR_DEPARTMENT_OTHER): Payer: BLUE CROSS/BLUE SHIELD

## 2016-11-11 ENCOUNTER — Ambulatory Visit: Payer: BLUE CROSS/BLUE SHIELD

## 2016-11-11 ENCOUNTER — Telehealth: Payer: Self-pay | Admitting: Oncology

## 2016-11-11 ENCOUNTER — Ambulatory Visit (HOSPITAL_BASED_OUTPATIENT_CLINIC_OR_DEPARTMENT_OTHER): Payer: BLUE CROSS/BLUE SHIELD | Admitting: Oncology

## 2016-11-11 ENCOUNTER — Ambulatory Visit (HOSPITAL_BASED_OUTPATIENT_CLINIC_OR_DEPARTMENT_OTHER): Payer: BLUE CROSS/BLUE SHIELD

## 2016-11-11 VITALS — BP 147/87 | HR 71 | Temp 98.2°F | Resp 18 | Ht 66.0 in | Wt 162.7 lb

## 2016-11-11 DIAGNOSIS — C787 Secondary malignant neoplasm of liver and intrahepatic bile duct: Secondary | ICD-10-CM | POA: Diagnosis not present

## 2016-11-11 DIAGNOSIS — Z5111 Encounter for antineoplastic chemotherapy: Secondary | ICD-10-CM

## 2016-11-11 DIAGNOSIS — I10 Essential (primary) hypertension: Secondary | ICD-10-CM | POA: Diagnosis not present

## 2016-11-11 DIAGNOSIS — I509 Heart failure, unspecified: Secondary | ICD-10-CM

## 2016-11-11 DIAGNOSIS — C189 Malignant neoplasm of colon, unspecified: Secondary | ICD-10-CM

## 2016-11-11 DIAGNOSIS — D561 Beta thalassemia: Secondary | ICD-10-CM

## 2016-11-11 DIAGNOSIS — Z5112 Encounter for antineoplastic immunotherapy: Secondary | ICD-10-CM

## 2016-11-11 DIAGNOSIS — C187 Malignant neoplasm of sigmoid colon: Secondary | ICD-10-CM

## 2016-11-11 DIAGNOSIS — D509 Iron deficiency anemia, unspecified: Secondary | ICD-10-CM

## 2016-11-11 LAB — COMPREHENSIVE METABOLIC PANEL
ALT: 10 U/L (ref 0–55)
AST: 26 U/L (ref 5–34)
Albumin: 3.8 g/dL (ref 3.5–5.0)
Alkaline Phosphatase: 80 U/L (ref 40–150)
Anion Gap: 10 mEq/L (ref 3–11)
BUN: 13 mg/dL (ref 7.0–26.0)
CALCIUM: 10.3 mg/dL (ref 8.4–10.4)
CHLORIDE: 103 meq/L (ref 98–109)
CO2: 29 meq/L (ref 22–29)
Creatinine: 1 mg/dL (ref 0.6–1.1)
EGFR: 72 mL/min/{1.73_m2} — AB (ref >90)
Glucose: 104 mg/dl (ref 70–140)
POTASSIUM: 4.6 meq/L (ref 3.5–5.1)
Sodium: 142 mEq/L (ref 136–145)
Total Bilirubin: 1.21 mg/dL — ABNORMAL HIGH (ref 0.20–1.20)
Total Protein: 7.7 g/dL (ref 6.4–8.3)

## 2016-11-11 LAB — CBC WITH DIFFERENTIAL/PLATELET
BASO%: 0.6 % (ref 0.0–2.0)
Basophils Absolute: 0 10*3/uL (ref 0.0–0.1)
EOS ABS: 0.5 10*3/uL (ref 0.0–0.5)
EOS%: 9.9 % — ABNORMAL HIGH (ref 0.0–7.0)
HCT: 38.7 % (ref 34.8–46.6)
HGB: 11.7 g/dL (ref 11.6–15.9)
LYMPH%: 34.5 % (ref 14.0–49.7)
MCH: 23.7 pg — AB (ref 25.1–34.0)
MCHC: 30.2 g/dL — AB (ref 31.5–36.0)
MCV: 78.3 fL — AB (ref 79.5–101.0)
MONO#: 0.8 10*3/uL (ref 0.1–0.9)
MONO%: 15.8 % — AB (ref 0.0–14.0)
NEUT#: 2.1 10*3/uL (ref 1.5–6.5)
NEUT%: 39.2 % (ref 38.4–76.8)
NRBC: 0 % (ref 0–0)
PLATELETS: 222 10*3/uL (ref 145–400)
RBC: 4.94 10*6/uL (ref 3.70–5.45)
WBC: 5.3 10*3/uL (ref 3.9–10.3)
lymph#: 1.8 10*3/uL (ref 0.9–3.3)

## 2016-11-11 LAB — UA PROTEIN, DIPSTICK - CHCC: Protein, ur: 30 mg/dL

## 2016-11-11 MED ORDER — PALONOSETRON HCL INJECTION 0.25 MG/5ML
0.2500 mg | Freq: Once | INTRAVENOUS | Status: AC
  Administered 2016-11-11: 0.25 mg via INTRAVENOUS

## 2016-11-11 MED ORDER — PALONOSETRON HCL INJECTION 0.25 MG/5ML
INTRAVENOUS | Status: AC
  Filled 2016-11-11: qty 5

## 2016-11-11 MED ORDER — SODIUM CHLORIDE 0.9 % IV SOLN
5.5000 mg/kg | Freq: Once | INTRAVENOUS | Status: AC
  Administered 2016-11-11: 400 mg via INTRAVENOUS
  Filled 2016-11-11: qty 16

## 2016-11-11 MED ORDER — DEXTROSE 5 % IV SOLN
Freq: Once | INTRAVENOUS | Status: AC
  Administered 2016-11-11: 14:00:00 via INTRAVENOUS

## 2016-11-11 MED ORDER — SODIUM CHLORIDE 0.9 % IV SOLN: Freq: Once | INTRAVENOUS | Status: AC

## 2016-11-11 MED ORDER — DEXTROSE 5 % IV SOLN
400.0000 mg/m2 | Freq: Once | INTRAVENOUS | Status: AC
  Administered 2016-11-11: 740 mg via INTRAVENOUS
  Filled 2016-11-11: qty 37

## 2016-11-11 MED ORDER — SODIUM CHLORIDE 0.9 % IV SOLN
2400.0000 mg/m2 | INTRAVENOUS | Status: DC
  Administered 2016-11-11: 4450 mg via INTRAVENOUS
  Filled 2016-11-11: qty 89

## 2016-11-11 MED ORDER — DEXAMETHASONE SODIUM PHOSPHATE 10 MG/ML IJ SOLN
10.0000 mg | Freq: Once | INTRAMUSCULAR | Status: AC
  Administered 2016-11-11: 10 mg via INTRAVENOUS

## 2016-11-11 MED ORDER — SODIUM CHLORIDE 0.9 % IV SOLN
INTRAVENOUS | Status: DC
  Administered 2016-11-11: 13:00:00 via INTRAVENOUS

## 2016-11-11 MED ORDER — OXALIPLATIN CHEMO INJECTION 100 MG/20ML
82.0000 mg/m2 | Freq: Once | INTRAVENOUS | Status: AC
  Administered 2016-11-11: 150 mg via INTRAVENOUS
  Filled 2016-11-11: qty 20

## 2016-11-11 MED ORDER — DEXAMETHASONE SODIUM PHOSPHATE 10 MG/ML IJ SOLN
INTRAMUSCULAR | Status: AC
  Filled 2016-11-11: qty 1

## 2016-11-11 NOTE — Progress Notes (Signed)
  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 She completed another cycle of FOLFOX/Avastin beginning 10/28/2016. No mouth sores or diarrhea. She had nausea following chemotherapy that was relieved with Zofran. She had balance difficulty following chemotherapy and relates this to taking Compazine. No other focal neurologic symptoms. The balance difficulty resolved. She believes the abdominal wall mass is smaller. She continues to have discomfort at the left side of the mass. No peripheral numbness. She had cold sensitivity lasting 4 days following chemotherapy. No bleeding or symptom of thrombosis.  Objective:  Vital signs in last 24 hours:  There were no vitals taken for this visit.    HEENT: No thrush or ulcers Resp: Lungs clear bilaterally Cardio: Regular rate and rhythm, 2/6 diastolic murmur GI: No hepatosplenomegaly, upper abdominal wall mass is mobile  Vascular: No leg edema  Skin: Hyperpigmentation of the hands and feet with dry desquamation    Portacath/PICC-without erythema  Lab Results:  Lab Results  Component Value Date   WBC 6.4 10/28/2016   HGB 11.8 10/28/2016   HCT 37.1 10/28/2016   MCV 72.6 (L) 10/28/2016   PLT 246 10/28/2016   NEUTROABS 3.1 10/28/2016     Medications: I have reviewed the patient's current medications.  Assessment/Plan: 1. Metastatic colon cancer  Colonoscopy confirmed a sigmoid mass 04/03/2015 with biopsy confirming adenocarcinoma  CTs 04/06/2015 confirmed multiple liver metastases  Foundation 1 testing revealed NRAS G60E and [id number] mutations   Laparoscopic sigmoid colectomy, liver biopsy, and Port-A-Cath insertion 04/08/2015  Sigmoid colon tumor-T3 N0, liver biopsy confirmed metastatic adenocarcinoma  Chemotherapy initiated with FOLFIRI 05/08/2015, Avastin added with cycle 2  CT 07/14/2015-improvement in liver metastases  CT  10/12/2015-continued improvement in liver metastases  CT 06/11/2016-new small pleural effusions, stable hepatic metastases, stable soft tissue density at the ventral midline abdominal wall  Admission 06/16/2016 through 06/20/2016 with congestive heart failure and pneumonia  07/21/2016-biopsy of abdominal wall mass-positive for adenocarcinoma  08/12/2016-chemotherapy switch to Xeloda 2 weeks on/one-week off and Avastin every 2 weeks  09/24/2016-PET scan with new hypermetabolicomental nodules and a new hypermetabolic gastrohepatic ligament node, growth of abdominal wall mass and progression of liver metastases  Treatment switch to FOLFOX/Avastin beginning 10/14/2016  2. Congestive heart failure diagnosed in November 2017-unclear, infection related cardiomyopathy  3. Hypertension  4. History of iron deficiency anemia and beta thalassemia trait      Disposition: Dr, [name] appears stable. The plan is to proceed with a third cycle of FOLFOX/Avastin today. We will follow-up on the CEA from today. She will return for an office visit and chemotherapy in 2 weeks. 15 minutes were spent with the patient today. The majority of the time was used for counseling and coordination of care.  Betsy Coder, MD  11/11/2016  11:15 AM

## 2016-11-11 NOTE — Patient Instructions (Signed)
[facility] Discharge Instructions for Patients Receiving Chemotherapy  Today you received the following chemotherapy agents:  Avastin, Leucovorin, Oxaliplatin, Fluorouracil  To help prevent nausea and vomiting after your treatment, we encourage you to take your nausea medication as prescribed.   If you develop nausea and vomiting that is not controlled by your nausea medication, call the clinic.   BELOW ARE SYMPTOMS THAT SHOULD BE REPORTED IMMEDIATELY:  *FEVER GREATER THAN 100.5 F  *CHILLS WITH OR WITHOUT FEVER  NAUSEA AND VOMITING THAT IS NOT CONTROLLED WITH YOUR NAUSEA MEDICATION  *UNUSUAL SHORTNESS OF BREATH  *UNUSUAL BRUISING OR BLEEDING  TENDERNESS IN MOUTH AND THROAT WITH OR WITHOUT PRESENCE OF ULCERS  *URINARY PROBLEMS  *BOWEL PROBLEMS  UNUSUAL RASH Items with * indicate a potential emergency and should be followed up as soon as possible.  Feel free to call the clinic you have any questions or concerns. The clinic phone number is [phone].  Please show the [place] at check-in to the Emergency Department and triage nurse.

## 2016-11-11 NOTE — Addendum Note (Signed)
Addended by: Betsy Coder B on: 11/11/2016 01:00 PM   Modules accepted: Orders

## 2016-11-11 NOTE — Telephone Encounter (Signed)
Appointments scheduled per 4.5.18 LOS. Patient given AVS report and calendars with future scheduled appointments. °

## 2016-11-12 LAB — CEA (IN HOUSE-CHCC): CEA (CHCC-In House): 224.35 ng/mL — ABNORMAL HIGH (ref 0.00–5.00)

## 2016-11-13 ENCOUNTER — Ambulatory Visit (HOSPITAL_BASED_OUTPATIENT_CLINIC_OR_DEPARTMENT_OTHER): Payer: Self-pay

## 2016-11-13 VITALS — BP 139/79 | HR 77 | Temp 98.4°F | Resp 18

## 2016-11-13 DIAGNOSIS — C189 Malignant neoplasm of colon, unspecified: Secondary | ICD-10-CM

## 2016-11-13 DIAGNOSIS — Z95828 Presence of other vascular implants and grafts: Secondary | ICD-10-CM

## 2016-11-13 DIAGNOSIS — D500 Iron deficiency anemia secondary to blood loss (chronic): Secondary | ICD-10-CM

## 2016-11-13 DIAGNOSIS — C787 Secondary malignant neoplasm of liver and intrahepatic bile duct: Principal | ICD-10-CM

## 2016-11-13 MED ORDER — HEPARIN SOD (PORK) LOCK FLUSH 100 UNIT/ML IV SOLN
500.0000 [IU] | Freq: Once | INTRAVENOUS | Status: AC | PRN
  Administered 2016-11-13: 500 [IU]
  Filled 2016-11-13: qty 5

## 2016-11-13 MED ORDER — SODIUM CHLORIDE 0.9 % IJ SOLN
10.0000 mL | INTRAMUSCULAR | Status: DC | PRN
  Administered 2016-11-13: 10 mL via INTRAVENOUS
  Filled 2016-11-13: qty 10

## 2016-11-13 NOTE — Patient Instructions (Signed)

## 2016-11-21 ENCOUNTER — Other Ambulatory Visit: Payer: Self-pay | Admitting: Oncology

## 2016-11-25 ENCOUNTER — Ambulatory Visit (HOSPITAL_BASED_OUTPATIENT_CLINIC_OR_DEPARTMENT_OTHER): Payer: BLUE CROSS/BLUE SHIELD

## 2016-11-25 ENCOUNTER — Other Ambulatory Visit: Payer: Self-pay | Admitting: *Deleted

## 2016-11-25 ENCOUNTER — Ambulatory Visit (HOSPITAL_BASED_OUTPATIENT_CLINIC_OR_DEPARTMENT_OTHER): Payer: BLUE CROSS/BLUE SHIELD | Admitting: Oncology

## 2016-11-25 ENCOUNTER — Other Ambulatory Visit: Payer: Self-pay

## 2016-11-25 ENCOUNTER — Ambulatory Visit: Payer: BLUE CROSS/BLUE SHIELD

## 2016-11-25 ENCOUNTER — Ambulatory Visit: Payer: Self-pay | Admitting: Oncology

## 2016-11-25 VITALS — BP 158/87 | HR 79 | Temp 98.6°F | Resp 18 | Ht 66.0 in | Wt 162.2 lb

## 2016-11-25 DIAGNOSIS — C787 Secondary malignant neoplasm of liver and intrahepatic bile duct: Secondary | ICD-10-CM

## 2016-11-25 DIAGNOSIS — Z5112 Encounter for antineoplastic immunotherapy: Secondary | ICD-10-CM | POA: Diagnosis not present

## 2016-11-25 DIAGNOSIS — C187 Malignant neoplasm of sigmoid colon: Secondary | ICD-10-CM | POA: Diagnosis not present

## 2016-11-25 DIAGNOSIS — C189 Malignant neoplasm of colon, unspecified: Secondary | ICD-10-CM

## 2016-11-25 DIAGNOSIS — I509 Heart failure, unspecified: Secondary | ICD-10-CM

## 2016-11-25 DIAGNOSIS — I10 Essential (primary) hypertension: Secondary | ICD-10-CM | POA: Diagnosis not present

## 2016-11-25 DIAGNOSIS — Z79899 Other long term (current) drug therapy: Secondary | ICD-10-CM

## 2016-11-25 DIAGNOSIS — Z95828 Presence of other vascular implants and grafts: Secondary | ICD-10-CM

## 2016-11-25 DIAGNOSIS — Z5111 Encounter for antineoplastic chemotherapy: Secondary | ICD-10-CM | POA: Diagnosis not present

## 2016-11-25 DIAGNOSIS — D500 Iron deficiency anemia secondary to blood loss (chronic): Secondary | ICD-10-CM

## 2016-11-25 LAB — CBC WITH DIFFERENTIAL/PLATELET
BASO%: 0.4 % (ref 0.0–2.0)
Basophils Absolute: 0 10*3/uL (ref 0.0–0.1)
EOS%: 8.1 % — AB (ref 0.0–7.0)
Eosinophils Absolute: 0.4 10*3/uL (ref 0.0–0.5)
HEMATOCRIT: 36.4 % (ref 34.8–46.6)
HGB: 11.4 g/dL — ABNORMAL LOW (ref 11.6–15.9)
LYMPH#: 1.3 10*3/uL (ref 0.9–3.3)
LYMPH%: 24.3 % (ref 14.0–49.7)
MCH: 24.7 pg — AB (ref 25.1–34.0)
MCHC: 31.4 g/dL — AB (ref 31.5–36.0)
MCV: 78.7 fL — ABNORMAL LOW (ref 79.5–101.0)
MONO#: 1.1 10*3/uL — ABNORMAL HIGH (ref 0.1–0.9)
MONO%: 20 % — AB (ref 0.0–14.0)
NEUT#: 2.6 10*3/uL (ref 1.5–6.5)
NEUT%: 47.2 % (ref 38.4–76.8)
Platelets: 222 10*3/uL (ref 145–400)
RBC: 4.63 10*6/uL (ref 3.70–5.45)
RDW: 24.9 % — ABNORMAL HIGH (ref 11.2–14.5)
WBC: 5.4 10*3/uL (ref 3.9–10.3)

## 2016-11-25 LAB — COMPREHENSIVE METABOLIC PANEL
ALT: 9 U/L (ref 0–55)
AST: 27 U/L (ref 5–34)
Albumin: 3.8 g/dL (ref 3.5–5.0)
Alkaline Phosphatase: 88 U/L (ref 40–150)
Anion Gap: 13 mEq/L — ABNORMAL HIGH (ref 3–11)
BILIRUBIN TOTAL: 0.94 mg/dL (ref 0.20–1.20)
BUN: 13.4 mg/dL (ref 7.0–26.0)
CALCIUM: 10.2 mg/dL (ref 8.4–10.4)
CHLORIDE: 100 meq/L (ref 98–109)
CO2: 28 meq/L (ref 22–29)
CREATININE: 1 mg/dL (ref 0.6–1.1)
EGFR: 74 mL/min/{1.73_m2} — ABNORMAL LOW (ref >90)
Glucose: 97 mg/dl (ref 70–140)
POTASSIUM: 4.2 meq/L (ref 3.5–5.1)
SODIUM: 141 meq/L (ref 136–145)
Total Protein: 7.8 g/dL (ref 6.4–8.3)

## 2016-11-25 LAB — CEA (IN HOUSE-CHCC): CEA (CHCC-In House): 238.32 ng/mL — ABNORMAL HIGH (ref 0.00–5.00)

## 2016-11-25 LAB — UA PROTEIN, DIPSTICK - CHCC: Protein, ur: NEGATIVE mg/dL

## 2016-11-25 MED ORDER — SODIUM CHLORIDE 0.9 % IV SOLN
2400.0000 mg/m2 | INTRAVENOUS | Status: DC
  Administered 2016-11-25: 4450 mg via INTRAVENOUS
  Filled 2016-11-25: qty 89

## 2016-11-25 MED ORDER — DEXAMETHASONE SODIUM PHOSPHATE 10 MG/ML IJ SOLN
INTRAMUSCULAR | Status: AC
  Filled 2016-11-25: qty 1

## 2016-11-25 MED ORDER — DEXTROSE 5 % IV SOLN
Freq: Once | INTRAVENOUS | Status: AC
  Administered 2016-11-25: 12:00:00 via INTRAVENOUS

## 2016-11-25 MED ORDER — FUROSEMIDE 10 MG/ML IJ SOLN
INTRAMUSCULAR | Status: AC
  Filled 2016-11-25: qty 2

## 2016-11-25 MED ORDER — OXYCODONE HCL 5 MG PO TABS: 5.0000 mg | ORAL_TABLET | Freq: Four times a day (QID) | ORAL | 0 refills | Status: DC | PRN

## 2016-11-25 MED ORDER — FUROSEMIDE 20 MG PO TABS
ORAL_TABLET | ORAL | Status: AC
  Filled 2016-11-25: qty 1

## 2016-11-25 MED ORDER — LEUCOVORIN CALCIUM INJECTION 350 MG
400.0000 mg/m2 | Freq: Once | INTRAVENOUS | Status: AC
  Administered 2016-11-25: 740 mg via INTRAVENOUS
  Filled 2016-11-25: qty 37

## 2016-11-25 MED ORDER — HEPARIN SOD (PORK) LOCK FLUSH 100 UNIT/ML IV SOLN
500.0000 [IU] | Freq: Once | INTRAVENOUS | Status: DC | PRN
  Filled 2016-11-25: qty 5

## 2016-11-25 MED ORDER — FUROSEMIDE 10 MG/ML IJ SOLN
20.0000 mg | Freq: Once | INTRAMUSCULAR | Status: AC
  Administered 2016-11-25: 20 mg via INTRAVENOUS

## 2016-11-25 MED ORDER — SODIUM CHLORIDE 0.9 % IJ SOLN
10.0000 mL | INTRAMUSCULAR | Status: DC | PRN
  Administered 2016-11-25: 10 mL via INTRAVENOUS
  Filled 2016-11-25: qty 10

## 2016-11-25 MED ORDER — PALONOSETRON HCL INJECTION 0.25 MG/5ML
INTRAVENOUS | Status: AC
  Filled 2016-11-25: qty 5

## 2016-11-25 MED ORDER — OXALIPLATIN CHEMO INJECTION 100 MG/20ML
82.0000 mg/m2 | Freq: Once | INTRAVENOUS | Status: AC
  Administered 2016-11-25: 150 mg via INTRAVENOUS
  Filled 2016-11-25: qty 10

## 2016-11-25 MED ORDER — PALONOSETRON HCL INJECTION 0.25 MG/5ML
0.2500 mg | Freq: Once | INTRAVENOUS | Status: AC
  Administered 2016-11-25: 0.25 mg via INTRAVENOUS

## 2016-11-25 MED ORDER — SODIUM CHLORIDE 0.9 % IV SOLN
Freq: Once | INTRAVENOUS | Status: AC
  Administered 2016-11-25: 11:00:00 via INTRAVENOUS

## 2016-11-25 MED ORDER — SODIUM CHLORIDE 0.9 % IV SOLN
5.5000 mg/kg | Freq: Once | INTRAVENOUS | Status: AC
  Administered 2016-11-25: 400 mg via INTRAVENOUS
  Filled 2016-11-25: qty 16

## 2016-11-25 MED ORDER — DEXAMETHASONE SODIUM PHOSPHATE 10 MG/ML IJ SOLN
10.0000 mg | Freq: Once | INTRAMUSCULAR | Status: AC
  Administered 2016-11-25: 10 mg via INTRAVENOUS

## 2016-11-25 MED ORDER — FUROSEMIDE 20 MG PO TABS: 20.0000 mg | ORAL_TABLET | Freq: Once | ORAL | Status: DC

## 2016-11-25 NOTE — Progress Notes (Signed)
  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 No mouth sores, nausea, or neuropathy symptoms following this cycle of chemotherapy. She had reflux symptoms. She has noted increased pain at the upper abdominal mass and increased abdominal distention. She has been taking oxycodone more frequently for relief of pain. She is working.  Objective:  Vital signs in last 24 hours:  Blood pressure (!) 158/87, pulse 79, temperature 98.6 F (37 C), temperature source Oral, resp. rate 18, height _0  (1.676 m), weight 162 lb 3.2 oz (73.6 kg), SpO2 100 %.    HEENT: No thrush or ulcers Resp: Lungs clear bilaterally Cardio: Regular rate and rhythm GI: No apparent ascites, the upper midline abdominal wall mass is mobile and nontender, masslike fullness in the abdominal wall superior to the dominant mass Vascular: No leg edema  Skin: Hyperpigmentation of the hands and feet, superficial desquamation of the feet  Portacath/PICC-without erythema  Lab Results:  Lab Results  Component Value Date   WBC 5.4 11/25/2016   HGB 11.4 (L) 11/25/2016   HCT 36.4 11/25/2016   MCV 78.7 (L) 11/25/2016   PLT 222 11/25/2016   NEUTROABS 2.6 11/25/2016    Medications: I have reviewed the patient's current medications.  Assessment/Plan: 1. 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  Laparoscopic sigmoid colectomy, liver biopsy, and Port-A-Cath insertion 04/08/2015  Sigmoid colon tumor-T3 N0, liver biopsy confirmed metastatic adenocarcinoma  Chemotherapy initiated with FOLFIRI 05/08/2015, Avastin added with cycle 2  CT 07/14/2015-improvement in liver metastases  CT 10/12/2015-continued improvement  in liver metastases  CT 06/11/2016-new small pleural effusions, stable hepatic metastases, stable soft tissue density at the ventral midline abdominal wall  Admission 06/16/2016 through 06/20/2016 with congestive heart failure and pneumonia  07/21/2016-biopsy of abdominal wall mass-positive for adenocarcinoma  08/12/2016-chemotherapy switch to Xeloda 2 weeks on/one-week off and Avastin every 2 weeks  09/24/2016-PET scan with new hypermetabolicomental nodules and a new hypermetabolic gastrohepatic ligament node, growth of abdominal wall mass and progression of liver metastases  Treatment switch to FOLFOX/Avastin beginning 10/14/2016  2. Congestive heart failure diagnosed in November 2017-unclear, infection related cardiomyopathy  3. Hypertension  4. 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 The plan is to proceed with cycle 4 today. She has increased pain at the upper abdomen and more masslike fullness superior to the dominant abdominal wall mass. The CEA was stable when she was here on 07/13/2017.  She will return for an office visit and chemotherapy in 2 weeks. She will be referred for a restaging CT after cycle 5 FOLFOX/Avastin.  She will contact us as needed in the interim.  25 minutes were spent with the patient today. The majority of the time was used for counseling and coordination of care.  Betsy Coder, MD  11/25/2016  10:05 AM

## 2016-11-26 ENCOUNTER — Telehealth: Payer: Self-pay | Admitting: Oncology

## 2016-11-26 NOTE — Telephone Encounter (Signed)
Patient bypassed scheduling/check out area on 11/25/16. Appointments scheduled per 11/25/16 los. Patient was mailed a copy of the appointment schedule and letter.

## 2016-11-27 ENCOUNTER — Ambulatory Visit (HOSPITAL_BASED_OUTPATIENT_CLINIC_OR_DEPARTMENT_OTHER): Payer: BLUE CROSS/BLUE SHIELD

## 2016-11-27 VITALS — BP 129/83 | HR 79 | Temp 99.2°F | Resp 18

## 2016-11-27 DIAGNOSIS — C189 Malignant neoplasm of colon, unspecified: Secondary | ICD-10-CM

## 2016-11-27 DIAGNOSIS — C787 Secondary malignant neoplasm of liver and intrahepatic bile duct: Principal | ICD-10-CM

## 2016-11-27 MED ORDER — HEPARIN SOD (PORK) LOCK FLUSH 100 UNIT/ML IV SOLN
500.0000 [IU] | Freq: Once | INTRAVENOUS | Status: AC | PRN
  Administered 2016-11-27: 500 [IU]
  Filled 2016-11-27: qty 5

## 2016-11-27 MED ORDER — SODIUM CHLORIDE 0.9% FLUSH
10.0000 mL | INTRAVENOUS | Status: DC | PRN
  Administered 2016-11-27: 10 mL
  Filled 2016-11-27: qty 10

## 2016-12-05 ENCOUNTER — Other Ambulatory Visit: Payer: Self-pay | Admitting: Oncology

## 2016-12-09 ENCOUNTER — Ambulatory Visit (HOSPITAL_BASED_OUTPATIENT_CLINIC_OR_DEPARTMENT_OTHER): Payer: BLUE CROSS/BLUE SHIELD

## 2016-12-09 ENCOUNTER — Other Ambulatory Visit (HOSPITAL_BASED_OUTPATIENT_CLINIC_OR_DEPARTMENT_OTHER): Payer: BLUE CROSS/BLUE SHIELD

## 2016-12-09 ENCOUNTER — Ambulatory Visit (HOSPITAL_BASED_OUTPATIENT_CLINIC_OR_DEPARTMENT_OTHER): Payer: BLUE CROSS/BLUE SHIELD | Admitting: Oncology

## 2016-12-09 VITALS — BP 139/93 | HR 81 | Temp 98.9°F | Resp 18 | Ht 66.0 in | Wt 162.3 lb

## 2016-12-09 VITALS — BP 122/62 | HR 78 | Resp 18

## 2016-12-09 DIAGNOSIS — C7989 Secondary malignant neoplasm of other specified sites: Secondary | ICD-10-CM | POA: Diagnosis not present

## 2016-12-09 DIAGNOSIS — C187 Malignant neoplasm of sigmoid colon: Secondary | ICD-10-CM | POA: Diagnosis not present

## 2016-12-09 DIAGNOSIS — Z5111 Encounter for antineoplastic chemotherapy: Secondary | ICD-10-CM

## 2016-12-09 DIAGNOSIS — C787 Secondary malignant neoplasm of liver and intrahepatic bile duct: Secondary | ICD-10-CM | POA: Diagnosis not present

## 2016-12-09 DIAGNOSIS — D500 Iron deficiency anemia secondary to blood loss (chronic): Secondary | ICD-10-CM

## 2016-12-09 DIAGNOSIS — C189 Malignant neoplasm of colon, unspecified: Secondary | ICD-10-CM

## 2016-12-09 DIAGNOSIS — Z5112 Encounter for antineoplastic immunotherapy: Secondary | ICD-10-CM | POA: Diagnosis not present

## 2016-12-09 DIAGNOSIS — D509 Iron deficiency anemia, unspecified: Secondary | ICD-10-CM

## 2016-12-09 DIAGNOSIS — Z452 Encounter for adjustment and management of vascular access device: Secondary | ICD-10-CM | POA: Diagnosis not present

## 2016-12-09 DIAGNOSIS — I10 Essential (primary) hypertension: Secondary | ICD-10-CM | POA: Diagnosis not present

## 2016-12-09 DIAGNOSIS — Z95828 Presence of other vascular implants and grafts: Secondary | ICD-10-CM

## 2016-12-09 LAB — CBC WITH DIFFERENTIAL/PLATELET
BASO%: 0.4 % (ref 0.0–2.0)
BASOS ABS: 0 10*3/uL (ref 0.0–0.1)
EOS%: 11.8 % — AB (ref 0.0–7.0)
Eosinophils Absolute: 0.6 10*3/uL — ABNORMAL HIGH (ref 0.0–0.5)
HCT: 37 % (ref 34.8–46.6)
HGB: 11.5 g/dL — ABNORMAL LOW (ref 11.6–15.9)
LYMPH%: 21.5 % (ref 14.0–49.7)
MCH: 25.1 pg (ref 25.1–34.0)
MCHC: 31 g/dL — AB (ref 31.5–36.0)
MCV: 81 fL (ref 79.5–101.0)
MONO#: 0.8 10*3/uL (ref 0.1–0.9)
MONO%: 17.6 % — ABNORMAL HIGH (ref 0.0–14.0)
NEUT#: 2.3 10*3/uL (ref 1.5–6.5)
NEUT%: 48.7 % (ref 38.4–76.8)
PLATELETS: 224 10*3/uL (ref 145–400)
RBC: 4.57 10*6/uL (ref 3.70–5.45)
RDW: 20.2 % — ABNORMAL HIGH (ref 11.2–14.5)
WBC: 4.6 10*3/uL (ref 3.9–10.3)
lymph#: 1 10*3/uL (ref 0.9–3.3)

## 2016-12-09 LAB — COMPREHENSIVE METABOLIC PANEL
ALT: 10 U/L (ref 0–55)
AST: 27 U/L (ref 5–34)
Albumin: 3.7 g/dL (ref 3.5–5.0)
Alkaline Phosphatase: 96 U/L (ref 40–150)
Anion Gap: 10 mEq/L (ref 3–11)
BUN: 18.5 mg/dL (ref 7.0–26.0)
CALCIUM: 10 mg/dL (ref 8.4–10.4)
CHLORIDE: 102 meq/L (ref 98–109)
CO2: 30 mEq/L — ABNORMAL HIGH (ref 22–29)
Creatinine: 1 mg/dL (ref 0.6–1.1)
EGFR: 73 mL/min/{1.73_m2} — AB (ref >90)
Glucose: 96 mg/dl (ref 70–140)
POTASSIUM: 4.1 meq/L (ref 3.5–5.1)
Sodium: 142 mEq/L (ref 136–145)
Total Bilirubin: 0.79 mg/dL (ref 0.20–1.20)
Total Protein: 7.9 g/dL (ref 6.4–8.3)

## 2016-12-09 LAB — CEA (IN HOUSE-CHCC): CEA (CHCC-In House): 239.85 ng/mL — ABNORMAL HIGH (ref 0.00–5.00)

## 2016-12-09 LAB — UA PROTEIN, DIPSTICK - CHCC: PROTEIN: 30 mg/dL

## 2016-12-09 MED ORDER — SODIUM CHLORIDE 0.9 % IJ SOLN
10.0000 mL | INTRAMUSCULAR | Status: DC | PRN
  Administered 2016-12-09: 10 mL via INTRAVENOUS
  Filled 2016-12-09: qty 10

## 2016-12-09 MED ORDER — SODIUM CHLORIDE 0.9 % IV SOLN
Freq: Once | INTRAVENOUS | Status: AC
  Administered 2016-12-09: 09:00:00 via INTRAVENOUS

## 2016-12-09 MED ORDER — DEXAMETHASONE SODIUM PHOSPHATE 10 MG/ML IJ SOLN
10.0000 mg | Freq: Once | INTRAMUSCULAR | Status: AC
  Administered 2016-12-09: 10 mg via INTRAVENOUS

## 2016-12-09 MED ORDER — FUROSEMIDE 10 MG/ML IJ SOLN
INTRAMUSCULAR | Status: AC
  Filled 2016-12-09: qty 2

## 2016-12-09 MED ORDER — LEUCOVORIN CALCIUM INJECTION 350 MG
400.0000 mg/m2 | Freq: Once | INTRAMUSCULAR | Status: AC
  Administered 2016-12-09: 740 mg via INTRAVENOUS
  Filled 2016-12-09: qty 37

## 2016-12-09 MED ORDER — DEXTROSE 5 % IV SOLN
Freq: Once | INTRAVENOUS | Status: AC
  Administered 2016-12-09: 11:00:00 via INTRAVENOUS

## 2016-12-09 MED ORDER — PALONOSETRON HCL INJECTION 0.25 MG/5ML
INTRAVENOUS | Status: AC
  Filled 2016-12-09: qty 5

## 2016-12-09 MED ORDER — DEXAMETHASONE SODIUM PHOSPHATE 10 MG/ML IJ SOLN
INTRAMUSCULAR | Status: AC
  Filled 2016-12-09: qty 1

## 2016-12-09 MED ORDER — OXALIPLATIN CHEMO INJECTION 100 MG/20ML
82.0000 mg/m2 | Freq: Once | INTRAVENOUS | Status: AC
  Administered 2016-12-09: 150 mg via INTRAVENOUS
  Filled 2016-12-09: qty 20

## 2016-12-09 MED ORDER — FUROSEMIDE 10 MG/ML IJ SOLN
20.0000 mg | Freq: Once | INTRAMUSCULAR | Status: AC
  Administered 2016-12-09: 20 mg via INTRAVENOUS

## 2016-12-09 MED ORDER — SODIUM CHLORIDE 0.9 % IV SOLN
2400.0000 mg/m2 | INTRAVENOUS | Status: DC
  Administered 2016-12-09: 4450 mg via INTRAVENOUS
  Filled 2016-12-09: qty 89

## 2016-12-09 MED ORDER — PALONOSETRON HCL INJECTION 0.25 MG/5ML
0.2500 mg | Freq: Once | INTRAVENOUS | Status: AC
  Administered 2016-12-09: 0.25 mg via INTRAVENOUS

## 2016-12-09 MED ORDER — SODIUM CHLORIDE 0.9 % IV SOLN
5.5000 mg/kg | Freq: Once | INTRAVENOUS | Status: AC
  Administered 2016-12-09: 400 mg via INTRAVENOUS
  Filled 2016-12-09: qty 16

## 2016-12-09 NOTE — Progress Notes (Signed)
Per Dr. [name], pt to get 20mg  IV lasix today with treatment. Gave 20mg  IV lasix in between oxali/leuco infusion. Okay with pharmacy.

## 2016-12-09 NOTE — Patient Instructions (Signed)
[facility] Discharge Instructions for Patients Receiving Chemotherapy  Today you received the following chemotherapy agents: Oxaliplatin, Leucovorin, Adrucil and Avastin   To help prevent nausea and vomiting after your treatment, we encourage you to take your nausea medication as directed.    If you develop nausea and vomiting that is not controlled by your nausea medication, call the clinic.   BELOW ARE SYMPTOMS THAT SHOULD BE REPORTED IMMEDIATELY:  *FEVER GREATER THAN 100.5 F  *CHILLS WITH OR WITHOUT FEVER  NAUSEA AND VOMITING THAT IS NOT CONTROLLED WITH YOUR NAUSEA MEDICATION  *UNUSUAL SHORTNESS OF BREATH  *UNUSUAL BRUISING OR BLEEDING  TENDERNESS IN MOUTH AND THROAT WITH OR WITHOUT PRESENCE OF ULCERS  *URINARY PROBLEMS  *BOWEL PROBLEMS  UNUSUAL RASH Items with * indicate a potential emergency and should be followed up as soon as possible.  Feel free to call the clinic you have any questions or concerns. The clinic phone number is [phone].  Please show the CHEMO ALERT CARD at check-in to the Emergency Department and triage nurse.   

## 2016-12-09 NOTE — Progress Notes (Signed)
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 She began another cycle of FOLFOX/Avastin 11/25/2017. She had mild nausea following chemotherapy. This improved with Compazine. Cold sensitivity lasted for-5 days. No neuropathy symptoms at present. The upper abdominal pain has improved, but she had more severe pain yesterday. No diarrhea. No bleeding or symptom of thrombosis.  Objective:  Vital signs in last 24 hours:  Blood pressure (!) 139/93, pulse 81, temperature 98.9 F (37.2 C), temperature source Oral, resp. rate 18, height _0  (1.676 m), weight 162 lb 4.8 oz (73.6 kg), SpO2 100 %.    HEENT: No thrush or ulcers Resp: Lungs clear bilaterally Cardio: Regular rate and rhythm, 2/6 diastolic murmur GI: No hepatosplenomegaly, mobile mass at the mid upper abdominal wall. Superior to the main mass there is an area of firm fullness Vascular: No leg edema Neuro: Mild loss of vibratory sense at the fingertips bilaterally  Skin: Hyperpigmentation of the hands and feet with skin thickening. No skin breakdown.   Portacath/PICC-without erythema  Lab Results:  Lab Results  Component Value Date   WBC 4.6 12/09/2016   HGB 11.5 (L) 12/09/2016   HCT 37.0 12/09/2016   MCV 81.0 12/09/2016   PLT 224 12/09/2016   NEUTROABS 2.3 12/09/2016    CMP     Component Value Date/Time   NA 141 11/25/2016 0905   K 4.2 11/25/2016 0905   CL 106 06/19/2016 1205   CO2 28 11/25/2016 0905   GLUCOSE 97 11/25/2016 0905   BUN 13.4 11/25/2016 0905   CREATININE 1.0 11/25/2016 0905   CALCIUM 10.2 11/25/2016 0905   PROT 7.8 11/25/2016 0905   ALBUMIN 3.8 11/25/2016 0905   AST 27 11/25/2016 0905   ALT 9 11/25/2016 0905   ALKPHOS 88 11/25/2016 0905   BILITOT 0.94 11/25/2016 0905   GFRNONAA 41 (L) 06/19/2016 1205   GFRAA 48 (L) 06/19/2016 1205    Lab Results  Component Value Date   CEA1 238.32 (H) 11/25/2016      Medications: I have reviewed the patient's current medications.  Assessment/Plan: 1. 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  Laparoscopic sigmoid colectomy, liver biopsy, and Port-A-Cath insertion 04/08/2015  Sigmoid colon tumor-T3 N0, liver biopsy confirmed metastatic adenocarcinoma  Chemotherapy initiated with FOLFIRI 05/08/2015, Avastin added with cycle 2  CT 07/14/2015-improvement in liver metastases  CT 10/12/2015-continued improvement in liver metastases  CT 06/11/2016-new small pleural effusions, stable hepatic metastases, stable soft tissue density at the ventral midline abdominal wall  Admission 06/16/2016 through 06/20/2016 with congestive heart failure and pneumonia  07/21/2016-biopsy of abdominal wall mass-positive for adenocarcinoma  08/12/2016-chemotherapy switch to Xeloda 2 weeks on/one-week off and Avastin every 2 weeks  09/24/2016-PET scan with new hypermetabolicomental nodules and a new hypermetabolic gastrohepatic ligament node, growth of abdominal wall mass and progression of liver metastases  Treatment switch to FOLFOX/Avastin beginning 10/14/2016  2. Congestive heart failure diagnosed in November 2017-unclear, infection related cardiomyopathy  3. Hypertension  4. History of iron deficiency anemia and beta thalassemia trait   Disposition:  She appears stable. She has completed 4 cycles of FOLFOX/Avastin. The plan is to complete cycle 5 today and then undergo a restaging CT evaluation.  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  15 minutes were spent with the patient today. The  majority of the time was used for counseling and coordination of care.  Betsy Coder, MD  12/09/2016  9:07 AM

## 2016-12-11 ENCOUNTER — Ambulatory Visit (HOSPITAL_BASED_OUTPATIENT_CLINIC_OR_DEPARTMENT_OTHER): Payer: BLUE CROSS/BLUE SHIELD

## 2016-12-11 VITALS — BP 135/85 | HR 73 | Temp 99.2°F | Resp 18

## 2016-12-11 DIAGNOSIS — C189 Malignant neoplasm of colon, unspecified: Secondary | ICD-10-CM

## 2016-12-11 DIAGNOSIS — C187 Malignant neoplasm of sigmoid colon: Secondary | ICD-10-CM | POA: Diagnosis not present

## 2016-12-11 DIAGNOSIS — Z452 Encounter for adjustment and management of vascular access device: Secondary | ICD-10-CM | POA: Diagnosis not present

## 2016-12-11 DIAGNOSIS — C787 Secondary malignant neoplasm of liver and intrahepatic bile duct: Principal | ICD-10-CM

## 2016-12-11 MED ORDER — SODIUM CHLORIDE 0.9% FLUSH
10.0000 mL | INTRAVENOUS | Status: DC | PRN
  Administered 2016-12-11: 10 mL
  Filled 2016-12-11: qty 10

## 2016-12-11 MED ORDER — HEPARIN SOD (PORK) LOCK FLUSH 100 UNIT/ML IV SOLN
500.0000 [IU] | Freq: Once | INTRAVENOUS | Status: AC | PRN
  Administered 2016-12-11: 500 [IU]
  Filled 2016-12-11: qty 5

## 2016-12-13 ENCOUNTER — Other Ambulatory Visit: Payer: Self-pay | Admitting: *Deleted

## 2016-12-13 MED ORDER — LIDOCAINE-PRILOCAINE 2.5-2.5 % EX CREA: TOPICAL_CREAM | CUTANEOUS | 2 refills | Status: DC

## 2016-12-13 NOTE — Telephone Encounter (Signed)
Received refill request from [facility] for EMLA. E-scribed.

## 2016-12-16 ENCOUNTER — Other Ambulatory Visit: Payer: Self-pay | Admitting: *Deleted

## 2016-12-16 MED ORDER — LIDOCAINE-PRILOCAINE 2.5-2.5 % EX CREA: TOPICAL_CREAM | CUTANEOUS | 2 refills | Status: AC

## 2016-12-17 ENCOUNTER — Ambulatory Visit (HOSPITAL_COMMUNITY)
Admission: RE | Admit: 2016-12-17 | Discharge: 2016-12-17 | Disposition: A | Discharge status: Home / Self Care | Payer: BLUE CROSS/BLUE SHIELD | Source: Ambulatory Visit | Attending: Oncology | Admitting: Oncology

## 2016-12-17 ENCOUNTER — Encounter (HOSPITAL_COMMUNITY): Payer: Self-pay

## 2016-12-17 DIAGNOSIS — C786 Secondary malignant neoplasm of retroperitoneum and peritoneum: Secondary | ICD-10-CM | POA: Insufficient documentation

## 2016-12-17 DIAGNOSIS — C787 Secondary malignant neoplasm of liver and intrahepatic bile duct: Secondary | ICD-10-CM | POA: Diagnosis present

## 2016-12-17 DIAGNOSIS — R590 Localized enlarged lymph nodes: Secondary | ICD-10-CM | POA: Diagnosis not present

## 2016-12-17 DIAGNOSIS — C189 Malignant neoplasm of colon, unspecified: Secondary | ICD-10-CM | POA: Diagnosis present

## 2016-12-17 DIAGNOSIS — K8020 Calculus of gallbladder without cholecystitis without obstruction: Secondary | ICD-10-CM | POA: Diagnosis not present

## 2016-12-17 DIAGNOSIS — D259 Leiomyoma of uterus, unspecified: Secondary | ICD-10-CM | POA: Diagnosis not present

## 2016-12-17 MED ORDER — IOPAMIDOL (ISOVUE-300) INJECTION 61%
INTRAVENOUS | Status: AC
  Administered 2016-12-17: 100 mL via INTRAVENOUS
  Filled 2016-12-17: qty 100

## 2016-12-23 ENCOUNTER — Ambulatory Visit (HOSPITAL_BASED_OUTPATIENT_CLINIC_OR_DEPARTMENT_OTHER): Payer: BLUE CROSS/BLUE SHIELD | Admitting: Oncology

## 2016-12-23 ENCOUNTER — Ambulatory Visit: Payer: BLUE CROSS/BLUE SHIELD

## 2016-12-23 ENCOUNTER — Other Ambulatory Visit (HOSPITAL_BASED_OUTPATIENT_CLINIC_OR_DEPARTMENT_OTHER): Payer: BLUE CROSS/BLUE SHIELD

## 2016-12-23 DIAGNOSIS — C189 Malignant neoplasm of colon, unspecified: Secondary | ICD-10-CM

## 2016-12-23 DIAGNOSIS — C787 Secondary malignant neoplasm of liver and intrahepatic bile duct: Principal | ICD-10-CM

## 2016-12-23 DIAGNOSIS — C187 Malignant neoplasm of sigmoid colon: Secondary | ICD-10-CM | POA: Diagnosis not present

## 2016-12-23 DIAGNOSIS — I10 Essential (primary) hypertension: Secondary | ICD-10-CM | POA: Diagnosis not present

## 2016-12-23 MED ORDER — OXYCODONE HCL 5 MG PO TABS: 5.0000 mg | ORAL_TABLET | Freq: Four times a day (QID) | ORAL | 0 refills | Status: DC | PRN

## 2016-12-23 NOTE — Progress Notes (Signed)
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 She completed another cycle of FOLFOX/Avastin on 12/09/2016. She reports less fatigue following this cycle of chemotherapy. The upper abdominal pain has increased. She now takes 4-5 oxycodone tablets per day for relief of pain. No neuropathy symptoms. She continues working.    Objective:  Vital signs in last 24 hours:  Blood pressure (!) 154/90, pulse 76, temperature 99.8 F (37.7 C), temperature source Oral, resp. rate 18, height 5' 6"  (1.676 m), weight 159 lb 12.8 oz (72.5 kg), SpO2 100 %.    HEENT: No thrush or ulcers Resp: Lungs clear bilaterally Cardio: Regular rate and rhythm GI: No hepatosplenomegaly, no apparent ascites, subxiphoid mass is mobile. Superior to the dominant mass there is firm fullness Vascular: No leg edema  Skin: Hyperpigmentation of the hands   Portacath/PICC-without erythema  Lab Results:  Lab Results  Component Value Date   WBC 4.6 12/09/2016   HGB 11.5 (L) 12/09/2016   HCT 37.0 12/09/2016   MCV 81.0 12/09/2016   PLT 224 12/09/2016   NEUTROABS 2.3 12/09/2016    CMP     Component Value Date/Time   NA 142 12/09/2016 0807   K 4.1 12/09/2016 0807   CL 106 06/19/2016 1205   CO2 30 (H) 12/09/2016 0807   GLUCOSE 96 12/09/2016 0807   BUN 18.5 12/09/2016 0807   CREATININE 1.0 12/09/2016 0807   CALCIUM 10.0 12/09/2016 0807   PROT 7.9 12/09/2016 0807   ALBUMIN 3.7 12/09/2016 0807   AST 27 12/09/2016 0807   ALT 10 12/09/2016 0807   ALKPHOS 96 12/09/2016 0807   BILITOT 0.79 12/09/2016 0807   GFRNONAA 41 (L) 06/19/2016 1205   GFRAA 48 (L) 06/19/2016 1205    Lab Results  Component Value Date   CEA1 239.85 (H) 12/09/2016     Imaging:CT of the abdomen/pelvis 12/17/2016-images reviewed  Medications: I have reviewed the patient's current medications.  Assessment/Plan: 1. 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  Laparoscopic sigmoid colectomy, liver biopsy, and Port-A-Cath insertion 04/08/2015  Sigmoid colon tumor-T3 N0, liver biopsy confirmed metastatic adenocarcinoma  Chemotherapy initiated with FOLFIRI 05/08/2015, Avastin added with cycle 2  CT 07/14/2015-improvement in liver metastases  CT 10/12/2015-continued improvement in liver metastases  CT 06/11/2016-new small pleural effusions, stable hepatic metastases, stable soft tissue density at the ventral midline abdominal wall  Admission 06/16/2016 through 06/20/2016 with congestive heart failure and pneumonia  07/21/2016-biopsy of abdominal wall mass-positive for adenocarcinoma  08/12/2016-chemotherapy switch to Xeloda 2 weeks on/one-week off and Avastin every 2 weeks  09/24/2016-PET scan with new hypermetabolicomental nodules and a new hypermetabolic gastrohepatic ligament node, growth of abdominal wall mass and progression of liver metastases  Treatment switch to FOLFOX/Avastin beginning 10/14/2016  CT abdomen/pelvis 12/17/2016-enlargement of liver metastases, increased celiac node, increased size of peritoneal nodules, stable anterior abdominal wall mass  2. Congestive heart failure diagnosed in November 2017-unclear, infection related cardiomyopathy  3. Hypertension  4. 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 The CEA has stabilized over the past few months.  We discussed treatment options. I recommend discontinuing FOLFOX/Avastin. Treatment options with standard therapies are limited. 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 This is an unusual combination of mutations. She may be  eligible for treatment with an EGFR inhibitor, BRAF inhibitor, and chemotherapy. We can also consider LONSURF .  I will refer her to Dr. Reynaldo Minium to see if she is eligible for a clinical trial and to get his opinion regarding targeted therapy with her mutations.  She will return for an office visit and further discussion in 2 weeks.  30 minutes were spent with the patient today. The majority of the time was used for counseling and coordination of care.   Betsy Coder, MD  12/23/2016  3:43 PM

## 2016-12-30 ENCOUNTER — Telehealth: Payer: Self-pay | Admitting: Oncology

## 2016-12-30 NOTE — Telephone Encounter (Signed)
Patient bypassed scheduling on 12/23/16. Follow up with Dr [name] was scheduled for 01/06/17 @ 8:30 a.m. Per 12/23/16 los. Appointment confirmed with patient, per 12/23/16 los. Referral routed to Kim/HIM.

## 2017-01-04 ENCOUNTER — Telehealth: Payer: Self-pay | Admitting: Oncology

## 2017-01-04 NOTE — Telephone Encounter (Signed)
Faxed path reports to [facility] Pt appt 01/13/17 @ 2:00. Called pt lvm in ref to appt.

## 2017-01-06 ENCOUNTER — Ambulatory Visit (HOSPITAL_BASED_OUTPATIENT_CLINIC_OR_DEPARTMENT_OTHER): Payer: BLUE CROSS/BLUE SHIELD | Admitting: Oncology

## 2017-01-06 ENCOUNTER — Other Ambulatory Visit: Payer: Self-pay | Admitting: *Deleted

## 2017-01-06 VITALS — BP 140/91 | HR 80 | Temp 98.5°F | Resp 18 | Ht 66.0 in | Wt 160.5 lb

## 2017-01-06 DIAGNOSIS — D509 Iron deficiency anemia, unspecified: Secondary | ICD-10-CM | POA: Diagnosis not present

## 2017-01-06 DIAGNOSIS — C787 Secondary malignant neoplasm of liver and intrahepatic bile duct: Principal | ICD-10-CM

## 2017-01-06 DIAGNOSIS — G893 Neoplasm related pain (acute) (chronic): Secondary | ICD-10-CM

## 2017-01-06 DIAGNOSIS — I10 Essential (primary) hypertension: Secondary | ICD-10-CM | POA: Diagnosis not present

## 2017-01-06 DIAGNOSIS — C187 Malignant neoplasm of sigmoid colon: Secondary | ICD-10-CM

## 2017-01-06 DIAGNOSIS — C189 Malignant neoplasm of colon, unspecified: Secondary | ICD-10-CM

## 2017-01-06 MED ORDER — OXYCODONE HCL 5 MG PO TABS: ORAL_TABLET | ORAL | 0 refills | Status: DC

## 2017-01-06 NOTE — Progress Notes (Signed)
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 She had increased upper abdominal pain last week. She took 10 mg of oxycodone. She noted partial relief when she drank Magic mouthwash. No dysphagia, no difficulty with bowel or bladder function. She is working.  She is scheduled see Dr. Reynaldo Minium next week.  Objective:  Vital signs in last 24 hours:  Blood pressure (!) 140/91, pulse 80, temperature 98.5 F (36.9 C), temperature source Oral, resp. rate 18, height _0  (1.676 m), weight 160 lb 8 oz (72.8 kg), SpO2 99 %.  Resp: Lungs clear bilaterally Cardio: Regular rate and rhythm, 2/6 diastolic murmur GI: The liver edge is palpable in the subxiphoid region, mass at the mid upper abdominal wall Vascular: No leg edema  Skin: Hyperpigmentation of the palms and soles without skin breakdown   Portacath/PICC-without erythema  Lab Results:  Lab Results  Component Value Date   WBC 4.6 12/09/2016   HGB 11.5 (L) 12/09/2016   HCT 37.0 12/09/2016   MCV 81.0 12/09/2016   PLT 224 12/09/2016   NEUTROABS 2.3 12/09/2016    CMP     Component Value Date/Time   NA 142 12/09/2016 0807   K 4.1 12/09/2016 0807   CL 106 06/19/2016 1205   CO2 30 (H) 12/09/2016 0807   GLUCOSE 96 12/09/2016 0807   BUN 18.5 12/09/2016 0807   CREATININE 1.0 12/09/2016 0807   CALCIUM 10.0 12/09/2016 0807   PROT 7.9 12/09/2016 0807   ALBUMIN 3.7 12/09/2016 0807   AST 27 12/09/2016 0807   ALT 10 12/09/2016 0807   ALKPHOS 96 12/09/2016 0807   BILITOT 0.79 12/09/2016 0807   GFRNONAA 41 (L) 06/19/2016 1205   GFRAA 48 (L) 06/19/2016 1205    Lab Results  Component Value Date   CEA1 239.85 (H) 12/09/2016     Medications: I have reviewed the patient's current medications.  Assessment/Plan: 1. 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  Laparoscopic sigmoid colectomy, liver biopsy, and Port-A-Cath insertion 04/08/2015  Sigmoid colon tumor-T3 N0, liver biopsy confirmed metastatic adenocarcinoma  Chemotherapy initiated with FOLFIRI 05/08/2015, Avastin added with cycle 2  CT 07/14/2015-improvement in liver metastases  CT 10/12/2015-continued improvement in liver metastases  CT 06/11/2016-new small pleural effusions, stable hepatic metastases, stable soft tissue density at the ventral midline abdominal wall  Admission 06/16/2016 through 06/20/2016 with congestive heart failure and pneumonia  07/21/2016-biopsy of abdominal wall mass-positive for adenocarcinoma  08/12/2016-chemotherapy switch to Xeloda 2 weeks on/one-week off and Avastin every 2 weeks  09/24/2016-PET scan with new hypermetabolicomental nodules and a new hypermetabolic gastrohepatic ligament node, growth of abdominal wall mass and progression of liver metastases  Treatment switch to FOLFOX/Avastin beginning 10/14/2016  CT abdomen/pelvis 12/17/2016-enlargement of liver metastases, increased celiac node, increased size of peritoneal nodules, stable anterior abdominal wall mass  2. Congestive heart failure diagnosed in November 2017-unclear, infection related cardiomyopathy  3. Hypertension  4. History of iron deficiency anemia and beta thalassemia trait  5.  Pain secondary to metastatic colon cancer involving an abdominal wall mass and liver metastasis   Disposition:  Her overall status appears unchanged. The pain is likely related to the abdominal wall mass, liver metastases, and abdominal adenopathy. She would like to continue oxycodone for pain. She is scheduled see Dr. Reynaldo Minium next week to consider  clinical trial options and standard therapies in the setting of the NRAS and BRAF mutations.  She will return for an office visit to  discuss treatment options on 01/14/2017.  [name], MD  01/06/2017  9:26 AM

## 2017-01-11 ENCOUNTER — Telehealth: Payer: Self-pay | Admitting: Oncology

## 2017-01-11 NOTE — Telephone Encounter (Signed)
Patient bypassed scheduling on 01/06/17. Per 01/06/17, follow up appointment with Dr [name] scheduled for 01/14/17 @ 8:15 a.m. With lab and flush. Appointments scheduled and confirmed with patient.

## 2017-01-14 ENCOUNTER — Other Ambulatory Visit (HOSPITAL_BASED_OUTPATIENT_CLINIC_OR_DEPARTMENT_OTHER): Payer: BLUE CROSS/BLUE SHIELD

## 2017-01-14 ENCOUNTER — Ambulatory Visit (HOSPITAL_BASED_OUTPATIENT_CLINIC_OR_DEPARTMENT_OTHER): Payer: BLUE CROSS/BLUE SHIELD | Admitting: Oncology

## 2017-01-14 ENCOUNTER — Ambulatory Visit (HOSPITAL_BASED_OUTPATIENT_CLINIC_OR_DEPARTMENT_OTHER): Payer: BLUE CROSS/BLUE SHIELD

## 2017-01-14 VITALS — BP 143/86 | HR 73 | Temp 98.1°F | Resp 18 | Ht 66.0 in | Wt 160.2 lb

## 2017-01-14 DIAGNOSIS — C187 Malignant neoplasm of sigmoid colon: Secondary | ICD-10-CM | POA: Diagnosis not present

## 2017-01-14 DIAGNOSIS — G893 Neoplasm related pain (acute) (chronic): Secondary | ICD-10-CM

## 2017-01-14 DIAGNOSIS — Z452 Encounter for adjustment and management of vascular access device: Secondary | ICD-10-CM | POA: Diagnosis not present

## 2017-01-14 DIAGNOSIS — I10 Essential (primary) hypertension: Secondary | ICD-10-CM | POA: Diagnosis not present

## 2017-01-14 DIAGNOSIS — C189 Malignant neoplasm of colon, unspecified: Secondary | ICD-10-CM

## 2017-01-14 DIAGNOSIS — C787 Secondary malignant neoplasm of liver and intrahepatic bile duct: Secondary | ICD-10-CM | POA: Diagnosis not present

## 2017-01-14 DIAGNOSIS — D500 Iron deficiency anemia secondary to blood loss (chronic): Secondary | ICD-10-CM

## 2017-01-14 DIAGNOSIS — Z95828 Presence of other vascular implants and grafts: Secondary | ICD-10-CM

## 2017-01-14 LAB — COMPREHENSIVE METABOLIC PANEL
ALBUMIN: 2.9 g/dL — AB (ref 3.5–5.0)
ALK PHOS: 128 U/L (ref 40–150)
ALT: 9 U/L (ref 0–55)
AST: 32 U/L (ref 5–34)
Anion Gap: 9 mEq/L (ref 3–11)
BUN: 14.1 mg/dL (ref 7.0–26.0)
CALCIUM: 10 mg/dL (ref 8.4–10.4)
CHLORIDE: 104 meq/L (ref 98–109)
CO2: 28 mEq/L (ref 22–29)
Creatinine: 0.9 mg/dL (ref 0.6–1.1)
EGFR: 87 mL/min/{1.73_m2} — AB (ref >90)
Glucose: 88 mg/dl (ref 70–140)
POTASSIUM: 4.3 meq/L (ref 3.5–5.1)
Sodium: 141 mEq/L (ref 136–145)
Total Bilirubin: 0.72 mg/dL (ref 0.20–1.20)
Total Protein: 7.7 g/dL (ref 6.4–8.3)

## 2017-01-14 LAB — UA PROTEIN, DIPSTICK - CHCC: PROTEIN: NEGATIVE mg/dL

## 2017-01-14 LAB — CBC WITH DIFFERENTIAL/PLATELET
BASO%: 0.3 % (ref 0.0–2.0)
BASOS ABS: 0 10*3/uL (ref 0.0–0.1)
EOS ABS: 0.6 10*3/uL — AB (ref 0.0–0.5)
EOS%: 9.3 % — AB (ref 0.0–7.0)
HEMATOCRIT: 34.1 % — AB (ref 34.8–46.6)
HGB: 10.7 g/dL — ABNORMAL LOW (ref 11.6–15.9)
LYMPH#: 0.8 10*3/uL — AB (ref 0.9–3.3)
LYMPH%: 12.2 % — ABNORMAL LOW (ref 14.0–49.7)
MCH: 24.7 pg — AB (ref 25.1–34.0)
MCHC: 31.5 g/dL (ref 31.5–36.0)
MCV: 78.5 fL — AB (ref 79.5–101.0)
MONO#: 0.7 10*3/uL (ref 0.1–0.9)
MONO%: 10.8 % (ref 0.0–14.0)
NEUT#: 4.5 10*3/uL (ref 1.5–6.5)
NEUT%: 67.4 % (ref 38.4–76.8)
Platelets: 337 10*3/uL (ref 145–400)
RBC: 4.34 10*6/uL (ref 3.70–5.45)
RDW: 17.7 % — ABNORMAL HIGH (ref 11.2–14.5)
WBC: 6.6 10*3/uL (ref 3.9–10.3)

## 2017-01-14 LAB — CEA (IN HOUSE-CHCC): CEA (CHCC-IN HOUSE): 272.6 ng/mL — AB (ref 0.00–5.00)

## 2017-01-14 MED ORDER — SODIUM CHLORIDE 0.9 % IJ SOLN
10.0000 mL | INTRAMUSCULAR | Status: DC | PRN
  Administered 2017-01-14: 10 mL via INTRAVENOUS
  Filled 2017-01-14: qty 10

## 2017-01-14 MED ORDER — HEPARIN SOD (PORK) LOCK FLUSH 100 UNIT/ML IV SOLN
500.0000 [IU] | Freq: Once | INTRAVENOUS | Status: AC | PRN
  Administered 2017-01-14: 500 [IU] via INTRAVENOUS
  Filled 2017-01-14: qty 5

## 2017-01-14 NOTE — Patient Instructions (Signed)
Implanted Port Home Guide An implanted port is a type of central line that is placed under the skin. Central lines are used to provide IV access when treatment or nutrition needs to be given through a person's veins. Implanted ports are used for long-term IV access. An implanted port may be placed because:  You need IV medicine that would be irritating to the small veins in your hands or arms.  You need long-term IV medicines, such as antibiotics.  You need IV nutrition for a long period.  You need frequent blood draws for lab tests.  You need dialysis.  Implanted ports are usually placed in the chest area, but they can also be placed in the upper arm, the abdomen, or the leg. An implanted port has two main parts:  Reservoir. The reservoir is round and will appear as a small, raised area under your skin. The reservoir is the part where a needle is inserted to give medicines or draw blood.  Catheter. The catheter is a thin, flexible tube that extends from the reservoir. The catheter is placed into a large vein. Medicine that is inserted into the reservoir goes into the catheter and then into the vein.  How will I care for my incision site? Do not get the incision site wet. Bathe or shower as directed by your health care provider. How is my port accessed? Special steps must be taken to access the port:  Before the port is accessed, a numbing cream can be placed on the skin. This helps numb the skin over the port site.  Your health care provider uses a sterile technique to access the port. ? Your health care provider must put on a mask and sterile gloves. ? The skin over your port is cleaned carefully with an antiseptic and allowed to dry. ? The port is gently pinched between sterile gloves, and a needle is inserted into the port.  Only "non-coring" port needles should be used to access the port. Once the port is accessed, a blood return should be checked. This helps ensure that the port  is in the vein and is not clogged.  If your port needs to remain accessed for a constant infusion, a clear (transparent) bandage will be placed over the needle site. The bandage and needle will need to be changed every week, or as directed by your health care provider.  Keep the bandage covering the needle clean and dry. Do not get it wet. Follow your health care provider's instructions on how to take a shower or bath while the port is accessed.  If your port does not need to stay accessed, no bandage is needed over the port.  What is flushing? Flushing helps keep the port from getting clogged. Follow your health care provider's instructions on how and when to flush the port. Ports are usually flushed with saline solution or a medicine called heparin. The need for flushing will depend on how the port is used.  If the port is used for intermittent medicines or blood draws, the port will need to be flushed: ? After medicines have been given. ? After blood has been drawn. ? As part of routine maintenance.  If a constant infusion is running, the port may not need to be flushed.  How long will my port stay implanted? The port can stay in for as long as your health care provider thinks it is needed. When it is time for the port to come out, surgery will be   done to remove it. The procedure is similar to the one performed when the port was put in. When should I seek immediate medical care? When you have an implanted port, you should seek immediate medical care if:  You notice a bad smell coming from the incision site.  You have swelling, redness, or drainage at the incision site.  You have more swelling or pain at the port site or the surrounding area.  You have a fever that is not controlled with medicine.  This information is not intended to replace advice given to you by your health care provider. Make sure you discuss any questions you have with your health care provider. Document  Released: 07/26/2005 Document Revised: 01/01/2016 Document Reviewed: 04/02/2013 Elsevier Interactive Patient Education  2017 Elsevier Inc.  

## 2017-01-14 NOTE — Progress Notes (Signed)
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 She continues to have pain at the upper abdomen. The pain increased after the trip to [facility] yesterday. She added ibuprofen to the oxycodone and this helped. She saw Dr. Reynaldo Minium yesterday. He recommends regorafenib or Lonsurf while waiting on clinical trial options. She will not qualify for clinical trial and less the LVEF improves. She had an echocardiogram in [place] recently, but has not received the report.   Objective:  Vital signs in last 24 hours:  Blood pressure (!) 143/86, pulse 73, temperature 98.1 F (36.7 C), temperature source Oral, resp. rate 18, height 5' 6"  (1.676 m), weight 160 lb 3.2 oz (72.7 kg), SpO2 100 %.    Resp: Lungs clear bilaterally Cardio: Regular rate and rhythm GI: The liver edge is palpable in the mid right subcostal region. Mobile upper abdominal wall mass appears unchanged Vascular: No leg edema  Skin: Hyperpigmentation of the hands   Portacath/PICC-without erythema  Lab Results:  Lab Results  Component Value Date   WBC 6.6 01/14/2017   HGB 10.7 (L) 01/14/2017   HCT 34.1 (L) 01/14/2017   MCV 78.5 (L) 01/14/2017   PLT 337 01/14/2017   NEUTROABS 4.5 01/14/2017    CMP     Component Value Date/Time   NA 141 01/14/2017 0842   K 4.3 01/14/2017 0842   CL 106 06/19/2016 1205   CO2 28 01/14/2017 0842   GLUCOSE 88 01/14/2017 0842   BUN 14.1 01/14/2017 0842   CREATININE 0.9 01/14/2017 0842   CALCIUM 10.0 01/14/2017 0842   PROT 7.7 01/14/2017 0842   ALBUMIN 2.9 (L) 01/14/2017 0842   AST 32 01/14/2017 0842   ALT 9 01/14/2017 0842   ALKPHOS 128 01/14/2017 0842   BILITOT 0.72 01/14/2017 0842   GFRNONAA 41 (L) 06/19/2016 1205   GFRAA 48 (L) 06/19/2016 1205    Lab Results  Component Value Date   CEA1 272.60 (H) 01/14/2017     Medications: I have reviewed the patient's current  medications.  Assessment/Plan: 1. 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  Laparoscopic sigmoid colectomy, liver biopsy, and Port-A-Cath insertion 04/08/2015  Sigmoid colon tumor-T3 N0, liver biopsy confirmed metastatic adenocarcinoma  Chemotherapy initiated with FOLFIRI 05/08/2015, Avastin added with cycle 2  CT 07/14/2015-improvement in liver metastases  CT 10/12/2015-continued improvement in liver metastases  CT 06/11/2016-new small pleural effusions, stable hepatic metastases, stable soft tissue density at the ventral midline abdominal wall  Admission 06/16/2016 through 06/20/2016 with congestive heart failure and pneumonia  07/21/2016-biopsy of abdominal wall mass-positive for adenocarcinoma  08/12/2016-chemotherapy switch to Xeloda 2 weeks on/one-week off and Avastin every 2 weeks  09/24/2016-PET scan with new hypermetabolicomental nodules and a new hypermetabolic gastrohepatic ligament node, growth of abdominal wall mass and progression of liver metastases  Treatment switch to FOLFOX/Avastin beginning 10/14/2016  CT abdomen/pelvis 12/17/2016-enlargement of liver metastases, increased celiac node, increased size of peritoneal nodules, stable anterior abdominal wall mass  2. Congestive heart failure diagnosed in November 2017-unclear, infection related cardiomyopathy  3. Hypertension  4. History of iron deficiency anemia and beta thalassemia trait  5.  Pain secondary to metastatic colon cancer involving an abdominal wall mass and liver metastasis    Disposition:  She appears unchanged. She saw Dr. Reynaldo Minium yesterday. The plan is to proceed  with "standard" therapy until she is eligible for a clinical trial. She will obtain a copy of the most recent echocardiogram  report performed in [place].  We discussed Stivarga and Lonsurf. I recommend Lonsurf.  We reviewed the potential toxicities associated with this agent including the chance for hematologic toxicity, nausea, and diarrhea. She agrees to proceed. The plan is to begin a first cycle on 01/24/2017.  We will consider recycling FOLFIRI/Avastin if there is progression on Lonsurf, though she developed heart failure while on this regimen.  Dr.Besser Will return for an office visit 02/11/2017.   [name], MD  01/14/2017  4:41 PM

## 2017-01-18 ENCOUNTER — Other Ambulatory Visit: Payer: Self-pay | Admitting: *Deleted

## 2017-01-18 MED ORDER — TRIFLURIDINE-TIPIRACIL 20-8.19 MG PO TABS: 35.0000 mg/m2 | ORAL_TABLET | Freq: Two times a day (BID) | ORAL | 0 refills | Status: DC

## 2017-01-19 ENCOUNTER — Telehealth: Payer: Self-pay | Admitting: Oncology

## 2017-01-19 NOTE — Telephone Encounter (Signed)
Appointments scheduled and confirmed with patient, per 01/14/17 los.

## 2017-01-21 ENCOUNTER — Telehealth: Payer: Self-pay | Admitting: Pharmacist

## 2017-01-21 ENCOUNTER — Telehealth: Payer: Self-pay | Admitting: *Deleted

## 2017-01-21 DIAGNOSIS — C189 Malignant neoplasm of colon, unspecified: Secondary | ICD-10-CM

## 2017-01-21 DIAGNOSIS — C787 Secondary malignant neoplasm of liver and intrahepatic bile duct: Principal | ICD-10-CM

## 2017-01-21 MED ORDER — OXYCODONE HCL 5 MG PO TABS: ORAL_TABLET | ORAL | 0 refills | Status: DC

## 2017-01-21 NOTE — Telephone Encounter (Signed)
Message from pt requesting refill of Oxycodone. Stated she is having increased pain. Returned call, left message: Is current medication working/ does she think it needs to be adjusted?

## 2017-01-21 NOTE — Telephone Encounter (Signed)
Oral Chemotherapy Pharmacist Encounter  Last MD office note and Foundation One results showing NRAS mutation faxed to [facility] at [phone].  Oral Oncology Clinic will continue to follow.  Johny Drilling, PharmD, BCPS, BCOP 01/21/2017  3:08 PM Oral Oncology Clinic [phone]

## 2017-01-21 NOTE — Telephone Encounter (Signed)
Pt returned call, reports oxycodone is managing pain. She has to take 2 tabs more often but can sometimes get by with one. Informed her she can pick up prescription today.

## 2017-01-21 NOTE — Telephone Encounter (Signed)
Oral Chemotherapy Pharmacist Encounter  Received new prescription for Lonsurf for the treatment of metastatic colorectal cancer 01/14/17 labs reviewed, OK for treatment Current medication list in Epic assessed, no DDIs with Lonsurf identified.  Lonsurf prescription called in to [facility] on 01/18/17 by Devonne Doughty per medication order. I called [facility] at [phone] to follow-up on Lonsurf status. They do not have Lonsurf Rx on file. I provided verbal order to pharmacy.  Prior authorization submitted on CoverMyMeds Key [id number] Status is pending, additional information is required.  Oral Oncology Clinic will continue to follow.  Johny Drilling, PharmD, BCPS, BCOP 01/21/2017  2:22 PM Oral Oncology Clinic [phone]

## 2017-01-27 NOTE — Telephone Encounter (Signed)
Oral Chemotherapy Pharmacist Encounter  Prior authorization approval for Lonsurf from [facility] Effective dates: 01/21/17-01/20/18 Ref# [id number]  I called [facility] at [phone] to follow-up if status of Lonsurf prescription. Patient has a high copay (~$2000) and the pharmacy is trying to help obtain copayment assistance for patient. They have not spoken with patient but left her a message yesterday (6/20) in the afternoon with request to call them back to discuss copayment. Prescription is being filled through [facility] per insurance requirement.  Oral Oncology Clinic will continue to follow.  Johny Drilling, PharmD, BCPS, BCOP 01/27/2017  11:48 AM Oral Oncology Clinic [phone]

## 2017-02-11 ENCOUNTER — Telehealth: Payer: Self-pay | Admitting: Oncology

## 2017-02-11 ENCOUNTER — Other Ambulatory Visit: Payer: Self-pay

## 2017-02-11 ENCOUNTER — Ambulatory Visit (HOSPITAL_BASED_OUTPATIENT_CLINIC_OR_DEPARTMENT_OTHER): Payer: BLUE CROSS/BLUE SHIELD | Admitting: Oncology

## 2017-02-11 ENCOUNTER — Ambulatory Visit (HOSPITAL_BASED_OUTPATIENT_CLINIC_OR_DEPARTMENT_OTHER): Payer: BLUE CROSS/BLUE SHIELD

## 2017-02-11 ENCOUNTER — Other Ambulatory Visit (HOSPITAL_BASED_OUTPATIENT_CLINIC_OR_DEPARTMENT_OTHER): Payer: BLUE CROSS/BLUE SHIELD

## 2017-02-11 VITALS — BP 103/70 | HR 90 | Temp 98.2°F | Resp 17 | Ht 66.0 in | Wt 154.4 lb

## 2017-02-11 DIAGNOSIS — C187 Malignant neoplasm of sigmoid colon: Secondary | ICD-10-CM

## 2017-02-11 DIAGNOSIS — I10 Essential (primary) hypertension: Secondary | ICD-10-CM

## 2017-02-11 DIAGNOSIS — C189 Malignant neoplasm of colon, unspecified: Secondary | ICD-10-CM

## 2017-02-11 DIAGNOSIS — C787 Secondary malignant neoplasm of liver and intrahepatic bile duct: Secondary | ICD-10-CM

## 2017-02-11 DIAGNOSIS — D509 Iron deficiency anemia, unspecified: Secondary | ICD-10-CM | POA: Diagnosis not present

## 2017-02-11 DIAGNOSIS — G893 Neoplasm related pain (acute) (chronic): Secondary | ICD-10-CM

## 2017-02-11 DIAGNOSIS — Z452 Encounter for adjustment and management of vascular access device: Secondary | ICD-10-CM

## 2017-02-11 DIAGNOSIS — D500 Iron deficiency anemia secondary to blood loss (chronic): Secondary | ICD-10-CM

## 2017-02-11 DIAGNOSIS — Z95828 Presence of other vascular implants and grafts: Secondary | ICD-10-CM

## 2017-02-11 LAB — COMPREHENSIVE METABOLIC PANEL WITH GFR
ALT: 11 U/L (ref 0–55)
AST: 45 U/L — ABNORMAL HIGH (ref 5–34)
Albumin: 2.5 g/dL — ABNORMAL LOW (ref 3.5–5.0)
Alkaline Phosphatase: 124 U/L (ref 40–150)
Anion Gap: 13 meq/L — ABNORMAL HIGH (ref 3–11)
BUN: 18.6 mg/dL (ref 7.0–26.0)
CO2: 29 meq/L (ref 22–29)
Calcium: 9.8 mg/dL (ref 8.4–10.4)
Chloride: 99 meq/L (ref 98–109)
Creatinine: 1.1 mg/dL (ref 0.6–1.1)
EGFR: 68 ml/min/1.73 m2 — ABNORMAL LOW
Glucose: 106 mg/dL (ref 70–140)
Potassium: 4.2 meq/L (ref 3.5–5.1)
Sodium: 141 meq/L (ref 136–145)
Total Bilirubin: 0.66 mg/dL (ref 0.20–1.20)
Total Protein: 7.4 g/dL (ref 6.4–8.3)

## 2017-02-11 LAB — CBC WITH DIFFERENTIAL/PLATELET
BASO%: 0.2 % (ref 0.0–2.0)
Basophils Absolute: 0 10e3/uL (ref 0.0–0.1)
EOS%: 4.6 % (ref 0.0–7.0)
Eosinophils Absolute: 0.5 10e3/uL (ref 0.0–0.5)
HCT: 33.3 % — ABNORMAL LOW (ref 34.8–46.6)
HGB: 9.9 g/dL — ABNORMAL LOW (ref 11.6–15.9)
LYMPH%: 8.4 % — ABNORMAL LOW (ref 14.0–49.7)
MCH: 22.7 pg — ABNORMAL LOW (ref 25.1–34.0)
MCHC: 29.7 g/dL — ABNORMAL LOW (ref 31.5–36.0)
MCV: 76.4 fL — ABNORMAL LOW (ref 79.5–101.0)
MONO#: 1.5 10e3/uL — ABNORMAL HIGH (ref 0.1–0.9)
MONO%: 13.9 % (ref 0.0–14.0)
NEUT#: 7.6 10e3/uL — ABNORMAL HIGH (ref 1.5–6.5)
NEUT%: 72.9 % (ref 38.4–76.8)
Platelets: 446 10e3/uL — ABNORMAL HIGH (ref 145–400)
RBC: 4.36 10e6/uL (ref 3.70–5.45)
RDW: 18.2 % — ABNORMAL HIGH (ref 11.2–14.5)
WBC: 10.4 10e3/uL — ABNORMAL HIGH (ref 3.9–10.3)
lymph#: 0.9 10e3/uL (ref 0.9–3.3)

## 2017-02-11 LAB — CEA (IN HOUSE-CHCC): CEA (CHCC-In House): 312.26 ng/mL — ABNORMAL HIGH (ref 0.00–5.00)

## 2017-02-11 LAB — UA PROTEIN, DIPSTICK - CHCC: Protein, ur: 30 mg/dL

## 2017-02-11 MED ORDER — OXYCODONE HCL 5 MG PO TABS: ORAL_TABLET | ORAL | 0 refills | Status: DC

## 2017-02-11 MED ORDER — SODIUM CHLORIDE 0.9 % IJ SOLN
10.0000 mL | INTRAMUSCULAR | Status: DC | PRN
  Administered 2017-02-11: 10 mL via INTRAVENOUS
  Filled 2017-02-11: qty 10

## 2017-02-11 MED ORDER — HEPARIN SOD (PORK) LOCK FLUSH 100 UNIT/ML IV SOLN
500.0000 [IU] | Freq: Once | INTRAVENOUS | Status: AC | PRN
  Administered 2017-02-11: 500 [IU] via INTRAVENOUS
  Filled 2017-02-11: qty 5

## 2017-02-11 MED ORDER — HYDROMORPHONE HCL 4 MG PO TABS: 4.0000 mg | ORAL_TABLET | ORAL | 0 refills | Status: DC | PRN

## 2017-02-11 MED ORDER — ONDANSETRON HCL 4 MG PO TABS: 4.0000 mg | ORAL_TABLET | Freq: Three times a day (TID) | ORAL | 2 refills | Status: AC | PRN

## 2017-02-11 NOTE — Telephone Encounter (Signed)
Oral Chemotherapy Pharmacist Encounter  I met with patient and husband in exam room about Lonsuf copayment and acquisition. Patient states she has not received any communication from [employer]. Patient has decided to NOT start Lonsurf and will try infusional FOLFIRI at this time.  No further needs from [facility] identified at this time. Oral Oncology Clinic will sign off. Please let us know if we can be of assistance in the future.  Johny Drilling, PharmD, BCPS, BCOP 02/11/2017  11:39 AM Oral Oncology Clinic [phone]

## 2017-02-11 NOTE — Telephone Encounter (Signed)
No appt scheduled per 7/6 los due to capped days. - message sent to [facility]

## 2017-02-11 NOTE — Progress Notes (Signed)
DISCONTINUE OFF PATHWAY REGIMEN - Colorectal     A cycle is every 14 days:     Oxaliplatin        Dose Mod: None     Leucovorin        Dose Mod: None     5-Fluorouracil        Dose Mod: None     5-Fluorouracil        Dose Mod: None     Bevacizumab        Dose Mod: None  **Always confirm dose/schedule in your pharmacy ordering system**    REASON: Disease Progression PRIOR TREATMENT: [id number]: mFOLFOX 6 + Bevacizumab q14 Days TREATMENT RESPONSE: Progressive Disease (PD)  START OFF PATHWAY REGIMEN - Colorectal   [id number]:FOLFIRI + Bevacizumab (q14d):   A cycle is every 14 days:     Irinotecan      Leucovorin      5-Fluorouracil      5-Fluorouracil      Bevacizumab   **Always confirm dose/schedule in your pharmacy ordering system**    Patient Characteristics: Metastatic Colorectal, Second Line, KRAS Mutation Positive/Unknown, BRAF Mutation Positive, Bevacizumab Eligible Current evidence of distant metastases? Yes AJCC T Category: Staged < 8th Ed. AJCC N Category: Staged < 8th Ed. AJCC M Category: Staged < 8th Ed. AJCC 8 Stage Grouping: Staged < 8th Ed. BRAF Mutation Status: Mutation Positive KRAS/NRAS Mutation Status: Mutation Positive Line of therapy: Second Line Would you be surprised if this patient died  in the next year? I would NOT be surprised if this patient died in the next year Intent of Therapy: Non-Curative / Palliative Intent, Discussed with Patient

## 2017-02-11 NOTE — Progress Notes (Signed)
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 She has not started Lonsurf secondary to insurance issues. Her insurance requires a high co-pay. She continues to have abdominal pain per the pain has progressed. She takes approximately 4 oxycodone tablets per day and supplements this with ibuprofen. Ibuprofen helps. She has intermittent constipation, relieved with MiraLAX. She continues to work 3 days per week. Her husband is driving her.  Objective:  Vital signs in last 24 hours:  Blood pressure 103/70, pulse 90, temperature 98.2 F (36.8 C), temperature source Oral, resp. rate 17, height _0  (1.676 m), weight 154 lb 6.4 oz (70 kg), SpO2 100 %.    HEENT: No thrush Resp: Lungs clear bilaterally Cardio: Regular rate and rhythm, the neck veins are not distended GI: The liver edge is palpable in the mid abdomen with associated tenderness. There is an abdominal wall mass and firm masslike fullness superior to the dominant mass Vascular: No leg edema  Skin: Hyperpigmentation of the palms and soles   Portacath/PICC-without erythema  Lab Results:  Lab Results  Component Value Date   WBC 10.4 (H) 02/11/2017   HGB 9.9 (L) 02/11/2017   HCT 33.3 (L) 02/11/2017   MCV 76.4 (L) 02/11/2017   PLT 446 (H) 02/11/2017   NEUTROABS 7.6 (H) 02/11/2017    CMP     Component Value Date/Time   NA 141 02/11/2017 0925   K 4.2 02/11/2017 0925   CL 106 06/19/2016 1205   CO2 29 02/11/2017 0925   GLUCOSE 106 02/11/2017 0925   BUN 18.6 02/11/2017 0925   CREATININE 1.1 02/11/2017 0925   CALCIUM 9.8 02/11/2017 0925   PROT 7.4 02/11/2017 0925   ALBUMIN 2.5 (L) 02/11/2017 0925   AST 45 (H) 02/11/2017 0925   ALT 11 02/11/2017 0925   ALKPHOS 124 02/11/2017 0925   BILITOT 0.66 02/11/2017 0925   GFRNONAA 41 (L) 06/19/2016 1205   GFRAA 48 (L) 06/19/2016 1205    Lab Results  Component Value Date   CEA1 312.26 (H)  02/11/2017     Medications: I have reviewed the patient's current medications.  Assessment/Plan: 1. 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  Laparoscopic sigmoid colectomy, liver biopsy, and Port-A-Cath insertion 04/08/2015  Sigmoid colon tumor-T3 N0, liver biopsy confirmed metastatic adenocarcinoma  Chemotherapy initiated with FOLFIRI 05/08/2015, Avastin added with cycle 2  CT 07/14/2015-improvement in liver metastases  CT 10/12/2015-continued improvement in liver metastases  CT 06/11/2016-new small pleural effusions, stable hepatic metastases, stable soft tissue density at the ventral midline abdominal wall  Admission 06/16/2016 through 06/20/2016 with congestive heart failure and pneumonia  07/21/2016-biopsy of abdominal wall mass-positive for adenocarcinoma  08/12/2016-chemotherapy switch to Xeloda 2 weeks on/one-week off and Avastin every 2 weeks  09/24/2016-PET scan with new hypermetabolicomental nodules and a new hypermetabolic gastrohepatic ligament node, growth of abdominal wall mass and progression of liver metastases  Treatment switch to FOLFOX/Avastin beginning 10/14/2016  CT abdomen/pelvis 12/17/2016-enlargement of liver metastases, increased celiac node, increased size of peritoneal nodules, stable anterior abdominal wall mass  2. Congestive heart failure diagnosed in November 2017-unclear etiology, infection related cardiomyopathy?  3. Hypertension  4. History of iron deficiency anemia and beta thalassemia trait  5. 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 I discussed treatment options with her again today. Systemic treatment options  are limited with the RAS mutation. The plan was to begin Corinda Gubler saw her last month, but she was not able to obtain this agent secondary to cost.  Her performance status has declined and she has increased pain. I favor repeat treatment with FOLFIRI/Avastin given the small chance of a clinical response with Lonsurf. She developed heart failure while on the FOLFIRI/Avastin regimen, but the CHF is not felt to be drug related. She understands the potential risk of worsening heart failure with FOLFIRI/Avastin and would like to proceed.  I will refer her to the CHF clinic here. We will obtain a baseline echocardiogram. I discussed the case with cardiology today.  She will use Dilaudid as needed for pain not relieved with oxycodone. We discussed adding a long-acting narcotic. 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 She does not want to begin a long-acting narcotic today.  She will return for an office visit prior to beginning FOLFIRI next week.  40 minutes were spent with the patient today. The majority of the time was used for counseling and coordination of care.    [name], MD  02/11/2017  1:26 PM

## 2017-02-13 ENCOUNTER — Telehealth: Payer: Self-pay | Admitting: Oncology

## 2017-02-13 ENCOUNTER — Other Ambulatory Visit: Payer: Self-pay | Admitting: Oncology

## 2017-02-13 NOTE — Telephone Encounter (Signed)
Lvm advising appts 7/12 @ 10.15. Advised pt to collect other appts then. Unable to override md schedule on 7/26. Msg to [facility] to add appt.

## 2017-02-14 ENCOUNTER — Ambulatory Visit (HOSPITAL_COMMUNITY)
Admission: RE | Admit: 2017-02-14 | Discharge: 2017-02-14 | Disposition: A | Discharge status: Home / Self Care | Payer: BLUE CROSS/BLUE SHIELD | Source: Ambulatory Visit | Attending: Oncology | Admitting: Oncology

## 2017-02-14 DIAGNOSIS — C189 Malignant neoplasm of colon, unspecified: Secondary | ICD-10-CM

## 2017-02-14 DIAGNOSIS — I420 Dilated cardiomyopathy: Secondary | ICD-10-CM | POA: Diagnosis not present

## 2017-02-14 DIAGNOSIS — I313 Pericardial effusion (noninflammatory): Secondary | ICD-10-CM | POA: Insufficient documentation

## 2017-02-14 DIAGNOSIS — I5030 Unspecified diastolic (congestive) heart failure: Secondary | ICD-10-CM | POA: Diagnosis not present

## 2017-02-14 DIAGNOSIS — I080 Rheumatic disorders of both mitral and aortic valves: Secondary | ICD-10-CM | POA: Insufficient documentation

## 2017-02-14 DIAGNOSIS — C787 Secondary malignant neoplasm of liver and intrahepatic bile duct: Secondary | ICD-10-CM | POA: Diagnosis present

## 2017-02-14 NOTE — Progress Notes (Signed)
  Echocardiogram 2D Echocardiogram has been performed.  Christina [name] M 02/14/2017, 1:47 PM

## 2017-02-15 ENCOUNTER — Ambulatory Visit (HOSPITAL_COMMUNITY)
Admission: RE | Admit: 2017-02-15 | Discharge: 2017-02-15 | Disposition: A | Discharge status: Home / Self Care | Payer: BLUE CROSS/BLUE SHIELD | Source: Ambulatory Visit | Attending: Internal Medicine | Admitting: Internal Medicine

## 2017-02-15 ENCOUNTER — Encounter (HOSPITAL_COMMUNITY): Payer: Self-pay | Admitting: Internal Medicine

## 2017-02-15 VITALS — BP 90/56 | HR 79 | Wt 153.2 lb

## 2017-02-15 DIAGNOSIS — C189 Malignant neoplasm of colon, unspecified: Secondary | ICD-10-CM | POA: Diagnosis not present

## 2017-02-15 DIAGNOSIS — I429 Cardiomyopathy, unspecified: Secondary | ICD-10-CM | POA: Diagnosis not present

## 2017-02-15 DIAGNOSIS — D563 Thalassemia minor: Secondary | ICD-10-CM | POA: Insufficient documentation

## 2017-02-15 DIAGNOSIS — I420 Dilated cardiomyopathy: Secondary | ICD-10-CM

## 2017-02-15 DIAGNOSIS — Z888 Allergy status to other drugs, medicaments and biological substances status: Secondary | ICD-10-CM | POA: Diagnosis not present

## 2017-02-15 DIAGNOSIS — Z91011 Allergy to milk products: Secondary | ICD-10-CM | POA: Insufficient documentation

## 2017-02-15 DIAGNOSIS — N809 Endometriosis, unspecified: Secondary | ICD-10-CM | POA: Diagnosis not present

## 2017-02-15 DIAGNOSIS — I110 Hypertensive heart disease with heart failure: Secondary | ICD-10-CM | POA: Diagnosis not present

## 2017-02-15 DIAGNOSIS — I5022 Chronic systolic (congestive) heart failure: Secondary | ICD-10-CM | POA: Diagnosis present

## 2017-02-15 DIAGNOSIS — C787 Secondary malignant neoplasm of liver and intrahepatic bile duct: Secondary | ICD-10-CM | POA: Diagnosis not present

## 2017-02-15 DIAGNOSIS — Z800 Family history of malignant neoplasm of digestive organs: Secondary | ICD-10-CM | POA: Diagnosis not present

## 2017-02-15 DIAGNOSIS — Z832 Family history of diseases of the blood and blood-forming organs and certain disorders involving the immune mechanism: Secondary | ICD-10-CM | POA: Insufficient documentation

## 2017-02-15 DIAGNOSIS — Z9889 Other specified postprocedural states: Secondary | ICD-10-CM | POA: Insufficient documentation

## 2017-02-15 NOTE — Progress Notes (Signed)
Advanced Heart Failure Clinic Note   Referring Physician: Dr. [name]  Primary Care: No PCP  Primary Cardiologist: New to Dr. 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 Also with history of systolic CHF (EF 27-25% in Nov. 2017), now 35-40% by Echo 7/18. Has history of HTN, thalassemia trait and endometriosis. Referred by Dr. [name] for further evaluation of cardiomyopathy prior to impending chemotherapy.   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  Laparoscopic sigmoid colectomy, liver biopsy, and Port-A-Cath insertion 04/08/2015  Sigmoid colon tumor-T3 N0, liver biopsy confirmed metastatic adenocarcinoma  Chemotherapy initiated with FOLFIRI 05/08/2015, Avastin added with cycle 2  CT 07/14/2015-improvement in liver metastases  CT 10/12/2015-continued improvement in liver metastases  CT 06/11/2016-new small pleural effusions, stable hepatic metastases, stable soft tissue density at the ventral midline abdominal wall  Admission 06/16/2016 through 06/20/2016 with congestive heart failure and pneumonia  07/21/2016-biopsy of abdominal wall mass-positive for adenocarcinoma  08/12/2016-chemotherapy switch to Xeloda 2 weeks on/one-week off and Avastin every 2 weeks  09/24/2016-PET scan with new hypermetabolicomental nodules and a new hypermetabolic gastrohepatic ligament node, growth of abdominal wall mass and progression of liver metastases  Treatment switch to FOLFOX/Avastin beginning 10/14/2016  CT abdomen/pelvis 12/17/2016-enlargement of liver metastases, increased celiac node, increased size of peritoneal nodules, stable anterior abdominal wall mass   Initial  treatment for colon CA was with FOLFIRI and Avastin at end of 2016 with good response.  She presented to [facility] in 06/2016 with presumed CAP. Treated with abx.  Echo was done during that admission, showed EF 20-25%. No prior cardiac history. She was treated with IV Lasix. It was felt that her cardiomyopathy was related to viral vs.chemotherapy vs. HTN. LifeVest was placed at that time. She discontinued after 2 weeks.   In 12/17 found to have abdominal wall mass positive for AdenoCA. Treated with Xeloda and Avastin  In 2/18, found to have progression of disease and switched to FOLFOX/avastin but disease has continue to progress. Tomorrow will be going back to FOXFORI    From HF perspective she has done well. Has been carvedilol 12.5 bid. Losartan 25 daily. Eplerenone 25 daily. Echo 02/14/17 EF 35-40%. Blood pressure has been running low. Dizzy in the morning. Takes lasix 53m bid. Weight down 8 pounds. No orthopnea, PND no edema. No CP. [place]     Review of Systems: [y] = yes, _0  = no   General: Weight gain _1 ; Weight loss [ y]; Anorexia _2 ; Fatigue [Blue.Reese]; Fever _3 ; Chills _4 ; Weakness _5   Cardiac: Chest pain/pressure _6 ; Resting SOB _7 ; Exertional SOB _8 ; Orthopnea _9 ; Pedal Edema _10 ; Palpitations _11 ; Syncope _12 ; Presyncope _13 ; Paroxysmal nocturnal dyspnea_14   Pulmonary: Cough _15 ; Wheezing_16 ; Hemoptysis_17 ; Sputum _18 ; Snoring _19   GI: Vomiting_20 ; Dysphagia_21 ; Melena_22 ; Hematochezia _23 ; Heartburn_24 ; Abdominal pain [ y]; Constipation _25 ; Diarrhea _26 ; BRBPR _27   GU: Hematuria_28 ; Dysuria _29 ; Nocturia_30   Vascular: Pain in legs with walking _31 ; Pain in feet with lying flat _32 ; Non-healing sores _33 ; Stroke _34 ; TIA _35 ; Slurred speech _36 ;  Neuro: Headaches_0 ; Vertigo_1 ; Seizures_2 ; Paresthesias_3 ;Blurred vision _4 ; Diplopia _5 ; Vision changes _6   Ortho/Skin: Arthritis _7 ; Joint pain _8 ; Muscle pain _9 ; Joint swelling _10 ; Back Pain _11 ; Rash  _12   Psych: Depression_13 ; Anxiety_14   Heme: Bleeding problems _15 ; Clotting disorders _16 ; Anemia _17   Endocrine: Diabetes _18 ; Thyroid dysfunction_19    Past Medical History:  Diagnosis Date  . Endometriosis   . Hypertension   . met colon ca to liver dx'd 03/2015  . Thalassanemia     Current Outpatient Prescriptions  Medication Sig Dispense Refill  . carvedilol (COREG) 6.25 MG tablet Take 12.5 mg by mouth 2 (two) times daily with a meal.     . eplerenone (INSPRA) 25 MG tablet Take 25 mg by mouth daily.    . furosemide (LASIX) 40 MG tablet Take 40 mg by mouth 2 (two) times daily.    Marland Kitchen lidocaine-prilocaine (EMLA) cream Apply to port site one hour prior to use. Do not rub in. Cover with plastic. 30 g 2  . losartan (COZAAR) 25 MG tablet Take 1 tablet (25 mg total) by mouth daily. 30 tablet 0  . magic mouthwash SOLN Equal parts Nystatin, Diphenhydramine and Maalax four times a day as needed, 5-10 ml swish and spit 240 mL 0  . ondansetron (ZOFRAN) 4 MG tablet Take 1 tablet (4 mg total) by mouth every 8 (eight) hours as needed for nausea or vomiting. 30 tablet 2  . oxyCODONE (OXY IR/ROXICODONE) 5 MG immediate release tablet May take 1-2 tablets every 4 hours as needed 100 tablet 0  . potassium chloride SA (K-DUR,KLOR-CON) 20 MEQ tablet Take 20 mEq by mouth 2 (two) times daily.    Marland Kitchen HYDROmorphone (DILAUDID) 4 MG tablet Take 1 tablet (4 mg total) by mouth every 4 (four) hours as needed for severe pain. (Patient not taking: Reported on 02/15/2017) 60 tablet 0  . prochlorperazine (COMPAZINE) 10 MG tablet Take 1 tablet (10 mg total) by mouth every 6 (six) hours as needed for nausea or vomiting. (Patient not taking: Reported on 01/14/2017) 60 tablet 1  . trifluridine-tipiracil (LONSURF) 20-8.19 MG tablet Take 3 tablets (60 mg of trifluridine total) by mouth 2 (two) times daily after a meal. 1 hr after AM & PM meals on days 1-5, 8-12. Repeat every 28day (Patient not taking: Reported on 02/15/2017) 36 tablet  0   No current facility-administered medications for this encounter.     Allergies  Allergen Reactions  . Lactose Intolerance (Gi) Other (See Comments)    Diarrhea, Bloated, Abdominal pain.  . Lasix [Furosemide] Nausea And Vomiting    Problems with IV only  . Lorazepam Other (See Comments)    Pt couldn't walk straight, put her in a daze  . Milk-Related Compounds Diarrhea      Social History   Social History  . Marital status: Married    Spouse name: N/A  . Number of children: N/A  . Years of education: N/A   Occupational History  . Internal Medicine doctor     Works in [place] [employer]  . Smoking status: Never Smoker  . Smokeless tobacco: Never Used  . Alcohol use Yes     Comment: Once every four months.   . Drug use: No  . Sexual activity: Not on file   Other Topics Concern  . Not on file   Social History Narrative  Married, husband [name] (married X 2 years)   No children   IM Physician in [place]      Family History  Problem Relation Age of Onset  . Anesthesia problems Cousin 22       maternal cousin, colon cancer   . Clotting disorder Maternal Grandmother     Vitals:   02/15/17 1059  BP: (!) 90/56  Pulse: 79  SpO2: 100%  Weight: 153 lb 4 oz (69.5 kg)     PHYSICAL EXAM: General: Sitting on exam table.  No respiratory difficulty HEENT: normal Neck: supple. no JVD. +portacath Carotids 2+ bilat; no bruits. No lymphadenopathy or thyromegaly appreciated. Cor: PMI nondisplaced. Regular rate & rhythm. No rubs, gallops or murmurs. Lungs: clear Abdomen: soft,, nondistended. +tenderness over epigastric mass No hepatosplenomegaly. No bruits or masses. Good bowel sounds. Extremities: no cyanosis, clubbing, rash, edema Neuro: alert & oriented x 3, cranial nerves grossly intact. moves all 4 extremities w/o difficulty. Affect pleasant.    ASSESSMENT & PLAN:  1. 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  EF 20-25% - Echo 7/18 EF 35-40% (reviewed personally) - EF improving. NYHA I-II - OK to proceed with Folfox as it seems to be best option for malignancy but it may have been implicated in initial cardiomyopathy.Will follow echos closely. - Looks dry. Will cut lasix to 40 daily but take extra as needed particularly surrounding treatments. If BP remains low will cut carvedilol to 6.25 bid.   -Will repeat echo after next 2 cycles (in 4-5 weeks)   2. Metastatic Colon CA -- As above. Ok to proceed with Baron Sane, MD  7:59 PM

## 2017-02-15 NOTE — Patient Instructions (Signed)
Your physician recommends that you schedule a follow-up appointment in: 5 weeks with echocardiogram

## 2017-02-17 ENCOUNTER — Ambulatory Visit: Payer: Self-pay

## 2017-02-17 ENCOUNTER — Ambulatory Visit (HOSPITAL_BASED_OUTPATIENT_CLINIC_OR_DEPARTMENT_OTHER): Payer: BLUE CROSS/BLUE SHIELD

## 2017-02-17 ENCOUNTER — Ambulatory Visit (HOSPITAL_COMMUNITY): Payer: Self-pay | Admitting: Internal Medicine

## 2017-02-17 ENCOUNTER — Telehealth: Payer: Self-pay | Admitting: Oncology

## 2017-02-17 ENCOUNTER — Ambulatory Visit (HOSPITAL_BASED_OUTPATIENT_CLINIC_OR_DEPARTMENT_OTHER): Payer: BLUE CROSS/BLUE SHIELD | Admitting: Oncology

## 2017-02-17 VITALS — BP 124/75 | HR 77 | Temp 97.6°F | Resp 20

## 2017-02-17 VITALS — BP 100/58 | HR 84 | Temp 98.4°F | Resp 20 | Ht 66.0 in | Wt 153.2 lb

## 2017-02-17 DIAGNOSIS — G893 Neoplasm related pain (acute) (chronic): Secondary | ICD-10-CM | POA: Diagnosis not present

## 2017-02-17 DIAGNOSIS — Z5112 Encounter for antineoplastic immunotherapy: Secondary | ICD-10-CM | POA: Diagnosis not present

## 2017-02-17 DIAGNOSIS — C787 Secondary malignant neoplasm of liver and intrahepatic bile duct: Secondary | ICD-10-CM

## 2017-02-17 DIAGNOSIS — C189 Malignant neoplasm of colon, unspecified: Secondary | ICD-10-CM

## 2017-02-17 DIAGNOSIS — C187 Malignant neoplasm of sigmoid colon: Secondary | ICD-10-CM | POA: Diagnosis not present

## 2017-02-17 DIAGNOSIS — Z5111 Encounter for antineoplastic chemotherapy: Secondary | ICD-10-CM

## 2017-02-17 DIAGNOSIS — I10 Essential (primary) hypertension: Secondary | ICD-10-CM | POA: Diagnosis not present

## 2017-02-17 MED ORDER — ATROPINE SULFATE 1 MG/ML IJ SOLN
INTRAMUSCULAR | Status: AC
  Filled 2017-02-17: qty 1

## 2017-02-17 MED ORDER — ATROPINE SULFATE 1 MG/ML IJ SOLN
0.5000 mg | Freq: Once | INTRAMUSCULAR | Status: AC | PRN
  Administered 2017-02-17: 0.5 mg via INTRAVENOUS

## 2017-02-17 MED ORDER — LEUCOVORIN CALCIUM INJECTION 350 MG
400.0000 mg/m2 | Freq: Once | INTRAVENOUS | Status: AC
  Administered 2017-02-17: 724 mg via INTRAVENOUS
  Filled 2017-02-17: qty 36.2

## 2017-02-17 MED ORDER — SODIUM CHLORIDE 0.9 % IV SOLN
Freq: Once | INTRAVENOUS | Status: AC
  Administered 2017-02-17: 12:00:00 via INTRAVENOUS
  Filled 2017-02-17: qty 5

## 2017-02-17 MED ORDER — SODIUM CHLORIDE 0.9 % IV SOLN
Freq: Once | INTRAVENOUS | Status: AC
  Administered 2017-02-17: 12:00:00 via INTRAVENOUS

## 2017-02-17 MED ORDER — SODIUM CHLORIDE 0.9 % IV SOLN
2400.0000 mg/m2 | INTRAVENOUS | Status: DC
  Administered 2017-02-17: 4350 mg via INTRAVENOUS
  Filled 2017-02-17: qty 87

## 2017-02-17 MED ORDER — PALONOSETRON HCL INJECTION 0.25 MG/5ML
INTRAVENOUS | Status: AC
Start: 2017-02-17 — End: ?
  Filled 2017-02-17: qty 5

## 2017-02-17 MED ORDER — IRINOTECAN HCL CHEMO INJECTION 100 MG/5ML
135.0000 mg/m2 | Freq: Once | INTRAVENOUS | Status: AC
  Administered 2017-02-17: 240 mg via INTRAVENOUS
  Filled 2017-02-17: qty 10

## 2017-02-17 MED ORDER — OXYCODONE HCL 5 MG PO TABS
ORAL_TABLET | ORAL | 0 refills | Status: DC
Start: 2017-02-17 — End: 2017-03-03

## 2017-02-17 MED ORDER — PALONOSETRON HCL INJECTION 0.25 MG/5ML
0.2500 mg | Freq: Once | INTRAVENOUS | Status: AC
  Administered 2017-02-17: 0.25 mg via INTRAVENOUS

## 2017-02-17 MED ORDER — SODIUM CHLORIDE 0.9 % IV SOLN
5.0000 mg/kg | Freq: Once | INTRAVENOUS | Status: AC
  Administered 2017-02-17: 350 mg via INTRAVENOUS
  Filled 2017-02-17: qty 14

## 2017-02-17 NOTE — Patient Instructions (Signed)
[place] Discharge Instructions for Patients Receiving Chemotherapy  Today you received the following chemotherapy agents: Avastin,Leucovorin, Irinotecan, Adrucil   To help prevent nausea and vomiting after your treatment, we encourage you to take your nausea medication.  Take it as often as prescribed.     If you develop nausea and vomiting that is not controlled by your nausea medication, call the clinic. If it is after clinic hours your family physician or the after hours number for the clinic or go to the Emergency Department.   BELOW ARE SYMPTOMS THAT SHOULD BE REPORTED IMMEDIATELY:  *FEVER GREATER THAN 100.5 F  *CHILLS WITH OR WITHOUT FEVER  NAUSEA AND VOMITING THAT IS NOT CONTROLLED WITH YOUR NAUSEA MEDICATION  *UNUSUAL SHORTNESS OF BREATH  *UNUSUAL BRUISING OR BLEEDING  TENDERNESS IN MOUTH AND THROAT WITH OR WITHOUT PRESENCE OF ULCERS  *URINARY PROBLEMS  *BOWEL PROBLEMS  UNUSUAL RASH Items with * indicate a potential emergency and should be followed up as soon as possible.

## 2017-02-17 NOTE — Telephone Encounter (Signed)
Appointments complete per 7/12 los. Patient will get out in infusion.

## 2017-02-17 NOTE — Progress Notes (Signed)
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 She saw Dr.Bensimohn earlier this week. He adjusted the medical regimen for CHF. A repeat echocardiogram found the LVEF at 35-40 percent. She continues to have abdominal pain. Dilaudid caused "cramping". She takes 10 mg of oxycodone 2-3 times per day. She is working 2 hours daily and does not take pain medication while working.  Her appetite is poor. She presents today to resume FOLFIRI/Avastin.   Objective:  Vital signs in last 24 hours:  Blood pressure (!) 100/58, pulse 84, temperature 98.4 F (36.9 C), temperature source Oral, resp. rate 20, height 5' 6"  (1.676 m), weight 153 lb 3.2 oz (69.5 kg), SpO2 97 %.    Resp:  Lungs clear bilaterally Cardio:  Regular rate and rhythm, 2/6 diastolic murmur GI:  Abdominal wall/liver mass in the upper abdomen, the abdomen is soft Vascular:  No leg edema   Portacath/PICC-without erythema  Lab Results:  Lab Results  Component Value Date   WBC 10.4 (H) 02/11/2017   HGB 9.9 (L) 02/11/2017   HCT 33.3 (L) 02/11/2017   MCV 76.4 (L) 02/11/2017   PLT 446 (H) 02/11/2017   NEUTROABS 7.6 (H) 02/11/2017    CMP     Component Value Date/Time   NA 141 02/11/2017 0925   K 4.2 02/11/2017 0925   CL 106 06/19/2016 1205   CO2 29 02/11/2017 0925   GLUCOSE 106 02/11/2017 0925   BUN 18.6 02/11/2017 0925   CREATININE 1.1 02/11/2017 0925   CALCIUM 9.8 02/11/2017 0925   PROT 7.4 02/11/2017 0925   ALBUMIN 2.5 (L) 02/11/2017 0925   AST 45 (H) 02/11/2017 0925   ALT 11 02/11/2017 0925   ALKPHOS 124 02/11/2017 0925   BILITOT 0.66 02/11/2017 0925   GFRNONAA 41 (L) 06/19/2016 1205   GFRAA 48 (L) 06/19/2016 1205    Lab Results  Component Value Date   CEA1 312.26 (H) 02/11/2017     Medications: I have reviewed the patient's current medications.  Assessment/plan:  1. 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  Laparoscopic sigmoid colectomy, liver biopsy, and Port-A-Cath insertion 04/08/2015  Sigmoid colon tumor-T3 N0, liver biopsy confirmed metastatic adenocarcinoma  Chemotherapy initiated with FOLFIRI 05/08/2015, Avastin added with cycle 2  CT 07/14/2015-improvement in liver metastases  CT 10/12/2015-continued improvement in liver metastases  CT 06/11/2016-new small pleural effusions, stable hepatic metastases, stable soft tissue density at the ventral midline abdominal wall  Admission 06/16/2016 through 06/20/2016 with congestive heart failure and pneumonia  07/21/2016-biopsy of abdominal wall mass-positive for adenocarcinoma  08/12/2016-chemotherapy switch to Xeloda 2 weeks on/one-week off and Avastin every 2 weeks  09/24/2016-PET scan with new hypermetabolicomental nodules and a new hypermetabolic gastrohepatic ligament node, growth of abdominal wall mass and progression of liver metastases  Treatment switch to FOLFOX/Avastin beginning 10/14/2016  CT abdomen/pelvis 12/17/2016-enlargement of liver metastases, increased celiac node, increased size of peritoneal nodules, stable anterior abdominal wall mass  FOLFIRI/Avastin beginning 02/17/2017   2. Congestive heart failure diagnosed in November 2017-unclear etiology, infection related cardiomyopathy? improved LVEF on an echocardiogram 02/14/2017  3. Hypertension  4. History of iron deficiency anemia and beta thalassemia trait  5. Pain secondary to metastatic colon cancer involving an abdominal wall mass and liver metastasisssessment/Plan:   Disposition:  She appears unchanged. The plan is to  resume treatment with FOLFIRI/Avastin today. She will contact us for nausea/vomiting or new symptoms following chemotherapy. She will return for an  office visit and the next cycle of chemotherapy in 2 weeks.  She will follow-up with Dr. Jeffie Pollock for management of congestive heart failure.  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    [name], MD  02/17/2017  11:52 AM

## 2017-02-19 ENCOUNTER — Encounter (HOSPITAL_COMMUNITY): Payer: Self-pay

## 2017-02-19 ENCOUNTER — Emergency Department (HOSPITAL_COMMUNITY): Payer: BLUE CROSS/BLUE SHIELD

## 2017-02-19 ENCOUNTER — Emergency Department (HOSPITAL_COMMUNITY)
Admission: EM | Admit: 2017-02-19 | Discharge: 2017-02-19 | Disposition: A | Discharge status: Home / Self Care | Payer: BLUE CROSS/BLUE SHIELD | Attending: Emergency Medicine | Admitting: Emergency Medicine

## 2017-02-19 ENCOUNTER — Other Ambulatory Visit: Payer: Self-pay

## 2017-02-19 DIAGNOSIS — E860 Dehydration: Secondary | ICD-10-CM | POA: Diagnosis not present

## 2017-02-19 DIAGNOSIS — R197 Diarrhea, unspecified: Secondary | ICD-10-CM | POA: Diagnosis not present

## 2017-02-19 DIAGNOSIS — R531 Weakness: Secondary | ICD-10-CM | POA: Diagnosis present

## 2017-02-19 DIAGNOSIS — I110 Hypertensive heart disease with heart failure: Secondary | ICD-10-CM | POA: Diagnosis not present

## 2017-02-19 DIAGNOSIS — Z79899 Other long term (current) drug therapy: Secondary | ICD-10-CM | POA: Insufficient documentation

## 2017-02-19 DIAGNOSIS — I5042 Chronic combined systolic (congestive) and diastolic (congestive) heart failure: Secondary | ICD-10-CM | POA: Diagnosis not present

## 2017-02-19 LAB — BASIC METABOLIC PANEL
Anion gap: 12 (ref 5–15)
BUN: 58 mg/dL — AB (ref 6–20)
CHLORIDE: 103 mmol/L (ref 101–111)
CO2: 26 mmol/L (ref 22–32)
Calcium: 9.1 mg/dL (ref 8.9–10.3)
Creatinine, Ser: 1.57 mg/dL — ABNORMAL HIGH (ref 0.44–1.00)
GFR, EST AFRICAN AMERICAN: 41 mL/min — AB (ref >60)
GFR, EST NON AFRICAN AMERICAN: 36 mL/min — AB (ref >60)
Glucose, Bld: 109 mg/dL — ABNORMAL HIGH (ref 65–99)
POTASSIUM: 4.7 mmol/L (ref 3.5–5.1)
SODIUM: 141 mmol/L (ref 135–145)

## 2017-02-19 LAB — CBC
HEMATOCRIT: 29.4 % — AB (ref 36.0–46.0)
Hemoglobin: 9.2 g/dL — ABNORMAL LOW (ref 12.0–15.0)
MCH: 22.2 pg — ABNORMAL LOW (ref 26.0–34.0)
MCHC: 31.3 g/dL (ref 30.0–36.0)
MCV: 71 fL — ABNORMAL LOW (ref 78.0–100.0)
PLATELETS: 574 10*3/uL — AB (ref 150–400)
RBC: 4.14 MIL/uL (ref 3.87–5.11)
RDW: 17.8 % — AB (ref 11.5–15.5)
WBC: 10.6 10*3/uL — AB (ref 4.0–10.5)

## 2017-02-19 LAB — HEPATIC FUNCTION PANEL
ALBUMIN: 2.3 g/dL — AB (ref 3.5–5.0)
ALK PHOS: 105 U/L (ref 38–126)
ALT: 17 U/L (ref 14–54)
AST: 39 U/L (ref 15–41)
Bilirubin, Direct: 0.2 mg/dL (ref 0.1–0.5)
Indirect Bilirubin: 0.6 mg/dL (ref 0.3–0.9)
Total Bilirubin: 0.8 mg/dL (ref 0.3–1.2)
Total Protein: 6.7 g/dL (ref 6.5–8.1)

## 2017-02-19 LAB — URINALYSIS, ROUTINE W REFLEX MICROSCOPIC
Bilirubin Urine: NEGATIVE
GLUCOSE, UA: NEGATIVE mg/dL
KETONES UR: NEGATIVE mg/dL
Leukocytes, UA: NEGATIVE
Nitrite: NEGATIVE
PH: 5 (ref 5.0–8.0)
Protein, ur: NEGATIVE mg/dL
Specific Gravity, Urine: 1.015 (ref 1.005–1.030)

## 2017-02-19 LAB — TROPONIN I: Troponin I: <0.03 ng/mL (ref <0.03)

## 2017-02-19 MED ORDER — SODIUM CHLORIDE 0.9 % IV BOLUS (SEPSIS)
1000.0000 mL | Freq: Once | INTRAVENOUS | Status: AC
  Administered 2017-02-19: 1000 mL via INTRAVENOUS

## 2017-02-19 MED ORDER — SODIUM CHLORIDE 0.9 % IV BOLUS (SEPSIS)
500.0000 mL | Freq: Once | INTRAVENOUS | Status: AC
Start: 2017-02-19 — End: 2017-02-19
  Administered 2017-02-19: 500 mL via INTRAVENOUS

## 2017-02-19 MED ORDER — HEPARIN SOD (PORK) LOCK FLUSH 100 UNIT/ML IV SOLN: 500.0000 [IU] | Freq: Once | INTRAVENOUS | Status: DC | PRN

## 2017-02-19 MED ORDER — SODIUM CHLORIDE 0.9% FLUSH: 10.0000 mL | INTRAVENOUS | Status: DC | PRN

## 2017-02-19 MED ORDER — HEPARIN SOD (PORK) LOCK FLUSH 100 UNIT/ML IV SOLN
500.0000 [IU] | Freq: Once | INTRAVENOUS | Status: AC
  Administered 2017-02-19: 500 [IU]
  Filled 2017-02-19: qty 5

## 2017-02-19 MED ORDER — SODIUM CHLORIDE 0.9 % IV SOLN
INTRAVENOUS | Status: DC
  Administered 2017-02-19: 16:00:00 via INTRAVENOUS

## 2017-02-19 NOTE — Discharge Instructions (Signed)
Your creatinine today was 1.56. Follow-up with your doctor for this

## 2017-02-19 NOTE — ED Notes (Signed)
Bed: HX50 Expected date:  Expected time:  Means of arrival:  Comments: Cancer center-low HgB

## 2017-02-19 NOTE — ED Triage Notes (Signed)
Patient coming from cancer center with c/o generalized weakness. Pt was at the cancer center for pump stop. pt state she been feeling weak and nausea.pt she denies any vomiting.

## 2017-02-19 NOTE — ED Notes (Signed)
From cancer center-patient with possible low Hgb

## 2017-02-19 NOTE — Progress Notes (Signed)
Patient reports to [facility] today for chemotherapy pump removal.  Patient has c/o increased abdominal pain, nausea, weakness and fatigue. On-call physician for [facility] called, Dr. Lindi Adie who advised patient to report to ED for further evaluation of issues.  Discontinued pump and report called to ED RN.  Patient transferred to ED with no immediate signs or symptoms of distress. Vital signs stable.

## 2017-02-19 NOTE — ED Provider Notes (Signed)
[facility] DEPT Provider Note   CSN: [id number] Arrival date & time: 02/19/17  1437     History   Chief Complaint Chief Complaint  Patient presents with  . generalized weakness    HPI Christina [name] is a 56 y.o. female.  56 year old female with history of metastatic colon cancer presents with increased weakness and concern is she may have anemia. She's had some diarrhea but denies any fever or chills. Denies any new abdominal discomfort. No emesis noted. Lasts received chemotherapy this week. No cough or congestion. Was at the [facility] today to have her pump disconnected and told the staff there that she felt weak and was then referred here for further management.      Past Medical History:  Diagnosis Date  . Endometriosis   . Hypertension   . met colon ca to liver dx'd 03/2015  . Thalassanemia     Patient Active Problem List   Diagnosis Date Noted  . Goals of care, counseling/discussion 07/22/2016  . Congestive dilated cardiomyopathy ([place]) 06/17/2016  . Community acquired pneumonia of left lower lobe of lung ([place]) 06/16/2016  . Acute respiratory failure with hypoxia ([place]) 06/16/2016  . Acute on chronic combined systolic and diastolic congestive heart failure ([place]) 06/16/2016  . Port catheter in place 12/04/2015  . Chemotherapy induced neutropenia ([place]) 08/07/2015  . Rectal pain 07/02/2015  . Metastatic colon cancer to liver ([place]) 04/08/2015  . Hypertension   . Thalassanemia   . Nausea & vomiting 04/06/2015  .  Pelvic mass - 9cm - ?fibroid vs drop metastasis 04/06/2015  . Hypokalemia 04/06/2015  . Iron deficiency anemia due to chronic blood loss 04/06/2015    Past Surgical History:  Procedure Laterality Date  . COLON RESECTION N/A 04/08/2015   Procedure: LAPAROSCOPIC  RESECTION OF PART OF  SIGMOID COLON;  Surgeon: Michael Boston, MD;  Location: [facility];  Service: General;  Laterality: N/A;  . DIAGNOSTIC LAPAROSCOPY     Endometriosis  . LAPAROSCOPIC  SIGMOID COLECTOMY  04/08/2015   for colorectal cancer  . LIVER BIOPSY N/A 04/08/2015   Procedure: CORE NEEDLE LIVER BIOPSY;  Surgeon: Michael Boston, MD;  Location: [facility];  Service: General;  Laterality: N/A;  . MYOMECTOMY     Gyn in [place]  . PORTACATH PLACEMENT N/A 04/08/2015   Procedure: INSERTION PORT-A-CATH;  Surgeon: Michael Boston, MD;  Location: [facility];  Service: General;  Laterality: N/A;    OB History    No data available       Home Medications    Prior to Admission medications   Medication Sig Start Date End Date Taking? Authorizing Provider  carvedilol (COREG) 6.25 MG tablet Take 12.5 mg by mouth 2 (two) times daily with a meal.     [provider]  eplerenone (INSPRA) 25 MG tablet Take 25 mg by mouth daily.    [provider]  furosemide (LASIX) 40 MG tablet Take 40 mg by mouth 2 (two) times daily.    [provider]  HYDROmorphone (DILAUDID) 4 MG tablet Take 1 tablet (4 mg total) by mouth every 4 (four) hours as needed for severe pain. Patient not taking: Reported on 02/15/2017 02/11/17   Ladell Pier, MD  lidocaine-prilocaine (EMLA) cream Apply to port site one hour prior to use. Do not rub in. Cover with plastic. 12/16/16   Ladell Pier, MD  losartan (COZAAR) 25 MG tablet Take 1 tablet (25 mg total) by mouth daily. 06/20/16   Murlean Iba, MD  magic mouthwash SOLN Equal parts Nystatin, Diphenhydramine and Maalax four times a day as needed, 5-10 ml swish and spit 10/28/16   Ladell Pier, MD  ondansetron (ZOFRAN) 4 MG tablet Take 1 tablet (4 mg total) by mouth every 8 (eight) hours as needed for nausea or vomiting. 02/11/17   Ladell Pier, MD  oxyCODONE (OXY IR/ROXICODONE) 5 MG immediate release tablet May take 1-2 tablets every 4 hours as needed 02/17/17   Ladell Pier, MD  potassium chloride SA (K-DUR,KLOR-CON) 20 MEQ tablet Take 20 mEq by mouth 2 (two) times daily.    [provider]  prochlorperazine (COMPAZINE)  10 MG tablet Take 1 tablet (10 mg total) by mouth every 6 (six) hours as needed for nausea or vomiting. Patient not taking: Reported on 01/14/2017 10/11/16   Ladell Pier, MD  trifluridine-tipiracil (LONSURF) 20-8.19 MG tablet Take 3 tablets (60 mg of trifluridine total) by mouth 2 (two) times daily after a meal. 1 hr after AM & PM meals on days 1-5, 8-12. Repeat every 28day Patient not taking: Reported on 02/15/2017 01/18/17   Ladell Pier, MD    Family History Family History  Problem Relation Age of Onset  . Anesthesia problems Cousin 56       maternal cousin, colon cancer   . Clotting disorder Maternal Grandmother     Social History Social History  Substance Use Topics  . Smoking status: Never Smoker  . Smokeless tobacco: Never Used  . Alcohol use Yes     Comment: Once every four months.      Allergies   Lactose intolerance (gi); Lasix [furosemide]; Lorazepam; and Milk-related compounds   Review of Systems Review of Systems  All other systems reviewed and are negative.    Physical Exam Updated Vital Signs Ht 1.676 m (_0 )   Wt 69.4 kg (153 lb)   BMI 24.69 kg/m   Physical Exam  Constitutional: She is oriented to person, place, and time. She appears well-developed and well-nourished.  Non-toxic appearance. No distress.  HENT:  Head: Normocephalic and atraumatic.  Eyes: Pupils are equal, round, and reactive to light. Conjunctivae, EOM and lids are normal.  Neck: Normal range of motion. Neck supple. No tracheal deviation present. No thyroid mass present.  Cardiovascular: Normal rate, regular rhythm and normal heart sounds.  Exam reveals no gallop.   No murmur heard. Pulmonary/Chest: Effort normal and breath sounds normal. No stridor. No respiratory distress. She has no decreased breath sounds. She has no wheezes. She has no rhonchi. She has no rales.  Abdominal: Soft. Normal appearance and bowel sounds are normal. She exhibits no distension. There is no  tenderness. There is no rebound and no CVA tenderness.  Musculoskeletal: Normal range of motion. She exhibits no edema or tenderness.  Neurological: She is alert and oriented to person, place, and time. She has normal strength. No cranial nerve deficit or sensory deficit. GCS eye subscore is 4. GCS verbal subscore is 5. GCS motor subscore is 6.  Skin: Skin is warm and dry. No abrasion and no rash noted.  Psychiatric: She has a normal mood and affect. Her speech is normal and behavior is normal.  Nursing note and vitals reviewed.    ED Treatments / Results  Labs (all labs ordered are listed, but only abnormal results are displayed) Labs Reviewed  BASIC METABOLIC PANEL - Abnormal; Notable for the following:       Result Value   Glucose, Bld 109 (*)  BUN 58 (*)    Creatinine, Ser 1.57 (*)    GFR calc non Af Amer 36 (*)    GFR calc Af Amer 41 (*)    All other components within normal limits  CBC - Abnormal; Notable for the following:    WBC 10.6 (*)    Hemoglobin 9.2 (*)    HCT 29.4 (*)    MCV 71.0 (*)    MCH 22.2 (*)    RDW 17.8 (*)    Platelets 574 (*)    All other components within normal limits  TROPONIN I  HEPATIC FUNCTION PANEL  URINALYSIS, ROUTINE W REFLEX MICROSCOPIC    EKG  EKG Interpretation  Date/Time:  Saturday February 19 2017 15:52:34 EDT Ventricular Rate:  66 PR Interval:    QRS Duration: 83 QT Interval:  402 QTC Calculation: 422 R Axis:   28 Text Interpretation:  Sinus rhythm Repol abnrm suggests ischemia, anterolateral Confirmed by Lacretia Leigh ([id number]) on 02/19/2017 4:04:08 PM       Radiology No results found.  Procedures Procedures (including critical care time)  Medications Ordered in ED Medications  0.9 %  sodium chloride infusion (not administered)  sodium chloride 0.9 % bolus 500 mL (not administered)  sodium chloride 0.9 % bolus 1,000 mL (not administered)     Initial Impression / Assessment and Plan / ED Course  I have reviewed the  triage vital signs and the nursing notes.  Pertinent labs & imaging results that were available during my care of the patient were reviewed by me and considered in my medical decision making (see chart for details).     Patient given IV fluids and feels better. Is able to ambulate. Does have evidence of renal insufficiency and will follow-up with the cancer Center for this. She will hold her Lasix at this time. Patient feels comfortable to go home  Final Clinical Impressions(s) / ED Diagnoses   Final diagnoses:  None    New Prescriptions New Prescriptions   No medications on file     Lacretia Leigh, MD 02/19/17 1756

## 2017-02-19 NOTE — Addendum Note (Signed)
Addended by: Lenox Ponds E on: 02/19/2017 02:25 PM   Modules accepted: Orders

## 2017-02-21 ENCOUNTER — Emergency Department (HOSPITAL_COMMUNITY): Payer: BLUE CROSS/BLUE SHIELD

## 2017-02-21 ENCOUNTER — Encounter (HOSPITAL_COMMUNITY): Payer: Self-pay | Admitting: Emergency Medicine

## 2017-02-21 ENCOUNTER — Emergency Department (HOSPITAL_COMMUNITY)
Admission: EM | Admit: 2017-02-21 | Discharge: 2017-02-21 | Disposition: A | Discharge status: Home / Self Care | Payer: BLUE CROSS/BLUE SHIELD | Attending: Emergency Medicine | Admitting: Emergency Medicine

## 2017-02-21 DIAGNOSIS — C189 Malignant neoplasm of colon, unspecified: Secondary | ICD-10-CM | POA: Insufficient documentation

## 2017-02-21 DIAGNOSIS — N189 Chronic kidney disease, unspecified: Secondary | ICD-10-CM | POA: Diagnosis not present

## 2017-02-21 DIAGNOSIS — C801 Malignant (primary) neoplasm, unspecified: Secondary | ICD-10-CM | POA: Diagnosis not present

## 2017-02-21 DIAGNOSIS — C787 Secondary malignant neoplasm of liver and intrahepatic bile duct: Secondary | ICD-10-CM | POA: Diagnosis not present

## 2017-02-21 DIAGNOSIS — C786 Secondary malignant neoplasm of retroperitoneum and peritoneum: Secondary | ICD-10-CM | POA: Insufficient documentation

## 2017-02-21 DIAGNOSIS — I129 Hypertensive chronic kidney disease with stage 1 through stage 4 chronic kidney disease, or unspecified chronic kidney disease: Secondary | ICD-10-CM | POA: Insufficient documentation

## 2017-02-21 DIAGNOSIS — Z79899 Other long term (current) drug therapy: Secondary | ICD-10-CM | POA: Insufficient documentation

## 2017-02-21 DIAGNOSIS — R109 Unspecified abdominal pain: Secondary | ICD-10-CM | POA: Diagnosis present

## 2017-02-21 LAB — CBC WITH DIFFERENTIAL/PLATELET
Basophils Absolute: 0 10*3/uL (ref 0.0–0.1)
Basophils Relative: 0 %
EOS ABS: 0.5 10*3/uL (ref 0.0–0.7)
EOS PCT: 5 %
HCT: 30.9 % — ABNORMAL LOW (ref 36.0–46.0)
Hemoglobin: 9.6 g/dL — ABNORMAL LOW (ref 12.0–15.0)
LYMPHS ABS: 1 10*3/uL (ref 0.7–4.0)
LYMPHS PCT: 10 %
MCH: 22 pg — AB (ref 26.0–34.0)
MCHC: 31.1 g/dL (ref 30.0–36.0)
MCV: 70.7 fL — AB (ref 78.0–100.0)
MONO ABS: 0.1 10*3/uL (ref 0.1–1.0)
MONOS PCT: 2 %
Neutro Abs: 8 10*3/uL — ABNORMAL HIGH (ref 1.7–7.7)
Neutrophils Relative %: 83 %
PLATELETS: 411 10*3/uL — AB (ref 150–400)
RBC: 4.37 MIL/uL (ref 3.87–5.11)
RDW: 18.1 % — ABNORMAL HIGH (ref 11.5–15.5)
WBC: 9.7 10*3/uL (ref 4.0–10.5)

## 2017-02-21 LAB — LIPASE, BLOOD: Lipase: <10 U/L — ABNORMAL LOW (ref 11–51)

## 2017-02-21 LAB — COMPREHENSIVE METABOLIC PANEL
ALK PHOS: 99 U/L (ref 38–126)
ALT: 13 U/L — AB (ref 14–54)
AST: 32 U/L (ref 15–41)
Albumin: 2.3 g/dL — ABNORMAL LOW (ref 3.5–5.0)
Anion gap: 10 (ref 5–15)
BUN: 29 mg/dL — AB (ref 6–20)
CALCIUM: 8.6 mg/dL — AB (ref 8.9–10.3)
CHLORIDE: 104 mmol/L (ref 101–111)
CO2: 26 mmol/L (ref 22–32)
CREATININE: 0.97 mg/dL (ref 0.44–1.00)
GFR calc non Af Amer: >60 mL/min (ref >60)
GLUCOSE: 98 mg/dL (ref 65–99)
Potassium: 3.5 mmol/L (ref 3.5–5.1)
SODIUM: 140 mmol/L (ref 135–145)
Total Bilirubin: 1.2 mg/dL (ref 0.3–1.2)
Total Protein: 6.7 g/dL (ref 6.5–8.1)

## 2017-02-21 LAB — URINALYSIS, ROUTINE W REFLEX MICROSCOPIC
BILIRUBIN URINE: NEGATIVE
Glucose, UA: NEGATIVE mg/dL
Ketones, ur: NEGATIVE mg/dL
Leukocytes, UA: NEGATIVE
Nitrite: NEGATIVE
PROTEIN: 30 mg/dL — AB
SPECIFIC GRAVITY, URINE: 1.015 (ref 1.005–1.030)
pH: 5 (ref 5.0–8.0)

## 2017-02-21 MED ORDER — SODIUM CHLORIDE 0.9 % IV SOLN
INTRAVENOUS | Status: DC
  Administered 2017-02-21: 16:00:00 via INTRAVENOUS

## 2017-02-21 MED ORDER — HEPARIN SOD (PORK) LOCK FLUSH 100 UNIT/ML IV SOLN
500.0000 [IU] | Freq: Once | INTRAVENOUS | Status: AC
  Administered 2017-02-21: 500 [IU]
  Filled 2017-02-21: qty 5

## 2017-02-21 MED ORDER — MORPHINE SULFATE (PF) 4 MG/ML IV SOLN
4.0000 mg | INTRAVENOUS | Status: DC | PRN
  Administered 2017-02-21: 4 mg via INTRAVENOUS
  Filled 2017-02-21: qty 1

## 2017-02-21 MED ORDER — MORPHINE SULFATE (PF) 4 MG/ML IV SOLN
4.0000 mg | Freq: Once | INTRAVENOUS | Status: AC
  Administered 2017-02-21: 4 mg via INTRAVENOUS
  Filled 2017-02-21: qty 1

## 2017-02-21 MED ORDER — IOPAMIDOL (ISOVUE-300) INJECTION 61%
100.0000 mL | Freq: Once | INTRAVENOUS | Status: AC | PRN
  Administered 2017-02-21: 100 mL via INTRAVENOUS

## 2017-02-21 MED ORDER — ONDANSETRON HCL 4 MG/2ML IJ SOLN
4.0000 mg | Freq: Once | INTRAMUSCULAR | Status: AC
  Administered 2017-02-21: 4 mg via INTRAVENOUS
  Filled 2017-02-21: qty 2

## 2017-02-21 MED ORDER — MORPHINE SULFATE 15 MG PO TABS: 15.0000 mg | ORAL_TABLET | ORAL | 0 refills | Status: DC | PRN

## 2017-02-21 MED ORDER — SODIUM CHLORIDE 0.9 % IV BOLUS (SEPSIS)
1000.0000 mL | Freq: Once | INTRAVENOUS | Status: AC
  Administered 2017-02-21: 1000 mL via INTRAVENOUS

## 2017-02-21 MED ORDER — FENTANYL CITRATE (PF) 100 MCG/2ML IJ SOLN
75.0000 ug | Freq: Once | INTRAMUSCULAR | Status: AC
  Administered 2017-02-21: 75 ug via INTRAVENOUS
  Filled 2017-02-21: qty 2

## 2017-02-21 MED ORDER — IOPAMIDOL (ISOVUE-300) INJECTION 61%
INTRAVENOUS | Status: AC
  Filled 2017-02-21: qty 100

## 2017-02-21 NOTE — ED Provider Notes (Signed)
  Physical Exam  BP 135/75 (BP Location: Left Arm)   Pulse 91   Temp 97.7 F (36.5 C) (Oral)   Resp 18   Ht 5\' 6"  (1.676 m)   Wt 69.4 kg (153 lb)   SpO2 98%   BMI 24.69 kg/m   Physical Exam  ED Course  Procedures  MDM Care assumed at 5pm. 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 Sign out pending CT ab/pel.   5:57 PM CT showed progression of disease with mets to liver and lung and peritoneal carcinomatosis. Patient's pain is controlled. I called Dr. Inda Merlin, who is covering Dr. Learta Codding. He states that Dr. Learta Codding is out of town this week and patient should just continue oxycodone at home and make an appointment with him next week.    Drenda Freeze, MD 02/21/17 Carollee Massed

## 2017-02-21 NOTE — ED Triage Notes (Signed)
Pt states that she has colon CA and was seen here on Saturday and given fluid. States she was told she was in acute renal failure and given fluids but she feels as if she may be getting fluid overloaded again because she has swelling in the R side of her abdomen. Alert and oriented.

## 2017-02-21 NOTE — Discharge Instructions (Addendum)
Continue taking oxycodone. You may take 2 at a time to help control the pain.   If oxycodone doesn't control the pain, try morphine for break through pain.   Your cancer has progressed and you have some ascites.   Call Dr. Carin Hock office for appointment next Monday.   Return to ER if you have fever, uncontrolled pain, vomiting, dehydration

## 2017-02-21 NOTE — ED Provider Notes (Signed)
[facility] DEPT Provider Note   CSN: [id number] Arrival date & time: 02/21/17  1324     History   Chief Complaint Chief Complaint  Patient presents with  . Abdominal Pain  . Chemo Card    HPI Christina [name] is a 56 y.o. female.  HPI Pt is getting chemotherapy for colon cancer.  On Saturday, she was weak and could not walk after getting her chemo treatment.  This was her first treatment in over a year, previously stopped because of cardiac complications.  Pt was seen in the ED.  She was given IV fluids and felt better.  Labs notable for renal insufficiency.  Over the weekend she started feeling worse.  Sunday she could not get out of bed.  Her abdomen started to swell.  No vomiting.  She has nausea.  No diarrhea.  No fevers.  She is having pain in her abdomen that she attributes to the cancer and "fluid"  Past Medical History:  Diagnosis Date  . Endometriosis   . Hypertension   . met colon ca to liver dx'd 03/2015  . Thalassanemia     Patient Active Problem List   Diagnosis Date Noted  . Goals of care, counseling/discussion 07/22/2016  . Congestive dilated cardiomyopathy ([place]) 06/17/2016  . Community acquired pneumonia of left lower lobe of lung ([place]) 06/16/2016  . Acute respiratory failure with hypoxia ([place]) 06/16/2016  . Acute on chronic combined systolic and diastolic congestive heart failure ([place]) 06/16/2016  . Port catheter in place 12/04/2015  . Chemotherapy induced neutropenia ([place]) 08/07/2015  . Rectal pain 07/02/2015  . Metastatic colon cancer to liver ([place]) 04/08/2015  . Hypertension   . Thalassanemia   . Nausea & vomiting 04/06/2015  .  Pelvic mass - 9cm - ?fibroid vs drop metastasis 04/06/2015  . Hypokalemia 04/06/2015  . Iron deficiency anemia due to chronic blood loss 04/06/2015    Past Surgical History:  Procedure Laterality Date  . COLON RESECTION N/A 04/08/2015   Procedure: LAPAROSCOPIC  RESECTION OF PART OF  SIGMOID COLON;  Surgeon: Michael Boston, MD;  Location: [facility];  Service: General;  Laterality: N/A;  . DIAGNOSTIC LAPAROSCOPY     Endometriosis  . LAPAROSCOPIC SIGMOID COLECTOMY  04/08/2015   for colorectal cancer  . LIVER BIOPSY N/A 04/08/2015   Procedure: CORE NEEDLE LIVER BIOPSY;  Surgeon: Michael Boston, MD;  Location: [facility];  Service: General;  Laterality: N/A;  . MYOMECTOMY     Gyn in [place]  . PORTACATH PLACEMENT N/A 04/08/2015   Procedure: INSERTION PORT-A-CATH;  Surgeon: Michael Boston, MD;  Location: [facility];  Service: General;  Laterality: N/A;    OB History    No data available       Home Medications    Prior to Admission medications   Medication Sig Start Date End Date Taking? Authorizing Provider  carvedilol (COREG) 6.25 MG tablet Take 6.25 mg by mouth 2 (two) times daily with a meal.    Yes [provider]  eplerenone (INSPRA) 25 MG tablet Take 25 mg by mouth at bedtime.    Yes [provider]  furosemide (LASIX) 40 MG tablet Take 40 mg by mouth daily.    Yes [provider]  ibuprofen (ADVIL,MOTRIN) 200 MG tablet Take 400 mg by mouth 2 (two) times daily as needed for moderate pain.   Yes [provider]  lidocaine-prilocaine (EMLA) cream Apply to port site one hour prior to use. Do not rub in. Cover with plastic. 12/16/16  Yes Ladell Pier, MD  losartan (COZAAR) 25 MG tablet Take 1 tablet (25 mg total) by mouth daily. 06/20/16  Yes Johnson, Clanford L, MD  magic mouthwash SOLN Equal parts Nystatin, Diphenhydramine and Maalax four times a day as needed, 5-10 ml swish and spit Patient taking differently: Equal parts Nystatin, Diphenhydramine and Maalax four times a day as needed mouth sores, 5-10 ml swish and spit 10/28/16  Yes Ladell Pier, MD  ondansetron (ZOFRAN) 4 MG tablet Take 1 tablet (4 mg total) by mouth every 8 (eight) hours as needed for nausea or vomiting. 02/11/17  Yes Ladell Pier, MD  oxyCODONE (OXY IR/ROXICODONE) 5 MG immediate release tablet  May take 1-2 tablets every 4 hours as needed Patient taking differently: May take 1-2 tablets every 4 hours as needed pain 02/17/17  Yes Ladell Pier, MD  potassium chloride SA (K-DUR,KLOR-CON) 20 MEQ tablet Take 20 mEq by mouth 2 (two) times daily.   Yes [provider]  prochlorperazine (COMPAZINE) 10 MG tablet Take 1 tablet (10 mg total) by mouth every 6 (six) hours as needed for nausea or vomiting. 10/11/16  Yes Ladell Pier, MD  HYDROmorphone (DILAUDID) 4 MG tablet Take 1 tablet (4 mg total) by mouth every 4 (four) hours as needed for severe pain. Patient not taking: Reported on 02/15/2017 02/11/17   Ladell Pier, MD  trifluridine-tipiracil (LONSURF) 20-8.19 MG tablet Take 3 tablets (60 mg of trifluridine total) by mouth 2 (two) times daily after a meal. 1 hr after AM & PM meals on days 1-5, 8-12. Repeat every 28day Patient not taking: Reported on 02/15/2017 01/18/17   Ladell Pier, MD    Family History Family History  Problem Relation Age of Onset  . Anesthesia problems Cousin 76       maternal cousin, colon cancer   . Clotting disorder Maternal Grandmother     Social History Social History  Substance Use Topics  . Smoking status: Never Smoker  . Smokeless tobacco: Never Used  . Alcohol use Yes     Comment: Once every four months.      Allergies   Lactose intolerance (gi); Dilaudid [hydromorphone hcl]; Lasix [furosemide]; and Lorazepam   Review of Systems Review of Systems   Physical Exam Updated Vital Signs BP 128/77 (BP Location: Left Arm)   Pulse 90   Temp 97.7 F (36.5 C) (Oral)   Resp 18   Ht 1.676 m (5' 6" )   Wt 69.4 kg (153 lb)   SpO2 98%   BMI 24.69 kg/m   Physical Exam  Constitutional: She appears well-developed and well-nourished. No distress.  HENT:  Head: Normocephalic and atraumatic.  Right Ear: External ear normal.  Left Ear: External ear normal.  Eyes: Conjunctivae are normal. Right eye exhibits no discharge. Left eye  exhibits no discharge. No scleral icterus.  Neck: Neck supple. No tracheal deviation present.  Cardiovascular: Normal rate, regular rhythm and intact distal pulses.   Pulmonary/Chest: Effort normal and breath sounds normal. No stridor. No respiratory distress. She has no wheezes. She has no rales.  Abdominal: Soft. Bowel sounds are normal. She exhibits no distension and no ascites. There is tenderness in the right upper quadrant and epigastric area. There is no rebound and no guarding.  Musculoskeletal: She exhibits no edema or tenderness.  Neurological: She is alert. She has normal strength. No cranial nerve deficit (no facial droop, extraocular movements intact, no slurred speech) or sensory deficit. She exhibits normal muscle tone.  She displays no seizure activity. Coordination normal.  Skin: Skin is warm and dry. No rash noted.  Psychiatric: She has a normal mood and affect.  Nursing note and vitals reviewed.    ED Treatments / Results  Labs (all labs ordered are listed, but only abnormal results are displayed) Labs Reviewed  COMPREHENSIVE METABOLIC PANEL - Abnormal; Notable for the following:       Result Value   BUN 29 (*)    Calcium 8.6 (*)    Albumin 2.3 (*)    ALT 13 (*)    All other components within normal limits  LIPASE, BLOOD - Abnormal; Notable for the following:    Lipase <10 (*)    All other components within normal limits  CBC WITH DIFFERENTIAL/PLATELET - Abnormal; Notable for the following:    Hemoglobin 9.6 (*)    HCT 30.9 (*)    MCV 70.7 (*)    MCH 22.0 (*)    RDW 18.1 (*)    Platelets 411 (*)    Neutro Abs 8.0 (*)    All other components within normal limits  URINALYSIS, ROUTINE W REFLEX MICROSCOPIC - Abnormal; Notable for the following:    APPearance CLOUDY (*)    Hgb urine dipstick SMALL (*)    Protein, ur 30 (*)    Bacteria, UA MANY (*)    Squamous Epithelial / LPF 0-5 (*)    All other components within normal limits      Radiology pending Procedures Procedures (including critical care time)  Medications Ordered in ED Medications  sodium chloride 0.9 % bolus 1,000 mL (0 mLs Intravenous Stopped 02/21/17 1555)    And  0.9 %  sodium chloride infusion ( Intravenous New Bag/Given 02/21/17 1617)  morphine 4 MG/ML injection 4 mg (4 mg Intravenous Given 02/21/17 1640)  ondansetron (ZOFRAN) injection 4 mg (4 mg Intravenous Given 02/21/17 1507)  fentaNYL (SUBLIMAZE) injection 75 mcg (75 mcg Intravenous Given 02/21/17 1511)     Initial Impression / Assessment and Plan / ED Course  I have reviewed the triage vital signs and the nursing notes.  Pertinent labs & imaging results that were available during my care of the patient were reviewed by me and considered in my medical decision making (see chart for details).  Clinical Course as of Feb 22 1648  Mon Feb 21, 2017  1450 Previous imaging tests reviewed. In May of this year the patient had a CT abdomen and pelvis that showed metastatic lesions in the liver as well as pelvic and abdominal lymphadenopathy.  [QI]  2979 Pt is concerned because her abdominal pain is severe.  She does not feel like she can function.   Will ct to evaluate for any acute complications but I suspect this is related to her malignancy  [JK]  1649 Dispo pending ct scan.  Case turned over to Dr Darl Householder  [JK]    Clinical Course User Index [JK] Dorie Rank, MD     Final Clinical Impressions(s) / ED Diagnoses   Final diagnoses:  Colon cancer metastasized to liver [facility])      Dorie Rank, MD 02/21/17 1649

## 2017-02-21 NOTE — ED Notes (Signed)
Gave pt a drink and pt tolerated it well.

## 2017-02-25 ENCOUNTER — Telehealth: Payer: Self-pay

## 2017-02-25 NOTE — Telephone Encounter (Signed)
Pt went to ED 7/14 and 7/16, CT AP showed progression on 7/16. Pt is requesting an appt with Dr [name] next week per ED instructions. Copy of CT and this note place with Dr Gearldine Shown RN. inbasket sent for week of 7/23, routine status.

## 2017-02-26 ENCOUNTER — Other Ambulatory Visit: Payer: Self-pay | Admitting: Oncology

## 2017-02-28 ENCOUNTER — Telehealth: Payer: Self-pay | Admitting: *Deleted

## 2017-02-28 NOTE — Telephone Encounter (Signed)
Called pt. Per Dr. [name], expected progression on CT. Just started treatment 2 weeks ago. Will see pt at 4PM today or tomorrow.  Pt declined office visit. She reports pain is under better control today. She will wait until 7/26 appt. Pt is currently taking Oxycodone 10 mg Q4hrs. Occasionally has to take an additional tablet.  She wants MD to know she closed her clinic so she can focus on her treatment.

## 2017-03-03 ENCOUNTER — Ambulatory Visit (HOSPITAL_BASED_OUTPATIENT_CLINIC_OR_DEPARTMENT_OTHER): Payer: BLUE CROSS/BLUE SHIELD | Admitting: Oncology

## 2017-03-03 ENCOUNTER — Ambulatory Visit (HOSPITAL_COMMUNITY)
Admission: RE | Admit: 2017-03-03 | Discharge: 2017-03-03 | Disposition: A | Discharge status: Home / Self Care | Payer: BLUE CROSS/BLUE SHIELD | Source: Ambulatory Visit | Attending: Oncology | Admitting: Oncology

## 2017-03-03 ENCOUNTER — Ambulatory Visit (HOSPITAL_BASED_OUTPATIENT_CLINIC_OR_DEPARTMENT_OTHER): Payer: BLUE CROSS/BLUE SHIELD

## 2017-03-03 ENCOUNTER — Other Ambulatory Visit: Payer: Self-pay

## 2017-03-03 ENCOUNTER — Other Ambulatory Visit (HOSPITAL_BASED_OUTPATIENT_CLINIC_OR_DEPARTMENT_OTHER): Payer: BLUE CROSS/BLUE SHIELD

## 2017-03-03 VITALS — BP 138/60

## 2017-03-03 VITALS — BP 107/64 | HR 90 | Temp 98.9°F | Resp 18 | Ht 66.0 in | Wt 148.5 lb

## 2017-03-03 DIAGNOSIS — Z95828 Presence of other vascular implants and grafts: Secondary | ICD-10-CM

## 2017-03-03 DIAGNOSIS — C189 Malignant neoplasm of colon, unspecified: Secondary | ICD-10-CM

## 2017-03-03 DIAGNOSIS — C187 Malignant neoplasm of sigmoid colon: Secondary | ICD-10-CM

## 2017-03-03 DIAGNOSIS — Z5112 Encounter for antineoplastic immunotherapy: Secondary | ICD-10-CM

## 2017-03-03 DIAGNOSIS — C787 Secondary malignant neoplasm of liver and intrahepatic bile duct: Secondary | ICD-10-CM

## 2017-03-03 DIAGNOSIS — G893 Neoplasm related pain (acute) (chronic): Secondary | ICD-10-CM

## 2017-03-03 DIAGNOSIS — D509 Iron deficiency anemia, unspecified: Secondary | ICD-10-CM

## 2017-03-03 DIAGNOSIS — D649 Anemia, unspecified: Secondary | ICD-10-CM | POA: Diagnosis not present

## 2017-03-03 DIAGNOSIS — Z452 Encounter for adjustment and management of vascular access device: Secondary | ICD-10-CM | POA: Diagnosis not present

## 2017-03-03 DIAGNOSIS — I10 Essential (primary) hypertension: Secondary | ICD-10-CM | POA: Diagnosis not present

## 2017-03-03 DIAGNOSIS — Z5111 Encounter for antineoplastic chemotherapy: Secondary | ICD-10-CM

## 2017-03-03 DIAGNOSIS — D500 Iron deficiency anemia secondary to blood loss (chronic): Secondary | ICD-10-CM

## 2017-03-03 LAB — COMPREHENSIVE METABOLIC PANEL
ALBUMIN: 2.3 g/dL — AB (ref 3.5–5.0)
ALK PHOS: 149 U/L (ref 40–150)
ALT: 10 U/L (ref 0–55)
AST: 43 U/L — AB (ref 5–34)
Anion Gap: 11 mEq/L (ref 3–11)
BILIRUBIN TOTAL: 0.62 mg/dL (ref 0.20–1.20)
BUN: 13.7 mg/dL (ref 7.0–26.0)
CO2: 26 mEq/L (ref 22–29)
CREATININE: 1.3 mg/dL — AB (ref 0.6–1.1)
Calcium: 9.1 mg/dL (ref 8.4–10.4)
Chloride: 102 mEq/L (ref 98–109)
EGFR: 54 mL/min/{1.73_m2} — ABNORMAL LOW (ref >90)
GLUCOSE: 99 mg/dL (ref 70–140)
Potassium: 4.5 mEq/L (ref 3.5–5.1)
SODIUM: 139 meq/L (ref 136–145)
TOTAL PROTEIN: 7.1 g/dL (ref 6.4–8.3)

## 2017-03-03 LAB — CBC WITH DIFFERENTIAL/PLATELET
BASO%: 0.2 % (ref 0.0–2.0)
Basophils Absolute: 0 10*3/uL (ref 0.0–0.1)
EOS%: 2.8 % (ref 0.0–7.0)
Eosinophils Absolute: 0.2 10*3/uL (ref 0.0–0.5)
HEMATOCRIT: 24.7 % — AB (ref 34.8–46.6)
HEMOGLOBIN: 7.1 g/dL — AB (ref 11.6–15.9)
LYMPH#: 0.7 10*3/uL — AB (ref 0.9–3.3)
LYMPH%: 12.8 % — ABNORMAL LOW (ref 14.0–49.7)
MCH: 20.7 pg — ABNORMAL LOW (ref 25.1–34.0)
MCHC: 28.7 g/dL — ABNORMAL LOW (ref 31.5–36.0)
MCV: 72 fL — ABNORMAL LOW (ref 79.5–101.0)
MONO#: 0.8 10*3/uL (ref 0.1–0.9)
MONO%: 14.9 % — ABNORMAL HIGH (ref 0.0–14.0)
NEUT#: 3.7 10*3/uL (ref 1.5–6.5)
NEUT%: 69.3 % (ref 38.4–76.8)
NRBC: 0 % (ref 0–0)
Platelets: 546 10*3/uL — ABNORMAL HIGH (ref 145–400)
RBC: 3.43 10*6/uL — ABNORMAL LOW (ref 3.70–5.45)
RDW: 19.3 % — AB (ref 11.2–14.5)
WBC: 5.4 10*3/uL (ref 3.9–10.3)

## 2017-03-03 LAB — CEA (IN HOUSE-CHCC): CEA (CHCC-In House): 557.12 ng/mL — ABNORMAL HIGH (ref 0.00–5.00)

## 2017-03-03 LAB — PREPARE RBC (CROSSMATCH)

## 2017-03-03 MED ORDER — IRINOTECAN HCL CHEMO INJECTION 100 MG/5ML
135.0000 mg/m2 | Freq: Once | INTRAVENOUS | Status: AC
  Administered 2017-03-03: 240 mg via INTRAVENOUS
  Filled 2017-03-03: qty 10

## 2017-03-03 MED ORDER — SODIUM CHLORIDE 0.9 % IV SOLN
Freq: Once | INTRAVENOUS | Status: AC
  Administered 2017-03-03: 13:00:00 via INTRAVENOUS

## 2017-03-03 MED ORDER — PALONOSETRON HCL INJECTION 0.25 MG/5ML
0.2500 mg | Freq: Once | INTRAVENOUS | Status: AC
  Administered 2017-03-03: 0.25 mg via INTRAVENOUS

## 2017-03-03 MED ORDER — SODIUM CHLORIDE 0.9 % IV SOLN
5.0000 mg/kg | Freq: Once | INTRAVENOUS | Status: AC
  Administered 2017-03-03: 325 mg via INTRAVENOUS
  Filled 2017-03-03: qty 13

## 2017-03-03 MED ORDER — ATROPINE SULFATE 1 MG/ML IJ SOLN
INTRAMUSCULAR | Status: AC
  Filled 2017-03-03: qty 1

## 2017-03-03 MED ORDER — DEXAMETHASONE 4 MG PO TABS: 4.0000 mg | ORAL_TABLET | Freq: Two times a day (BID) | ORAL | 0 refills | Status: AC

## 2017-03-03 MED ORDER — LEUCOVORIN CALCIUM INJECTION 350 MG
396.0000 mg/m2 | Freq: Once | INTRAMUSCULAR | Status: AC
  Administered 2017-03-03: 700 mg via INTRAVENOUS
  Filled 2017-03-03: qty 35

## 2017-03-03 MED ORDER — OXYCODONE HCL ER 20 MG PO T12A: 20.0000 mg | EXTENDED_RELEASE_TABLET | Freq: Two times a day (BID) | ORAL | 0 refills | Status: DC

## 2017-03-03 MED ORDER — SODIUM CHLORIDE 0.9 % IV SOLN
2400.0000 mg/m2 | INTRAVENOUS | Status: DC
  Administered 2017-03-03: 4250 mg via INTRAVENOUS
  Filled 2017-03-03: qty 85

## 2017-03-03 MED ORDER — ATROPINE SULFATE 1 MG/ML IJ SOLN
0.5000 mg | Freq: Once | INTRAMUSCULAR | Status: AC | PRN
  Administered 2017-03-03: 0.5 mg via INTRAVENOUS

## 2017-03-03 MED ORDER — PALONOSETRON HCL INJECTION 0.25 MG/5ML
INTRAVENOUS | Status: AC
  Filled 2017-03-03: qty 5

## 2017-03-03 MED ORDER — SODIUM CHLORIDE 0.9 % IJ SOLN
10.0000 mL | INTRAMUSCULAR | Status: DC | PRN
  Administered 2017-03-03: 10 mL via INTRAVENOUS
  Filled 2017-03-03: qty 10

## 2017-03-03 MED ORDER — OXYCODONE HCL 5 MG PO TABS: ORAL_TABLET | ORAL | 0 refills | Status: DC

## 2017-03-03 MED ORDER — SODIUM CHLORIDE 0.9 % IV SOLN
Freq: Once | INTRAVENOUS | Status: AC
  Administered 2017-03-03: 14:00:00 via INTRAVENOUS
  Filled 2017-03-03: qty 5

## 2017-03-03 NOTE — Progress Notes (Signed)
Per MD [name], ok to stop pump on 03/05/17 when patient comes in for her 2 units of PRBCs  at 0800. Per MD [name], do not bolus 5FU pump, just disconnect and discard remaining volume.

## 2017-03-03 NOTE — Progress Notes (Signed)
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 She was seen in the emergency room 02/19/2017 with "weakness ". The BUN and creatinine were elevated. She received intravenous fluids and felt better.  She was seen in the emergency room 02/21/2017 with increased abdominal pain. She was given morphine and fentanyl for pain. A CT the abdomen compared to 12/17/2016 revealed progressive metastatic disease involving the liver, abdominal lymph nodes, pulmonary nodules, and carcinomatosis.  She continues to have abdominal pain. She takes 10 mg of oxycodone every 3 hours.  She had nausea following chemotherapy. She had one day of diarrhea.  Her appetite is poor. 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 She feels very weak.   Objective:  Vital signs in last 24 hours:  Blood pressure 107/64, pulse 90, temperature 98.9 F (37.2 C), temperature source Oral, resp. rate 18, height 5' 6"  (1.676 m), weight 148 lb 8 oz (67.4 kg), SpO2 100 %.    HEENT: The conjunctivae are pale, no thrush or ulcers Resp: Lungs clear bilaterally Cardio: Regular rate and rhythm GI: Upper abdominal wall mass, hepatomegaly  Vascular: No leg edema Neuro: Alert and oriented she ambulated to the examination table without assistance  Skin: Mild hyperpigmentation of the hands   Portacath/PICC-without erythema  Lab Results:  Lab Results  Component Value Date   WBC 5.4 03/03/2017   HGB 7.1 (L) 03/03/2017   HCT 24.7 (L) 03/03/2017   MCV 72.0 (L) 03/03/2017   PLT 546 (H) 03/03/2017   NEUTROABS 3.7 03/03/2017    CMP     Component Value Date/Time   NA 139 03/03/2017 1129   K 4.5 03/03/2017 1129   CL 104 02/21/2017 1450   CO2 26 03/03/2017 1129   GLUCOSE 99 03/03/2017 1129   BUN 13.7 03/03/2017 1129   CREATININE 1.3 (H) 03/03/2017 1129   CALCIUM 9.1 03/03/2017 1129   PROT 7.1 03/03/2017 1129   ALBUMIN 2.3 (L) 03/03/2017 1129   AST 43 (H) 03/03/2017 1129   ALT 10 03/03/2017 1129   ALKPHOS 149 03/03/2017 1129   BILITOT 0.62 03/03/2017 1129   GFRNONAA >60 02/21/2017 1450   GFRAA >60 02/21/2017 1450    Lab Results  Component Value Date   CEA1 557.12 (H) 03/03/2017     Medications: I have reviewed the patient's current medications.  Assessment/Plan: 1. 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  Laparoscopic sigmoid colectomy, liver biopsy, and Port-A-Cath insertion 04/08/2015  Sigmoid colon tumor-T3 N0, liver biopsy confirmed metastatic adenocarcinoma  Chemotherapy initiated with FOLFIRI 05/08/2015, Avastin added with cycle 2  CT 07/14/2015-improvement in liver metastases  CT 10/12/2015-continued improvement in liver metastases  CT 06/11/2016-new small pleural effusions, stable hepatic metastases, stable soft tissue density at the ventral midline abdominal wall  Admission 06/16/2016 through 06/20/2016 with congestive heart failure and pneumonia  07/21/2016-biopsy of abdominal wall mass-positive for adenocarcinoma  08/12/2016-chemotherapy switch to Xeloda 2 weeks on/one-week off and Avastin every 2 weeks  09/24/2016-PET scan with new hypermetabolicomental nodules and a new hypermetabolic gastrohepatic ligament node, growth of abdominal wall mass and progression of liver metastases  Treatment switch to FOLFOX/Avastin beginning 10/14/2016  CT abdomen/pelvis 12/17/2016-enlargement of liver metastases, increased celiac node, increased size of peritoneal nodules, stable anterior abdominal wall mass  FOLFIRI/Avastin beginning 02/17/2017   Cycle 2  FOLFIRI/Avastin 03/03/2017  2. Congestive heart failure diagnosed in November 2017-unclearetiology, infection related  cardiomyopathy? improved LVEF on an echocardiogram 02/14/2017  3. Hypertension  4. History of iron deficiency anemia and beta thalassemia trait  5. 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 She is symptomatic with pain, anorexia, and failure to thrive. She has completed only 1 cycle of salvage chemotherapy with FOLFIRI/Avastin. Her performance status has declined, but she would like to continue chemotherapy.  We adjusted the narcotic analgesic regimen today. She will begin OxyContin and continue oxycodone for breakthrough pain.  She had delayed nausea following cycle 1 FOLFIRI. We will add Decadron prophylaxis with cycle 2. The plan is to add daily prednisone if her appetite does not improve following this cycle.  She has severe anemia secondary to chemotherapy and chronic disease. We will arrange for a red cell transfusion within the next few days.  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  40 minutes were spent with the patient today. The majority of the time was used for counseling and coordination of care.  [name], MD  03/03/2017  2:21 PM

## 2017-03-03 NOTE — Progress Notes (Signed)
OK to treat with HGB of 7.1 per MD. Desk RN establishing transfusion appointment for near future.  Wylene Simmer, BSN, RN 03/03/2017 1:11 PM

## 2017-03-03 NOTE — Patient Instructions (Signed)
[facility] Discharge Instructions for Patients Receiving Chemotherapy  Today you received the following chemotherapy agents: Avastin,Leucovorin, Irinotecan, Adrucil   To help prevent nausea and vomiting after your treatment, we encourage you to take your nausea medication.  Take it as often as prescribed.     If you develop nausea and vomiting that is not controlled by your nausea medication, call the clinic. If it is after clinic hours your family physician or the after hours number for the clinic or go to the Emergency Department.   BELOW ARE SYMPTOMS THAT SHOULD BE REPORTED IMMEDIATELY:  *FEVER GREATER THAN 100.5 F  *CHILLS WITH OR WITHOUT FEVER  NAUSEA AND VOMITING THAT IS NOT CONTROLLED WITH YOUR NAUSEA MEDICATION  *UNUSUAL SHORTNESS OF BREATH  *UNUSUAL BRUISING OR BLEEDING  TENDERNESS IN MOUTH AND THROAT WITH OR WITHOUT PRESENCE OF ULCERS  *URINARY PROBLEMS  *BOWEL PROBLEMS  UNUSUAL RASH Items with * indicate a potential emergency and should be followed up as soon as possible.

## 2017-03-03 NOTE — Patient Instructions (Signed)

## 2017-03-05 ENCOUNTER — Ambulatory Visit: Payer: BLUE CROSS/BLUE SHIELD

## 2017-03-05 VITALS — BP 150/82 | HR 67 | Temp 97.9°F | Resp 18

## 2017-03-05 DIAGNOSIS — C787 Secondary malignant neoplasm of liver and intrahepatic bile duct: Secondary | ICD-10-CM

## 2017-03-05 DIAGNOSIS — D649 Anemia, unspecified: Secondary | ICD-10-CM | POA: Diagnosis not present

## 2017-03-05 DIAGNOSIS — C189 Malignant neoplasm of colon, unspecified: Secondary | ICD-10-CM

## 2017-03-05 MED ORDER — SODIUM CHLORIDE 0.9% FLUSH
10.0000 mL | INTRAVENOUS | Status: DC | PRN
  Administered 2017-03-05: 10 mL
  Filled 2017-03-05: qty 10

## 2017-03-05 MED ORDER — HEPARIN SOD (PORK) LOCK FLUSH 100 UNIT/ML IV SOLN
500.0000 [IU] | Freq: Once | INTRAVENOUS | Status: AC | PRN
  Administered 2017-03-05: 500 [IU]
  Filled 2017-03-05: qty 5

## 2017-03-05 NOTE — Progress Notes (Signed)
Stopped 5FU pump prior to starting blood transfusion per Dr. Gearldine Shown orders.  18 ml residual volume. No bolus per Dr. [name].

## 2017-03-05 NOTE — Patient Instructions (Addendum)

## 2017-03-06 LAB — TYPE AND SCREEN
ABO/RH(D): O NEG
Antibody Screen: NEGATIVE
Unit division: 0
Unit division: 0

## 2017-03-06 LAB — BPAM RBC
Blood Product Expiration Date: 201808282359
Blood Product Expiration Date: 201808292359
ISSUE DATE / TIME: 201807280818
ISSUE DATE / TIME: 201807280818
UNIT TYPE AND RH: 9500
Unit Type and Rh: 9500

## 2017-03-09 ENCOUNTER — Ambulatory Visit (HOSPITAL_BASED_OUTPATIENT_CLINIC_OR_DEPARTMENT_OTHER): Payer: BLUE CROSS/BLUE SHIELD | Admitting: Oncology

## 2017-03-09 VITALS — BP 102/79 | HR 97 | Temp 97.7°F | Resp 18 | Ht 66.0 in | Wt 147.8 lb

## 2017-03-09 DIAGNOSIS — C189 Malignant neoplasm of colon, unspecified: Secondary | ICD-10-CM

## 2017-03-09 DIAGNOSIS — D509 Iron deficiency anemia, unspecified: Secondary | ICD-10-CM

## 2017-03-09 DIAGNOSIS — G893 Neoplasm related pain (acute) (chronic): Secondary | ICD-10-CM | POA: Diagnosis not present

## 2017-03-09 DIAGNOSIS — C787 Secondary malignant neoplasm of liver and intrahepatic bile duct: Secondary | ICD-10-CM

## 2017-03-09 DIAGNOSIS — C187 Malignant neoplasm of sigmoid colon: Secondary | ICD-10-CM | POA: Diagnosis not present

## 2017-03-09 DIAGNOSIS — I10 Essential (primary) hypertension: Secondary | ICD-10-CM | POA: Diagnosis not present

## 2017-03-09 MED ORDER — DEXAMETHASONE 4 MG PO TABS: 4.0000 mg | ORAL_TABLET | Freq: Two times a day (BID) | ORAL | 0 refills | Status: DC

## 2017-03-09 MED ORDER — OXYCODONE HCL 5 MG PO TABS: ORAL_TABLET | ORAL | 0 refills | Status: DC

## 2017-03-09 NOTE — Progress Notes (Signed)
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 She took Decadron prophylaxis following chemotherapy and had less nausea. She had an episode of nausea/vomiting and diarrhea yesterday and again today. Her appetite has improved. She was transfused packed red blood cells 03/05/2017.  She continues to have abdominal pain. She takes oxycodone every 4 hours. Her insurance company did not approve the OxyContin. She reports her blood pressure is low in the mornings. She would like to consider adjusting the heart failure regimen.   Objective:  Vital signs in last 24 hours:  Blood pressure 102/79, pulse 97, temperature 97.7 F (36.5 C), temperature source Oral, resp. rate 18, height _0  (1.676 m), weight 147 lb 12.8 oz (67 kg), SpO2 100 %.    HEENT: No thrush or ulcers Resp: Lungs clear bilaterally Cardio: Regular rate and rhythm GI: The liver is palpable throughout the upper abdomen, upper abdominal wall mass Vascular: No leg edema Skin: Hyperpigmentation of the hands   Portacath/PICC-without erythema  Lab Results:  Lab Results  Component Value Date   WBC 5.4 03/03/2017   HGB 7.1 (L) 03/03/2017   HCT 24.7 (L) 03/03/2017   MCV 72.0 (L) 03/03/2017   PLT 546 (H) 03/03/2017   NEUTROABS 3.7 03/03/2017    CMP     Component Value Date/Time   NA 139 03/03/2017 1129   K 4.5 03/03/2017 1129   CL 104 02/21/2017 1450   CO2 26 03/03/2017 1129   GLUCOSE 99 03/03/2017 1129   BUN 13.7 03/03/2017 1129   CREATININE 1.3 (H) 03/03/2017 1129   CALCIUM 9.1 03/03/2017 1129   PROT 7.1 03/03/2017 1129   ALBUMIN 2.3 (L) 03/03/2017 1129   AST 43 (H) 03/03/2017 1129   ALT 10 03/03/2017 1129   ALKPHOS 149 03/03/2017 1129   BILITOT 0.62 03/03/2017 1129   GFRNONAA >60 02/21/2017 1450   GFRAA >60 02/21/2017 1450    Lab Results  Component Value Date   CEA1 557.12 (H)  03/03/2017    Lab Results  Component Value Date   INR 1.26 04/08/2015    Imaging:  No results found.  Medications: I have reviewed the patient's current medications.  Assessment/Plan: 1. 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  Laparoscopic sigmoid colectomy, liver biopsy, and Port-A-Cath insertion 04/08/2015  Sigmoid colon tumor-T3 N0, liver biopsy confirmed metastatic adenocarcinoma  Chemotherapy initiated with FOLFIRI 05/08/2015, Avastin added with cycle 2  CT 07/14/2015-improvement in liver metastases  CT 10/12/2015-continued improvement in liver metastases  CT 06/11/2016-new small pleural effusions, stable hepatic metastases, stable soft tissue density at the ventral midline abdominal wall  Admission 06/16/2016 through 06/20/2016 with congestive heart failure and pneumonia  07/21/2016-biopsy of abdominal wall mass-positive for adenocarcinoma  08/12/2016-chemotherapy switch to Xeloda 2 weeks on/one-week off and Avastin every 2 weeks  09/24/2016-PET scan with new hypermetabolicomental nodules and a new hypermetabolic gastrohepatic ligament node, growth of abdominal wall mass and progression of liver metastases  Treatment switch to FOLFOX/Avastin beginning 10/14/2016  CT abdomen/pelvis 12/17/2016-enlargement of liver metastases, increased celiac node, increased size of peritoneal nodules, stable anterior abdominal wall mass  FOLFIRI/Avastin beginning 02/17/2017   Cycle 2 FOLFIRI/Avastin 03/03/2017  2. Congestive heart failure diagnosed in November 2017-unclearetiology, infection related cardiomyopathy?improved LVEF on an echocardiogram 02/14/2017  3. Hypertension  4. History of  iron deficiency anemia and beta thalassemia trait  5. Pain secondary to metastatic  colon cancer involving an abdominal wall mass and liver metastasis    Disposition:  She tolerated the second cycle of FOLFIRI with less nausea and vomiting. She continues to have delayed nausea. We will add 2 more days of Decadron.  We will try to get insurance approval for the OxyContin. She continues as needed oxycodone.  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 She would like to consider adjusting the heart failure regimen secondary to hypotension.  She will return for an office visit as scheduled on 03/17/2017.  25 minutes were spent with the patient today. The majority of the time was used for counseling and coordination of care.  [name], MD  03/09/2017  10:15 AM

## 2017-03-10 ENCOUNTER — Telehealth: Payer: Self-pay | Admitting: *Deleted

## 2017-03-10 NOTE — Telephone Encounter (Signed)
Dr. [name] discussed case with Dr. Haroldine Laws. Cardiologist recommends to cut carvedilol to 3.125 bid and decrease lasix to 20mg  daily with instructions to take extra as needed.  Called pt with above instructions. She voiced understanding. She reports she is doing OK with Oxycodone 10 mg Q4H PRN right now. Informed her we will call once we get a response from [place] re: prior auth for Oxycontin. Prior auth request was faxed to insurer on 8/1.

## 2017-03-13 ENCOUNTER — Other Ambulatory Visit: Payer: Self-pay | Admitting: Oncology

## 2017-03-14 ENCOUNTER — Telehealth: Payer: Self-pay

## 2017-03-14 NOTE — Telephone Encounter (Signed)
Pt called that [employer] denied oxycontin stating that we only listed 2 other medications tried prior to the oxycontin.

## 2017-03-15 ENCOUNTER — Encounter: Payer: Self-pay | Admitting: Oncology

## 2017-03-15 NOTE — Telephone Encounter (Signed)
Re-faxed prior auth to [facility]. It is unclear which medication is on their formulary. Pt has tried MSIR and is currently taking Oxycodone 10-15 mg Q4 hours PRN pain.  Requested Shauna in managed care follow up. Was told to circle Oxycontin on the request and return fax. Same done.

## 2017-03-15 NOTE — Progress Notes (Signed)
Followed up on staff message to follow up on Oxycontin PA request done by RN.  Called [facility] pharmacy(John) to obtain prescription drug coverage and id number.  [employer] YV#[id number]  [phone]) whom advised to contact state plan.  Called [employer](Evangela M)@[phone] whom advised the request needed to be changed from Oxycodone to Oxycontin and faxed back for review.  Relayed information to [employer].

## 2017-03-17 ENCOUNTER — Other Ambulatory Visit (HOSPITAL_BASED_OUTPATIENT_CLINIC_OR_DEPARTMENT_OTHER): Payer: BLUE CROSS/BLUE SHIELD

## 2017-03-17 ENCOUNTER — Telehealth: Payer: Self-pay | Admitting: *Deleted

## 2017-03-17 ENCOUNTER — Ambulatory Visit: Payer: BLUE CROSS/BLUE SHIELD

## 2017-03-17 ENCOUNTER — Ambulatory Visit (HOSPITAL_BASED_OUTPATIENT_CLINIC_OR_DEPARTMENT_OTHER): Payer: BLUE CROSS/BLUE SHIELD | Admitting: Oncology

## 2017-03-17 ENCOUNTER — Ambulatory Visit: Payer: BLUE CROSS/BLUE SHIELD | Admitting: Nutrition

## 2017-03-17 VITALS — BP 93/62 | HR 92 | Resp 18 | Ht 66.0 in | Wt 142.9 lb

## 2017-03-17 DIAGNOSIS — R031 Nonspecific low blood-pressure reading: Secondary | ICD-10-CM | POA: Diagnosis not present

## 2017-03-17 DIAGNOSIS — Z452 Encounter for adjustment and management of vascular access device: Secondary | ICD-10-CM

## 2017-03-17 DIAGNOSIS — C187 Malignant neoplasm of sigmoid colon: Secondary | ICD-10-CM

## 2017-03-17 DIAGNOSIS — D500 Iron deficiency anemia secondary to blood loss (chronic): Secondary | ICD-10-CM

## 2017-03-17 DIAGNOSIS — Z95828 Presence of other vascular implants and grafts: Secondary | ICD-10-CM

## 2017-03-17 DIAGNOSIS — R634 Abnormal weight loss: Secondary | ICD-10-CM | POA: Diagnosis not present

## 2017-03-17 DIAGNOSIS — D509 Iron deficiency anemia, unspecified: Secondary | ICD-10-CM | POA: Diagnosis not present

## 2017-03-17 DIAGNOSIS — C189 Malignant neoplasm of colon, unspecified: Secondary | ICD-10-CM

## 2017-03-17 DIAGNOSIS — C787 Secondary malignant neoplasm of liver and intrahepatic bile duct: Principal | ICD-10-CM

## 2017-03-17 LAB — COMPREHENSIVE METABOLIC PANEL
ALT: 20 U/L (ref 0–55)
ANION GAP: 15 meq/L — AB (ref 3–11)
AST: 85 U/L — ABNORMAL HIGH (ref 5–34)
Albumin: 2.3 g/dL — ABNORMAL LOW (ref 3.5–5.0)
Alkaline Phosphatase: 223 U/L — ABNORMAL HIGH (ref 40–150)
BILIRUBIN TOTAL: 0.97 mg/dL (ref 0.20–1.20)
BUN: 29.5 mg/dL — ABNORMAL HIGH (ref 7.0–26.0)
CALCIUM: 9.6 mg/dL (ref 8.4–10.4)
CHLORIDE: 100 meq/L (ref 98–109)
CO2: 24 mEq/L (ref 22–29)
CREATININE: 1.7 mg/dL — AB (ref 0.6–1.1)
EGFR: 39 mL/min/{1.73_m2} — ABNORMAL LOW (ref >90)
Glucose: 100 mg/dl (ref 70–140)
Potassium: 4.3 mEq/L (ref 3.5–5.1)
Sodium: 139 mEq/L (ref 136–145)
Total Protein: 7.2 g/dL (ref 6.4–8.3)

## 2017-03-17 LAB — CBC WITH DIFFERENTIAL/PLATELET
BASO%: 0.1 % (ref 0.0–2.0)
BASOS ABS: 0 10*3/uL (ref 0.0–0.1)
EOS ABS: 0 10*3/uL (ref 0.0–0.5)
EOS%: 0.1 % (ref 0.0–7.0)
HEMATOCRIT: 31.3 % — AB (ref 34.8–46.6)
HGB: 9.5 g/dL — ABNORMAL LOW (ref 11.6–15.9)
LYMPH%: 6.9 % — AB (ref 14.0–49.7)
MCH: 22.7 pg — ABNORMAL LOW (ref 25.1–34.0)
MCHC: 30.4 g/dL — AB (ref 31.5–36.0)
MCV: 74.9 fL — ABNORMAL LOW (ref 79.5–101.0)
MONO#: 1.7 10*3/uL — ABNORMAL HIGH (ref 0.1–0.9)
MONO%: 11.9 % (ref 0.0–14.0)
NEUT%: 81 % — AB (ref 38.4–76.8)
NEUTROS ABS: 11.4 10*3/uL — AB (ref 1.5–6.5)
PLATELETS: 439 10*3/uL — AB (ref 145–400)
RBC: 4.18 10*6/uL (ref 3.70–5.45)
RDW: 22.5 % — ABNORMAL HIGH (ref 11.2–14.5)
WBC: 14.1 10*3/uL — AB (ref 3.9–10.3)
lymph#: 1 10*3/uL (ref 0.9–3.3)
nRBC: 0 % (ref 0–0)

## 2017-03-17 LAB — CEA (IN HOUSE-CHCC): CEA (CHCC-IN HOUSE): 964.24 ng/mL — AB (ref 0.00–5.00)

## 2017-03-17 MED ORDER — OXYCODONE HCL 5 MG PO TABS: ORAL_TABLET | ORAL | 0 refills | Status: DC

## 2017-03-17 MED ORDER — SODIUM CHLORIDE 0.9 % IJ SOLN
10.0000 mL | Freq: Once | INTRAMUSCULAR | Status: AC
Start: 2017-03-17 — End: 2017-03-17
  Administered 2017-03-17: 10 mL via INTRAVENOUS
  Filled 2017-03-17: qty 10

## 2017-03-17 MED ORDER — DEXAMETHASONE 4 MG PO TABS: 4.0000 mg | ORAL_TABLET | Freq: Every day | ORAL | 0 refills | Status: AC

## 2017-03-17 MED ORDER — SODIUM CHLORIDE 0.9 % IJ SOLN
10.0000 mL | INTRAMUSCULAR | Status: DC | PRN
  Administered 2017-03-17: 10 mL via INTRAVENOUS
  Filled 2017-03-17: qty 10

## 2017-03-17 MED ORDER — HEPARIN SOD (PORK) LOCK FLUSH 100 UNIT/ML IV SOLN
500.0000 [IU] | Freq: Once | INTRAVENOUS | Status: AC | PRN
  Administered 2017-03-17: 500 [IU] via INTRAVENOUS
  Filled 2017-03-17: qty 5

## 2017-03-17 MED ORDER — OXYCODONE HCL 10 MG PO TABS: 10.0000 mg | ORAL_TABLET | ORAL | 0 refills | Status: DC | PRN

## 2017-03-17 NOTE — Telephone Encounter (Signed)
Received fax from [place]: Oxycontin 20mg  has been authorized effective 03/15/17-03/14/18. Notified John at [employer]. Pt made aware. She voiced appreciation for call.

## 2017-03-17 NOTE — Progress Notes (Signed)
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 She felt better on Decadron. The abdominal pain has increased. Her insurance did not approve OxyContin. She takes oxycodone every 3 hours. She decreased the Coreg dose.  Objective:  Vital signs in last 24 hours:  Blood pressure 93/62, pulse 92, resp. rate 18, height _0  (1.676 m), weight 142 lb 14.4 oz (64.8 kg), SpO2 100 %.    HEENT: No thrush or ulcers Resp: Lungs clear bilaterally Cardio: Regular rate and rhythm GI: The liver is palpable throughout the right abdomen, upper abdominal wall mass Vascular: No leg edema  Skin: Mild hyperpigmentation of the hands   Portacath/PICC-without erythema  Lab Results:  Lab Results  Component Value Date   WBC 14.1 (H) 03/17/2017   HGB 9.5 (L) 03/17/2017   HCT 31.3 (L) 03/17/2017   MCV 74.9 (L) 03/17/2017   PLT 439 (H) 03/17/2017   NEUTROABS 11.4 (H) 03/17/2017    CMP     Component Value Date/Time   NA 139 03/03/2017 1129   K 4.5 03/03/2017 1129   CL 104 02/21/2017 1450   CO2 26 03/03/2017 1129   GLUCOSE 99 03/03/2017 1129   BUN 13.7 03/03/2017 1129   CREATININE 1.3 (H) 03/03/2017 1129   CALCIUM 9.1 03/03/2017 1129   PROT 7.1 03/03/2017 1129   ALBUMIN 2.3 (L) 03/03/2017 1129   AST 43 (H) 03/03/2017 1129   ALT 10 03/03/2017 1129   ALKPHOS 149 03/03/2017 1129   BILITOT 0.62 03/03/2017 1129   GFRNONAA >60 02/21/2017 1450   GFRAA >60 02/21/2017 1450    Lab Results  Component Value Date   CEA1 557.12 (H) 03/03/2017     Medications: I have reviewed the patient's current medications.  Assessment/Plan: 1. 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  Laparoscopic  sigmoid colectomy, liver biopsy, and Port-A-Cath insertion 04/08/2015  Sigmoid colon tumor-T3 N0, liver biopsy confirmed metastatic adenocarcinoma  Chemotherapy initiated with FOLFIRI 05/08/2015, Avastin added with cycle 2  CT 07/14/2015-improvement in liver metastases  CT 10/12/2015-continued improvement in liver metastases  CT 06/11/2016-new small pleural effusions, stable hepatic metastases, stable soft tissue density at the ventral midline abdominal wall  Admission 06/16/2016 through 06/20/2016 with congestive heart failure and pneumonia  07/21/2016-biopsy of abdominal wall mass-positive for adenocarcinoma  08/12/2016-chemotherapy switch to Xeloda 2 weeks on/one-week off and Avastin every 2 weeks  09/24/2016-PET scan with new hypermetabolicomental nodules and a new hypermetabolic gastrohepatic ligament node, growth of abdominal wall mass and progression of liver metastases  Treatment switch to FOLFOX/Avastin beginning 10/14/2016  CT abdomen/pelvis 12/17/2016-enlargement of liver metastases, increased celiac node, increased size of peritoneal nodules, stable anterior abdominal wall mass  FOLFIRI/Avastin beginning 02/17/2017   Cycle 2 FOLFIRI/Avastin 03/03/2017  2. Congestive heart failure diagnosed in November 2017-unclearetiology, infection related cardiomyopathy?improved LVEF on an echocardiogram 02/14/2017  3. Hypertension  4. History of iron deficiency anemia and beta thalassemia trait  5. 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 Her blood pressure is low and she is losing weight. We decided to hold chemotherapy today.  She will discontinue the diuretics and potassium.  She will continue oxycodone for pain. We increased the oxycodone dose to  10-20 milligrams every 4 hours as needed. We have appealed to [employer] for approval of  OxyContin.  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 We will plan for a Hospice referral if her performance status has not improved next week.  She will begin daily Decadron.  25 minutes were spent with the patient today. The majority of the time was used for counseling and coordination of care.   [name], MD  03/17/2017  10:33 AM

## 2017-03-17 NOTE — Telephone Encounter (Signed)
Message from [place] with [facility]. Brand name Oxycontin has "less restrictions" Can she take brand name medication? Returned call, YES. She will call once this has been reviewed.

## 2017-03-17 NOTE — Progress Notes (Signed)
Nutrition follow-up completed with patient diagnosed with metastatic colon cancer. Weight is decreasing and was documented as 142 pounds today, down from 160.5 pounds May 31. This is a 12% weight loss in 2 months, which is significant. Chemotherapy was canceled today. Patient reports ongoing problems with nausea and vomiting as well as 2-3 diarrhea stools daily. She is eating very little. Reports Decadron has been prescribed to help with nausea. Patient dislikes all oral nutrition supplements. RN states physician is discussing palliative care.  Nutrition diagnosis: Food and nutrition related knowledge deficit continues.  Severe malnutrition in the context of chronic illness secondary to greater than 7.5% weight loss over 3 months (12% over 3 months) and less than 75% energy intake for greater than one month.  Patient has some lower extremity edema.  Intervention: Educated patient on strategies for increasing oral intake to increase quality-of-life. Strategies for reducing nausea and diarrhea. I provided support and encouragement for patient to increase oral intake. Questions were answered.  Teach back method used.  Monitoring, evaluation, goals: Patient will work to increase calories and protein to improve quality-of-life.  Next visit: Scheduled as needed.  **Disclaimer: This note was dictated with voice recognition software. Similar sounding words can inadvertently be transcribed and this note may contain transcription errors which may not have been corrected upon publication of note.**

## 2017-03-18 ENCOUNTER — Telehealth: Payer: Self-pay

## 2017-03-18 NOTE — Telephone Encounter (Signed)
[facility] has made several attempts to contact pt. This is pertaining to the lonsurf. Please advise them if it has been dc'd. [phone]. Not seen on MAR. Not mentioned in last 2 OV notes.

## 2017-03-18 NOTE — Telephone Encounter (Signed)
Notified pharmacy that pt is not taking Lonsurf.

## 2017-03-22 ENCOUNTER — Telehealth: Payer: Self-pay

## 2017-03-22 NOTE — Telephone Encounter (Signed)
Called and spoke with patient concerning her appointment for 8/15 with Lattie Haw.

## 2017-03-23 ENCOUNTER — Ambulatory Visit: Payer: Self-pay | Admitting: Oncology

## 2017-03-23 ENCOUNTER — Ambulatory Visit: Payer: Self-pay | Admitting: Nurse Practitioner

## 2017-03-23 ENCOUNTER — Inpatient Hospital Stay (HOSPITAL_COMMUNITY)
Admission: AD | Admit: 2017-03-23 | Discharge: 2017-03-27 | DRG: 682 | Disposition: A | Discharge status: Home / Self Care | Payer: BLUE CROSS/BLUE SHIELD | Source: Ambulatory Visit | Attending: Oncology | Admitting: Oncology

## 2017-03-23 ENCOUNTER — Ambulatory Visit (HOSPITAL_BASED_OUTPATIENT_CLINIC_OR_DEPARTMENT_OTHER): Payer: BLUE CROSS/BLUE SHIELD | Admitting: Nurse Practitioner

## 2017-03-23 ENCOUNTER — Other Ambulatory Visit: Payer: Self-pay

## 2017-03-23 ENCOUNTER — Ambulatory Visit (HOSPITAL_BASED_OUTPATIENT_CLINIC_OR_DEPARTMENT_OTHER): Payer: BLUE CROSS/BLUE SHIELD

## 2017-03-23 ENCOUNTER — Encounter (HOSPITAL_COMMUNITY): Payer: Self-pay | Admitting: *Deleted

## 2017-03-23 ENCOUNTER — Observation Stay (HOSPITAL_COMMUNITY): Payer: BLUE CROSS/BLUE SHIELD

## 2017-03-23 VITALS — Ht 66.0 in

## 2017-03-23 VITALS — BP 86/61 | HR 128 | Temp 97.1°F | Resp 17 | Ht 66.0 in | Wt 143.3 lb

## 2017-03-23 DIAGNOSIS — D6481 Anemia due to antineoplastic chemotherapy: Secondary | ICD-10-CM | POA: Diagnosis present

## 2017-03-23 DIAGNOSIS — I5043 Acute on chronic combined systolic (congestive) and diastolic (congestive) heart failure: Secondary | ICD-10-CM | POA: Diagnosis not present

## 2017-03-23 DIAGNOSIS — T451X5A Adverse effect of antineoplastic and immunosuppressive drugs, initial encounter: Secondary | ICD-10-CM | POA: Diagnosis present

## 2017-03-23 DIAGNOSIS — C187 Malignant neoplasm of sigmoid colon: Secondary | ICD-10-CM | POA: Diagnosis not present

## 2017-03-23 DIAGNOSIS — G893 Neoplasm related pain (acute) (chronic): Secondary | ICD-10-CM | POA: Diagnosis not present

## 2017-03-23 DIAGNOSIS — Z7952 Long term (current) use of systemic steroids: Secondary | ICD-10-CM

## 2017-03-23 DIAGNOSIS — I110 Hypertensive heart disease with heart failure: Secondary | ICD-10-CM | POA: Diagnosis present

## 2017-03-23 DIAGNOSIS — R000 Tachycardia, unspecified: Secondary | ICD-10-CM

## 2017-03-23 DIAGNOSIS — Z888 Allergy status to other drugs, medicaments and biological substances status: Secondary | ICD-10-CM

## 2017-03-23 DIAGNOSIS — D561 Beta thalassemia: Secondary | ICD-10-CM | POA: Diagnosis present

## 2017-03-23 DIAGNOSIS — I959 Hypotension, unspecified: Secondary | ICD-10-CM

## 2017-03-23 DIAGNOSIS — C787 Secondary malignant neoplasm of liver and intrahepatic bile duct: Secondary | ICD-10-CM | POA: Diagnosis not present

## 2017-03-23 DIAGNOSIS — Z79899 Other long term (current) drug therapy: Secondary | ICD-10-CM

## 2017-03-23 DIAGNOSIS — Z885 Allergy status to narcotic agent status: Secondary | ICD-10-CM

## 2017-03-23 DIAGNOSIS — E860 Dehydration: Secondary | ICD-10-CM | POA: Diagnosis not present

## 2017-03-23 DIAGNOSIS — R188 Other ascites: Secondary | ICD-10-CM | POA: Diagnosis present

## 2017-03-23 DIAGNOSIS — C189 Malignant neoplasm of colon, unspecified: Secondary | ICD-10-CM | POA: Diagnosis not present

## 2017-03-23 DIAGNOSIS — R160 Hepatomegaly, not elsewhere classified: Secondary | ICD-10-CM | POA: Diagnosis present

## 2017-03-23 DIAGNOSIS — R634 Abnormal weight loss: Secondary | ICD-10-CM | POA: Diagnosis present

## 2017-03-23 DIAGNOSIS — D500 Iron deficiency anemia secondary to blood loss (chronic): Secondary | ICD-10-CM

## 2017-03-23 DIAGNOSIS — Z95828 Presence of other vascular implants and grafts: Secondary | ICD-10-CM

## 2017-03-23 DIAGNOSIS — N179 Acute kidney failure, unspecified: Principal | ICD-10-CM | POA: Diagnosis present

## 2017-03-23 DIAGNOSIS — R531 Weakness: Secondary | ICD-10-CM

## 2017-03-23 DIAGNOSIS — D509 Iron deficiency anemia, unspecified: Secondary | ICD-10-CM | POA: Diagnosis present

## 2017-03-23 DIAGNOSIS — I10 Essential (primary) hypertension: Secondary | ICD-10-CM | POA: Diagnosis not present

## 2017-03-23 DIAGNOSIS — R6510 Systemic inflammatory response syndrome (SIRS) of non-infectious origin without acute organ dysfunction: Secondary | ICD-10-CM | POA: Diagnosis not present

## 2017-03-23 DIAGNOSIS — K5900 Constipation, unspecified: Secondary | ICD-10-CM | POA: Diagnosis present

## 2017-03-23 DIAGNOSIS — Z6823 Body mass index (BMI) 23.0-23.9, adult: Secondary | ICD-10-CM

## 2017-03-23 DIAGNOSIS — N19 Unspecified kidney failure: Secondary | ICD-10-CM

## 2017-03-23 LAB — COMPREHENSIVE METABOLIC PANEL
ALBUMIN: 2 g/dL — AB (ref 3.5–5.0)
ALK PHOS: 255 U/L — AB (ref 38–126)
ALT: 33 U/L (ref 0–55)
ALT: 31 U/L (ref 14–54)
ANION GAP: 15 meq/L — AB (ref 3–11)
AST: 113 U/L — ABNORMAL HIGH (ref 5–34)
AST: 99 U/L — AB (ref 15–41)
Albumin: 2.2 g/dL — ABNORMAL LOW (ref 3.5–5.0)
Alkaline Phosphatase: 342 U/L — ABNORMAL HIGH (ref 40–150)
Anion gap: 12 (ref 5–15)
BILIRUBIN TOTAL: 1.2 mg/dL (ref 0.3–1.2)
BUN: 57 mg/dL — AB (ref 7.0–26.0)
BUN: 54 mg/dL — AB (ref 6–20)
CALCIUM: 10.1 mg/dL (ref 8.4–10.4)
CHLORIDE: 104 meq/L (ref 98–109)
CO2: 22 meq/L (ref 22–29)
CO2: 24 mmol/L (ref 22–32)
CREATININE: 2.9 mg/dL — AB (ref 0.6–1.1)
CREATININE: 2.68 mg/dL — AB (ref 0.44–1.00)
Calcium: 8.9 mg/dL (ref 8.9–10.3)
Chloride: 108 mmol/L (ref 101–111)
EGFR: 20 mL/min/{1.73_m2} — AB (ref >90)
GFR calc Af Amer: 22 mL/min — ABNORMAL LOW (ref >60)
GFR, EST NON AFRICAN AMERICAN: 19 mL/min — AB (ref >60)
Glucose, Bld: 77 mg/dL (ref 65–99)
Glucose: 80 mg/dl (ref 70–140)
POTASSIUM: 4.5 meq/L (ref 3.5–5.1)
Potassium: 4.4 mmol/L (ref 3.5–5.1)
SODIUM: 141 meq/L (ref 136–145)
Sodium: 144 mmol/L (ref 135–145)
TOTAL PROTEIN: 6.4 g/dL — AB (ref 6.5–8.1)
Total Bilirubin: 0.91 mg/dL (ref 0.20–1.20)
Total Protein: 7.1 g/dL (ref 6.4–8.3)

## 2017-03-23 LAB — CBC WITH DIFFERENTIAL/PLATELET
BASO%: 0.8 % (ref 0.0–2.0)
BASOS ABS: 0.1 10*3/uL (ref 0.0–0.1)
BASOS ABS: 0 10*3/uL (ref 0.0–0.1)
Basophils Relative: 0 %
EOS ABS: 0 10*3/uL (ref 0.0–0.5)
EOS PCT: 0 %
EOS%: 0.4 % (ref 0.0–7.0)
Eosinophils Absolute: 0 10*3/uL (ref 0.0–0.7)
HCT: 31.9 % — ABNORMAL LOW (ref 34.8–46.6)
HCT: 27.5 % — ABNORMAL LOW (ref 36.0–46.0)
HEMOGLOBIN: 9.8 g/dL — AB (ref 11.6–15.9)
Hemoglobin: 8.4 g/dL — ABNORMAL LOW (ref 12.0–15.0)
LYMPH%: 3.9 % — AB (ref 14.0–49.7)
LYMPHS ABS: 1.1 10*3/uL (ref 0.7–4.0)
Lymphocytes Relative: 11 %
MCH: 22.8 pg — AB (ref 25.1–34.0)
MCH: 22.6 pg — ABNORMAL LOW (ref 26.0–34.0)
MCHC: 30.6 g/dL — ABNORMAL LOW (ref 31.5–36.0)
MCHC: 30.5 g/dL (ref 30.0–36.0)
MCV: 74.3 fL — AB (ref 79.5–101.0)
MCV: 73.9 fL — AB (ref 78.0–100.0)
MONO ABS: 1.2 10*3/uL — AB (ref 0.1–1.0)
MONO#: 1.9 10*3/uL — AB (ref 0.1–0.9)
MONO%: 15.9 % — ABNORMAL HIGH (ref 0.0–14.0)
Monocytes Relative: 13 %
NEUT#: 9.3 10*3/uL — ABNORMAL HIGH (ref 1.5–6.5)
NEUT%: 79 % — ABNORMAL HIGH (ref 38.4–76.8)
Neutro Abs: 7.3 10*3/uL (ref 1.7–7.7)
Neutrophils Relative %: 76 %
PLATELETS: 480 10*3/uL — AB (ref 145–400)
PLATELETS: 396 10*3/uL (ref 150–400)
RBC: 4.29 10*6/uL (ref 3.70–5.45)
RBC: 3.72 MIL/uL — AB (ref 3.87–5.11)
RDW: 24.6 % — ABNORMAL HIGH (ref 11.2–14.5)
RDW: 23.6 % — AB (ref 11.5–15.5)
WBC: 11.8 10*3/uL — AB (ref 3.9–10.3)
WBC: 9.6 10*3/uL (ref 4.0–10.5)
lymph#: 0.5 10*3/uL — ABNORMAL LOW (ref 0.9–3.3)

## 2017-03-23 LAB — LACTIC ACID, PLASMA: Lactic Acid, Venous: 1.6 mmol/L (ref 0.5–1.9)

## 2017-03-23 LAB — PHOSPHORUS: Phosphorus: 4.5 mg/dL (ref 2.5–4.6)

## 2017-03-23 MED ORDER — ONDANSETRON HCL 4 MG/2ML IJ SOLN
INTRAMUSCULAR | Status: AC
  Filled 2017-03-23: qty 4

## 2017-03-23 MED ORDER — OXYCODONE HCL 5 MG PO TABS
5.0000 mg | ORAL_TABLET | Freq: Four times a day (QID) | ORAL | Status: DC | PRN
  Administered 2017-03-23 – 2017-03-24 (×2): 5 mg via ORAL
  Filled 2017-03-23 (×2): qty 1

## 2017-03-23 MED ORDER — SODIUM CHLORIDE 0.9 % IV SOLN: Freq: Once | INTRAVENOUS | Status: DC

## 2017-03-23 MED ORDER — POLYETHYLENE GLYCOL 3350 17 G PO PACK
17.0000 g | PACK | Freq: Every day | ORAL | Status: DC | PRN
  Administered 2017-03-25 (×2): 17 g via ORAL
  Filled 2017-03-23 (×2): qty 1

## 2017-03-23 MED ORDER — ONDANSETRON HCL 4 MG PO TABS
4.0000 mg | ORAL_TABLET | Freq: Three times a day (TID) | ORAL | Status: DC | PRN
  Administered 2017-03-25 – 2017-03-27 (×5): 4 mg via ORAL
  Filled 2017-03-23 (×5): qty 1

## 2017-03-23 MED ORDER — SODIUM CHLORIDE 0.9 % IJ SOLN
10.0000 mL | INTRAMUSCULAR | Status: DC | PRN
  Filled 2017-03-23: qty 10

## 2017-03-23 MED ORDER — OXYCODONE HCL ER 20 MG PO T12A
20.0000 mg | EXTENDED_RELEASE_TABLET | Freq: Two times a day (BID) | ORAL | Status: DC
  Administered 2017-03-23: 20 mg via ORAL
  Filled 2017-03-23: qty 1

## 2017-03-23 MED ORDER — SODIUM CHLORIDE 0.9 % IV SOLN
INTRAVENOUS | Status: AC
  Administered 2017-03-23: 19:00:00 via INTRAVENOUS

## 2017-03-23 MED ORDER — DEXAMETHASONE 4 MG PO TABS
4.0000 mg | ORAL_TABLET | Freq: Every day | ORAL | Status: DC
  Administered 2017-03-24 – 2017-03-25 (×2): 4 mg via ORAL
  Filled 2017-03-23 (×2): qty 1

## 2017-03-23 MED ORDER — SODIUM CHLORIDE 0.9 % IV SOLN
INTRAVENOUS | Status: AC
  Administered 2017-03-23: 13:00:00 via INTRAVENOUS

## 2017-03-23 MED ORDER — ONDANSETRON HCL 4 MG/2ML IJ SOLN
8.0000 mg | Freq: Once | INTRAMUSCULAR | Status: AC
  Administered 2017-03-23: 8 mg via INTRAVENOUS

## 2017-03-23 MED ORDER — OXYCODONE HCL 5 MG PO TABS
20.0000 mg | ORAL_TABLET | Freq: Once | ORAL | Status: AC
  Administered 2017-03-23: 20 mg via ORAL
  Filled 2017-03-23: qty 4

## 2017-03-23 MED ORDER — CARVEDILOL 6.25 MG PO TABS
6.2500 mg | ORAL_TABLET | Freq: Every day | ORAL | Status: DC
  Administered 2017-03-24 – 2017-03-27 (×4): 6.25 mg via ORAL
  Filled 2017-03-23 (×4): qty 1

## 2017-03-23 MED ORDER — HEPARIN SOD (PORK) LOCK FLUSH 100 UNIT/ML IV SOLN
500.0000 [IU] | Freq: Once | INTRAVENOUS | Status: DC | PRN
  Filled 2017-03-23: qty 5

## 2017-03-23 MED ORDER — HEPARIN SODIUM (PORCINE) 5000 UNIT/ML IJ SOLN
5000.0000 [IU] | Freq: Three times a day (TID) | INTRAMUSCULAR | Status: DC
  Administered 2017-03-23 – 2017-03-24 (×2): 5000 [IU] via SUBCUTANEOUS
  Filled 2017-03-23 (×2): qty 1

## 2017-03-23 NOTE — H&P (Addendum)
History and Physical    Christina [name] VOJ:[id number] DOB: 1961-01-27 DOA: 03/23/2017  PCP: Benito Mccreedy, MD  Patient coming from: clinic  I have personally briefly reviewed patient's old medical records in [place]  Chief Complaint: weakness  HPI: Christina [name] is Christina [name] 56 y.o. female with medical history significant of metastatic colon cancer, HFrEF, HTN, iron deficiency anemia/beta thal who is presenting from oncology clinic with weakness, hypotension and tachycardia.   [name] notes about 3 days ago she noticed severe weakness. Describes weakness in her legs on Sunday so significant that she had to call her husband to help her at church. The weakness has improved since then. Before 3 days ago, she was feeling better. She notes nausea, but no vomiting. No diarrhea. She does have constipation. She notes abdominal pain in her upper quadrants that feels like Christina [name] heavy weight. Leaning to the right makes it better. Lean to the left makes her worse. Her pain medicines don't really help. She denies any fevers or chills. She denies any dysuria. She denies any shortness of breath. No sore throat or cough. No chest pain. She did have some lightheadedness.  She started Decadron on 03/17/17 due to weight loss and low BP's noted at the 8/9 visit with Dr. [name].  [name] notes her appetite has be great since the decadron was started.    She was seen for follow up in clinic today, found to be hypotensive and tachycardic and had general malaise.  She received bolus and labs and due to persistent weakness was planned to be direct admitted.    ED Course: Christina [name]  Review of Systems: As per HPI otherwise 10 point review of systems negative.  Review of Systems  Constitutional: Positive for malaise/fatigue. Negative for chills and fever.  HENT: Negative for sore throat.   Respiratory: Negative for cough and shortness of breath.   Cardiovascular: Negative for chest pain.  Gastrointestinal:  Positive for abdominal pain and nausea. Negative for diarrhea and vomiting.  Genitourinary: Negative for dysuria and urgency.  Neurological: Positive for weakness.       LH     Past Medical History:  Diagnosis Date  . Endometriosis   . Hypertension   . met colon ca to liver dx'd 03/2015  . Thalassanemia     Past Surgical History:  Procedure Laterality Date  . COLON RESECTION N/Christina [name] 04/08/2015   Procedure: LAPAROSCOPIC  RESECTION OF PART OF  SIGMOID COLON;  Surgeon: Christina Boston, MD;  Location: [facility];  Service: General;  Laterality: N/Christina [name];  . DIAGNOSTIC LAPAROSCOPY     Endometriosis  . LAPAROSCOPIC SIGMOID COLECTOMY  04/08/2015   for colorectal cancer  . LIVER BIOPSY N/Christina [name] 04/08/2015   Procedure: CORE NEEDLE LIVER BIOPSY;  Surgeon: Christina Boston, MD;  Location: [facility];  Service: General;  Laterality: N/Christina [name];  . MYOMECTOMY     Gyn in [place]  . PORTACATH PLACEMENT N/Christina [name] 04/08/2015   Procedure: INSERTION PORT-Raelin Pixler-CATH;  Surgeon: Christina Boston, MD;  Location: [facility];  Service: General;  Laterality: N/Christina [name];     reports that she has never smoked. She has never used smokeless tobacco. She reports that she drinks alcohol. She reports that she does not use drugs.  Allergies  Allergen Reactions  . Lactose Intolerance (Gi) Other (See Comments)    Diarrhea, Bloated, Abdominal pain.  Christina [name] Kitchen Morphine And Related Anaphylaxis    Sedation  . Dilaudid [Hydromorphone Hcl]     Severe cramping   . Lasix [Furosemide] Nausea And Vomiting  Problems with IV only  . Lorazepam Other (See Comments)    Pt couldn't walk straight, put her in Christina [name] daze    Family History  Problem Relation Age of Onset  . Anesthesia problems Cousin 52       maternal cousin, colon cancer   . Clotting disorder Maternal Grandmother     Prior to Admission medications   Medication Sig Start Date End Date Taking? Authorizing Provider  carvedilol (COREG) 6.25 MG tablet Take 6.25 mg by mouth daily.    Yes [provider]    dexamethasone (DECADRON) 4 MG tablet Take 1 tablet (4 mg total) by mouth daily. 03/17/17  Yes Ladell Pier, MD  ibuprofen (ADVIL,MOTRIN) 200 MG tablet Take 400 mg by mouth 2 (two) times daily as needed for moderate pain.   Yes [provider]  lidocaine-prilocaine (EMLA) cream Apply to port site one hour prior to use. Do not rub in. Cover with plastic. 12/16/16  Yes Ladell Pier, MD  ondansetron (ZOFRAN) 4 MG tablet Take 1 tablet (4 mg total) by mouth every 8 (eight) hours as needed for nausea or vomiting. 02/11/17  Yes Ladell Pier, MD  oxyCODONE (OXYCONTIN) 20 mg 12 hr tablet Take 1 tablet (20 mg total) by mouth every 12 (twelve) hours. 03/03/17  Yes Ladell Pier, MD  Oxycodone HCl 10 MG TABS Take 1-2 tablets (10-20 mg total) by mouth every 4 (four) hours as needed. Patient taking differently: Take 20 mg by mouth every 4 (four) hours as needed (pain).  03/17/17  Yes Ladell Pier, MD  losartan (COZAAR) 25 MG tablet Take 1 tablet (25 mg total) by mouth daily. Patient not taking: Reported on 03/23/2017 06/20/16   Murlean Iba, MD  magic mouthwash SOLN Equal parts Nystatin, Diphenhydramine and Maalax four times Christina [name] day as needed, 5-10 ml swish and spit Patient not taking: Reported on 03/23/2017 10/28/16   Ladell Pier, MD  prochlorperazine (COMPAZINE) 10 MG tablet Take 1 tablet (10 mg total) by mouth every 6 (six) hours as needed for nausea or vomiting. Patient not taking: Reported on 03/23/2017 10/11/16   Ladell Pier, MD    Physical Exam: Vitals:   03/23/17 1658 03/23/17 2039  BP: 124/89 134/89  Pulse: (!) 103 91  Resp: 16 18  Temp: 98.2 F (36.8 C) 98 F (36.7 C)  TempSrc: Oral Oral  SpO2: 99% 98%  Weight: 64.9 kg (143 lb)   Height: 5' 5.5" (1.664 m)     Constitutional: NAD, calm, comfortable Vitals:   03/23/17 1658 03/23/17 2039  BP: 124/89 134/89  Pulse: (!) 103 91  Resp: 16 18  Temp: 98.2 F (36.8 C) 98 F (36.7 C)  TempSrc: Oral Oral  SpO2:  99% 98%  Weight: 64.9 kg (143 lb)   Height: 5' 5.5" (1.664 m)    Eyes: PERRL, lids and conjunctivae normal ENMT: Mucous membranes are moist. Posterior pharynx clear of any exudate or lesions.Normal dentition.  Neck: normal, supple, no masses, no thyromegaly Respiratory: clear to auscultation bilaterally, no wheezing, no crackles. Normal respiratory effort. No accessory muscle use.  Cardiovascular: Regular rate and rhythm, no murmurs / rubs / gallops. No extremity edema. 2+ pedal pulses. No carotid bruits.  Abdomen: distended, TTP most over LUQ and Epigastric region. Palpable mass.  Bowel sounds positive.  Musculoskeletal: no clubbing / cyanosis. No joint deformity upper and lower extremities. Good ROM, no contractures. Normal muscle tone.  Skin: no rashes, lesions, ulcers. No induration Neurologic:  CN 2-12 grossly intact. Sensation intact, DTR normal. Moving all 4 extremities, lower extremities notable for 4+ strength.  Psychiatric: Normal judgment and insight. Alert and oriented x 3. Normal mood.   Labs on Admission: I have personally reviewed following labs and imaging studies  CBC:  Recent Labs Lab 03/17/17 0937 03/23/17 1257 03/23/17 1830  WBC 14.1* 11.8* 9.6  NEUTROABS 11.4* 9.3* 7.3  HGB 9.5* 9.8* 8.4*  HCT 31.3* 31.9* 27.5*  MCV 74.9* 74.3* 73.9*  PLT 439* 480* 299   Basic Metabolic Panel:  Recent Labs Lab 03/17/17 0937 03/23/17 1257 03/23/17 1830  NA 139 141 144  K 4.3 4.5 4.4  CL  --   --  108  CO2 24 22 24   GLUCOSE 100 80 77  BUN 29.5* 57.0* 54*  CREATININE 1.7* 2.9* 2.68*  CALCIUM 9.6 10.1 8.9   GFR: Estimated Creatinine Clearance: 21.5 mL/min (Naaman Curro) (by C-G formula based on SCr of 2.68 mg/dL (H)). Liver Function Tests:  Recent Labs Lab 03/17/17 2426 03/23/17 1257 03/23/17 1830  AST 85* 113* 99*  ALT 20 33 31  ALKPHOS 223* 342* 255*  BILITOT 0.97 0.91 1.2  PROT 7.2 7.1 6.4*  ALBUMIN 2.3* 2.0* 2.2*   No results for input(s): LIPASE, AMYLASE in the  last 168 hours. No results for input(s): AMMONIA in the last 168 hours. Coagulation Profile: No results for input(s): INR, PROTIME in the last 168 hours. Cardiac Enzymes: No results for input(s): CKTOTAL, CKMB, CKMBINDEX, TROPONINI in the last 168 hours. BNP (last 3 results) No results for input(s): PROBNP in the last 8760 hours. HbA1C: No results for input(s): HGBA1C in the last 72 hours. CBG: No results for input(s): GLUCAP in the last 168 hours. Lipid Profile: No results for input(s): CHOL, HDL, LDLCALC, TRIG, CHOLHDL, LDLDIRECT in the last 72 hours. Thyroid Function Tests: No results for input(s): TSH, T4TOTAL, FREET4, T3FREE, THYROIDAB in the last 72 hours. Anemia Panel: No results for input(s): VITAMINB12, FOLATE, FERRITIN, TIBC, IRON, RETICCTPCT in the last 72 hours. Urine analysis:    Component Value Date/Time   COLORURINE YELLOW 02/21/2017 1446   APPEARANCEUR CLOUDY (Jaryn Hocutt) 02/21/2017 1446   LABSPEC 1.015 02/21/2017 1446   PHURINE 5.0 02/21/2017 1446   GLUCOSEU NEGATIVE 02/21/2017 1446   HGBUR SMALL (Vesper Trant) 02/21/2017 1446   BILIRUBINUR NEGATIVE 02/21/2017 1446   KETONESUR NEGATIVE 02/21/2017 1446   PROTEINUR 30 (Angelisse Riso) 02/21/2017 1446   NITRITE NEGATIVE 02/21/2017 1446   LEUKOCYTESUR NEGATIVE 02/21/2017 1446    Radiological Exams on Admission: No results found.  EKG: Independently reviewed. Appears similar to priors.  NSR.  Normal intervals.  T wave inversion in lateral leads.  Assessment/Plan Principal Problem:   SIRS (systemic inflammatory response syndrome) (HCC) Active Problems:   Hypertension   Metastatic colon cancer to liver ([place])   Acute on chronic combined systolic and diastolic congestive heart failure (HCC)   Hypotension   Dehydration   Weakness  Systemic Inflammatory Response Syndrome:  Hypotensive and tachycardic in clinic to 86/61 with HR of 128, imrpoved to 141/94 with HR of 105 after 1 L NS.  Also with elevated WBC and tachycardia.  Suspect that  hypotension and tachycardia are most likely due to dehydration in the setting of elevated creatinine, calcium.  She reports good PO since starting dexamethasone.  She has no focal signs of infection.  Will order CXR, UA, and blood cultures.  Notably, she does have [place]argas port.  Will hold off on antibiotics given improvement in vital signs with IVF  and lack of other signs of infection.     Metastatic Colon Cancer:  Most recent CT with progression of metastatic colorectal cancer.  Notable for marked progression of hepatic metastatic dz (hepatomegaly, exertnal compression of main portal vein, small volume ascites, etc).  Discussed hospice today with Dr. [name] because of her decline, but she was not interested in this at this point.   Last chemo was FOLFIR/Avastin 7/26.  See most recent progress notes for details of hx/tx.  Continue oxycodone/oxycontin.  Continue decadron for PO.  NCCSRS reviewed and appropriate.  Hypotension and tachycardia:  As noted above, suspect due to dehydration which was likely due to hypercalcemia, possibly poor PO intake.  Improved with IVF, will continue 75 cc/hr NS x 12 hrs.     Hypercalcemia:  Corrected to 11.7 at clinic today.  Improved after IVF.  10.3 when corrected on arrival here.  Will continue IVF.  Added on PTH to labs from clinic.  Also f/u PTHrp and vitamin D levels.  Consider hypercalcemia of malignancy.  Oncology planning to consider bisphosphonate tomorrow.   Weakness:  Most notable in LE.  Seems to be improving, given her hypercalcemia today, this seems to be the most likely cause.  Will continue to monitor.   Acute Kidney Injury:  Baseline 0.97, worsening over past few weeks.  2.9 in clinic.  Improved to 2.68 on arrival.  Will continue IVF.  Likely 2/2 hypercalcemia and dehydration.  Follow up daily Cr.  Ins and outs.   Elevated WBC count:  Improved after IVF, may have been component of hemoconcentration with dehydration.  Daily CBC, UA and cultures pending.    Elevated AST and alkaline phosphatase:  Likely related to hepatic involvement of malignancy as well as dehydration.  Continue to monitor with daily CMP.   Iron deficiency anemia and beta thal:  Daily CBC.  8.4 after IVFs.  Baseline around 9.    HFrEF (EF 35-40%):  Holding losartan due to hypotension earlier.  Continue carvedilol.   HTN: holding losartan, continue carvedilol   DVT prophylaxis: heparin Code Status: full  Family Communication: husband in room  Disposition Plan: 8/17  Consults called: heme to see tomorrow Admission status: obs   Rusell Meneely Melven Sartorius MD [facility] Pager [phone]  If 7PM-7AM, please contact night-coverage [email or web address] Password [facility]  03/23/2017, 9:47 PM

## 2017-03-23 NOTE — Progress Notes (Signed)
Pt completed IV fluids (1 liter of NS). Pt states that she still does not feel well-weak, a little confused (per pt). VSS  Advised pt that she is to return to Cancer center for labs and fluids again tomorrow morning. Pt states that she does not feel well enough to go home. Pt very worried about how she will do at home in her current condition.  Discussed with Dr. [name]. He agreed to direct admit through hospitalist to [facility] hospitalist, Dr. Florene Glen. Reviewed pt status, labs, VS etc with him He agreed to admit to [facility] for hydration, monitor labs, treat pain and nausea as needed.  Pt and pt's husband aware and in agreement.  Pt placement called and requested bed on [address].  Port access maintained , changed dressing to biopatch and sorbaview dressing. Port flushed prior to transport.  Report called to [place] on [place] transported to [place] via w/c @ 1630

## 2017-03-23 NOTE — Patient Instructions (Signed)
Dehydration, Adult Dehydration is a condition in which there is not enough fluid or water in the body. This happens when you lose more fluids than you take in. Important organs, such as the kidneys, brain, and heart, cannot function without a proper amount of fluids. Any loss of fluids from the body can lead to dehydration. Dehydration can range from mild to severe. This condition should be treated right away to prevent it from becoming severe. What are the causes? This condition may be caused by:  Vomiting.  Diarrhea.  Excessive sweating, such as from heat exposure or exercise.  Not drinking enough fluid, especially: ? When ill. ? While doing activity that requires a lot of energy.  Excessive urination.  Fever.  Infection.  Certain medicines, such as medicines that cause the body to lose excess fluid (diuretics).  Inability to access safe drinking water.  Reduced physical ability to get adequate water and food.  What increases the risk? This condition is more likely to develop in people:  Who have a poorly controlled long-term (chronic) illness, such as diabetes, heart disease, or kidney disease.  Who are age 65 or older.  Who are disabled.  Who live in a place with high altitude.  Who play endurance sports.  What are the signs or symptoms? Symptoms of mild dehydration may include:  Thirst.  Dry lips.  Slightly dry mouth.  Dry, warm skin.  Dizziness. Symptoms of moderate dehydration may include:  Very dry mouth.  Muscle cramps.  Dark urine. Urine may be the color of tea.  Decreased urine production.  Decreased tear production.  Heartbeat that is irregular or faster than normal (palpitations).  Headache.  Light-headedness, especially when you stand up from a sitting position.  Fainting (syncope). Symptoms of severe dehydration may include:  Changes in skin, such as: ? Cold and clammy skin. ? Blotchy (mottled) or pale skin. ? Skin that does  not quickly return to normal after being lightly pinched and released (poor skin turgor).  Changes in body fluids, such as: ? Extreme thirst. ? No tear production. ? Inability to sweat when body temperature is high, such as in hot weather. ? Very little urine production.  Changes in vital signs, such as: ? Weak pulse. ? Pulse that is more than 100 beats a minute when sitting still. ? Rapid breathing. ? Low blood pressure.  Other changes, such as: ? Sunken eyes. ? Cold hands and feet. ? Confusion. ? Lack of energy (lethargy). ? Difficulty waking up from sleep. ? Short-term weight loss. ? Unconsciousness. How is this diagnosed? This condition is diagnosed based on your symptoms and a physical exam. Blood and urine tests may be done to help confirm the diagnosis. How is this treated? Treatment for this condition depends on the severity. Mild or moderate dehydration can often be treated at home. Treatment should be started right away. Do not wait until dehydration becomes severe. Severe dehydration is an emergency and it needs to be treated in a hospital. Treatment for mild dehydration may include:  Drinking more fluids.  Replacing salts and minerals in your blood (electrolytes) that you may have lost. Treatment for moderate dehydration may include:  Drinking an oral rehydration solution (ORS). This is a drink that helps you replace fluids and electrolytes (rehydrate). It can be found at pharmacies and retail stores. Treatment for severe dehydration may include:  Receiving fluids through an IV tube.  Receiving an electrolyte solution through a feeding tube that is passed through your nose   and into your stomach (nasogastric tube, or NG tube).  Correcting any abnormalities in electrolytes.  Treating the underlying cause of dehydration. Follow these instructions at home:  If directed by your health care provider, drink an ORS: ? Make an ORS by following instructions on the  package. ? Start by drinking small amounts, about  cup (120 mL) every 5-10 minutes. ? Slowly increase how much you drink until you have taken the amount recommended by your health care provider.  Drink enough clear fluid to keep your urine clear or pale yellow. If you were told to drink an ORS, finish the ORS first, then start slowly drinking other clear fluids. Drink fluids such as: ? Water. Do not drink only water. Doing that can lead to having too little salt (sodium) in the body (hyponatremia). ? Ice chips. ? Fruit juice that you have added water to (diluted fruit juice). ? Low-calorie sports drinks.  Avoid: ? Alcohol. ? Drinks that contain a lot of sugar. These include high-calorie sports drinks, fruit juice that is not diluted, and soda. ? Caffeine. ? Foods that are greasy or contain a lot of fat or sugar.  Take over-the-counter and prescription medicines only as told by your health care provider.  Do not take sodium tablets. This can lead to having too much sodium in the body (hypernatremia).  Eat foods that contain a healthy balance of electrolytes, such as bananas, oranges, potatoes, tomatoes, and spinach.  Keep all follow-up visits as told by your health care provider. This is important. Contact a health care provider if:  You have abdominal pain that: ? Gets worse. ? Stays in one area (localizes).  You have a rash.  You have a stiff neck.  You are more irritable than usual.  You are sleepier or more difficult to wake up than usual.  You feel weak or dizzy.  You feel very thirsty.  You have urinated only a small amount of very dark urine over 6-8 hours. Get help right away if:  You have symptoms of severe dehydration.  You cannot drink fluids without vomiting.  Your symptoms get worse with treatment.  You have a fever.  You have a severe headache.  You have vomiting or diarrhea that: ? Gets worse. ? Does not go away.  You have blood or green matter  (bile) in your vomit.  You have blood in your stool. This may cause stool to look black and tarry.  You have not urinated in 6-8 hours.  You faint.  Your heart rate while sitting still is over 100 beats a minute.  You have trouble breathing. This information is not intended to replace advice given to you by your health care provider. Make sure you discuss any questions you have with your health care provider. Document Released: 07/26/2005 Document Revised: 02/20/2016 Document Reviewed: 09/19/2015 Elsevier Interactive Patient Education  2018 [employer]  

## 2017-03-23 NOTE — Progress Notes (Addendum)
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 She denies nausea. No diarrhea. She remains weak. Fluid intake is poor. She notes improvement in her appetite with steroids. She tried OxyContin but was unable to tolerate it. She is taking oxycodone as needed and feels her pain overall is being controlled.  Objective:  Vital signs in last 24 hours:  Blood pressure (!) 86/61, pulse (!) 128, temperature (!) 97.1 F (36.2 C), temperature source Oral, resp. rate 17, height _0  (1.676 m), weight 143 lb 4.8 oz (65 kg), SpO2 100 %.    HEENT: No thrush or ulcers. Resp: Lungs clear bilaterally. Cardio: Regular, tachycardic. GI: Fullness without the right abdomen. Upper abdominal wall mass. Vascular: No leg edema.  Port-A-Cath without erythema.  Lab Results:  Lab Results  Component Value Date   WBC 14.1 (H) 03/17/2017   HGB 9.5 (L) 03/17/2017   HCT 31.3 (L) 03/17/2017   MCV 74.9 (L) 03/17/2017   PLT 439 (H) 03/17/2017   NEUTROABS 11.4 (H) 03/17/2017    Imaging:  No results found.  Medications: I have reviewed the patient's current medications.  Assessment/Plan: 1. 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  Laparoscopic sigmoid colectomy, liver biopsy, and Port-A-Cath insertion 04/08/2015  Sigmoid colon tumor-T3 N0, liver biopsy confirmed metastatic adenocarcinoma  Chemotherapy initiated with FOLFIRI 05/08/2015, Avastin added with cycle 2  CT 07/14/2015-improvement in liver metastases  CT 10/12/2015-continued improvement in liver metastases  CT 06/11/2016-new small pleural effusions, stable hepatic metastases, stable soft tissue density at the ventral midline abdominal wall  Admission 06/16/2016  through 06/20/2016 with congestive heart failure and pneumonia  07/21/2016-biopsy of abdominal wall mass-positive for adenocarcinoma  08/12/2016-chemotherapy switch to Xeloda 2 weeks on/one-week off and Avastin every 2 weeks  09/24/2016-PET scan with new hypermetabolicomental nodules and a new hypermetabolic gastrohepatic ligament node, growth of abdominal wall mass and progression of liver metastases  Treatment switch to FOLFOX/Avastin beginning 10/14/2016  CT abdomen/pelvis 12/17/2016-enlargement of liver metastases, increased celiac node, increased size of peritoneal nodules, stable anterior abdominal wall mass  FOLFIRI/Avastin beginning 02/17/2017   Cycle 2 FOLFIRI/Avastin 03/03/2017  2. Congestive heart failure diagnosed in November 2017-unclearetiology, infection related cardiomyopathy?improved LVEF on an echocardiogram 02/14/2017  3. Hypertension  4. History of iron deficiency anemia and beta thalassemia trait  5. 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 She understands this is most likely related to progression of the cancer versus toxicity from chemotherapy. Dr. 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 She would like to hold on this for now and return for reevaluation and possible chemotherapy next week. They understand in her current condition she is not a candidate for chemotherapy.  She is hypotensive and tachycardic. She will receive a liter of IV fluids. We will check a CBC.  She will return for a follow-up visit 03/31/2017. She will contact the office in the interim with any problems.  Patient seen with Dr. [name].    [name] ANP/GNP-BC   03/23/2017  1:29 PM  This was a shared visit with [name]. 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 Her performance status continues to decline.  She appears dehydrated. She does not appear to  be a candidate for further chemotherapy. We discussed Hospice care and she does not wish to consider this at present. She received intravenous fluids at the Cancer center today. She continues to feel "weak "after receiving IV fluids. She requested hospital admission. The corrected calcium was elevated today. She may have hypercalcemia of malignancy. Our plan was to repeat a chemistry panel on 03/24/2017 and decide on administering biphosphonate therapy.  We will request hospital admission for intravenous hydration, narcotic analgesics, and management of elevated the BUN/creatinine and calcium level.  She would like to remain on a full CODE STATUS.  I will see her on 03/24/2017 to discuss goals of care and and have further discussion regarding CODE STATUS.  [name], M.D.

## 2017-03-24 ENCOUNTER — Ambulatory Visit: Payer: Self-pay

## 2017-03-24 ENCOUNTER — Other Ambulatory Visit: Payer: Self-pay

## 2017-03-24 DIAGNOSIS — R410 Disorientation, unspecified: Secondary | ICD-10-CM | POA: Diagnosis not present

## 2017-03-24 DIAGNOSIS — I959 Hypotension, unspecified: Secondary | ICD-10-CM | POA: Diagnosis present

## 2017-03-24 DIAGNOSIS — K5900 Constipation, unspecified: Secondary | ICD-10-CM | POA: Diagnosis present

## 2017-03-24 DIAGNOSIS — C189 Malignant neoplasm of colon, unspecified: Secondary | ICD-10-CM | POA: Diagnosis present

## 2017-03-24 DIAGNOSIS — R634 Abnormal weight loss: Secondary | ICD-10-CM | POA: Diagnosis present

## 2017-03-24 DIAGNOSIS — Z6823 Body mass index (BMI) 23.0-23.9, adult: Secondary | ICD-10-CM | POA: Diagnosis not present

## 2017-03-24 DIAGNOSIS — I509 Heart failure, unspecified: Secondary | ICD-10-CM | POA: Diagnosis not present

## 2017-03-24 DIAGNOSIS — R188 Other ascites: Secondary | ICD-10-CM | POA: Diagnosis present

## 2017-03-24 DIAGNOSIS — D6481 Anemia due to antineoplastic chemotherapy: Secondary | ICD-10-CM | POA: Diagnosis present

## 2017-03-24 DIAGNOSIS — D563 Thalassemia minor: Secondary | ICD-10-CM

## 2017-03-24 DIAGNOSIS — N179 Acute kidney failure, unspecified: Secondary | ICD-10-CM | POA: Diagnosis present

## 2017-03-24 DIAGNOSIS — R000 Tachycardia, unspecified: Secondary | ICD-10-CM | POA: Diagnosis not present

## 2017-03-24 DIAGNOSIS — Z7952 Long term (current) use of systemic steroids: Secondary | ICD-10-CM | POA: Diagnosis not present

## 2017-03-24 DIAGNOSIS — I110 Hypertensive heart disease with heart failure: Secondary | ICD-10-CM | POA: Diagnosis present

## 2017-03-24 DIAGNOSIS — Z888 Allergy status to other drugs, medicaments and biological substances status: Secondary | ICD-10-CM | POA: Diagnosis not present

## 2017-03-24 DIAGNOSIS — T451X5A Adverse effect of antineoplastic and immunosuppressive drugs, initial encounter: Secondary | ICD-10-CM | POA: Diagnosis present

## 2017-03-24 DIAGNOSIS — R160 Hepatomegaly, not elsewhere classified: Secondary | ICD-10-CM | POA: Diagnosis present

## 2017-03-24 DIAGNOSIS — D630 Anemia in neoplastic disease: Secondary | ICD-10-CM

## 2017-03-24 DIAGNOSIS — Z885 Allergy status to narcotic agent status: Secondary | ICD-10-CM | POA: Diagnosis not present

## 2017-03-24 DIAGNOSIS — D509 Iron deficiency anemia, unspecified: Secondary | ICD-10-CM | POA: Diagnosis present

## 2017-03-24 DIAGNOSIS — G893 Neoplasm related pain (acute) (chronic): Secondary | ICD-10-CM | POA: Diagnosis not present

## 2017-03-24 DIAGNOSIS — Z79899 Other long term (current) drug therapy: Secondary | ICD-10-CM | POA: Diagnosis not present

## 2017-03-24 DIAGNOSIS — I5043 Acute on chronic combined systolic (congestive) and diastolic (congestive) heart failure: Secondary | ICD-10-CM | POA: Diagnosis present

## 2017-03-24 DIAGNOSIS — E860 Dehydration: Secondary | ICD-10-CM | POA: Diagnosis present

## 2017-03-24 DIAGNOSIS — C787 Secondary malignant neoplasm of liver and intrahepatic bile duct: Secondary | ICD-10-CM | POA: Diagnosis present

## 2017-03-24 DIAGNOSIS — D561 Beta thalassemia: Secondary | ICD-10-CM | POA: Diagnosis present

## 2017-03-24 LAB — URINALYSIS, COMPLETE (UACMP) WITH MICROSCOPIC
Bilirubin Urine: NEGATIVE
GLUCOSE, UA: NEGATIVE mg/dL
Ketones, ur: 5 mg/dL — AB
Leukocytes, UA: NEGATIVE
NITRITE: NEGATIVE
PH: 5 (ref 5.0–8.0)
PROTEIN: 30 mg/dL — AB
SPECIFIC GRAVITY, URINE: 1.015 (ref 1.005–1.030)

## 2017-03-24 LAB — COMPREHENSIVE METABOLIC PANEL
ALBUMIN: 2.3 g/dL — AB (ref 3.5–5.0)
ALT: 30 U/L (ref 14–54)
AST: 93 U/L — AB (ref 15–41)
Alkaline Phosphatase: 277 U/L — ABNORMAL HIGH (ref 38–126)
Anion gap: 15 (ref 5–15)
BUN: 57 mg/dL — AB (ref 6–20)
CHLORIDE: 107 mmol/L (ref 101–111)
CO2: 21 mmol/L — AB (ref 22–32)
Calcium: 9 mg/dL (ref 8.9–10.3)
Creatinine, Ser: 2.83 mg/dL — ABNORMAL HIGH (ref 0.44–1.00)
GFR calc Af Amer: 20 mL/min — ABNORMAL LOW (ref >60)
GFR calc non Af Amer: 18 mL/min — ABNORMAL LOW (ref >60)
GLUCOSE: 69 mg/dL (ref 65–99)
POTASSIUM: 4.5 mmol/L (ref 3.5–5.1)
SODIUM: 143 mmol/L (ref 135–145)
Total Bilirubin: 1.1 mg/dL (ref 0.3–1.2)
Total Protein: 6.7 g/dL (ref 6.5–8.1)

## 2017-03-24 LAB — CBC
HEMATOCRIT: 27.6 % — AB (ref 36.0–46.0)
Hemoglobin: 8.4 g/dL — ABNORMAL LOW (ref 12.0–15.0)
MCH: 22.5 pg — ABNORMAL LOW (ref 26.0–34.0)
MCHC: 30.4 g/dL (ref 30.0–36.0)
MCV: 74 fL — ABNORMAL LOW (ref 78.0–100.0)
PLATELETS: 479 10*3/uL — AB (ref 150–400)
RBC: 3.73 MIL/uL — ABNORMAL LOW (ref 3.87–5.11)
RDW: 24.1 % — AB (ref 11.5–15.5)
WBC: 12.8 10*3/uL — AB (ref 4.0–10.5)

## 2017-03-24 LAB — HIV ANTIBODY (ROUTINE TESTING W REFLEX): HIV Screen 4th Generation wRfx: NONREACTIVE

## 2017-03-24 MED ORDER — OXYCODONE HCL 5 MG PO TABS
10.0000 mg | ORAL_TABLET | ORAL | Status: DC | PRN
  Administered 2017-03-24 – 2017-03-27 (×15): 20 mg via ORAL
  Filled 2017-03-24 (×9): qty 4
  Filled 2017-03-24: qty 2
  Filled 2017-03-24 (×2): qty 4
  Filled 2017-03-24: qty 2
  Filled 2017-03-24 (×3): qty 4

## 2017-03-24 MED ORDER — ENOXAPARIN SODIUM 30 MG/0.3ML ~~LOC~~ SOLN
30.0000 mg | SUBCUTANEOUS | Status: DC
  Administered 2017-03-24 – 2017-03-26 (×3): 30 mg via SUBCUTANEOUS
  Filled 2017-03-24 (×3): qty 0.3

## 2017-03-24 NOTE — Progress Notes (Signed)
Spoke with Dr. [name], he has requested patient be transferred to his service. [facility] will sign off, but please call us if we can be of any assistance in the care of this patient.  Murray Hodgkins, MD [facility] [phone]

## 2017-03-24 NOTE — Plan of Care (Signed)
Problem: Bowel/Gastric: Goal: Will not experience complications related to bowel motility Outcome: Not Progressing Pt has not had a BM since 8/8

## 2017-03-24 NOTE — Progress Notes (Signed)
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 She has pain in the right abdomen. She does not feel ready to go home.  Objective: Vital signs in last 24 hours: Blood pressure (!) 142/89, pulse (!) 107, temperature 98.8 F (37.1 C), temperature source Oral, resp. rate 18, height 5' 5.5" (1.664 m), weight 145 lb 12.8 oz (66.1 kg), SpO2 99 %.  Intake/Output from previous day: 08/15 0701 - 08/16 0700 In: 1056.3 [P.O.:240; I.V.:816.3] Out: -   Physical Exam:  HEENT: No thrush Lungs: Clear bilaterally, no respiratory distress Cardiac: Regular rate and rhythm Abdomen: The liver is palpable throughout the upper abdomen Extremities: No leg edema   Portacath/PICC-without erythema  Lab Results:  Recent Labs  03/23/17 1830 03/24/17 0934  WBC 9.6 12.8*  HGB 8.4* 8.4*  HCT 27.5* 27.6*  PLT 396 479*    BMET  Recent Labs  03/23/17 1830 03/24/17 0934  NA 144 143  K 4.4 4.5  CL 108 107  CO2 24 21*  GLUCOSE 77 69  BUN 54* 57*  CREATININE 2.68* 2.83*  CALCIUM 8.9 9.0    Lab Results  Component Value Date   CEA1 964.24 (H) 03/17/2017    Studies/Results: X-ray Chest Pa And Lateral  Result Date: 03/23/2017 CLINICAL DATA:  Tachycardia EXAM: CHEST  2 VIEW COMPARISON:  02/19/2017, PET-CT 09/24/2016 FINDINGS: Right-sided central venous port tip overlies the proximal right atrium. Trace pleural effusion. Improved aeration of right base since the prior study. Stable enlarged cardiomediastinal silhouette. Slight asymmetric lucency along the right mediastinal border. IMPRESSION: 1. Trace bilateral effusions, improved aeration of right lung base since prior radiograph. Patchy residual atelectasis or infiltrate at the right base 2. Asymmetric linear lucency along the right mediastinal contour, probable artifact, with tiny anterior pneumothorax felt unlikely Electronically Signed   By: Donavan Foil M.D.   On: 03/23/2017 23:03     Medications: I have reviewed the patient's current medications.  Assessment/Plan:  1. Metastatic colon cancer-most recently treated with FOLFIRI/Avastin 03/03/2017 2. History of congestive heart failure 3. Anemia secondary to chemotherapy, chronic disease, and thalassemia trait 4. Pain secondary to metastatic colon cancer involving the liver 5. Admission 03/23/2017 with hypotension/tachycardia-secondary to dehydration 6. Acute renal failure-likely renal injury secondary to hypotension/dehydration 7. Elevated corrected calcium on 03/23/2017-improved  Her performance status appears partially improved today. The clinical presentation is likely secondary to progression of metastatic colon cancer. She does not appear to be a candidate for further systemic therapy. I recommend Hospice care. She does not wish to consider hospice at present.  I have a low clinical suspicion for an infection. Cultures are negative to date.  Plan: 1. Continue intravenous hydration, follow-up creatinine 2. Check ionized calcium on 03/25/2017 3. Oxycodone for pain 4. Continue discussions regarding goals of care 5. Lovenox for DVT prophylaxis   LOS: 1 day   [name], MD   03/24/2017, 1:31 PM

## 2017-03-25 ENCOUNTER — Ambulatory Visit (HOSPITAL_COMMUNITY): Payer: BLUE CROSS/BLUE SHIELD

## 2017-03-25 ENCOUNTER — Inpatient Hospital Stay (HOSPITAL_COMMUNITY): Payer: BLUE CROSS/BLUE SHIELD

## 2017-03-25 ENCOUNTER — Encounter (HOSPITAL_COMMUNITY): Payer: Self-pay | Admitting: Internal Medicine

## 2017-03-25 LAB — COMPREHENSIVE METABOLIC PANEL
ALK PHOS: 281 U/L — AB (ref 38–126)
ALT: 26 U/L (ref 14–54)
ANION GAP: 13 (ref 5–15)
AST: 71 U/L — ABNORMAL HIGH (ref 15–41)
Albumin: 2 g/dL — ABNORMAL LOW (ref 3.5–5.0)
BILIRUBIN TOTAL: 0.6 mg/dL (ref 0.3–1.2)
BUN: 57 mg/dL — ABNORMAL HIGH (ref 6–20)
CALCIUM: 8.7 mg/dL — AB (ref 8.9–10.3)
CO2: 20 mmol/L — ABNORMAL LOW (ref 22–32)
Chloride: 108 mmol/L (ref 101–111)
Creatinine, Ser: 2.53 mg/dL — ABNORMAL HIGH (ref 0.44–1.00)
GFR calc non Af Amer: 20 mL/min — ABNORMAL LOW (ref >60)
GFR, EST AFRICAN AMERICAN: 23 mL/min — AB (ref >60)
Glucose, Bld: 100 mg/dL — ABNORMAL HIGH (ref 65–99)
Potassium: 4.6 mmol/L (ref 3.5–5.1)
SODIUM: 141 mmol/L (ref 135–145)
TOTAL PROTEIN: 6.1 g/dL — AB (ref 6.5–8.1)

## 2017-03-25 LAB — CBC WITH DIFFERENTIAL/PLATELET
Basophils Absolute: 0 10*3/uL (ref 0.0–0.1)
Basophils Relative: 0 %
EOS ABS: 0 10*3/uL (ref 0.0–0.7)
Eosinophils Relative: 0 %
HCT: 25.7 % — ABNORMAL LOW (ref 36.0–46.0)
HEMOGLOBIN: 7.8 g/dL — AB (ref 12.0–15.0)
LYMPHS PCT: 8 %
Lymphs Abs: 0.8 10*3/uL (ref 0.7–4.0)
MCH: 22.3 pg — AB (ref 26.0–34.0)
MCHC: 30.4 g/dL (ref 30.0–36.0)
MCV: 73.4 fL — AB (ref 78.0–100.0)
Monocytes Absolute: 1.1 10*3/uL — ABNORMAL HIGH (ref 0.1–1.0)
Monocytes Relative: 11 %
NEUTROS PCT: 81 %
Neutro Abs: 8.2 10*3/uL — ABNORMAL HIGH (ref 1.7–7.7)
Platelets: 340 10*3/uL (ref 150–400)
RBC: 3.5 MIL/uL — ABNORMAL LOW (ref 3.87–5.11)
RDW: 23.5 % — ABNORMAL HIGH (ref 11.5–15.5)
WBC: 10.1 10*3/uL (ref 4.0–10.5)

## 2017-03-25 LAB — SAMPLE TO BLOOD BANK

## 2017-03-25 LAB — PTH, INTACT AND CALCIUM
CALCIUM TOTAL (PTH): 8.6 mg/dL — AB (ref 8.7–10.2)
PTH: 177 pg/mL — ABNORMAL HIGH (ref 15–65)

## 2017-03-25 LAB — CALCITRIOL (1,25 DI-OH VIT D): Vit D, 1,25-Dihydroxy: 6.3 pg/mL — ABNORMAL LOW (ref 19.9–79.3)

## 2017-03-25 LAB — PREPARE RBC (CROSSMATCH)

## 2017-03-25 LAB — VITAMIN D 25 HYDROXY (VIT D DEFICIENCY, FRACTURES): Vit D, 25-Hydroxy: 8.7 ng/mL — ABNORMAL LOW (ref 30.0–100.0)

## 2017-03-25 MED ORDER — SODIUM CHLORIDE 0.9 % IV SOLN
Freq: Once | INTRAVENOUS | Status: AC
Start: 2017-03-25 — End: 2017-03-25
  Administered 2017-03-25: 10:00:00 via INTRAVENOUS

## 2017-03-25 NOTE — Progress Notes (Signed)
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 The pain is relieved with oxycodone. She is not ambulating.  Objective: Vital signs in last 24 hours: Blood pressure (!) 150/92, pulse 81, temperature 98 F (36.7 C), temperature source Oral, resp. rate 16, height 5' 5.5" (1.664 m), weight 145 lb 12.8 oz (66.1 kg), SpO2 100 %.  Intake/Output from previous day: 08/16 0701 - 08/17 0700 In: 480 [P.O.:480] Out: 600 [Urine:600]  Physical Exam:  HEENT: No thrush Lungs: Clear bilaterally, no respiratory distress Cardiac: Regular rate and rhythm Abdomen: The liver is palpable throughout the upper abdomen Extremities: No leg edema Neurologic: Alert, follows commands, slow with memory recall and conversing about her diagnosis/lab values   Portacath/PICC-without erythema  Lab Results:  Recent Labs  03/24/17 0934 03/25/17 0500  WBC 12.8* 10.1  HGB 8.4* 7.8*  HCT 27.6* 25.7*  PLT 479* 340    BMET  Recent Labs  03/24/17 0934 03/25/17 0500  NA 143 141  K 4.5 4.6  CL 107 108  CO2 21* 20*  GLUCOSE 69 100*  BUN 57* 57*  CREATININE 2.83* 2.53*  CALCIUM 9.0 8.7*    Lab Results  Component Value Date   CEA1 964.24 (H) 03/17/2017    Studies/Results: X-ray Chest Pa And Lateral  Result Date: 03/23/2017 CLINICAL DATA:  Tachycardia EXAM: CHEST  2 VIEW COMPARISON:  02/19/2017, PET-CT 09/24/2016 FINDINGS: Right-sided central venous port tip overlies the proximal right atrium. Trace pleural effusion. Improved aeration of right base since the prior study. Stable enlarged cardiomediastinal silhouette. Slight asymmetric lucency along the right mediastinal border. IMPRESSION: 1. Trace bilateral effusions, improved aeration of right lung base since prior radiograph. Patchy residual atelectasis or infiltrate at the right base 2. Asymmetric linear lucency along the right mediastinal contour, probable artifact, with tiny anterior  pneumothorax felt unlikely Electronically Signed   By: Donavan Foil M.D.   On: 03/23/2017 23:03    Medications: I have reviewed the patient's current medications.  Assessment/Plan:  1. Metastatic colon cancer-most recently treated with FOLFIRI/Avastin 03/03/2017 2. History of congestive heart failure 3. Anemia secondary to chemotherapy, chronic disease, and thalassemia trait 4. Pain secondary to metastatic colon cancer involving the liver 5. Admission 03/23/2017 with hypotension/tachycardia-secondary to dehydration 6. Acute renal failure-likely renal injury secondary to hypotension/dehydration 7. Elevated corrected calcium on 03/23/2017-improved  She continues to have a poor performance status. We discussed the prognosis and goals of care. She does not wish to enter hospice care at present. She says she will consider hospice if her condition is not improved over the next 2 weeks. She would like to continue hydration and follow-up of the creatinine.  She appears mildly confused. I suspect this is related to narcotics, renal failure, and delirium. She does not acknowledge confusion.  I discussed the poor prognosis with her husband. I explained her lifespan could be measured in days or weeks.  We will follow-up on the ionized calcium level from today. I will check an ammonia level on 03/26/2017.  She feels the red cell transfusion helped last week. We will transfuse red cells today.    Plan: 1. Continue intravenous hydration, follow-up creatinine 2. Check ionized calcium today and administer biphosphonate therapy is indicated 3. Continue Oxycodone for pain 4. Renal ultrasound 5. Lovenox for DVT prophylaxis 5. Transfuse 2 units of packed red blood cells   LOS: 2 days   [name], MD   03/25/2017, 8:46 AM

## 2017-03-25 NOTE — Care Management Note (Signed)
Case Management Note  Patient Details  Name: Christina [name] MRN: [id number] Date of Birth: 1961-01-04  Subjective/Objective:     56 yo admitted with SIRS. Hx of metastatic colon cancer               Action/Plan: Home. Per MD note pt is declining hospice services at this time. CM will continue to follow.  Expected Discharge Date:   (unknown)               Expected Discharge Plan:  Home/Self Care  In-House Referral:     Discharge planning Services  CM Consult  Post Acute Care Choice:    Choice offered to:     DME Arranged:    DME Agency:     HH Arranged:    HH Agency:     Status of Service:  In process, will continue to follow  If discussed at Long Length of Stay Meetings, dates discussed:    Additional CommentsLynnell Catalan, RN 03/25/2017, 10:53 AM  [phone]

## 2017-03-26 LAB — COMPREHENSIVE METABOLIC PANEL
ALBUMIN: 2.1 g/dL — AB (ref 3.5–5.0)
ALK PHOS: 371 U/L — AB (ref 38–126)
ALT: 29 U/L (ref 14–54)
ANION GAP: 12 (ref 5–15)
AST: 80 U/L — ABNORMAL HIGH (ref 15–41)
BUN: 54 mg/dL — ABNORMAL HIGH (ref 6–20)
CALCIUM: 8.9 mg/dL (ref 8.9–10.3)
CHLORIDE: 111 mmol/L (ref 101–111)
CO2: 21 mmol/L — AB (ref 22–32)
Creatinine, Ser: 1.95 mg/dL — ABNORMAL HIGH (ref 0.44–1.00)
GFR calc Af Amer: 32 mL/min — ABNORMAL LOW (ref >60)
GFR calc non Af Amer: 28 mL/min — ABNORMAL LOW (ref >60)
GLUCOSE: 103 mg/dL — AB (ref 65–99)
Potassium: 4.4 mmol/L (ref 3.5–5.1)
SODIUM: 144 mmol/L (ref 135–145)
Total Bilirubin: 1 mg/dL (ref 0.3–1.2)
Total Protein: 6.6 g/dL (ref 6.5–8.1)

## 2017-03-26 LAB — TYPE AND SCREEN
ABO/RH(D): O NEG
Antibody Screen: NEGATIVE
UNIT DIVISION: 0
Unit division: 0

## 2017-03-26 LAB — BPAM RBC
BLOOD PRODUCT EXPIRATION DATE: 201808272359
Blood Product Expiration Date: 201809032359
ISSUE DATE / TIME: 201808171339
ISSUE DATE / TIME: 201808171618
UNIT TYPE AND RH: 9500
Unit Type and Rh: 9500

## 2017-03-26 LAB — CBC
HCT: 36.4 % (ref 36.0–46.0)
Hemoglobin: 11.9 g/dL — ABNORMAL LOW (ref 12.0–15.0)
MCH: 24.9 pg — AB (ref 26.0–34.0)
MCHC: 32.7 g/dL (ref 30.0–36.0)
MCV: 76.2 fL — ABNORMAL LOW (ref 78.0–100.0)
PLATELETS: 369 10*3/uL (ref 150–400)
RBC: 4.78 MIL/uL (ref 3.87–5.11)
RDW: 22 % — ABNORMAL HIGH (ref 11.5–15.5)
WBC: 15.2 10*3/uL — ABNORMAL HIGH (ref 4.0–10.5)

## 2017-03-26 LAB — AMMONIA: AMMONIA: 30 umol/L (ref 9–35)

## 2017-03-26 MED ORDER — SODIUM CHLORIDE 0.9 % IV SOLN
INTRAVENOUS | Status: DC
  Administered 2017-03-26 (×3): via INTRAVENOUS

## 2017-03-26 MED ORDER — DEXAMETHASONE 4 MG PO TABS
4.0000 mg | ORAL_TABLET | Freq: Two times a day (BID) | ORAL | Status: DC
  Administered 2017-03-26 – 2017-03-27 (×3): 4 mg via ORAL
  Filled 2017-03-26 (×4): qty 1

## 2017-03-26 NOTE — Progress Notes (Signed)
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 She plans to eat this morning. The pain is adequately controlled with oxycodone. She would like to increase the Decadron dose. Objective: Vital signs in last 24 hours: Blood pressure (!) 144/91, pulse 95, temperature 98 F (36.7 C), temperature source Oral, resp. rate 16, height 5' 5.5" (1.664 m), weight 145 lb 12.8 oz (66.1 kg), SpO2 100 %.  Intake/Output from previous day: 08/17 0701 - 08/18 0700 In: 1250 [P.O.:360; I.V.:60; Blood:830] Out: 550 [Urine:550]  Physical Exam:  HEENT: No thrush Lungs: Clear bilaterally, no respiratory distress Cardiac: Regular rate and rhythm, 2/6 diastolic murmur Abdomen: The liver is palpable throughout the upper abdomen Extremities: No leg edema Neurologic: More alert and conversant today.   Portacath/PICC-without erythema  Lab Results:  Recent Labs  03/25/17 0500 03/26/17 0412  WBC 10.1 15.2*  HGB 7.8* 11.9*  HCT 25.7* 36.4  PLT 340 369    BMET  Recent Labs  03/25/17 0500 03/26/17 0412  NA 141 144  K 4.6 4.4  CL 108 111  CO2 20* 21*  GLUCOSE 100* 103*  BUN 57* 54*  CREATININE 2.53* 1.95*  CALCIUM 8.7* 8.9   Ammonia-30 Lab Results  Component Value Date   CEA1 964.24 (H) 03/17/2017    Studies/Results: US Renal  Result Date: 03/25/2017 CLINICAL DATA:  56 year old female with renal failure. History of metastatic colon cancer. EXAM: RENAL / URINARY TRACT ULTRASOUND COMPLETE COMPARISON:  CT 02/21/2017 FINDINGS: Right Kidney: Length: 11.4 cm. Echogenicity within normal limits. No mass or hydronephrosis visualized. Left Kidney: Length: 11.3 cm. Echogenicity within normal limits. No mass or hydronephrosis visualized. Bladder: Poorly distended urinary bladder is not well evaluated. IMPRESSION: 1. Unremarkable sonographic evaluation of the bilateral kidneys. No evidence for obstruction/hydronephrosis. 2. Incomplete evaluation of  the decompressed urinary bladder. Electronically Signed   By: Kristopher Oppenheim M.D.   On: 03/25/2017 13:31    Medications: I have reviewed the patient's current medications.  Assessment/Plan:  1. Metastatic colon cancer-most recently treated with FOLFIRI/Avastin 03/03/2017 2. History of congestive heart failure 3. Anemia secondary to chemotherapy, chronic disease, and thalassemia trait, status post a red cell transfusion 03/25/2017 4. Pain secondary to metastatic colon cancer involving the liver 5. Admission 03/23/2017 with hypotension/tachycardia-secondary to dehydration 6. Acute renal failure-likely renal injury secondary to hypotension/dehydration, negative renal ultrasound, improved 7. Elevated corrected calcium on 03/23/2017-improved  She appears improved over the past 2 days. The renal failure is improving. She is more alert and she does not appear confused this morning.  The plan is to increase ambulation and diet today. She is hopeful for discharge to home on 03/27/2017.   Plan: 1. Continue intravenous hydration, follow-up creatinine 2. Continue Oxycodone for pain 3. Lovenox for DVT prophylaxis    LOS: 3 days   [name], MD   03/26/2017, 8:12 AM

## 2017-03-27 LAB — COMPREHENSIVE METABOLIC PANEL
ALT: 35 U/L (ref 14–54)
ANION GAP: 8 (ref 5–15)
AST: 104 U/L — ABNORMAL HIGH (ref 15–41)
Albumin: 2.1 g/dL — ABNORMAL LOW (ref 3.5–5.0)
Alkaline Phosphatase: 396 U/L — ABNORMAL HIGH (ref 38–126)
BUN: 48 mg/dL — ABNORMAL HIGH (ref 6–20)
CHLORIDE: 117 mmol/L — AB (ref 101–111)
CO2: 22 mmol/L (ref 22–32)
CREATININE: 1.62 mg/dL — AB (ref 0.44–1.00)
Calcium: 8.8 mg/dL — ABNORMAL LOW (ref 8.9–10.3)
GFR, EST AFRICAN AMERICAN: 40 mL/min — AB (ref >60)
GFR, EST NON AFRICAN AMERICAN: 34 mL/min — AB (ref >60)
Glucose, Bld: 108 mg/dL — ABNORMAL HIGH (ref 65–99)
POTASSIUM: 4.4 mmol/L (ref 3.5–5.1)
SODIUM: 147 mmol/L — AB (ref 135–145)
Total Bilirubin: 0.9 mg/dL (ref 0.3–1.2)
Total Protein: 6.2 g/dL — ABNORMAL LOW (ref 6.5–8.1)

## 2017-03-27 LAB — URINALYSIS, COMPLETE (UACMP) WITH MICROSCOPIC
BILIRUBIN URINE: NEGATIVE
GLUCOSE, UA: NEGATIVE mg/dL
KETONES UR: NEGATIVE mg/dL
Leukocytes, UA: NEGATIVE
NITRITE: NEGATIVE
PH: 5 (ref 5.0–8.0)
Protein, ur: 100 mg/dL — AB
SPECIFIC GRAVITY, URINE: 1.019 (ref 1.005–1.030)
WBC UA: NONE SEEN WBC/hpf (ref 0–5)

## 2017-03-27 LAB — CBC
HCT: 35.6 % — ABNORMAL LOW (ref 36.0–46.0)
Hemoglobin: 11.5 g/dL — ABNORMAL LOW (ref 12.0–15.0)
MCH: 25.1 pg — AB (ref 26.0–34.0)
MCHC: 32.3 g/dL (ref 30.0–36.0)
MCV: 77.7 fL — AB (ref 78.0–100.0)
PLATELETS: 322 10*3/uL (ref 150–400)
RBC: 4.58 MIL/uL (ref 3.87–5.11)
RDW: 22.1 % — AB (ref 11.5–15.5)
WBC: 14.5 10*3/uL — ABNORMAL HIGH (ref 4.0–10.5)

## 2017-03-27 MED ORDER — OXYCODONE HCL 10 MG PO TABS: 10.0000 mg | ORAL_TABLET | ORAL | 0 refills | Status: AC | PRN

## 2017-03-27 MED ORDER — HEPARIN SOD (PORK) LOCK FLUSH 100 UNIT/ML IV SOLN
500.0000 [IU] | INTRAVENOUS | Status: AC | PRN
  Administered 2017-03-27: 500 [IU]

## 2017-03-27 NOTE — Discharge Summary (Signed)
Physician Discharge Summary  Patient ID: Christina [name] @[email or web address] @ MRN: [id number] DOB/AGE: September 21, 1960 56 y.o.  Admit date: 03/23/2017 Discharge date: 03/27/2017  Discharge Diagnoses:  1. Metastatic colon cancer 2. Dehydration 3. Acute renal failure 4. Hypercalcemia 5. Pain secondary to metastatic colon cancer 6. History of congestive heart failure   Discharged Condition: Improved  Discharge Labs:  BUN 48, creatinine 1.62, calcium 8.8, albumin 2.1  Significant Diagnostic Studies: None  Consults: None  Procedures:  Red cell transfusion  Disposition: 01-Home or Self Care   Allergies as of 03/27/2017      Reactions   Lactose Intolerance (gi) Other (See Comments)   Diarrhea, Bloated, Abdominal pain.   Morphine And Related Anaphylaxis   Sedation   Dilaudid [hydromorphone Hcl]    Severe cramping    Lasix [furosemide] Nausea And Vomiting   Problems with IV only   Lorazepam Other (See Comments)   Pt couldn't walk straight, put her in a daze      Medication List    STOP taking these medications   ibuprofen 200 MG tablet Commonly known as:  ADVIL,MOTRIN   losartan 25 MG tablet Commonly known as:  COZAAR     TAKE these medications   carvedilol 6.25 MG tablet Commonly known as:  COREG Take 6.25 mg by mouth daily.   dexamethasone 4 MG tablet Commonly known as:  DECADRON Take 1 tablet (4 mg total) by mouth daily.   lidocaine-prilocaine cream Commonly known as:  EMLA Apply to port site one hour prior to use. Do not rub in. Cover with plastic.   magic mouthwash Soln Equal parts Nystatin, Diphenhydramine and Maalax four times a day as needed, 5-10 ml swish and spit   ondansetron 4 MG tablet Commonly known as:  ZOFRAN Take 1 tablet (4 mg total) by mouth every 8 (eight) hours as needed for nausea or vomiting.   Oxycodone HCl 10 MG Tabs Take 1-2 tablets (10-20 mg total) by mouth every 4 (four) hours as needed. What changed:  how much to take  reasons to  take this  Another medication with the same name was removed. Continue taking this medication, and follow the directions you see here.   prochlorperazine 10 MG tablet Commonly known as:  COMPAZINE Take 1 tablet (10 mg total) by mouth every 6 (six) hours as needed for nausea or vomiting.       Follow-up Information    Ladell Pier, MD Follow up.   Specialty:  Oncology Why:  03/31/2017 as scheduled Contact information: [place] [id number] [phone]           Hospital Course: Christina [name] was admitted 03/23/2017 with failure to thrive in the setting of advanced metastatic colon cancer. She was noted to have hypotension and an elevated BUN/creatinine on hospital admission. The corrected calcium level was elevated.  She was placed on intravenous hydration. The hypotension resolved. The creatinine remain elevated despite intravenous hydration. I suspect she has developed renal failure related to hypotension and dehydration. The creatinine was improved on the day of discharge. Christina [name] appeared weak and at times confused during this admission. The ammonia level returned normal and the calcium improved with hydration. I suspect the confusion was related to polypharmacy and delirium from hospitalization/critical illness. Her mental status improved over the last 2 days of this hospital admission.  She continues oxycodone for pain.  She had pyuria on hospital admission. She did not have symptoms of urinary tract infection. A urinalysis and culture  will be obtained on the day of discharge.  I had multiple discussions with Christina [name] regarding the poor prognosis and disposition plans. She elected to remain on a full CODE STATUS and does not wish to consider Hospice care at present. She will follow-up as an outpatient on 03/31/2017 for further discussion.  Discharge examination 03/27/2017: HEENT-no thrush Lungs-clear bilaterally Cardiac-regular rate  and ANVBTY-6/0 systolic murmur Abdomen-the liver is palpable throughout the upper abdomen Vascular-trace low leg edema bilaterally Neurologic-alert and oriented  She appeared stable for discharge to home on the morning of 03/27/2017.       Signed: [name], MD 03/27/2017, 7:25 AM

## 2017-03-27 NOTE — Discharge Instructions (Signed)
Call for increased pain or shortness of breath

## 2017-03-28 LAB — CULTURE, BLOOD (ROUTINE X 2)
CULTURE: NO GROWTH
Culture: NO GROWTH
Special Requests: ADEQUATE
Special Requests: ADEQUATE

## 2017-03-28 LAB — URINE CULTURE: Culture: <10000 — AB

## 2017-03-29 LAB — PTH-RELATED PEPTIDE: PTH-related peptide: <2 pmol/L

## 2017-03-31 ENCOUNTER — Other Ambulatory Visit: Payer: Self-pay

## 2017-03-31 ENCOUNTER — Telehealth: Payer: Self-pay | Admitting: *Deleted

## 2017-03-31 ENCOUNTER — Ambulatory Visit: Payer: Self-pay | Admitting: Oncology

## 2017-03-31 ENCOUNTER — Ambulatory Visit: Payer: Self-pay

## 2017-03-31 NOTE — Telephone Encounter (Signed)
Called pt re: missed appt today. She wasn't aware today was Thursday. She asked to reschedule for a week from today. OK, per Dr. [name]. Message to schedulers for appt.

## 2017-04-01 ENCOUNTER — Telehealth: Payer: Self-pay | Admitting: Oncology

## 2017-04-01 NOTE — Telephone Encounter (Signed)
Spoke with patient regarding their added appt on 8/30.

## 2017-04-05 ENCOUNTER — Emergency Department (HOSPITAL_COMMUNITY)
Admit: 2017-04-05 | Discharge: 2017-04-05 | Disposition: A | Discharge status: Home / Self Care | Payer: BLUE CROSS/BLUE SHIELD | Attending: Internal Medicine | Admitting: Internal Medicine

## 2017-04-05 ENCOUNTER — Encounter (HOSPITAL_COMMUNITY): Payer: Self-pay

## 2017-04-05 ENCOUNTER — Inpatient Hospital Stay (HOSPITAL_COMMUNITY)
Admission: EM | Admit: 2017-04-05 | Discharge: 2017-05-09 | DRG: 871 | Disposition: E | Payer: BLUE CROSS/BLUE SHIELD | Attending: Internal Medicine | Admitting: Internal Medicine

## 2017-04-05 ENCOUNTER — Telehealth: Payer: Self-pay

## 2017-04-05 ENCOUNTER — Inpatient Hospital Stay (HOSPITAL_COMMUNITY): Payer: BLUE CROSS/BLUE SHIELD

## 2017-04-05 ENCOUNTER — Emergency Department (HOSPITAL_COMMUNITY): Payer: BLUE CROSS/BLUE SHIELD

## 2017-04-05 DIAGNOSIS — Z515 Encounter for palliative care: Secondary | ICD-10-CM | POA: Diagnosis not present

## 2017-04-05 DIAGNOSIS — C189 Malignant neoplasm of colon, unspecified: Secondary | ICD-10-CM | POA: Diagnosis present

## 2017-04-05 DIAGNOSIS — N179 Acute kidney failure, unspecified: Secondary | ICD-10-CM | POA: Diagnosis present

## 2017-04-05 DIAGNOSIS — I959 Hypotension, unspecified: Secondary | ICD-10-CM

## 2017-04-05 DIAGNOSIS — Z8701 Personal history of pneumonia (recurrent): Secondary | ICD-10-CM | POA: Diagnosis not present

## 2017-04-05 DIAGNOSIS — Z7189 Other specified counseling: Secondary | ICD-10-CM | POA: Diagnosis not present

## 2017-04-05 DIAGNOSIS — R600 Localized edema: Secondary | ICD-10-CM

## 2017-04-05 DIAGNOSIS — D500 Iron deficiency anemia secondary to blood loss (chronic): Secondary | ICD-10-CM | POA: Diagnosis present

## 2017-04-05 DIAGNOSIS — R1110 Vomiting, unspecified: Secondary | ICD-10-CM | POA: Diagnosis present

## 2017-04-05 DIAGNOSIS — E872 Acidosis: Secondary | ICD-10-CM | POA: Diagnosis present

## 2017-04-05 DIAGNOSIS — I110 Hypertensive heart disease with heart failure: Secondary | ICD-10-CM | POA: Diagnosis present

## 2017-04-05 DIAGNOSIS — K7290 Hepatic failure, unspecified without coma: Secondary | ICD-10-CM | POA: Diagnosis present

## 2017-04-05 DIAGNOSIS — Z885 Allergy status to narcotic agent status: Secondary | ICD-10-CM

## 2017-04-05 DIAGNOSIS — R17 Unspecified jaundice: Secondary | ICD-10-CM

## 2017-04-05 DIAGNOSIS — E875 Hyperkalemia: Secondary | ICD-10-CM | POA: Diagnosis not present

## 2017-04-05 DIAGNOSIS — Z7952 Long term (current) use of systemic steroids: Secondary | ICD-10-CM

## 2017-04-05 DIAGNOSIS — E739 Lactose intolerance, unspecified: Secondary | ICD-10-CM | POA: Diagnosis present

## 2017-04-05 DIAGNOSIS — C7989 Secondary malignant neoplasm of other specified sites: Secondary | ICD-10-CM | POA: Diagnosis present

## 2017-04-05 DIAGNOSIS — A419 Sepsis, unspecified organism: Secondary | ICD-10-CM | POA: Diagnosis present

## 2017-04-05 DIAGNOSIS — G9340 Encephalopathy, unspecified: Secondary | ICD-10-CM | POA: Diagnosis not present

## 2017-04-05 DIAGNOSIS — R6510 Systemic inflammatory response syndrome (SIRS) of non-infectious origin without acute organ dysfunction: Secondary | ICD-10-CM | POA: Diagnosis present

## 2017-04-05 DIAGNOSIS — R609 Edema, unspecified: Secondary | ICD-10-CM | POA: Diagnosis not present

## 2017-04-05 DIAGNOSIS — E860 Dehydration: Secondary | ICD-10-CM | POA: Diagnosis present

## 2017-04-05 DIAGNOSIS — R180 Malignant ascites: Secondary | ICD-10-CM | POA: Diagnosis present

## 2017-04-05 DIAGNOSIS — R000 Tachycardia, unspecified: Secondary | ICD-10-CM

## 2017-04-05 DIAGNOSIS — I5022 Chronic systolic (congestive) heart failure: Secondary | ICD-10-CM | POA: Diagnosis present

## 2017-04-05 DIAGNOSIS — G9341 Metabolic encephalopathy: Secondary | ICD-10-CM | POA: Diagnosis present

## 2017-04-05 DIAGNOSIS — N19 Unspecified kidney failure: Secondary | ICD-10-CM | POA: Diagnosis not present

## 2017-04-05 DIAGNOSIS — R531 Weakness: Secondary | ICD-10-CM

## 2017-04-05 DIAGNOSIS — Z66 Do not resuscitate: Secondary | ICD-10-CM | POA: Diagnosis present

## 2017-04-05 DIAGNOSIS — I96 Gangrene, not elsewhere classified: Secondary | ICD-10-CM | POA: Diagnosis present

## 2017-04-05 DIAGNOSIS — N390 Urinary tract infection, site not specified: Secondary | ICD-10-CM | POA: Diagnosis present

## 2017-04-05 DIAGNOSIS — D649 Anemia, unspecified: Secondary | ICD-10-CM | POA: Diagnosis not present

## 2017-04-05 DIAGNOSIS — R740 Nonspecific elevation of levels of transaminase and lactic acid dehydrogenase [LDH]: Secondary | ICD-10-CM

## 2017-04-05 DIAGNOSIS — C787 Secondary malignant neoplasm of liver and intrahepatic bile duct: Secondary | ICD-10-CM | POA: Diagnosis present

## 2017-04-05 DIAGNOSIS — R7401 Elevation of levels of liver transaminase levels: Secondary | ICD-10-CM

## 2017-04-05 DIAGNOSIS — C7802 Secondary malignant neoplasm of left lung: Secondary | ICD-10-CM | POA: Diagnosis present

## 2017-04-05 DIAGNOSIS — Z79899 Other long term (current) drug therapy: Secondary | ICD-10-CM | POA: Diagnosis not present

## 2017-04-05 DIAGNOSIS — D6481 Anemia due to antineoplastic chemotherapy: Secondary | ICD-10-CM | POA: Diagnosis present

## 2017-04-05 DIAGNOSIS — C7801 Secondary malignant neoplasm of right lung: Secondary | ICD-10-CM | POA: Diagnosis present

## 2017-04-05 DIAGNOSIS — N809 Endometriosis, unspecified: Secondary | ICD-10-CM | POA: Diagnosis present

## 2017-04-05 DIAGNOSIS — G893 Neoplasm related pain (acute) (chronic): Secondary | ICD-10-CM

## 2017-04-05 DIAGNOSIS — R4182 Altered mental status, unspecified: Secondary | ICD-10-CM | POA: Diagnosis not present

## 2017-04-05 DIAGNOSIS — I10 Essential (primary) hypertension: Secondary | ICD-10-CM | POA: Diagnosis present

## 2017-04-05 DIAGNOSIS — R627 Adult failure to thrive: Secondary | ICD-10-CM | POA: Diagnosis present

## 2017-04-05 DIAGNOSIS — T451X5A Adverse effect of antineoplastic and immunosuppressive drugs, initial encounter: Secondary | ICD-10-CM | POA: Diagnosis present

## 2017-04-05 DIAGNOSIS — D561 Beta thalassemia: Secondary | ICD-10-CM | POA: Diagnosis present

## 2017-04-05 DIAGNOSIS — Z888 Allergy status to other drugs, medicaments and biological substances status: Secondary | ICD-10-CM

## 2017-04-05 LAB — URINALYSIS, ROUTINE W REFLEX MICROSCOPIC
Glucose, UA: NEGATIVE mg/dL
Nitrite: POSITIVE — AB
Protein, ur: 100 mg/dL — AB
Specific Gravity, Urine: >1.03 — ABNORMAL HIGH (ref 1.005–1.030)
pH: 6.5 (ref 5.0–8.0)

## 2017-04-05 LAB — URINALYSIS, MICROSCOPIC (REFLEX)

## 2017-04-05 LAB — COMPREHENSIVE METABOLIC PANEL
ALT: 95 U/L — ABNORMAL HIGH (ref 14–54)
AST: 353 U/L — ABNORMAL HIGH (ref 15–41)
Albumin: 2.2 g/dL — ABNORMAL LOW (ref 3.5–5.0)
Alkaline Phosphatase: 482 U/L — ABNORMAL HIGH (ref 38–126)
Anion gap: 16 — ABNORMAL HIGH (ref 5–15)
BUN: 87 mg/dL — ABNORMAL HIGH (ref 6–20)
CO2: 15 mmol/L — ABNORMAL LOW (ref 22–32)
Calcium: 9.3 mg/dL (ref 8.9–10.3)
Chloride: 115 mmol/L — ABNORMAL HIGH (ref 101–111)
Creatinine, Ser: 2.42 mg/dL — ABNORMAL HIGH (ref 0.44–1.00)
GFR calc Af Amer: 25 mL/min — ABNORMAL LOW (ref >60)
GFR calc non Af Amer: 21 mL/min — ABNORMAL LOW (ref >60)
Glucose, Bld: 94 mg/dL (ref 65–99)
Potassium: 6.5 mmol/L (ref 3.5–5.1)
Sodium: 146 mmol/L — ABNORMAL HIGH (ref 135–145)
Total Bilirubin: 4.6 mg/dL — ABNORMAL HIGH (ref 0.3–1.2)
Total Protein: 6.6 g/dL (ref 6.5–8.1)

## 2017-04-05 LAB — PROTIME-INR
INR: 3.93
Prothrombin Time: 38.2 seconds — ABNORMAL HIGH (ref 11.4–15.2)

## 2017-04-05 LAB — BASIC METABOLIC PANEL
ANION GAP: 13 (ref 5–15)
BUN: 83 mg/dL — ABNORMAL HIGH (ref 6–20)
CALCIUM: 8.3 mg/dL — AB (ref 8.9–10.3)
CO2: 15 mmol/L — AB (ref 22–32)
Chloride: 117 mmol/L — ABNORMAL HIGH (ref 101–111)
Creatinine, Ser: 2.17 mg/dL — ABNORMAL HIGH (ref 0.44–1.00)
GFR, EST AFRICAN AMERICAN: 28 mL/min — AB (ref >60)
GFR, EST NON AFRICAN AMERICAN: 24 mL/min — AB (ref >60)
Glucose, Bld: 138 mg/dL — ABNORMAL HIGH (ref 65–99)
POTASSIUM: 5.3 mmol/L — AB (ref 3.5–5.1)
Sodium: 145 mmol/L (ref 135–145)

## 2017-04-05 LAB — CBC WITH DIFFERENTIAL/PLATELET
Basophils Absolute: 0 10*3/uL (ref 0.0–0.1)
Basophils Relative: 0 %
Eosinophils Absolute: 0 10*3/uL (ref 0.0–0.7)
Eosinophils Relative: 0 %
HCT: 36.3 % (ref 36.0–46.0)
Hemoglobin: 11.3 g/dL — ABNORMAL LOW (ref 12.0–15.0)
Lymphocytes Relative: 5 %
Lymphs Abs: 1.3 10*3/uL (ref 0.7–4.0)
MCH: 24.5 pg — ABNORMAL LOW (ref 26.0–34.0)
MCHC: 31.1 g/dL (ref 30.0–36.0)
MCV: 78.6 fL (ref 78.0–100.0)
Monocytes Absolute: 1.3 10*3/uL — ABNORMAL HIGH (ref 0.1–1.0)
Monocytes Relative: 5 %
Neutro Abs: 22.9 10*3/uL — ABNORMAL HIGH (ref 1.7–7.7)
Neutrophils Relative %: 90 %
Platelets: 476 10*3/uL — ABNORMAL HIGH (ref 150–400)
RBC: 4.62 MIL/uL (ref 3.87–5.11)
RDW: 23.7 % — ABNORMAL HIGH (ref 11.5–15.5)
WBC: 25.5 10*3/uL — ABNORMAL HIGH (ref 4.0–10.5)

## 2017-04-05 LAB — AMMONIA: Ammonia: 56 umol/L — ABNORMAL HIGH (ref 9–35)

## 2017-04-05 LAB — LACTIC ACID, PLASMA
LACTIC ACID, VENOUS: 3.8 mmol/L — AB (ref 0.5–1.9)
Lactic Acid, Venous: 3.7 mmol/L (ref 0.5–1.9)

## 2017-04-05 LAB — APTT: aPTT: 42 seconds — ABNORMAL HIGH (ref 24–36)

## 2017-04-05 MED ORDER — SODIUM CHLORIDE 0.9 % IV BOLUS (SEPSIS)
1000.0000 mL | Freq: Once | INTRAVENOUS | Status: AC
  Administered 2017-04-05: 1000 mL via INTRAVENOUS

## 2017-04-05 MED ORDER — MORPHINE SULFATE 2 MG/ML IJ SOLN
2.0000 mg | INTRAMUSCULAR | Status: DC | PRN
  Administered 2017-04-05: 4 mg via INTRAVENOUS
  Filled 2017-04-05 (×3): qty 2

## 2017-04-05 MED ORDER — VANCOMYCIN HCL IN DEXTROSE 750-5 MG/150ML-% IV SOLN
750.0000 mg | INTRAVENOUS | Status: DC
  Administered 2017-04-06: 750 mg via INTRAVENOUS
  Filled 2017-04-05: qty 150

## 2017-04-05 MED ORDER — ONDANSETRON HCL 4 MG/2ML IJ SOLN
4.0000 mg | Freq: Four times a day (QID) | INTRAMUSCULAR | Status: DC | PRN
  Administered 2017-04-05 – 2017-04-10 (×3): 4 mg via INTRAVENOUS
  Filled 2017-04-05 (×3): qty 2

## 2017-04-05 MED ORDER — DEXTROSE-NACL 5-0.45 % IV SOLN
INTRAVENOUS | Status: DC
  Administered 2017-04-05 – 2017-04-07 (×3): via INTRAVENOUS
  Administered 2017-04-07: 100 mL via INTRAVENOUS
  Administered 2017-04-07: 100 mL/h via INTRAVENOUS
  Administered 2017-04-08: 08:00:00 via INTRAVENOUS
  Administered 2017-04-09: 100 mL via INTRAVENOUS
  Administered 2017-04-09 (×2): via INTRAVENOUS
  Administered 2017-04-10: 100 mL via INTRAVENOUS
  Administered 2017-04-10: 16:00:00 via INTRAVENOUS
  Administered 2017-04-11: 100 mL/h via INTRAVENOUS
  Administered 2017-04-11 – 2017-04-15 (×7): via INTRAVENOUS

## 2017-04-05 MED ORDER — SODIUM CHLORIDE 0.9 % IV SOLN
INTRAVENOUS | Status: DC
  Administered 2017-04-05: 13:00:00 via INTRAVENOUS

## 2017-04-05 MED ORDER — INSULIN ASPART 100 UNIT/ML ~~LOC~~ SOLN
10.0000 [IU] | Freq: Once | SUBCUTANEOUS | Status: AC
  Administered 2017-04-05: 10 [IU] via INTRAVENOUS
  Filled 2017-04-05: qty 1

## 2017-04-05 MED ORDER — DEXTROSE 50 % IV SOLN
50.0000 mL | Freq: Once | INTRAVENOUS | Status: AC
  Administered 2017-04-05: 50 mL via INTRAVENOUS
  Filled 2017-04-05: qty 50

## 2017-04-05 MED ORDER — SODIUM BICARBONATE 8.4 % IV SOLN
50.0000 meq | Freq: Once | INTRAVENOUS | Status: AC
  Administered 2017-04-05: 50 meq via INTRAVENOUS
  Filled 2017-04-05: qty 50

## 2017-04-05 MED ORDER — PHYTONADIONE 5 MG PO TABS
10.0000 mg | ORAL_TABLET | Freq: Once | ORAL | Status: DC
  Filled 2017-04-05: qty 2

## 2017-04-05 MED ORDER — BISACODYL 10 MG RE SUPP: 10.0000 mg | Freq: Every day | RECTAL | Status: DC | PRN

## 2017-04-05 MED ORDER — VITAMIN K1 10 MG/ML IJ SOLN
10.0000 mg | Freq: Once | INTRAVENOUS | Status: AC
  Administered 2017-04-05: 10 mg via INTRAVENOUS
  Filled 2017-04-05: qty 1

## 2017-04-05 MED ORDER — SODIUM CHLORIDE 0.9 % IV BOLUS (SEPSIS)
1000.0000 mL | Freq: Once | INTRAVENOUS | Status: AC
Start: 2017-04-05 — End: 2017-04-05
  Administered 2017-04-05: 1000 mL via INTRAVENOUS

## 2017-04-05 MED ORDER — DEXAMETHASONE SODIUM PHOSPHATE 4 MG/ML IJ SOLN
4.0000 mg | Freq: Two times a day (BID) | INTRAMUSCULAR | Status: DC
  Administered 2017-04-05 – 2017-04-14 (×19): 4 mg via INTRAVENOUS
  Filled 2017-04-05 (×19): qty 1

## 2017-04-05 MED ORDER — PIPERACILLIN-TAZOBACTAM 3.375 G IVPB
3.3750 g | Freq: Three times a day (TID) | INTRAVENOUS | Status: DC
  Administered 2017-04-05 – 2017-04-08 (×8): 3.375 g via INTRAVENOUS
  Filled 2017-04-05 (×8): qty 50

## 2017-04-05 MED ORDER — PIPERACILLIN-TAZOBACTAM 3.375 G IVPB 30 MIN
3.3750 g | Freq: Once | INTRAVENOUS | Status: AC
  Administered 2017-04-05: 3.375 g via INTRAVENOUS
  Filled 2017-04-05: qty 50

## 2017-04-05 MED ORDER — ENOXAPARIN SODIUM 30 MG/0.3ML ~~LOC~~ SOLN: 30.0000 mg | SUBCUTANEOUS | Status: DC

## 2017-04-05 MED ORDER — VANCOMYCIN HCL IN DEXTROSE 1-5 GM/200ML-% IV SOLN
1000.0000 mg | Freq: Once | INTRAVENOUS | Status: AC
  Administered 2017-04-05: 1000 mg via INTRAVENOUS
  Filled 2017-04-05: qty 200

## 2017-04-05 MED ORDER — FENTANYL CITRATE (PF) 100 MCG/2ML IJ SOLN
25.0000 ug | INTRAMUSCULAR | Status: DC | PRN
Start: 2017-04-05 — End: 2017-04-06
  Administered 2017-04-05 (×2): 25 ug via INTRAVENOUS
  Filled 2017-04-05 (×2): qty 2

## 2017-04-05 MED ORDER — CEFTRIAXONE SODIUM 1 G IJ SOLR
1.0000 g | Freq: Once | INTRAMUSCULAR | Status: AC
  Administered 2017-04-05: 1 g via INTRAVENOUS
  Filled 2017-04-05: qty 10

## 2017-04-05 MED ORDER — ONDANSETRON HCL 4 MG PO TABS: 4.0000 mg | ORAL_TABLET | Freq: Four times a day (QID) | ORAL | Status: DC | PRN

## 2017-04-05 NOTE — ED Provider Notes (Signed)
[facility]-EMERGENCY DEPT Provider Note   CSN: [id number] Arrival date & time: 03/28/2017  0917     History   Chief Complaint Chief Complaint  Patient presents with  . Abdominal Pain; Ca Pt    HPI Christina [name] is a 56 y.o. female.  HPI   56-year-old female with abdominal pain. History is primarily from her husband and review of records. Currently she seems encephalopathic. She will look at me when I talked her smile but she is not speaking.Past history of metastatic colon cancer to the liver. She takes oxycodone at home for pain. Significant other reports that around 3 AM this morning she began complaining of increasing pain that was not controlled with her medications. He reports poor appetite but is still drinking. No vomiting. No fever. He thinks her last bowel movement was 2 days ago after using MiraLAX.  Past Medical History:  Diagnosis Date  . Endometriosis   . Hypertension   . met colon ca to liver dx'd 03/2015  . Thalassanemia     Patient Active Problem List   Diagnosis Date Noted  . Hypotension 03/23/2017  . Dehydration 03/23/2017  . SIRS (systemic inflammatory response syndrome) (HCC) 03/23/2017  . Weakness 03/23/2017  . Hypercalcemia 03/23/2017  . Goals of care, counseling/discussion 07/22/2016  . Congestive dilated cardiomyopathy (HCC) 06/17/2016  . Community acquired pneumonia of left lower lobe of lung (HCC) 06/16/2016  . Acute respiratory failure with hypoxia (HCC) 06/16/2016  . Acute on chronic combined systolic and diastolic congestive heart failure (HCC) 06/16/2016  . Port catheter in place 12/04/2015  . Chemotherapy induced neutropenia (HCC) 08/07/2015  . Rectal pain 07/02/2015  . Metastatic colon cancer to liver (HCC) 04/08/2015  . Hypertension   . Thalassanemia   . Nausea & vomiting 04/06/2015  .  Pelvic mass - 9cm - ?fibroid vs drop metastasis 04/06/2015  . Hypokalemia 04/06/2015  . Iron deficiency anemia due to chronic blood loss 04/06/2015     Past Surgical History:  Procedure Laterality Date  . COLON RESECTION N/A 04/08/2015   Procedure: LAPAROSCOPIC  RESECTION OF PART OF  SIGMOID COLON;  Surgeon: Steven Gross, MD;  Location: [facility];  Service: General;  Laterality: N/A;  . DIAGNOSTIC LAPAROSCOPY     Endometriosis  . LAPAROSCOPIC SIGMOID COLECTOMY  04/08/2015   for colorectal cancer  . LIVER BIOPSY N/A 04/08/2015   Procedure: CORE NEEDLE LIVER BIOPSY;  Surgeon: Steven Gross, MD;  Location: [facility];  Service: General;  Laterality: N/A;  . MYOMECTOMY     Gyn in [place]  . PORTACATH PLACEMENT N/A 04/08/2015   Procedure: INSERTION PORT-A-CATH;  Surgeon: Steven Gross, MD;  Location: [facility];  Service: General;  Laterality: N/A;    OB History    No data available       Home Medications    Prior to Admission medications   Medication Sig Start Date End Date Taking? Authorizing Provider  carvedilol (COREG) 6.25 MG tablet Take 6.25 mg by mouth daily.     [provider]  dexamethasone (DECADRON) 4 MG tablet Take 1 tablet (4 mg total) by mouth daily. 03/17/17   Sherrill, Gary B, MD  lidocaine-prilocaine (EMLA) cream Apply to port site one hour prior to use. Do not rub in. Cover with plastic. 12/16/16   Sherrill, Gary B, MD  magic mouthwash SOLN Equal parts Nystatin, Diphenhydramine and Maalax four times a day as needed, 5-10 ml swish and spit Patient not taking: Reported on 03/23/2017 10/28/16   Sherrill, Gary B, MD    ondansetron (ZOFRAN) 4 MG tablet Take 1 tablet (4 mg total) by mouth every 8 (eight) hours as needed for nausea or vomiting. 02/11/17   Ladell Pier, MD  Oxycodone HCl 10 MG TABS Take 1-2 tablets (10-20 mg total) by mouth every 4 (four) hours as needed. 03/27/17   Ladell Pier, MD  prochlorperazine (COMPAZINE) 10 MG tablet Take 1 tablet (10 mg total) by mouth every 6 (six) hours as needed for nausea or vomiting. Patient not taking: Reported on 03/23/2017 10/11/16   Ladell Pier, MD    Family  History Family History  Problem Relation Age of Onset  . Anesthesia problems Cousin 83       maternal cousin, colon cancer   . Clotting disorder Maternal Grandmother     Social History Social History  Substance Use Topics  . Smoking status: Never Smoker  . Smokeless tobacco: Never Used  . Alcohol use Yes     Comment: Once every four months.      Allergies   Lactose intolerance (gi); Morphine and related; Dilaudid [hydromorphone hcl]; Lasix [furosemide]; and Lorazepam   Review of Systems Review of Systems  Level 5 caveat because of encephalopathy.   Physical Exam Updated Vital Signs BP 101/76 (BP Location: Right Arm)   Pulse (!) 117   Temp 98 F (36.7 C) (Axillary)   Resp 16   SpO2 97%   Physical Exam  Constitutional: She appears well-developed and well-nourished. No distress.  HENT:  Head: Normocephalic and atraumatic.  Eyes: Conjunctivae are normal. Right eye exhibits no discharge. Left eye exhibits no discharge.  Neck: Neck supple.  Cardiovascular: Regular rhythm and normal heart sounds.  Exam reveals no gallop and no friction rub.   No murmur heard. tachycardic  Pulmonary/Chest: Effort normal and breath sounds normal. No respiratory distress.  Abdominal: Soft. She exhibits distension. There is tenderness.  Distended. Taunt. +BS. Tender in epigastrium and RUQ. Nodules RUQ abdominal wall. Husband reports from sites of prior biopsies.   Musculoskeletal: She exhibits no edema or tenderness.  Neurological:  Laying in bed with eyes open. Will make eye contact when I speak to her and will smile, but is not verbally responding to questions.   Skin: Skin is warm and dry.  Nursing note and vitals reviewed.    ED Treatments / Results  Labs (all labs ordered are listed, but only abnormal results are displayed) Labs Reviewed  CBC WITH DIFFERENTIAL/PLATELET - Abnormal; Notable for the following:       Result Value   WBC 25.5 (*)    Hemoglobin 11.3 (*)    MCH 24.5  (*)    RDW 23.7 (*)    Platelets 476 (*)    Neutro Abs 22.9 (*)    Monocytes Absolute 1.3 (*)    All other components within normal limits  COMPREHENSIVE METABOLIC PANEL - Abnormal; Notable for the following:    Sodium 146 (*)    Potassium 6.5 (*)    Chloride 115 (*)    CO2 15 (*)    BUN 87 (*)    Creatinine, Ser 2.42 (*)    Albumin 2.2 (*)    AST 353 (*)    ALT 95 (*)    Alkaline Phosphatase 482 (*)    Total Bilirubin 4.6 (*)    GFR calc non Af Amer 21 (*)    GFR calc Af Amer 25 (*)    Anion gap 16 (*)    All other components within normal limits  URINALYSIS, ROUTINE W REFLEX MICROSCOPIC - Abnormal; Notable for the following:    Color, Urine BROWN (*)    APPearance HAZY (*)    Specific Gravity, Urine >1.030 (*)    Hgb urine dipstick LARGE (*)    Bilirubin Urine MODERATE (*)    Ketones, ur TRACE (*)    Protein, ur 100 (*)    Nitrite POSITIVE (*)    Leukocytes, UA TRACE (*)    All other components within normal limits  AMMONIA - Abnormal; Notable for the following:    Ammonia 56 (*)    All other components within normal limits  URINALYSIS, MICROSCOPIC (REFLEX) - Abnormal; Notable for the following:    Bacteria, UA FEW (*)    Squamous Epithelial / LPF 0-5 (*)    All other components within normal limits  LACTIC ACID, PLASMA - Abnormal; Notable for the following:    Lactic Acid, Venous 3.8 (*)    All other components within normal limits  LACTIC ACID, PLASMA - Abnormal; Notable for the following:    Lactic Acid, Venous 3.7 (*)    All other components within normal limits  PROTIME-INR - Abnormal; Notable for the following:    Prothrombin Time 38.2 (*)    All other components within normal limits  APTT - Abnormal; Notable for the following:    aPTT 42 (*)    All other components within normal limits  BASIC METABOLIC PANEL - Abnormal; Notable for the following:    Potassium 5.3 (*)    Chloride 117 (*)    CO2 15 (*)    Glucose, Bld 138 (*)    BUN 83 (*)     Creatinine, Ser 2.17 (*)    Calcium 8.3 (*)    GFR calc non Af Amer 24 (*)    GFR calc Af Amer 28 (*)    All other components within normal limits  COMPREHENSIVE METABOLIC PANEL - Abnormal; Notable for the following:    Potassium 5.5 (*)    Chloride 114 (*)    CO2 15 (*)    Glucose, Bld 160 (*)    BUN 82 (*)    Creatinine, Ser 2.27 (*)    Calcium 8.8 (*)    Total Protein 6.0 (*)    Albumin 1.9 (*)    AST 406 (*)    ALT 105 (*)    Alkaline Phosphatase 428 (*)    Total Bilirubin 4.2 (*)    GFR calc non Af Amer 23 (*)    GFR calc Af Amer 27 (*)    All other components within normal limits  CBC - Abnormal; Notable for the following:    WBC 27.8 (*)    Hemoglobin 10.9 (*)    HCT 34.6 (*)    MCH 25.1 (*)    RDW 23.6 (*)    All other components within normal limits  PROTIME-INR - Abnormal; Notable for the following:    Prothrombin Time 24.4 (*)    All other components within normal limits  AMMONIA - Abnormal; Notable for the following:    Ammonia 66 (*)    All other components within normal limits  COMPREHENSIVE METABOLIC PANEL - Abnormal; Notable for the following:    Potassium 5.5 (*)    Chloride 116 (*)    CO2 14 (*)    Glucose, Bld 150 (*)    BUN 94 (*)    Creatinine, Ser 2.68 (*)    Total Protein 6.0 (*)    Albumin 1.9 (*)  AST 479 (*)    ALT 130 (*)    Alkaline Phosphatase 508 (*)    Total Bilirubin 5.2 (*)    GFR calc non Af Amer 19 (*)    GFR calc Af Amer 22 (*)    All other components within normal limits  CBC - Abnormal; Notable for the following:    WBC 29.0 (*)    Hemoglobin 10.9 (*)    HCT 35.0 (*)    MCH 24.6 (*)    RDW 23.9 (*)    Platelets 450 (*)    All other components within normal limits  GLUCOSE, CAPILLARY - Abnormal; Notable for the following:    Glucose-Capillary 101 (*)    All other components within normal limits  COMPREHENSIVE METABOLIC PANEL - Abnormal; Notable for the following:    Potassium 5.5 (*)    Chloride 114 (*)    CO2 13  (*)    Glucose, Bld 138 (*)    BUN 96 (*)    Creatinine, Ser 2.83 (*)    Calcium 8.7 (*)    Total Protein 5.9 (*)    Albumin 1.8 (*)    AST 456 (*)    ALT 130 (*)    Alkaline Phosphatase 522 (*)    Total Bilirubin 5.2 (*)    GFR calc non Af Amer 18 (*)    GFR calc Af Amer 20 (*)    Anion gap 16 (*)    All other components within normal limits  CBC - Abnormal; Notable for the following:    WBC 28.2 (*)    Hemoglobin 10.7 (*)    HCT 34.1 (*)    MCH 25.2 (*)    RDW 24.1 (*)    All other components within normal limits  URINE CULTURE  CULTURE, BLOOD (ROUTINE X 2)  CULTURE, BLOOD (ROUTINE X 2)  MRSA PCR SCREENING  VANCOMYCIN, RANDOM    EKG  EKG Interpretation  Date/Time:  Tuesday April 05 2017 11:46:08 EDT Ventricular Rate:  119 PR Interval:    QRS Duration: 85 QT Interval:  305 QTC Calculation: 430 R Axis:   9 Text Interpretation:  Sinus tachycardia Probable anterior infarct, old Nonspecific T abnormalities, lateral leads No significant change since 03/23/2017 Confirmed by DeLo, Douglas ([id number]) on 04/06/2017 7:56:11 PM       Radiology No results found.  Procedures Procedures (including critical care time)  Medications Ordered in ED Medications  0.9 %  sodium chloride infusion (not administered)     Initial Impression / Assessment and Plan / ED Course  I have reviewed the triage vital signs and the nursing notes.  Pertinent labs & imaging results that were available during my care of the patient were reviewed by me and considered in my medical decision making (see chart for details).     55yF with increasing abdominal pain and also encephalopathic.  Pain probably from CA? Per husband, the impression he got was that similar to pain she has been having although more intense.   HR elevated. Will check rectal temp. Husband reports poor PO intake. May be dry? Check ammonia. Hypercalcemia on recent admit. Can cause pain and confusion. Check metabolic panel. IVF  for now. Progression of CA on previous imaging. Imaging deferred for time being.   Final Clinical Impressions(s) / ED Diagnoses   Final diagnoses:  Metastatic colon cancer to liver (HCC)  Anemia, unspecified type    New Prescriptions New Prescriptions   No medications on file       Kohut, Stephen, MD 04/18/17 1140  

## 2017-04-05 NOTE — ED Notes (Signed)
Family requesting numbing cream for port accessing. Informed family that we dont have any in ED. Placed glove with ice on port and informed would let sit for few minutes.

## 2017-04-05 NOTE — Progress Notes (Signed)
**  Preliminary report by tech**  Bilateral lower extremity venous duplex completed. There is no obvious evidence of deep or superficial vein thrombosis involving the right and left lower extremities. All clearly visualized vessels appear patent and compressible. There is no evidence of Baker's cysts bilaterally. Results were given to Dr. Wilson Singer.  03/30/2017 4:07 PM Christina [name] RVT

## 2017-04-05 NOTE — ED Triage Notes (Signed)
Per EMS, pt complains of severe abdominal pain since 3AM. Pt took oxycodone, which she states did not help. Pt states she had nausea, denies emesis or diarrhea. Pt has hx of colon and liver cancer.  BP 110/80 HR 120 RR 18

## 2017-04-05 NOTE — Progress Notes (Signed)
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 She was discharged on 03/27/2017 after an admission with increased pain, renal failure/dehydration, and hypercalcemia. She did not return for a scheduled office visit on 03/31/2017.  She presents emergent room today with increased abdominal pain, leg edema, and failure to thrive. Her husband reports she has increased abdominal pain. The pain has been relieved with oxycodone. She has developed bilateral leg edema. He reports family members have been lifting her to the restroom.     Objective: Vital signs in last 24 hours: Blood pressure (!) 124/91, pulse (!) 123, temperature 100 F (37.8 C), temperature source Rectal, resp. rate 14, SpO2 100 %.  Intake/Output from previous day: No intake/output data recorded.  Physical Exam:  HEENT:  no thrush, the tongue is dry Lungs:  clear anteriorly, no respiratory distress Cardiac:  tachycardia, regular rate and rhythm Abdomen:  marked hepatomegaly extending to the left abdomen and umbilicus Extremities:  pitting edema at the lower leg bilaterally neurologic: Lethargic, arousable, not speaking, follows a few simple commands  Portacath/PICC-without erythema  Lab Results:  Recent Labs  04/02/2017 1311  WBC 25.5*  HGB 11.3*  HCT 36.3  PLT 476*    BMET  Recent Labs  03/31/2017 1311  NA 146*  K 6.5*  CL 115*  CO2 15*  GLUCOSE 94  BUN 87*  CREATININE 2.42*  CALCIUM 9.3    Lab Results  Component Value Date   CEA1 964.24 (H) 03/17/2017     Medications: I have reviewed the patient's current medications.  Assessment/Plan: 1. Metastatic colon cancer-most recently treated with FOLFIRI/Avastin 03/03/2017 2. History of congestive heart failure 3. Anemia secondary to chemotherapy, chronic disease, and thalassemia trait, status post a red cell transfusion 03/25/2017 4. Pain secondary to metastatic colon cancer involving the  liver 5. Admission 03/23/2017 with hypotension/tachycardia-secondary to dehydration 6. Dehydration/renal failure 7. History of an elevated corrected calcium level  8. 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 Her clinical status has declined markedly over the past several weeks. She appears dehydrated. She also likely has hepatic encephalopathy. I discussed the current situation with [name] and her husband. It is unclear whether she was able to understand our conversation. I recommend hospice/comfort care. I told her husband that I will predict her lifespan will be measured in days to a week. We discussed [employer].  We discussed CPR and ACLS issues. 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 I recommend comfort/supportive care and a no CODE BLUE. I will discuss this further when she is more alert.  Recommendations: 1. Admit for supportive care measures to include intravenous hydration and narcotic analgesics 2. [facility] hospice referral 3. Continue discussions regarding prognosis and CODE STATUS with her husband and [name] when she is more alert.  I will check on her in the a.m. 04/06/2017. Please call Oncology as needed.         LOS: 0 days   [name], MD   04/08/2017, 2:13 PM

## 2017-04-05 NOTE — H&P (Addendum)
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 [name].  History provided by husband as patient is unable to reply due to confusion/illness/encephalopathy.  She last received chemotherapy in July and was hospitalized from 8/15 through 8/19 with dehydration, AKI, weakness, and hypercalcemia.  She was hydrated and treated for her hypercalcemia and discharged home.  Since returning home, she has been in a lot of pain despite taking oxycodone every four hours.  She has become so weak that her husband has been lifting her, putting her on a rolling chair and rolling her between the bathroom and the bedroom.  Tolerating only fluids, not much solid food.  She has not had recent fevers, chills, vomiting, diarrhea, cough, shortness of breath.  Her leg swelling has gone down some.  She is voiding about the same volume, but color has been darker the last few days.  Last BM was a couple of days ago.  She took pain medicine around 6:40 AM today and was still in pain and weak so husband called EMS and she was brought to the ER.    ED Course: Vital signs T 100F, pulse 123, R 14, BP 101/76.  Labs:  Grossly abnormal labs with AKI, hyperkalemia of 6.5 (ECG without peaked T-waves), worsening transaminitis/elevated bilirubin, WBC 25.5.  UA positive for hemoglobin, positive nitrites, few bacteremia, 0-5WBC.  Given 1 L NS bolus, ceftriaxone for possible UTI, D50 + 10 of insulin, morphine.    Review of Systems: limited by patient's inability to speak much and encephalopathy.  Endorses abdominal  pain, but not difficulty breathing or nausea.    Past Medical History:  Diagnosis Date  . Endometriosis   . Hypertension   . met colon ca to liver dx'd 03/2015  . Thalassanemia     Past Surgical History:  Procedure Laterality Date  . COLON RESECTION N/A 04/08/2015   Procedure: LAPAROSCOPIC  RESECTION OF PART OF  SIGMOID COLON;  Surgeon: Michael Boston, MD;  Location: [facility];  Service: General;  Laterality: N/A;  . DIAGNOSTIC LAPAROSCOPY     Endometriosis  . LAPAROSCOPIC SIGMOID COLECTOMY  04/08/2015   for colorectal cancer  . LIVER BIOPSY N/A 04/08/2015   Procedure: CORE NEEDLE LIVER BIOPSY;  Surgeon: Michael Boston, MD;  Location: [facility];  Service: General;  Laterality: N/A;  . MYOMECTOMY     Gyn in [place]  . PORTACATH PLACEMENT N/A 04/08/2015   Procedure: INSERTION PORT-A-CATH;  Surgeon: Michael Boston, MD;  Location: [facility];  Service: General;  Laterality: N/A;     reports that she has never smoked. She has never used smokeless tobacco. She reports that she drinks alcohol. She reports that she does not use drugs.  Allergies  Allergen Reactions  . Lactose Intolerance (Gi) Other (See Comments)    Diarrhea, Bloated, Abdominal pain.  Marland Kitchen Morphine And Related Anaphylaxis    Sedation  . Dilaudid [Hydromorphone Hcl]     Severe cramping   . Lasix [Furosemide] Nausea And Vomiting    Problems with IV only  . Lorazepam Other (See Comments)    Pt couldn't walk straight, put her in a daze  Family History  Problem Relation Age of Onset  . Anesthesia problems Cousin 62       maternal cousin, colon cancer   . Clotting disorder Maternal Grandmother     Prior to Admission medications   Medication Sig Start Date End Date Taking? Authorizing Provider  carvedilol (COREG) 6.25 MG tablet Take 3.125 mg by mouth daily.    Yes [provider]  dexamethasone (DECADRON) 4 MG tablet Take 1 tablet (4 mg total) by mouth daily. 03/17/17  Yes Ladell Pier, MD  lidocaine-prilocaine (EMLA)  cream Apply to port site one hour prior to use. Do not rub in. Cover with plastic. 12/16/16  Yes Ladell Pier, MD  ondansetron (ZOFRAN) 4 MG tablet Take 1 tablet (4 mg total) by mouth every 8 (eight) hours as needed for nausea or vomiting. 02/11/17  Yes Ladell Pier, MD  Oxycodone HCl 10 MG TABS Take 1-2 tablets (10-20 mg total) by mouth every 4 (four) hours as needed. 03/27/17  Yes Ladell Pier, MD  magic mouthwash SOLN Equal parts Nystatin, Diphenhydramine and Maalax four times a day as needed, 5-10 ml swish and spit Patient not taking: Reported on 03/23/2017 10/28/16   Ladell Pier, MD  prochlorperazine (COMPAZINE) 10 MG tablet Take 1 tablet (10 mg total) by mouth every 6 (six) hours as needed for nausea or vomiting. Patient not taking: Reported on 03/23/2017 10/11/16   Ladell Pier, MD    Physical Exam: Vitals:   03/20/2017 1300 03/29/2017 1609 04/04/2017 1700 03/22/2017 1800  BP: (!) 124/91 (!) 138/100  (!) 155/96  Pulse: (!) 123 (!) 120 (!) 111 (!) 110  Resp: 14 17  (!) 21  Temp:    97.9 F (36.6 C)  TempSrc:    Oral  SpO2: 100% 100% 100% 100%  Weight:    71.6 kg (157 lb 13.6 oz)  Height:    _0  (1.651 m)    Constitutional:  Thin female, NAD, calm, lying on stretcher, too weak to sit up, not speaking but nodding/shaking head slightly.   Eyes: PERRL, lids normal, scleral icterus  ENMT:  Dry mucous membranes with darkening of the tongue, but no obvious thrush.  Oropharynx nonerythematous, no exudates.   Neck:  No nuchal rigidity, no masses Respiratory:  No wheezes, rales, or rhonchi Cardiovascular: Regular rate and rhythm, no murmurs / rubs / gallops.  2+ radial pulses. Abdomen:  Normal active bowel sounds, massive hepatomegaly throughout the right abdomen into the pelvis with palpable nodules/masses within the liver and some fungated areas with overlying ecchymotic appearing sking in the upper right to epigastric area.  Left lower quadrant with good bowel sounds, soft,  nondistended, but still TTP without rebound or guarding.  Liver TTP also.   Musculoskeletal: decreased muscle tone and bulk.  No contractures.  2+ soft pitting edema of the bilateral lower extremities Skin:  no rashes, abrasions, or ulcers.  Mass on abdomen as above Neurologic:  Symmetric appearing facial expressions.  Moves both hands and wiggles toes on both feet, right more than left.  Unable to maintain eye contact.   Psychiatric:  Only word verbalized was "pain," once during visit.    Labs on Admission: I have personally reviewed following labs and imaging studies  CBC:  Recent Labs Lab 04/04/2017 1311  WBC 25.5*  NEUTROABS 22.9*  HGB 11.3*  HCT 36.3  MCV 78.6  PLT 858*   Basic Metabolic Panel:  Recent Labs Lab 04/01/2017 1311 03/17/2017 1809  NA  146* 145  K 6.5* 5.3*  CL 115* 117*  CO2 15* 15*  GLUCOSE 94 138*  BUN 87* 83*  CREATININE 2.42* 2.17*  CALCIUM 9.3 8.3*   GFR: Estimated Creatinine Clearance: 28.7 mL/min (A) (by C-G formula based on SCr of 2.17 mg/dL (H)). Liver Function Tests:  Recent Labs Lab 03/10/2017 1311  AST 353*  ALT 95*  ALKPHOS 482*  BILITOT 4.6*  PROT 6.6  ALBUMIN 2.2*   No results for input(s): LIPASE, AMYLASE in the last 168 hours.  Recent Labs Lab 03/10/2017 1311  AMMONIA 56*   Coagulation Profile:  Recent Labs Lab 03/16/2017 1627  INR 3.93   Cardiac Enzymes: No results for input(s): CKTOTAL, CKMB, CKMBINDEX, TROPONINI in the last 168 hours. BNP (last 3 results) No results for input(s): PROBNP in the last 8760 hours. HbA1C: No results for input(s): HGBA1C in the last 72 hours. CBG: No results for input(s): GLUCAP in the last 168 hours. Lipid Profile: No results for input(s): CHOL, HDL, LDLCALC, TRIG, CHOLHDL, LDLDIRECT in the last 72 hours. Thyroid Function Tests: No results for input(s): TSH, T4TOTAL, FREET4, T3FREE, THYROIDAB in the last 72 hours. Anemia Panel: No results for input(s): VITAMINB12, FOLATE, FERRITIN, TIBC,  IRON, RETICCTPCT in the last 72 hours. Urine analysis:    Component Value Date/Time   COLORURINE BROWN (A) 03/25/2017 1207   APPEARANCEUR HAZY (A) 03/24/2017 1207   LABSPEC >1.030 (H) 03/18/2017 1207   PHURINE 6.5 03/16/2017 1207   GLUCOSEU NEGATIVE 03/12/2017 1207   HGBUR LARGE (A) 03/25/2017 1207   BILIRUBINUR MODERATE (A) 04/04/2017 1207   KETONESUR TRACE (A) 04/03/2017 1207   PROTEINUR 100 (A) 03/23/2017 1207   NITRITE POSITIVE (A) 03/26/2017 1207   LEUKOCYTESUR TRACE (A) 03/12/2017 1207   Sepsis Labs: _0 (procalcitonin:4,lacticidven:4) ) Recent Results (from the past 240 hour(s))  Urine culture     Status: Abnormal   Collection Time: 03/27/17  7:36 AM  Result Value Ref Range Status   Specimen Description URINE, CLEAN CATCH  Final   Special Requests NONE  Final   Culture (A)  Final    <10,000 COLONIES/mL INSIGNIFICANT GROWTH Performed at [facility], [place] [address] [place], [place] [address]    Report Status 03/28/2017 FINAL  Final     Radiological Exams on Admission: Ct Abdomen Wo Contrast  Result Date: 03/09/2017 CLINICAL DATA:  Advanced metastatic colon cancer with dehydration and likely hepatic encephalopathy. EXAM: CT ABDOMEN WITHOUT CONTRAST TECHNIQUE: Multidetector CT imaging of the abdomen was performed following the standard protocol without IV contrast. COMPARISON:  02/21/2017 FINDINGS: Assessment of solid and hollow visceral organ pathology is limited by lack of IV and oral contrast. Lower chest: There has been interval clearing of small right pleural effusion with some residual subpleural consolidations likely representing atelectasis noted at the right lung base. There is cardiomegaly without pericardial effusion. No pneumonic consolidations. The lingular and left lower lobe pulmonary nodules noted on recent comparison study are not visualized and are likely not included as part of this study. Hepatobiliary: In homogeneous attenuation of the liver  consistent with known hepatic metastasis with coarse intraparenchymal densities occupying much of the medial and portions of the lateral segment of the left hepatic lobe. No biliary dilatation is identified. Pancreas: Unremarkable. No pancreatic ductal dilatation or surrounding inflammatory changes. Spleen: Normal in size without focal abnormality. Adrenals/Urinary Tract: No adrenal masses. Renovascular calcifications noted of the kidneys, right greater than left. No hydroureteronephrosis. Stomach/Bowel: Contracted stomach.  No bowel obstruction. Vascular/Lymphatic: Right-sided metastatic  juxtacardiac lymphadenopathy is slightly larger currently estimated at 1.4 x 1.8 cm versus 1.4 x 1.6 cm previously. Bulky gastrohepatic ligament lymphadenopathy is stable estimated at 3.7 x 2.5 cm versus 3.9 x 2.5 cm previously. Minimal atherosclerosis of the abdominal aorta without aneurysm. Other: Interval slight increase in small to moderate volume of ascites. Interval increase in soft tissue metastatic masses along the anterior abdominal wall, the largest is approximately 6.1 x 4.4 cm versus 5.1 x 4.8 cm. Metastatic peritoneal implants are again noted Musculoskeletal: No aggressive lytic or blastic osseous lesions. No acute fracture. IMPRESSION: 1. Interval clearing of right small pleural effusion with residual atelectasis seen at the right lung base. Previously noted lingular and left lower lobe nodules are not visualized, likely excluded on this study. 2. Some interval increase in size of of juxtacardiac and gastrohepatic lymphadenopathy as well as metastatic subcutaneous anterior abdominal wall masses. 3. Some interval increase in ascites more so in the right hemiabdomen. 4. Hepatomegaly with ill-defined hypodense and hyperdense metastatic lesions scattered throughout the liver, less well characterized due to lack of IV contrast. Electronically Signed   By: Ashley Royalty M.D.   On: 03/10/2017 16:03   Ct Head Wo  Contrast  Result Date: 04/06/2017 CLINICAL DATA:  Altered mental status. History of abdominal cancer. Patient unable to communicate today. EXAM: CT HEAD WITHOUT CONTRAST TECHNIQUE: Contiguous axial images were obtained from the base of the skull through the vertex without intravenous contrast. COMPARISON:  None. FINDINGS: Brain: No evidence of acute infarction, hemorrhage, hydrocephalus, extra-axial collection or mass lesion/mass effect. There are vague areas of white matter hypoattenuation most likely due to mild chronic microvascular ischemic change. Vascular: No hyperdense vessel or unexpected calcification. Skull: Normal. Negative for fracture or focal lesion. Sinuses/Orbits: Globes and orbits are unremarkable. Visualized sinuses and mastoid air cells are clear. Other: None. IMPRESSION: 1. No acute intracranial abnormalities. Electronically Signed   By: Lajean Manes M.D.   On: 03/15/2017 17:26    EKG: Independently reviewed.  Sinus tachycardia   Assessment/Plan Principal Problem:   SIRS (systemic inflammatory response syndrome) (HCC) Active Problems:   Hypertension   Metastatic colon cancer to liver (HCC)   Dehydration   Weakness   Sepsis (HCC)   Acute metabolic encephalopathy   Hyperkalemia   Acute kidney injury ([place])   Transaminitis   Elevated bilirubin   Lower extremity edema   Sepsis (tachycardia, leukocytosis, low grade fever) possibly due to UTI vs. Intraabdominal source.  Could also be SIRS from dehydration in the setting of metastatic cancer.  Lungs sound clear and bases clear on CT abd/pelvis.  -  Lactic acid 3.8 (may not be accurate in setting of liver disease).  Will not continue to bolus.  Tachycardia also a normal response at end-of-life.  Do not want to cause respiratory distress with excessive IVF in setting of heart failure.   -  Additional NS bolus and IVF -  Monitor for respiratory distress given systolic heart failure -  Vancomycin and zosyn for undefined sepsis  pending further investigation -  CT abd/pelvis:  No biliary ductal dilation or evidence of bowel obstruction/diverticulitis -  Repeat lactic acid in a few hours -  F/u blood cultures -  F/u urine culture  Metabolic encephalopathy, may be due to sepsis, dehydration, hepatic encephalopathy -  CT head:  No acute problems or obvious mets but done without contrast -  Ammonia level 56 -  SLP evaluation for swallow and speech -  Aspiration precautions -  Consider lactulose  enemas depending on results of Dr. Gearldine Shown conversation tomorrow  Hyperkalemia due to AKI and possibly tissue necrosis from large mets -  Agree with d50/insulin -  IVF  -  Sodium bicarb amps given -  Repeat BMP in a few hours, improved -  no kayexalate until bowels investigated  AKI with metabolic acidosis, likely due to dehydration -  RUS a week ago showed no hydronephrosis -  CT today, kidneys do not appear severely distended -  IVF -  Repeat BMP in AM  Liver failure with rising transaminitis, elevated bilirubin, rising INR.  Likely due to dehydration plus progressive malignancy.  Doubt cholecystitis -  Bilirubin is now up to 4.6 alk phos 482 -  Check INR -  Platelets are still normal  At risk for adrenal insufficiency.  On dexamethasone 47m daily at home -  Increase to dexamethasone 41mIV BID and start stress dose if she decompensates  Generalized weakness -  PT/OT -  Falls precautions  Metastatic colon cancer, progressive with increasing pain -  Appreciate Dr. ShGearldine Shownssistance -  Husband prefers full code -  Morphine prn pain  Hypertension, -  Hold medications  Systolic congestive heart failure, EF 35-40% -  Daily weights -  Strict I/O -  Monitor for dyspnea  Lower extremity edema likely due to heart failure/anasarca and probably has some IVC compression from hepatomegaly -  Duplex lower extremity:  Negative for DVT  DVT prophylaxis: lovenox  Code Status: full code, although recommended  DNR.  Dr. ShBenay Spiceill readdress again in the morning.   Family Communication:  Patient and husband  Disposition Plan: to be determined. Patient's prognosis is poor (days to weeks)  Consults called: Oncology, Dr. [place]Benay Spice [place]. Admission status: inpatient, stepdown, at high risk of decompensation from multi-system failure in setting of incurable metastatic colon cancer.   End-stage of life/imminent death.  Anticipate in-hospital death.    SHJanece CanterburyD [facility] Pager [phone] 7PM-7AM, please contact night-coverage [email or web address] Password [email or web address]/28/2018, 7:12 PM

## 2017-04-05 NOTE — ED Notes (Signed)
Bed: [facility] Expected date:  Expected time:  Means of arrival:  Comments: 

## 2017-04-05 NOTE — Telephone Encounter (Signed)
Call placed to pt's husband to confirm that I had received his message regarding his wife's recent admission. Informed pt that MD [name] has been made aware. Pt's husband verbalizes understanding and is appreciative of call back.

## 2017-04-05 NOTE — Progress Notes (Signed)
Pharmacy Antibiotic Note  Christina [name] is a 57 y.o. female admitted on 03/09/2017 with sepsis.  Pharmacy has been consulted for vancomycin and zosyn dosing.  Past history of metastatic colon cancer to the liver, HFrEF, HTN, iron deficiency .  Appears dehydrated per Dr [name].  Wt 66.1 kg, WBC 25.5, Creat 2.42, Temp 100, HR 123, RR 14. Rocephin 1 gm given in ED at 1328.   Plan: vancomycin 1 gm loading dose Vancomycin 750 mg IV every 24 hours.  Goal trough 15-20 mcg/mL.  Zosyn 3.375 gm IV x 1 dose over 30 minutes then zosyn 3.375 gm IV q8h, infuse each dose over 4 hours F/u renal function, WBC, temp, culture data vanc levels as needed   Temp (24hrs), Avg:99 F (37.2 C), Min:98 F (36.7 C), Max:100 F (37.8 C)   Recent Labs Lab 04/08/2017 1311  WBC 25.5*  CREATININE 2.42*    Estimated Creatinine Clearance: 23.8 mL/min (A) (by C-G formula based on SCr of 2.42 mg/dL (H)).    Allergies  Allergen Reactions  . Lactose Intolerance (Gi) Other (See Comments)    Diarrhea, Bloated, Abdominal pain.  Marland Kitchen Morphine And Related Anaphylaxis    Sedation  . Dilaudid [Hydromorphone Hcl]     Severe cramping   . Lasix [Furosemide] Nausea And Vomiting    Problems with IV only  . Lorazepam Other (See Comments)    Pt couldn't walk straight, put her in a daze   Antimicrobials this admission:  8/28 CTX x1 8/28 Vanc>> 8/28 zosyn>> Dose adjustments this admission:   Microbiology results:  8/28 Ucx>>  Eudelia Bunch, Pharm.D. [phone] 04/02/2017 3:18 PM

## 2017-04-06 DIAGNOSIS — Z7189 Other specified counseling: Secondary | ICD-10-CM

## 2017-04-06 DIAGNOSIS — R6510 Systemic inflammatory response syndrome (SIRS) of non-infectious origin without acute organ dysfunction: Secondary | ICD-10-CM

## 2017-04-06 DIAGNOSIS — R4182 Altered mental status, unspecified: Secondary | ICD-10-CM

## 2017-04-06 LAB — COMPREHENSIVE METABOLIC PANEL
ALBUMIN: 1.9 g/dL — AB (ref 3.5–5.0)
ALT: 105 U/L — ABNORMAL HIGH (ref 14–54)
AST: 406 U/L — AB (ref 15–41)
Alkaline Phosphatase: 428 U/L — ABNORMAL HIGH (ref 38–126)
Anion gap: 15 (ref 5–15)
BILIRUBIN TOTAL: 4.2 mg/dL — AB (ref 0.3–1.2)
BUN: 82 mg/dL — AB (ref 6–20)
CHLORIDE: 114 mmol/L — AB (ref 101–111)
CO2: 15 mmol/L — AB (ref 22–32)
Calcium: 8.8 mg/dL — ABNORMAL LOW (ref 8.9–10.3)
Creatinine, Ser: 2.27 mg/dL — ABNORMAL HIGH (ref 0.44–1.00)
GFR calc Af Amer: 27 mL/min — ABNORMAL LOW (ref >60)
GFR calc non Af Amer: 23 mL/min — ABNORMAL LOW (ref >60)
GLUCOSE: 160 mg/dL — AB (ref 65–99)
POTASSIUM: 5.5 mmol/L — AB (ref 3.5–5.1)
SODIUM: 144 mmol/L (ref 135–145)
Total Protein: 6 g/dL — ABNORMAL LOW (ref 6.5–8.1)

## 2017-04-06 LAB — PROTIME-INR
INR: 2.21
Prothrombin Time: 24.4 seconds — ABNORMAL HIGH (ref 11.4–15.2)

## 2017-04-06 LAB — CBC
HEMATOCRIT: 34.6 % — AB (ref 36.0–46.0)
Hemoglobin: 10.9 g/dL — ABNORMAL LOW (ref 12.0–15.0)
MCH: 25.1 pg — AB (ref 26.0–34.0)
MCHC: 31.5 g/dL (ref 30.0–36.0)
MCV: 79.5 fL (ref 78.0–100.0)
Platelets: 392 10*3/uL (ref 150–400)
RBC: 4.35 MIL/uL (ref 3.87–5.11)
RDW: 23.6 % — AB (ref 11.5–15.5)
WBC: 27.8 10*3/uL — ABNORMAL HIGH (ref 4.0–10.5)

## 2017-04-06 LAB — MRSA PCR SCREENING: MRSA by PCR: NEGATIVE

## 2017-04-06 LAB — URINE CULTURE: Culture: NO GROWTH

## 2017-04-06 LAB — AMMONIA: Ammonia: 66 umol/L — ABNORMAL HIGH (ref 9–35)

## 2017-04-06 MED ORDER — CHLORHEXIDINE GLUCONATE CLOTH 2 % EX PADS
6.0000 | MEDICATED_PAD | Freq: Every day | CUTANEOUS | Status: DC
  Administered 2017-04-06 – 2017-04-14 (×5): 6 via TOPICAL

## 2017-04-06 MED ORDER — FENTANYL CITRATE (PF) 100 MCG/2ML IJ SOLN
12.5000 ug | INTRAMUSCULAR | Status: DC | PRN
  Administered 2017-04-06 (×4): 12.5 ug via INTRAVENOUS
  Administered 2017-04-07 (×3): 25 ug via INTRAVENOUS
  Administered 2017-04-07: 12.5 ug via INTRAVENOUS
  Administered 2017-04-07 – 2017-04-09 (×3): 25 ug via INTRAVENOUS
  Filled 2017-04-06 (×11): qty 2

## 2017-04-06 MED ORDER — SODIUM CHLORIDE 0.9% FLUSH: 10.0000 mL | INTRAVENOUS | Status: AC | PRN

## 2017-04-06 MED ORDER — HEPARIN SODIUM (PORCINE) 5000 UNIT/ML IJ SOLN
5000.0000 [IU] | Freq: Three times a day (TID) | INTRAMUSCULAR | Status: DC
  Administered 2017-04-06 – 2017-04-07 (×3): 5000 [IU] via SUBCUTANEOUS
  Filled 2017-04-06 (×5): qty 1

## 2017-04-06 MED ORDER — SODIUM CHLORIDE 0.9% FLUSH
10.0000 mL | Freq: Two times a day (BID) | INTRAVENOUS | Status: DC
  Administered 2017-04-06 – 2017-04-12 (×8): 10 mL
  Administered 2017-04-13: 20 mL
  Administered 2017-04-13: 10 mL
  Administered 2017-04-14: 20 mL

## 2017-04-06 MED ORDER — LACTULOSE 10 GM/15ML PO SOLN
20.0000 g | Freq: Three times a day (TID) | ORAL | Status: DC
  Administered 2017-04-06: 20 g via ORAL
  Filled 2017-04-06: qty 30

## 2017-04-06 MED ORDER — SODIUM CHLORIDE 0.9 % IV SOLN: 250.0000 mL | Freq: Once | INTRAVENOUS | Status: DC

## 2017-04-06 MED ORDER — SODIUM CHLORIDE 0.9% FLUSH: 10.0000 mL | INTRAVENOUS | Status: DC | PRN

## 2017-04-06 MED ORDER — HEPARIN SOD (PORK) LOCK FLUSH 100 UNIT/ML IV SOLN
500.0000 [IU] | Freq: Every day | INTRAVENOUS | Status: AC | PRN
  Filled 2017-04-06: qty 5

## 2017-04-06 NOTE — Progress Notes (Signed)
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 Her husband asked me to not discuss CODE STATUS.     Objective: Vital signs in last 24 hours: Blood pressure 117/87, pulse (!) 107, temperature (!) 96.5 F (35.8 C), temperature source Axillary, resp. rate 14, height 5\' 5"  (1.651 m), weight 174 lb 9.7 oz (79.2 kg), SpO2 100 %.  Intake/Output from previous day: 08/28 0701 - 08/29 0700 In: 1575 [I.V.:1275; IV Piggyback:300] Out: 230 [Urine:230]  Physical Exam:  HEENT:  no thrush Lungs:  clear anteriorly, no respiratory distress Cardiac: Regular rate and rhythm Abdomen:  Distended with marked hepatomegaly Extremities:  pitting edema at the lower leg bilaterally neurologic: Lethargic, opens eyes, not speaking, not following most commands. Open her mouth when prompted by her sister  Portacath/PICC-without erythema  Lab Results:  Recent Labs  04/08/2017 1311 04/06/17 0341  WBC 25.5* 27.8*  HGB 11.3* 10.9*  HCT 36.3 34.6*  PLT 476* 392    BMET  Recent Labs  03/10/2017 1809 04/06/17 0341  NA 145 144  K 5.3* 5.5*  CL 117* 114*  CO2 15* 15*  GLUCOSE 138* 160*  BUN 83* 82*  CREATININE 2.17* 2.27*  CALCIUM 8.3* 8.8*    Lab Results  Component Value Date   CEA1 964.24 (H) 03/17/2017     Medications: I have reviewed the patient's current medications.  Assessment/Plan: 1. Metastatic colon cancer-most recently treated with FOLFIRI/Avastin 03/03/2017 2. History of congestive heart failure 3. Anemia secondary to chemotherapy, chronic disease, and thalassemia trait, status post a red cell transfusion 03/25/2017 4. Pain secondary to metastatic colon cancer involving the liver 5. Admission 03/23/2017 with hypotension/tachycardia-secondary to dehydration 6. Dehydration/renal failure 7. History of an elevated corrected calcium level  8. Elevated ammonia  9. 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 She has multiorgan failure in the setting of advanced metastatic colon cancer. I discussed the situation with her husband and sister. I recommend comfort care. We discussed home hospice and [facility]. Her lifespan will most likely be measured in days. I discussed CPR and ACLS issues. They would like her to remain on a full CODE STATUS for now. 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   Recommendations: 1. Comfort care measures 2. [facility] hospice referral 3. Consider transfer to the third floor Oncology unit. 4. Continue discussions with family regarding CODE STATUS.         LOS: 1 day   [name], MD   04/06/2017, 8:45 AM

## 2017-04-06 NOTE — Progress Notes (Signed)
SLP Cancellation Note  Patient Details Name: Christina [name] MRN: [id number] DOB: 1960-08-29   Cancelled treatment:        RN states that Hospice services are currently in pt's room. She reported pt is not/has not been alert enough for po's. Will continue efforts if/when pt appropriate versus MD discontinuing orders if felt she may not become alert enough.   Houston Siren 04/06/2017, 10:00 AM    Orbie Pyo Colvin Caroli.Ed [employer] [phone]

## 2017-04-06 NOTE — Consult Note (Signed)
Consultation Note Date: 04/06/2017   Patient Name: Christina [name]  DOB: Jun 23, 1961  MRN: [id number]  Age / Sex: 56 y.o., female  PCP: Benito Mccreedy, MD Referring Physician: Reyne Dumas, MD  Reason for Consultation: Terminal Care  HPI/Patient Profile: is a 56 y.o. female with medical history significant of metastatic colon cancer, HFrEF, HTN, iron deficiency anemia/beta thal.   Clinical Assessment and Goals of Care: Lashea Goda appears lethargic. She complains of pain. She has been lethargic.   Her husband is present and states Ieesha made it very clear to him that she wants to remain a full code. He states he has been asked about this several times and wants to take things step by step, and will make decisions as he comes to them, and as things change. Her husband does state he wants her pain to be controlled, but does not want her to be sleeping constantly due to the medication.   SUMMARY OF RECOMMENDATIONS   Fentanyl dose adjusted to 12.65mg to 25 mcg.  RN  encouraged to use medication for pain.  Code Status/Advance Care Planning:  Full code   Symptom Management:   Fentanyl adjusted to 12.5 mcg for moderate pain and 285m for severe pain (family has been reluctant to allow further medications as they believe that this is causing her sleepiness).  We therefore decreased dosing to see if she appears more comfortable and is able to be more interactive.  Additional Recommendations (Limitations, Scope, Preferences):  Continue aggressive treatment at this time.   Psycho-social/Spiritual:  Husband states he and Ms. Bartek are of [place] and he believes God can intervene.    Prognosis:   < 6 weeks   Discharge Planning: To Be Determined      Primary Diagnoses: Present on Admission: . Sepsis ([place]. Dehydration . Hypertension . Metastatic colon cancer to liver  ([facility]. SIRS (systemic inflammatory response syndrome) (HCC)   I have reviewed the medical record, interviewed the patient and family, and examined the patient. The following aspects are pertinent.  Past Medical History:  Diagnosis Date  . Endometriosis   . Hypertension   . met colon ca to liver dx'd 03/2015  . Thalassanemia    Social History   Social History  . Marital status: Married    Spouse name: N/A  . Number of children: N/A  . Years of education: N/A   Occupational History  . Internal Medicine doctor     Works in [employer]  . Smoking status: Never Smoker  . Smokeless tobacco: Never Used  . Alcohol use Yes     Comment: Once every four months.   . Drug use: No  . Sexual activity: Not Asked   Other Topics Concern  . None   Social History Narrative   Married, husband GeKelli Churnmarried X 2 years)   No children   IM Physician in [place] Family History  Problem Relation Age of Onset  . Anesthesia problems Cousin [id number]  maternal cousin, colon cancer   . Clotting disorder Maternal Grandmother    Scheduled Meds: . dexamethasone  4 mg Intravenous Q12H  . heparin subcutaneous  5,000 Units Subcutaneous Q8H  . lactulose  20 g Oral TID   Continuous Infusions: . sodium chloride    . dextrose 5 % and 0.45% NaCl 100 mL/hr at 04/06/17 0800  . piperacillin-tazobactam (ZOSYN)  IV Stopped (04/06/17 0912)  . vancomycin     PRN Meds:.bisacodyl, fentaNYL (SUBLIMAZE) injection, heparin lock flush, ondansetron **OR** ondansetron (ZOFRAN) IV, sodium chloride flush Medications Prior to Admission:  Prior to Admission medications   Medication Sig Start Date End Date Taking? Authorizing Provider  carvedilol (COREG) 6.25 MG tablet Take 3.125 mg by mouth daily.    Yes [provider]  dexamethasone (DECADRON) 4 MG tablet Take 1 tablet (4 mg total) by mouth daily. 03/17/17  Yes Ladell Pier, MD  lidocaine-prilocaine (EMLA)  cream Apply to port site one hour prior to use. Do not rub in. Cover with plastic. 12/16/16  Yes Ladell Pier, MD  ondansetron (ZOFRAN) 4 MG tablet Take 1 tablet (4 mg total) by mouth every 8 (eight) hours as needed for nausea or vomiting. 02/11/17  Yes Ladell Pier, MD  Oxycodone HCl 10 MG TABS Take 1-2 tablets (10-20 mg total) by mouth every 4 (four) hours as needed. 03/27/17  Yes Ladell Pier, MD  magic mouthwash SOLN Equal parts Nystatin, Diphenhydramine and Maalax four times a day as needed, 5-10 ml swish and spit Patient not taking: Reported on 03/23/2017 10/28/16   Ladell Pier, MD  prochlorperazine (COMPAZINE) 10 MG tablet Take 1 tablet (10 mg total) by mouth every 6 (six) hours as needed for nausea or vomiting. Patient not taking: Reported on 03/23/2017 10/11/16   Ladell Pier, MD   Allergies  Allergen Reactions  . Lactose Intolerance (Gi) Other (See Comments)    Diarrhea, Bloated, Abdominal pain.  Marland Kitchen Morphine And Related Anaphylaxis    Sedation  . Dilaudid [Hydromorphone Hcl]     Severe cramping   . Lasix [Furosemide] Nausea And Vomiting    Problems with IV only  . Lorazepam Other (See Comments)    Pt couldn't walk straight, put her in a daze   Review of Systems  Constitutional: Positive for activity change.  Musculoskeletal:       Pain  Psychiatric/Behavioral: Positive for confusion.    Physical Exam  Constitutional: No distress.  HENT:  Head: Normocephalic and atraumatic.  Cardiovascular:  Warm and dry  Pulmonary/Chest: Effort normal.  Neurological: She is alert.  Intermittently sleepy with periods of alertness.     Vital Signs: BP 117/87   Pulse (!) 107   Temp (!) 96.5 F (35.8 C) (Axillary)   Resp 14   Ht _0  (1.651 m)   Wt 79.2 kg (174 lb 9.7 oz)   SpO2 100%   BMI 29.06 kg/m  Pain Assessment: Faces POSS *See Group Information*: 2-Acceptable,Slightly drowsy, easily aroused Pain Score: Asleep   SpO2: SpO2: 100 % O2 Device:SpO2: 100 % O2  Flow Rate: .   IO: Intake/output summary:  Intake/Output Summary (Last 24 hours) at 04/06/17 1026 Last data filed at 04/06/17 0800  Gross per 24 hour  Intake             1775 ml  Output              230 ml  Net  1545 ml    LBM: Last BM Date: 04/03/17 Baseline Weight: Weight: 71.6 kg (157 lb 13.6 oz) Most recent weight: Weight: 79.2 kg (174 lb 9.7 oz)     Palliative Assessment/Data: 30%     Time In: 0845 Time Out: 09:45 Time Total:60 minutes Greater than 50%  of this time was spent counseling and coordinating care related to the above assessment and plan.  Signed by: Asencion Gowda, NP   Please contact Palliative Medicine Team phone at [phone] for questions and concerns.  For individual provider: See Amion   Attending Note:  I met with [name] and her husband in conjunction with Asencion Gowda from palliative medicine team and was present for entire encounter.  Her husband reports that she has been clear about desire for full aggressive care and FULL CODE status.  He reports that the fact that he continues to be asked about CODE STATUS is causing stress in relationship with caregiving team (she had previously stopped seeing a prior oncologist due to continued questions about this).  He is very clear that does not want to discuss CODE STAUS further at this time.  We also discussed desire to have good pain control and balance between this and sleepiness.  Plan to continue with fentanyl, but will try reduced dose to see if effective while allowing her to be more interactive.  Will continue to advance conversation as patient and family is willing to discuss.  See above for further details.  Micheline Rough, MD [facility] Team [phone]

## 2017-04-06 NOTE — Care Management Note (Signed)
Case Management Note  Patient Details  Name: Nolie Bignell MRN: [id number] Date of Birth: 1960/11/02  Subjective/Objective:                  ED Course: Vital signs T 100F, pulse 123, R 14, BP 101/76.  Labs:  Grossly abnormal labs with AKI, hyperkalemia of 6.5 (ECG without peaked T-waves), worsening transaminitis/elevated bilirubin, WBC 25.5.  UA positive for hemoglobin, positive nitrites, few bacteremia, 0-5WBC.  Given 1 L NS bolus, ceftriaxone for possible UTI, D50 + 10 of insulin, morphine  Action/Plan: Date:  April 06, 2017  Chart reviewed for concurrent status and case management needs.  Will continue to follow patient progress.  Discharge Planning: following for needs  Expected discharge date: [id number]  Velva Harman, BSN, [place], [place]   Expected Discharge Date:   (unknown)               Expected Discharge Plan:  Home/Self Care  In-House Referral:     Discharge planning Services  CM Consult  Post Acute Care Choice:    Choice offered to:     DME Arranged:    DME Agency:     HH Arranged:    [place] Agency:     Status of Service:  In process, will continue to follow  If discussed at Long Length of Stay Meetings, dates discussed:    Additional Comments:  Leeroy Cha, RN 04/06/2017, 9:28 AM

## 2017-04-06 NOTE — Evaluation (Addendum)
Clinical/Bedside Swallow Evaluation Patient Details  Name: Christina [name] MRN: [id number] Date of Birth: 1960-12-09  Today's Date: 04/06/2017 Time: SLP Start Time (ACUTE ONLY): 1025 SLP Stop Time (ACUTE ONLY): 1039 SLP Time Calculation (min) (ACUTE ONLY): 14 min  Past Medical History:  Past Medical History:  Diagnosis Date  . Endometriosis   . Hypertension   . met colon ca to liver dx'd 03/2015  . Thalassanemia    Past Surgical History:  Past Surgical History:  Procedure Laterality Date  . COLON RESECTION N/A 04/08/2015   Procedure: LAPAROSCOPIC  RESECTION OF PART OF  SIGMOID COLON;  Surgeon: Michael Boston, MD;  Location: [facility];  Service: General;  Laterality: N/A;  . DIAGNOSTIC LAPAROSCOPY     Endometriosis  . LAPAROSCOPIC SIGMOID COLECTOMY  04/08/2015   for colorectal cancer  . LIVER BIOPSY N/A 04/08/2015   Procedure: CORE NEEDLE LIVER BIOPSY;  Surgeon: Michael Boston, MD;  Location: [facility];  Service: General;  Laterality: N/A;  . MYOMECTOMY     Gyn in [place]  . 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 Per chart pt last received chemotherapy in July and was hospitalized from 8/15 through 8/19 with dehydration, AKI, weakness, and hypercalcemia. Currently pt with increased pain, oxycodone every four hours, increased weakness, tolerating only fluids, not much solid food. CXR interval clearing of right small pleural effusion with residual atelectasis seen at the right lung base. Previously noted lingular and left lower lobe nodules are not visualized, likely excluded on this study.   Assessment / Plan / Recommendation Clinical Impression  Pt drowsy but  awake, deconditioned, malnourished with head gesture response x 2, no vocalizations with indications of cognitive decline. Family limited head of bed elevation to approximately 40% due to abdominal distention and pain (demonstrated reverse Trendelenburg positioning). Pt prolongs oral tranist, intermittent holding likely in efforts to control bolus versus cognitive deficits. Possible delayed swallow initiation with thin. Consumed 2 bites of puree without s/s aspiration across textures. As overall medical status declines with her cancer, swallow function will as well. Educated swallow precautions to mitigate pt's risk. Recommend regular texture allowing family to order appropriate and/or desired foods (sister in agreement and will start with soups). Thin liquids, straws allowed, crush meds. No further ST needed at this time.    SLP Visit Diagnosis: Dysphagia, oropharyngeal phase (R13.12)   Of note, cognitive assessment ordered. Considering the stage of pt's cancer she likely/may have mets to the brain with likely and unfortunate terminal prognosis. Cognitive assessment not warranted at this time. Thank you.     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 Per chart pt last received  chemotherapy in July and was hospitalized from 8/15 through 8/19 with dehydration, AKI, weakness, and hypercalcemia. Currently pt with increased pain, oxycodone every four hours, increased weakness, tolerating only fluids, not much solid food. CXR interval clearing of right small pleural effusion with residual atelectasis seen at the right lung base. Previously noted lingular and left lower lobe nodules are not visualized, likely excluded on this study. Type of Study: Bedside Swallow Evaluation Previous Swallow Assessment:  (none) Diet Prior to this Study: NPO Temperature Spikes Noted: No Respiratory Status: Room air History of Recent Intubation: No Behavior/Cognition: Lethargic/Drowsy;Requires cueing Oral Cavity Assessment:  (difficult to fully view, xerostoma ?) Oral Care Completed by SLP: No Oral Cavity - Dentition: Adequate natural dentition Vision: Functional for self-feeding Self-Feeding Abilities: Needs assist;Needs set up Patient Positioning: Upright in bed Baseline Vocal Quality:  (no vocalizations ) Volitional Cough: Cognitively unable to elicit Volitional Swallow:  (NT)    Oral/Motor/Sensory Function Overall Oral Motor/Sensory Function: Generalized oral weakness   Ice Chips Ice chips: Not tested   Thin Liquid Thin Liquid: Impaired Presentation: Straw Oral Phase Impairments: Reduced lingual movement/coordination Oral Phase Functional Implications: Prolonged oral transit Pharyngeal  Phase Impairments: Decreased hyoid-laryngeal movement;Suspected delayed Swallow    Nectar Thick Nectar Thick Liquid: Not tested   Honey Thick Honey Thick Liquid: Not tested   Puree Puree: Impaired Presentation: Spoon Oral Phase Functional Implications: Prolonged oral transit Pharyngeal Phase Impairments: Suspected delayed Swallow   Solid   GO   Solid: Not tested        Houston Siren 04/06/2017,11:03 AM  Orbie Pyo Colvin Caroli.[employer] [phone]

## 2017-04-06 NOTE — Progress Notes (Signed)
PT Cancellation Note  Patient Details Name: Christina [name] MRN: [id number] DOB: 01/22/1961   Cancelled Treatment:     PT order received but eval deferred at request of RN pending palliative care consult.  Will follow.   Molleigh Huot 04/06/2017, 9:47 AM

## 2017-04-06 NOTE — Progress Notes (Signed)
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 [name].  History provided by husband as patient is unable to reply due to confusion/illness/encephalopathy.  She last received chemotherapy in July and was hospitalized from 8/15 through 8/19 with dehydration, AKI, weakness, and hypercalcemia.  She was hydrated and treated for her hypercalcemia and discharged home.  Since returning home, she has been in a lot of pain despite taking oxycodone every four hours. She took pain medicine around 6:40 AM today and was still in pain and weak so husband called EMS and she was brought to the ER.   presented with complaints of severe abdominal pain.  Assessment and plan Sepsis (tachycardia, leukocytosis, low grade fever) possibly due to UTI vs. Intraabdominal source.  Could also be SIRS from dehydration in the setting of metastatic cancer.  Lungs sound clear and bases clear on CT abd/pelvis.  -  Lactic acid 3.8 (may not be accurate in setting of liver disease).      BUN creatinine elevated, restart IV fluids, will monitor for respiratory distress   Empiric antibiotics namely vancomycin and Zosyn for underlying sepsis?   CT abd/pelvis:  No biliary ductal dilation or evidence of bowel obstruction/diverticulitis - F/u blood cultures - F/u urine  culture  Metabolic encephalopathy, may be due to sepsis, dehydration, hepatic encephalopathy -  CT head:  No acute problems or obvious mets but done without contrast -  Ammonia level 56, will repeat  -  SLP evaluation for swallow and speech -  Aspiration precaution    will start oral lactulose  hyperkalemia due to AKI and possibly tissue necrosis from large mets -   IVF  -  Sodium bicarb amps given -  Repeat BMP in a few hours, improved -  no kayexalate until bowels investigated  AKI with metabolic acidosis, likely due to dehydration, elevated BUN -  RUS a week ago showed no hydronephrosis -  CT today, kidneys do not appear severely distended -  IVF -  Repeat BMP in AM  Liver failure with rising transaminitis, elevated bilirubin, rising INR.  Likely due to dehydration plus progressive malignancy.  Doubt cholecystitis -  Bilirubin is now up to 4.6 alk phos 482 -  Check INR -  Platelets are still normal  At risk for adrenal insufficiency.  On dexamethasone 35m daily at home -  Increase to dexamethasone 436mIV BID and start stress dose if she decompensates  Generalized weakness -  PT/OT -  Falls precautions  Metastatic colon cancer, progressive with increasing pain -  Appreciate Dr. ShGearldine Shownssistance -  Husband prefers full code -  Morphine prn pain Husband would like patient to eat patient has not eaten in 2 days, will order speech therapy evaluation and see the patient can swallow  Hypertension, -  Hold medications  Systolic congestive heart  failure, EF 35-40% -  Daily weights -  Strict I/O -  Monitor for dyspnea  Lower extremity edema likely due to heart failure/anasarca and probably has some IVC compression from hepatomegaly -  Duplex lower extremity:  Negative for DVT     DVT prophylaxsis heparin  Code Status:  Full code    Family Communication: Discussed in detail with the patient, all imaging results, lab results explained to the patient    Disposition Plan:  Pending oncology and palliative care recommendations     Consultants:  Oncology  Procedures:  None  Antibiotics: Anti-infectives    Start     Dose/Rate Route Frequency Ordered Stop   04/06/17 1800  vancomycin (VANCOCIN) IVPB 750 mg/150 ml premix     750 mg 150 mL/hr over 60 Minutes Intravenous Every 24 hours 03/14/2017 1528     04/07/2017 2200  piperacillin-tazobactam (ZOSYN) IVPB 3.375 g     3.375 g 12.5 mL/hr over 240 Minutes Intravenous Every 8 hours 03/09/2017 1528     03/16/2017 1515  piperacillin-tazobactam (ZOSYN) IVPB 3.375 g     3.375 g 100 mL/hr over 30 Minutes Intravenous  Once 03/12/2017 1504 03/14/2017 1916   03/19/2017 1515  vancomycin (VANCOCIN) IVPB 1000 mg/200 mL premix     1,000 mg 200 mL/hr over 60 Minutes Intravenous  Once 04/02/2017 1504 03/09/2017 1717   03/24/2017 1300  cefTRIAXone (ROCEPHIN) 1 g in dextrose 5 % 50 mL IVPB     1 g 100 mL/hr over 30 Minutes Intravenous  Once 03/14/2017 1251 04/04/2017 1438         HPI/Subjective: Patient minimally conversive, husband is by the bedside with does not want to talk about CODE STATUS, would like her to eat something today  Objective: Vitals:   04/06/17 0344 04/06/17 0400 04/06/17 0500 04/06/17 0600  BP:  118/88 125/90 117/87  Pulse:  (!) 108 (!) 101 (!) 107  Resp:  _0 Temp:      TempSrc:      SpO2:  96% 98% 100%  Weight: 79.2 kg (174 lb 9.7 oz)     Height:        Intake/Output Summary (Last 24 hours) at 04/06/17 0800 Last data filed at 04/06/17 0600  Gross per 24 hour  Intake             1575 ml  Output              230 ml  Net             1345 ml    Exam:  Examination:  Cardiovascular: Regular rate and rhythm, no murmurs / rubs / gallops.  2+ radial pulses. Abdomen:  Normal active bowel sounds, massive hepatomegaly throughout the right abdomen into the pelvis with palpable nodules/masses within the liver and some fungated areas with overlying ecchymotic appearing sking in the  upper right to epigastric area.  Left lower quadrant with good bowel sounds, soft, nondistended, but still TTP without rebound or guarding.  Liver TTP also.   Musculoskeletal: decreased muscle tone and bulk.  No contractures.  2+ soft pitting edema of the bilateral lower extremities Skin:  no rashes, abrasions, or ulcers.  Mass on abdomen as above Neurologic:  Symmetric appearing facial expressions.  Moves both hands and wiggles toes on both feet, right more than left.  Unable to maintain eye contact.   Psychiatry: Judgement and insight appear normal. Mood & affect appropriate.     Data Reviewed: I have  personally reviewed following labs and imaging studies  Micro Results Recent Results (from the past 240 hour(s))  MRSA PCR Screening     Status: None   Collection Time: 03/25/2017  6:13 PM  Result Value Ref Range Status   MRSA by PCR NEGATIVE NEGATIVE Final    Comment:        The GeneXpert MRSA Assay (FDA approved for NASAL specimens only), is one component of a comprehensive MRSA colonization surveillance program. It is not intended to diagnose MRSA infection nor to guide or monitor treatment for MRSA infections.     Radiology Reports Ct Abdomen Wo Contrast  Result Date: 03/20/2017 CLINICAL DATA:  Advanced metastatic colon cancer with dehydration and likely hepatic encephalopathy. EXAM: CT ABDOMEN WITHOUT CONTRAST TECHNIQUE: Multidetector CT imaging of the abdomen was performed following the standard protocol without IV contrast. COMPARISON:  02/21/2017 FINDINGS: Assessment of solid and hollow visceral organ pathology is limited by lack of IV and oral contrast. Lower chest: There has been interval clearing of small right pleural effusion with some residual subpleural consolidations likely representing atelectasis noted at the right lung base. There is cardiomegaly without pericardial effusion. No pneumonic consolidations. The lingular and left lower lobe pulmonary nodules noted on  recent comparison study are not visualized and are likely not included as part of this study. Hepatobiliary: In homogeneous attenuation of the liver consistent with known hepatic metastasis with coarse intraparenchymal densities occupying much of the medial and portions of the lateral segment of the left hepatic lobe. No biliary dilatation is identified. Pancreas: Unremarkable. No pancreatic ductal dilatation or surrounding inflammatory changes. Spleen: Normal in size without focal abnormality. Adrenals/Urinary Tract: No adrenal masses. Renovascular calcifications noted of the kidneys, right greater than left. No hydroureteronephrosis. Stomach/Bowel: Contracted stomach.  No bowel obstruction. Vascular/Lymphatic: Right-sided metastatic juxtacardiac lymphadenopathy is slightly larger currently estimated at 1.4 x 1.8 cm versus 1.4 x 1.6 cm previously. Bulky gastrohepatic ligament lymphadenopathy is stable estimated at 3.7 x 2.5 cm versus 3.9 x 2.5 cm previously. Minimal atherosclerosis of the abdominal aorta without aneurysm. Other: Interval slight increase in small to moderate volume of ascites. Interval increase in soft tissue metastatic masses along the anterior abdominal wall, the largest is approximately 6.1 x 4.4 cm versus 5.1 x 4.8 cm. Metastatic peritoneal implants are again noted Musculoskeletal: No aggressive lytic or blastic osseous lesions. No acute fracture. IMPRESSION: 1. Interval clearing of right small pleural effusion with residual atelectasis seen at the right lung base. Previously noted lingular and left lower lobe nodules are not visualized, likely excluded on this study. 2. Some interval increase in size of of juxtacardiac and gastrohepatic lymphadenopathy as well as metastatic subcutaneous anterior abdominal wall masses. 3. Some interval increase in ascites more so in the right hemiabdomen. 4. Hepatomegaly with ill-defined hypodense and hyperdense metastatic lesions scattered throughout the liver,  less well characterized due to lack of IV contrast. Electronically Signed   By: Ashley Royalty M.D.   On: 03/20/2017 16:03   X-ray Chest Pa And Lateral  Result Date: 03/23/2017 CLINICAL DATA:  Tachycardia EXAM: CHEST  2 VIEW COMPARISON:  02/19/2017, PET-CT 09/24/2016 FINDINGS: Right-sided central venous port tip overlies the proximal right atrium. Trace pleural effusion. Improved aeration of right base since the prior study. Stable enlarged cardiomediastinal silhouette. Slight asymmetric lucency along the right mediastinal border. IMPRESSION: 1. Trace bilateral effusions, improved aeration of right lung base since prior radiograph. Patchy residual atelectasis or infiltrate at the right base 2. Asymmetric linear lucency along the right  mediastinal contour, probable artifact, with tiny anterior pneumothorax felt unlikely Electronically Signed   By: Donavan Foil M.D.   On: 03/23/2017 23:03   Ct Head Wo Contrast  Result Date: 04/04/2017 CLINICAL DATA:  Altered mental status. History of abdominal cancer. Patient unable to communicate today. EXAM: CT HEAD WITHOUT CONTRAST TECHNIQUE: Contiguous axial images were obtained from the base of the skull through the vertex without intravenous contrast. COMPARISON:  None. FINDINGS: Brain: No evidence of acute infarction, hemorrhage, hydrocephalus, extra-axial collection or mass lesion/mass effect. There are vague areas of white matter hypoattenuation most likely due to mild chronic microvascular ischemic change. Vascular: No hyperdense vessel or unexpected calcification. Skull: Normal. Negative for fracture or focal lesion. Sinuses/Orbits: Globes and orbits are unremarkable. Visualized sinuses and mastoid air cells are clear. Other: None. IMPRESSION: 1. No acute intracranial abnormalities. Electronically Signed   By: Lajean Manes M.D.   On: 03/31/2017 17:26   US Renal  Result Date: 03/25/2017 CLINICAL DATA:  56 year old female with renal failure. History of metastatic  colon cancer. EXAM: RENAL / URINARY TRACT ULTRASOUND COMPLETE COMPARISON:  CT 02/21/2017 FINDINGS: Right Kidney: Length: 11.4 cm. Echogenicity within normal limits. No mass or hydronephrosis visualized. Left Kidney: Length: 11.3 cm. Echogenicity within normal limits. No mass or hydronephrosis visualized. Bladder: Poorly distended urinary bladder is not well evaluated. IMPRESSION: 1. Unremarkable sonographic evaluation of the bilateral kidneys. No evidence for obstruction/hydronephrosis. 2. Incomplete evaluation of the decompressed urinary bladder. Electronically Signed   By: Kristopher Oppenheim M.D.   On: 03/25/2017 13:31     CBC  Recent Labs Lab 04/04/2017 1311 04/06/17 0341  WBC 25.5* 27.8*  HGB 11.3* 10.9*  HCT 36.3 34.6*  PLT 476* 392  MCV 78.6 79.5  MCH 24.5* 25.1*  MCHC 31.1 31.5  RDW 23.7* 23.6*  LYMPHSABS 1.3  --   MONOABS 1.3*  --   EOSABS 0.0  --   BASOSABS 0.0  --     Chemistries   Recent Labs Lab 03/15/2017 1311 03/29/2017 1809 04/06/17 0341  NA 146* 145 144  K 6.5* 5.3* 5.5*  CL 115* 117* 114*  CO2 15* 15* 15*  GLUCOSE 94 138* 160*  BUN 87* 83* 82*  CREATININE 2.42* 2.17* 2.27*  CALCIUM 9.3 8.3* 8.8*  AST 353*  --  406*  ALT 95*  --  105*  ALKPHOS 482*  --  428*  BILITOT 4.6*  --  4.2*   ------------------------------------------------------------------------------------------------------------------ estimated creatinine clearance is 28.8 mL/min (A) (by C-G formula based on SCr of 2.27 mg/dL (H)). ------------------------------------------------------------------------------------------------------------------ No results for input(s): HGBA1C in the last 72 hours. ------------------------------------------------------------------------------------------------------------------ No results for input(s): CHOL, HDL, LDLCALC, TRIG, CHOLHDL, LDLDIRECT in the last 72  hours. ------------------------------------------------------------------------------------------------------------------ No results for input(s): TSH, T4TOTAL, T3FREE, THYROIDAB in the last 72 hours.  Invalid input(s): FREET3 ------------------------------------------------------------------------------------------------------------------ No results for input(s): VITAMINB12, FOLATE, FERRITIN, TIBC, IRON, RETICCTPCT in the last 72 hours.  Coagulation profile  Recent Labs Lab 03/23/2017 1627 04/06/17 0341  INR 3.93 2.21    No results for input(s): DDIMER in the last 72 hours.  Cardiac Enzymes No results for input(s): CKMB, TROPONINI, MYOGLOBIN in the last 168 hours.  Invalid input(s): CK ------------------------------------------------------------------------------------------------------------------ Invalid input(s): POCBNP   CBG: No results for input(s): GLUCAP in the last 168 hours.     Studies: Ct Abdomen Wo Contrast  Result Date: 03/26/2017 CLINICAL DATA:  Advanced metastatic colon cancer with dehydration and likely hepatic encephalopathy. EXAM: CT ABDOMEN WITHOUT CONTRAST TECHNIQUE: Multidetector CT imaging of the abdomen  was performed following the standard protocol without IV contrast. COMPARISON:  02/21/2017 FINDINGS: Assessment of solid and hollow visceral organ pathology is limited by lack of IV and oral contrast. Lower chest: There has been interval clearing of small right pleural effusion with some residual subpleural consolidations likely representing atelectasis noted at the right lung base. There is cardiomegaly without pericardial effusion. No pneumonic consolidations. The lingular and left lower lobe pulmonary nodules noted on recent comparison study are not visualized and are likely not included as part of this study. Hepatobiliary: In homogeneous attenuation of the liver consistent with known hepatic metastasis with coarse intraparenchymal densities occupying much  of the medial and portions of the lateral segment of the left hepatic lobe. No biliary dilatation is identified. Pancreas: Unremarkable. No pancreatic ductal dilatation or surrounding inflammatory changes. Spleen: Normal in size without focal abnormality. Adrenals/Urinary Tract: No adrenal masses. Renovascular calcifications noted of the kidneys, right greater than left. No hydroureteronephrosis. Stomach/Bowel: Contracted stomach.  No bowel obstruction. Vascular/Lymphatic: Right-sided metastatic juxtacardiac lymphadenopathy is slightly larger currently estimated at 1.4 x 1.8 cm versus 1.4 x 1.6 cm previously. Bulky gastrohepatic ligament lymphadenopathy is stable estimated at 3.7 x 2.5 cm versus 3.9 x 2.5 cm previously. Minimal atherosclerosis of the abdominal aorta without aneurysm. Other: Interval slight increase in small to moderate volume of ascites. Interval increase in soft tissue metastatic masses along the anterior abdominal wall, the largest is approximately 6.1 x 4.4 cm versus 5.1 x 4.8 cm. Metastatic peritoneal implants are again noted Musculoskeletal: No aggressive lytic or blastic osseous lesions. No acute fracture. IMPRESSION: 1. Interval clearing of right small pleural effusion with residual atelectasis seen at the right lung base. Previously noted lingular and left lower lobe nodules are not visualized, likely excluded on this study. 2. Some interval increase in size of of juxtacardiac and gastrohepatic lymphadenopathy as well as metastatic subcutaneous anterior abdominal wall masses. 3. Some interval increase in ascites more so in the right hemiabdomen. 4. Hepatomegaly with ill-defined hypodense and hyperdense metastatic lesions scattered throughout the liver, less well characterized due to lack of IV contrast. Electronically Signed   By: Ashley Royalty M.D.   On: 03/27/2017 16:03   Ct Head Wo Contrast  Result Date: 04/04/2017 CLINICAL DATA:  Altered mental status. History of abdominal cancer.  Patient unable to communicate today. EXAM: CT HEAD WITHOUT CONTRAST TECHNIQUE: Contiguous axial images were obtained from the base of the skull through the vertex without intravenous contrast. COMPARISON:  None. FINDINGS: Brain: No evidence of acute infarction, hemorrhage, hydrocephalus, extra-axial collection or mass lesion/mass effect. There are vague areas of white matter hypoattenuation most likely due to mild chronic microvascular ischemic change. Vascular: No hyperdense vessel or unexpected calcification. Skull: Normal. Negative for fracture or focal lesion. Sinuses/Orbits: Globes and orbits are unremarkable. Visualized sinuses and mastoid air cells are clear. Other: None. IMPRESSION: 1. No acute intracranial abnormalities. Electronically Signed   By: Lajean Manes M.D.   On: 04/08/2017 17:26      Lab Results  Component Value Date   HGBA1C 5.7 (H) 04/08/2015   Lab Results  Component Value Date   CREATININE 2.27 (H) 04/06/2017       Scheduled Meds: . dexamethasone  4 mg Intravenous Q12H  . lactulose  20 g Oral TID   Continuous Infusions: . sodium chloride    . dextrose 5 % and 0.45% NaCl 100 mL/hr at 04/06/17 0600  . piperacillin-tazobactam (ZOSYN)  IV 3.375 g (04/06/17 0512)  . vancomycin  LOS: 1 day    Time spent: >30 MINS    Reyne Dumas  [facility] Pager [phone]. If 7PM-7AM, please contact night-coverage at [email or web address], password [facility] 04/06/2017, 8:00 AM  LOS: 1 day

## 2017-04-06 NOTE — Progress Notes (Signed)
OT Cancellation Note  Patient Details Name: Christina [name] MRN: [id number] DOB: Aug 17, 1960   Cancelled Treatment:    Reason Eval/Treat Not Completed: Other (comment) OT order received but eval deferred at request of RN pending palliative care consult.  Will follow.   Betsy Pries 04/06/2017, 10:10 AM

## 2017-04-07 ENCOUNTER — Ambulatory Visit: Payer: Self-pay | Admitting: Nurse Practitioner

## 2017-04-07 ENCOUNTER — Other Ambulatory Visit: Payer: Self-pay

## 2017-04-07 DIAGNOSIS — G9340 Encephalopathy, unspecified: Secondary | ICD-10-CM

## 2017-04-07 DIAGNOSIS — N19 Unspecified kidney failure: Secondary | ICD-10-CM

## 2017-04-07 LAB — CBC
HCT: 35 % — ABNORMAL LOW (ref 36.0–46.0)
HEMOGLOBIN: 10.9 g/dL — AB (ref 12.0–15.0)
MCH: 24.6 pg — AB (ref 26.0–34.0)
MCHC: 31.1 g/dL (ref 30.0–36.0)
MCV: 79 fL (ref 78.0–100.0)
Platelets: 450 10*3/uL — ABNORMAL HIGH (ref 150–400)
RBC: 4.43 MIL/uL (ref 3.87–5.11)
RDW: 23.9 % — ABNORMAL HIGH (ref 11.5–15.5)
WBC: 29 10*3/uL — ABNORMAL HIGH (ref 4.0–10.5)

## 2017-04-07 LAB — COMPREHENSIVE METABOLIC PANEL
ALT: 130 U/L — ABNORMAL HIGH (ref 14–54)
ANION GAP: 15 (ref 5–15)
AST: 479 U/L — AB (ref 15–41)
Albumin: 1.9 g/dL — ABNORMAL LOW (ref 3.5–5.0)
Alkaline Phosphatase: 508 U/L — ABNORMAL HIGH (ref 38–126)
BUN: 94 mg/dL — ABNORMAL HIGH (ref 6–20)
CHLORIDE: 116 mmol/L — AB (ref 101–111)
CO2: 14 mmol/L — AB (ref 22–32)
Calcium: 8.9 mg/dL (ref 8.9–10.3)
Creatinine, Ser: 2.68 mg/dL — ABNORMAL HIGH (ref 0.44–1.00)
GFR calc non Af Amer: 19 mL/min — ABNORMAL LOW (ref >60)
GFR, EST AFRICAN AMERICAN: 22 mL/min — AB (ref >60)
Glucose, Bld: 150 mg/dL — ABNORMAL HIGH (ref 65–99)
POTASSIUM: 5.5 mmol/L — AB (ref 3.5–5.1)
Sodium: 145 mmol/L (ref 135–145)
Total Bilirubin: 5.2 mg/dL — ABNORMAL HIGH (ref 0.3–1.2)
Total Protein: 6 g/dL — ABNORMAL LOW (ref 6.5–8.1)

## 2017-04-07 LAB — GLUCOSE, CAPILLARY: Glucose-Capillary: 101 mg/dL — ABNORMAL HIGH (ref 65–99)

## 2017-04-07 MED ORDER — NALOXONE HCL 0.4 MG/ML IJ SOLN: 0.4000 mg | INTRAMUSCULAR | Status: DC | PRN

## 2017-04-07 MED ORDER — DIPHENHYDRAMINE HCL 50 MG/ML IJ SOLN: 12.5000 mg | Freq: Four times a day (QID) | INTRAMUSCULAR | Status: DC | PRN

## 2017-04-07 MED ORDER — SODIUM CHLORIDE 0.9% FLUSH: 9.0000 mL | INTRAVENOUS | Status: DC | PRN

## 2017-04-07 MED ORDER — FENTANYL 40 MCG/ML IV SOLN
INTRAVENOUS | Status: DC
  Administered 2017-04-07: 15:00:00 via INTRAVENOUS
  Administered 2017-04-07: 82.84 ug via INTRAVENOUS
  Administered 2017-04-08: 56.88 ug via INTRAVENOUS
  Administered 2017-04-08: 52.3 ug via INTRAVENOUS
  Administered 2017-04-08: 102.2 ug via INTRAVENOUS
  Administered 2017-04-08: 64.24 ug via INTRAVENOUS
  Administered 2017-04-08: 62.28 ug via INTRAVENOUS
  Filled 2017-04-07: qty 25

## 2017-04-07 MED ORDER — DIPHENHYDRAMINE HCL 12.5 MG/5ML PO ELIX: 12.5000 mg | ORAL_SOLUTION | Freq: Four times a day (QID) | ORAL | Status: DC | PRN

## 2017-04-07 NOTE — Progress Notes (Signed)
Pharmacy Antibiotic Note  Christina [name] is a 56 y.o. female admitted on 03/13/2017 with sepsis-->SIRS vs IAI.  Pharmacy has been consulted for vancomycin and zosyn dosing.  Past history of metastatic colon cancer to the liver, HFrEF, HTN, iron deficiency .   04/07/2017:   Afebrile  WBC continues to increase on steroids (29)  Renal function continues to worsen (Scr 2.68 with CrCl~78ml/min, 24I/O +1.8L)  Cx data remains negative  Plan:   Continue Zosyn 3.375gm IV Q8h to be infused over 4hrs  Check Vanc trough prior to 6pm dose.  Will d/c standing Vanc dose in anticipation of elevated level.    Monitor renal function, cx data, & patient progress   Height: 5\' 5"  (165.1 cm) Weight: 174 lb 9.7 oz (79.2 kg) IBW/kg (Calculated) : 57  Temp (24hrs), Avg:97.8 F (36.6 C), Min:96.5 F (35.8 C), Max:99.1 F (37.3 C)   Recent Labs Lab 04/06/2017 1311 04/03/2017 1501 03/19/2017 1809 03/09/2017 1822 04/06/17 0341 04/07/17 0614  WBC 25.5*  --   --   --  27.8* 29.0*  CREATININE 2.42*  --  2.17*  --  2.27* 2.68*  LATICACIDVEN  --  3.8*  --  3.7*  --   --     Estimated Creatinine Clearance: 24.4 mL/min (A) (by C-G formula based on SCr of 2.68 mg/dL (H)).    Allergies  Allergen Reactions  . Lactose Intolerance (Gi) Other (See Comments)    Diarrhea, Bloated, Abdominal pain.  Marland Kitchen Morphine And Related Anaphylaxis    Sedation  . Dilaudid [Hydromorphone Hcl]     Severe cramping   . Lasix [Furosemide] Nausea And Vomiting    Problems with IV only  . Lorazepam Other (See Comments)    Pt couldn't walk straight, put her in a daze   Antimicrobials this admission:  8/28 CTX x1 8/28 Vanc>> 8/28 zosyn>> Dose adjustments this admission:  8/30 1730 VT= ______ on 750mg  IV q24h (before 3rd dose) Microbiology results:  8/28 Ucx: NGF 8/28 bcx x2:NGTD 8/28 MRSA PCR: neg  Netta Cedars, PharmD, BCPS Pager: [phone] 04/07/2017 9:46 AM

## 2017-04-07 NOTE — Progress Notes (Signed)
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  Objective: Vital signs in last 24 hours: Blood pressure (!) 139/108, pulse (!) 114, temperature 99.1 F (37.3 C), resp. rate (!) 22, height 5\' 5"  (1.651 m), weight 174 lb 9.7 oz (79.2 kg), SpO2 98 %.  Intake/Output from previous day: 08/29 0701 - 08/30 0700 In: 1860 [I.V.:1810; IV Piggyback:50] Out: 32 [Urine:31; Stool:1]  Physical Exam:   Lungs:  clear anteriorly, no respiratory distress Cardiac: Regular rate and rhythm Abdomen:  Distended with marked hepatomegaly Extremities:  pitting edema at the lower leg bilaterally neurologic: Lethargic, opens eyes, not speaking Portacath/PICC-without erythema  Lab Results:  Recent Labs  04/06/17 0341 04/07/17 0614  WBC 27.8* 29.0*  HGB 10.9* 10.9*  HCT 34.6* 35.0*  PLT 392 450*    BMET  Recent Labs  04/06/17 0341 04/07/17 0614  NA 144 145  K 5.5* 5.5*  CL 114* 116*  CO2 15* 14*  GLUCOSE 160* 150*  BUN 82* 94*  CREATININE 2.27* 2.68*  CALCIUM 8.8* 8.9    Lab Results  Component Value Date   CEA1 964.24 (H) 03/17/2017     Medications: I have reviewed the patient's current medications.  Assessment/Plan: 1. Metastatic colon cancer-most recently treated with FOLFIRI/Avastin 03/03/2017 2. History of congestive heart failure 3. Anemia secondary to chemotherapy, chronic disease, and thalassemia trait, status post a red cell transfusion 03/25/2017 4. Pain secondary to metastatic colon cancer involving the liver 5. Admission 03/23/2017 with hypotension/tachycardia-secondary to dehydration 6. Dehydration/renal failure 7. History of an elevated corrected calcium level  8. Elevated ammonia  9. 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 I discussed the situation with her husband and  sister. The husband indicated he wishes for her to remain on a full CODE STATUS and does not wish to discuss her CODE STATUS further.  The family would like to take her home with Hospice care. I explained her lifespan will likely be measured in hours to a few days. They understand and are comfortable taking her home.  I discussed the case with Dr. Domingo Cocking. He will recommend a fentanyl drip for pain management. I will serve as the primary physician with the Hospice team.  I will be out on 04/08/2017. Please call Oncology as needed.   Recommendations: 1. Comfort care measures 2. Home Hospice care 3. Continue  CODE STATUS discussion when family is amenable    LOS: 2 days   [name], MD   04/07/2017, 7:25 AM

## 2017-04-07 NOTE — Progress Notes (Signed)
OT Cancellation Note  Patient Details Name: Christina [name] MRN: [id number] DOB: 1960/12/03   Cancelled Treatment:    Reason Eval/Treat Not Completed: Other (comment). Spoke to [employer].  Pt is lethargic and plan is for home hospice tomorrow. Will sign off.  Lianette Broussard 04/07/2017, 10:58 AM  Lesle Chris, OTR/L [phone] 04/07/2017

## 2017-04-07 NOTE — Progress Notes (Addendum)
RN verified compatibility of maintenance fluids with Zosyn and Fentanyl. Verified with pharmacist, Leodis Sias, that medications can be ran through pt only venous access, her port-a-cath. Pt to be discharged to hospice tomorrow. Will continue to monitor.

## 2017-04-07 NOTE — Progress Notes (Signed)
Daily Progress Note   Patient Name: Christina [name]       Date: 04/07/2017 DOB: 01-27-1961  Age: 56 y.o. MRN#: [id number] Attending Physician: Reyne Dumas, MD Primary Care Physician: Benito Mccreedy, MD Admit Date: 04/06/2017  Reason for Consultation/Follow-up: Establishing goals of care  Subjective: Christina [name] is resting in bed. She does not appear to be in distress. She has received Fentanyl doses around every 2 hours. Per husband, Dr. Learta Codding and family have discussed home hospice with a Fentanyl pump. He is aware she will not receive IV antibiotics upon discharge. They would like to remain full code as this is what the patient requested.   We will touch base with Dr. Learta Codding to see if he would like [place] to start Fentanyl pump.     Length of Stay: 2  Current Medications: Scheduled Meds:  . Chlorhexidine Gluconate Cloth  6 each Topical Daily  . dexamethasone  4 mg Intravenous Q12H  . heparin subcutaneous  5,000 Units Subcutaneous Q8H  . lactulose  20 g Oral TID  . sodium chloride flush  10-40 mL Intracatheter Q12H    Continuous Infusions: . sodium chloride    . dextrose 5 % and 0.45% NaCl 100 mL/hr (04/07/17 1107)  . piperacillin-tazobactam (ZOSYN)  IV 3.375 g (04/07/17 0620)    PRN Meds: bisacodyl, fentaNYL (SUBLIMAZE) injection, ondansetron **OR** ondansetron (ZOFRAN) IV, sodium chloride flush  Physical Exam  Constitutional: No distress.  HENT:  Head: Normocephalic and atraumatic.  No grimacing  Eyes:  Opens eyes intermittently.   Cardiovascular:  Cool to the touch, dry.   Pulmonary/Chest: Effort normal. No respiratory distress.  Neurological:  Opens eyes intermittently.             Vital Signs: BP (!) 118/94 (BP Location: Left Arm)   Pulse 97   Temp (!)  94.4 F (34.7 C) (Axillary)   Resp 17   Ht 5\' 5"  (1.651 m)   Wt 79.2 kg (174 lb 9.7 oz)   SpO2 98%   BMI 29.06 kg/m  SpO2: SpO2: 98 % O2 Device: O2 Device: Not Delivered O2 Flow Rate:    Intake/output summary:  Intake/Output Summary (Last 24 hours) at 04/07/17 1330 Last data filed at 04/07/17 1000  Gross per 24 hour  Intake  1660 ml  Output               32 ml  Net             1628 ml   LBM: Last BM Date: 04/07/17 Baseline Weight: Weight: 71.6 kg (157 lb 13.6 oz) Most recent weight: Weight: 79.2 kg (174 lb 9.7 oz)       Palliative Assessment/Data: 20% at best      Patient Active Problem List   Diagnosis Date Noted  . Sepsis ([place]) 03/09/2017  . Acute metabolic encephalopathy 85/09/7739  . Hyperkalemia 03/20/2017  . Acute kidney injury ([place]) 03/25/2017  . Transaminitis 03/18/2017  . Elevated bilirubin 03/22/2017  . Lower extremity edema 03/17/2017  . Hypotension 03/23/2017  . Dehydration 03/23/2017  . SIRS (systemic inflammatory response syndrome) ([place]) 03/23/2017  . Weakness 03/23/2017  . Hypercalcemia 03/23/2017  . Goals of care, counseling/discussion 07/22/2016  . Congestive dilated cardiomyopathy ([place]) 06/17/2016  . Community acquired pneumonia of left lower lobe of lung ([place]) 06/16/2016  . Acute respiratory failure with hypoxia ([place]) 06/16/2016  . Acute on chronic combined systolic and diastolic congestive heart failure ([place]) 06/16/2016  . Port catheter in place 12/04/2015  . Chemotherapy induced neutropenia ([place]) 08/07/2015  . Rectal pain 07/02/2015  . Metastatic colon cancer to liver ([place]) 04/08/2015  . Hypertension   . Thalassanemia   . Nausea & vomiting 04/06/2015  .  Pelvic mass - 9cm - ?fibroid vs drop metastasis 04/06/2015  . Hypokalemia 04/06/2015  . Iron deficiency anemia due to chronic blood loss 04/06/2015    Palliative Care Assessment & Plan   Patient Profile: Christina [name] is a 56 y.o.femalewith medical history significant  of metastatic colon cancer, HFrEF, HTN, iron deficiency anemia/beta thal.  Assessment: Christina [name] appears very weak and is immanently dying.    Recommendations/Plan:  Home with hospice.   Her care team has attempted to address code status multiple times.  Her husband is adamant that she remain a full code.   Code Status:    Code Status Orders        Start     Ordered   03/11/2017 1809  Full code  Continuous     04/03/2017 1809    Code Status History    Date Active Date Inactive Code Status Order ID Comments User Context   03/23/2017  5:56 PM 03/27/2017 12:18 PM Full Code [id number]  Ladene Artist., MD Inpatient   06/16/2016 11:06 PM 06/20/2016  5:52 PM Full Code [id number]  Rise Patience, MD ED   04/08/2015  6:59 PM 04/11/2015  1:37 PM Full Code [id number]  Michael Boston, MD Inpatient   04/06/2015  1:41 PM 04/08/2015  6:59 PM Full Code [id number]  Rai, Vernelle Emerald, MD Inpatient       Prognosis:   Hours - Days Temperature is 94.4. Her extremities are cool to the touch.    Discharge Planning:  Home with Hospice- This is a difficult situation as family reports desire for comfort and to transition home with hospice for end of life, but also report desire for aggressive measures when death does occur.  Her husband is very fixed in maintaining FULL CODE as "she has always said that."  He is not willing to engage in conversation to determine what is his hope by desiring aggressive measures at the time of her death.  Care plan was discussed with Dr. Learta Codding   Thank you for allowing the Palliative Medicine Team to assist  in the care of this patient.   Time In: 13:00 Time Out: 13:46 Total Time 46 minutes Prolonged Time Billed  No      Greater than 50%  of this time was spent counseling and coordinating care related to the above assessment and plan.  Asencion Gowda, NP  Please contact Palliative Medicine Team phone at [phone] for questions and concerns.   I saw and examined  [name] in conjunction with Asencion Gowda.  Discussed with her husband plan for home with hospice.  I called Dr. [name] and we discussed his plan for home with fentanyl PCA.  He was agreeable to me starting fentany 17mcg/hr basal with additional 24mcg every 15 minutes as needed.  On discharge, would also recommend scripts for: - Lorazepam 2mg /ml concentrated solution: 1mg  (0.48ml) sublingual every 4 hours as needed for anxiety: Disp 10ml - Haldol 2mg /ml solution: 0.5mg  (0.28ml) sublingual every 4 hours as needed for agitation or nausea: Disp 66ml  - Atropine opthalmic 1%: 3 drops sublingual every 4 hours as needed for excessive secretions: Disp 37ml  Micheline Rough, MD [facility] Team [phone]

## 2017-04-07 NOTE — Progress Notes (Signed)
PT Cancellation Note  Patient Details Name: Christina [name] MRN: [id number] DOB: [birth year]   Cancelled Treatment:    Reason Eval/Treat Not Completed: Medical issues which prohibited therapy;PT screened, no needs identified, will sign off   [facility] 04/07/2017, 11:20 AM

## 2017-04-07 NOTE — Progress Notes (Signed)
Pharmacy: vancomycin f/u note:  Patient's husband refused vancomycin trough lab draw as he was told by MD that no more labs would be drawn.  Plan: DC vancomycin trough level  Eudelia Bunch, Pharm.D. [phone] 04/07/2017 7:12 PM

## 2017-04-07 NOTE — Progress Notes (Signed)
Date: April 07, 2017 dme for home use sent to [facility]. Vernia Buff, [phone]

## 2017-04-07 NOTE — Progress Notes (Signed)
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 [name].  History provided by husband as patient is unable to reply due to confusion/illness/encephalopathy.  She last received chemotherapy in July and was hospitalized from 8/15 through 8/19 with dehydration, AKI, weakness, and hypercalcemia.  She was hydrated and treated for her hypercalcemia and discharged home.  Since returning home, she has been in a lot of pain despite taking oxycodone every four hours. She took pain medicine around 6:40 AM today and was still in pain and weak so husband called EMS and she was brought to the ER.   presented with complaints of severe abdominal pain.  Assessment and plan Sepsis (tachycardia, leukocytosis, low grade fever) possibly due to UTI vs. Intraabdominal source.  Could also be SIRS from dehydration in the setting of metastatic cancer.  Lungs sound clear and bases clear on CT abd/pelvis.  -  Lactic acid 3.8 (may not be accurate in setting of liver disease).      BUN creatinine elevated, restart IV fluids, will monitor for respiratory distress   Empiric antibiotics namely vancomycin and Zosyn for underlying sepsis? Leukocytosis worsening despite broad-spectrum antibiotics   CT abd/pelvis:  No biliary ductal dilation or evidence of bowel  obstruction/diverticulitis - F/u blood cultures - F/u urine culture   Metabolic encephalopathy, may be due to sepsis, dehydration, hepatic encephalopathy -  CT head:  No acute problems or obvious mets but done without contrast -  Ammonia level now 66 -  SLP evaluation for swallow and speech -  Aspiration precaution Started on lactulose  hyperkalemia due to AKI and possibly tissue necrosis from large mets - Potassium 5.5 despite  IVF  -  Sodium bicarb amps given Not sure if the patient is a candidate for Kayexalate given metastasis  AKI with metabolic acidosis, likely due to dehydration, elevated BUN, renal function is worsening slowly -  RUS a week ago showed no hydronephrosis -  CT today, kidneys do not appear severely distended -  IVF - creatinine has increased from 1.62>2.68  Liver failure with rising transaminitis, elevated bilirubin, rising INR.  Likely due to dehydration plus progressive malignancy.  Doubt cholecystitis -   AST ALT alkaline phosphatase, bilirubin trending up -   INR increased to 3.93, now 2.21 -  Platelets are still normal  At risk for adrenal insufficiency.  On dexamethasone 4mg  daily at home -  Increase to dexamethasone 4mg  IV BID and start stress dose if she decompensates  Generalized weakness -  PT/OT -  Falls precautions  Metastatic colon cancer, progressive with increasing pain, pain management per palliative care -  Appreciate Dr. Gearldine Shown assistance -  Husband prefers full code -  Morphine prn pain  Husband would like patient to eat patient has not eaten in 2 days, has been evaluated by speech therapy  ,  started on a regular diet  Hypertension, -  Hold medications  Systolic congestive heart failure, EF 35-40% -  Daily weights -  Strict I/O -  Monitor for dyspnea  Lower extremity edema likely due to heart failure/anasarca and probably has some IVC compression from hepatomegaly -  Duplex lower extremity:  Negative for DVT     Disposition-patient is overall declining, oncology continues to refer to comfort care measures in [facility] hospice referral. Extensive family by the bedside. I notified them personally that patient's labs are deteriorating. Patient is clinically declining. Less responsive and not communicating  DVT prophylaxsis heparin  Code Status:  Full code    Family Communication: Discussed in detail with the patient, all imaging results, lab results explained to the patient   Disposition Plan:  Pending oncology and palliative care recommendations     Consultants:  Oncology  Procedures:  None  Antibiotics: Anti-infectives    Start     Dose/Rate Route Frequency Ordered Stop   04/06/17 1800  vancomycin (VANCOCIN) IVPB 750 mg/150 ml premix     750 mg 150 mL/hr over 60 Minutes Intravenous Every 24 hours 03/20/2017 1528     03/11/2017 2200  piperacillin-tazobactam (ZOSYN) IVPB 3.375 g     3.375 g 12.5 mL/hr over 240 Minutes Intravenous Every 8 hours 03/10/2017 1528     03/17/2017 1515  piperacillin-tazobactam (ZOSYN) IVPB 3.375 g     3.375 g 100 mL/hr over 30 Minutes Intravenous  Once 03/13/2017 1504 03/19/2017 1916   03/30/2017 1515  vancomycin (VANCOCIN) IVPB 1000 mg/200 mL premix     1,000 mg 200 mL/hr over 60 Minutes Intravenous  Once 03/16/2017 1504 03/12/2017 1717   03/17/2017 1300  cefTRIAXone (ROCEPHIN) 1 g in dextrose 5 % 50 mL IVPB     1 g 100 mL/hr over 30 Minutes Intravenous  Once 03/14/2017 1251 03/16/2017 1438         HPI/Subjective: Patient minimally conversive, Extended family including patient's sister is by the bedside   Objective: Vitals:   04/07/17 0400 04/07/17 0500 04/07/17 0600 04/07/17 0807  BP: 130/88 (!) 127/104 (!) 139/108   Pulse:      Resp: (!) 22 (!) 21 (!) 22   Temp:    97.7 F (36.5 C)  TempSrc:    Axillary  SpO2: 98% 98% 98%   Weight:      Height:        Intake/Output Summary (Last 24 hours) at 04/07/17 0811 Last data filed at 04/07/17 0600  Gross per 24  hour  Intake             1660 ml  Output               32 ml  Net             1628 ml    Exam:  Examination:  Cardiovascular: Regular rate and rhythm, no murmurs / rubs / gallops.  2+ radial pulses. Abdomen:  Normal active bowel sounds, massive hepatomegaly throughout the right abdomen into the pelvis with palpable nodules/masses within the liver and some fungated areas with overlying ecchymotic appearing sking in the upper right to epigastric area.  Left lower quadrant with good bowel sounds, soft, nondistended, but still TTP without rebound or guarding.  Liver TTP also.   Musculoskeletal: decreased muscle tone and bulk.  No contractures.  2+ soft pitting edema of the bilateral lower extremities Skin:  no rashes, abrasions, or ulcers.  Mass on abdomen as above Neurologic:  Symmetric appearing facial expressions.  Moves both hands and wiggles toes on both feet, right more than left.  Unable to maintain eye contact.   Psychiatry: Judgement and insight appear normal. Mood & affect appropriate.     Data Reviewed: I have personally reviewed following labs and imaging studies  Micro Results Recent Results (from the past 240 hour(s))  Urine culture     Status: None   Collection Time: 04/07/2017 12:07 PM  Result Value Ref Range Status   Specimen Description URINE, CATHETERIZED  Final   Special Requests NONE  Final   Culture   Final    NO GROWTH Performed at [facility], [address] [place], [place] [address]    Report Status 04/06/2017 FINAL  Final  Culture, blood (Routine X 2) w Reflex to ID Panel     Status: None (Preliminary result)   Collection Time: 03/20/2017  5:55 PM  Result Value Ref Range Status   Specimen Description BLOOD LEFT ANTECUBITAL  Final   Special Requests IN PEDIATRIC BOTTLE Blood Culture adequate volume  Final   Culture   Final    NO GROWTH < 24 HOURS Performed at [facility], [place] [address], [place], [place] [address]    Report Status PENDING   Incomplete  MRSA PCR Screening     Status: None   Collection Time: 04/06/2017  6:13 PM  Result Value Ref Range Status   MRSA by PCR NEGATIVE NEGATIVE Final    Comment:        The GeneXpert MRSA Assay (FDA approved for NASAL specimens only), is one component of a comprehensive MRSA colonization surveillance program. It is not intended to diagnose MRSA infection nor to guide or monitor treatment for MRSA infections.   Culture, blood (Routine X 2) w Reflex to ID Panel     Status: None (Preliminary result)   Collection Time: 03/19/2017  6:25 PM  Result Value Ref Range Status   Specimen Description BLOOD LEFT ARM  Final   Special Requests IN PEDIATRIC BOTTLE Blood Culture adequate volume  Final   Culture   Final    NO GROWTH < 24 HOURS Performed at [facility], [place] [address], [place], [place] [address]    Report Status PENDING  Incomplete    Radiology Reports Ct Abdomen Wo Contrast  Result Date: 04/07/2017 CLINICAL DATA:  Advanced metastatic colon cancer with dehydration and likely hepatic encephalopathy. EXAM: CT ABDOMEN WITHOUT CONTRAST TECHNIQUE: Multidetector CT imaging of the abdomen was performed following the standard protocol without IV contrast. COMPARISON:  02/21/2017 FINDINGS: Assessment of solid and hollow visceral organ pathology is limited by lack of IV and oral contrast. Lower chest: There has been interval clearing of small right pleural effusion with some residual subpleural consolidations likely representing atelectasis noted at the right lung base. There is cardiomegaly without pericardial effusion. No pneumonic consolidations. The lingular and left lower lobe pulmonary nodules noted on recent comparison study are not visualized and are likely not included as part of this study. Hepatobiliary: In homogeneous attenuation of the liver consistent with known hepatic metastasis with coarse intraparenchymal densities occupying much of the medial and portions of the lateral  segment of the left hepatic lobe. No biliary dilatation is identified. Pancreas: Unremarkable. No pancreatic ductal dilatation or surrounding inflammatory changes. Spleen: Normal in size without focal abnormality. Adrenals/Urinary Tract: No adrenal masses. Renovascular calcifications noted of the kidneys, right greater than left. No hydroureteronephrosis. Stomach/Bowel: Contracted stomach.  No bowel  obstruction. Vascular/Lymphatic: Right-sided metastatic juxtacardiac lymphadenopathy is slightly larger currently estimated at 1.4 x 1.8 cm versus 1.4 x 1.6 cm previously. Bulky gastrohepatic ligament lymphadenopathy is stable estimated at 3.7 x 2.5 cm versus 3.9 x 2.5 cm previously. Minimal atherosclerosis of the abdominal aorta without aneurysm. Other: Interval slight increase in small to moderate volume of ascites. Interval increase in soft tissue metastatic masses along the anterior abdominal wall, the largest is approximately 6.1 x 4.4 cm versus 5.1 x 4.8 cm. Metastatic peritoneal implants are again noted Musculoskeletal: No aggressive lytic or blastic osseous lesions. No acute fracture. IMPRESSION: 1. Interval clearing of right small pleural effusion with residual atelectasis seen at the right lung base. Previously noted lingular and left lower lobe nodules are not visualized, likely excluded on this study. 2. Some interval increase in size of of juxtacardiac and gastrohepatic lymphadenopathy as well as metastatic subcutaneous anterior abdominal wall masses. 3. Some interval increase in ascites more so in the right hemiabdomen. 4. Hepatomegaly with ill-defined hypodense and hyperdense metastatic lesions scattered throughout the liver, less well characterized due to lack of IV contrast. Electronically Signed   By: Ashley Royalty M.D.   On: 03/14/2017 16:03   X-ray Chest Pa And Lateral  Result Date: 03/23/2017 CLINICAL DATA:  Tachycardia EXAM: CHEST  2 VIEW COMPARISON:  02/19/2017, PET-CT 09/24/2016 FINDINGS:  Right-sided central venous port tip overlies the proximal right atrium. Trace pleural effusion. Improved aeration of right base since the prior study. Stable enlarged cardiomediastinal silhouette. Slight asymmetric lucency along the right mediastinal border. IMPRESSION: 1. Trace bilateral effusions, improved aeration of right lung base since prior radiograph. Patchy residual atelectasis or infiltrate at the right base 2. Asymmetric linear lucency along the right mediastinal contour, probable artifact, with tiny anterior pneumothorax felt unlikely Electronically Signed   By: Donavan Foil M.D.   On: 03/23/2017 23:03   Ct Head Wo Contrast  Result Date: 03/09/2017 CLINICAL DATA:  Altered mental status. History of abdominal cancer. Patient unable to communicate today. EXAM: CT HEAD WITHOUT CONTRAST TECHNIQUE: Contiguous axial images were obtained from the base of the skull through the vertex without intravenous contrast. COMPARISON:  None. FINDINGS: Brain: No evidence of acute infarction, hemorrhage, hydrocephalus, extra-axial collection or mass lesion/mass effect. There are vague areas of white matter hypoattenuation most likely due to mild chronic microvascular ischemic change. Vascular: No hyperdense vessel or unexpected calcification. Skull: Normal. Negative for fracture or focal lesion. Sinuses/Orbits: Globes and orbits are unremarkable. Visualized sinuses and mastoid air cells are clear. Other: None. IMPRESSION: 1. No acute intracranial abnormalities. Electronically Signed   By: Lajean Manes M.D.   On: 03/25/2017 17:26   US Renal  Result Date: 03/25/2017 CLINICAL DATA:  56 year old female with renal failure. History of metastatic colon cancer. EXAM: RENAL / URINARY TRACT ULTRASOUND COMPLETE COMPARISON:  CT 02/21/2017 FINDINGS: Right Kidney: Length: 11.4 cm. Echogenicity within normal limits. No mass or hydronephrosis visualized. Left Kidney: Length: 11.3 cm. Echogenicity within normal limits. No mass or  hydronephrosis visualized. Bladder: Poorly distended urinary bladder is not well evaluated. IMPRESSION: 1. Unremarkable sonographic evaluation of the bilateral kidneys. No evidence for obstruction/hydronephrosis. 2. Incomplete evaluation of the decompressed urinary bladder. Electronically Signed   By: Kristopher Oppenheim M.D.   On: 03/25/2017 13:31     CBC  Recent Labs Lab 03/18/2017 1311 04/06/17 0341 04/07/17 0614  WBC 25.5* 27.8* 29.0*  HGB 11.3* 10.9* 10.9*  HCT 36.3 34.6* 35.0*  PLT 476* 392 450*  MCV 78.6 79.5 79.0  MCH 24.5* 25.1* 24.6*  MCHC 31.1 31.5 31.1  RDW 23.7* 23.6* 23.9*  LYMPHSABS 1.3  --   --   MONOABS 1.3*  --   --   EOSABS 0.0  --   --   BASOSABS 0.0  --   --     Chemistries   Recent Labs Lab 03/27/2017 1311 03/24/2017 1809 04/06/17 0341 04/07/17 0614  NA 146* 145 144 145  K 6.5* 5.3* 5.5* 5.5*  CL 115* 117* 114* 116*  CO2 15* 15* 15* 14*  GLUCOSE 94 138* 160* 150*  BUN 87* 83* 82* 94*  CREATININE 2.42* 2.17* 2.27* 2.68*  CALCIUM 9.3 8.3* 8.8* 8.9  AST 353*  --  406* 479*  ALT 95*  --  105* 130*  ALKPHOS 482*  --  428* 508*  BILITOT 4.6*  --  4.2* 5.2*   ------------------------------------------------------------------------------------------------------------------ estimated creatinine clearance is 24.4 mL/min (A) (by C-G formula based on SCr of 2.68 mg/dL (H)). ------------------------------------------------------------------------------------------------------------------ No results for input(s): HGBA1C in the last 72 hours. ------------------------------------------------------------------------------------------------------------------ No results for input(s): CHOL, HDL, LDLCALC, TRIG, CHOLHDL, LDLDIRECT in the last 72 hours. ------------------------------------------------------------------------------------------------------------------ No results for input(s): TSH, T4TOTAL, T3FREE, THYROIDAB in the last 72 hours.  Invalid input(s):  FREET3 ------------------------------------------------------------------------------------------------------------------ No results for input(s): VITAMINB12, FOLATE, FERRITIN, TIBC, IRON, RETICCTPCT in the last 72 hours.  Coagulation profile  Recent Labs Lab 03/15/2017 1627 04/06/17 0341  INR 3.93 2.21    No results for input(s): DDIMER in the last 72 hours.  Cardiac Enzymes No results for input(s): CKMB, TROPONINI, MYOGLOBIN in the last 168 hours.  Invalid input(s): CK ------------------------------------------------------------------------------------------------------------------ Invalid input(s): POCBNP   CBG:  Recent Labs Lab 04/07/17 0104  GLUCAP 101*       Studies: Ct Abdomen Wo Contrast  Result Date: 03/22/2017 CLINICAL DATA:  Advanced metastatic colon cancer with dehydration and likely hepatic encephalopathy. EXAM: CT ABDOMEN WITHOUT CONTRAST TECHNIQUE: Multidetector CT imaging of the abdomen was performed following the standard protocol without IV contrast. COMPARISON:  02/21/2017 FINDINGS: Assessment of solid and hollow visceral organ pathology is limited by lack of IV and oral contrast. Lower chest: There has been interval clearing of small right pleural effusion with some residual subpleural consolidations likely representing atelectasis noted at the right lung base. There is cardiomegaly without pericardial effusion. No pneumonic consolidations. The lingular and left lower lobe pulmonary nodules noted on recent comparison study are not visualized and are likely not included as part of this study. Hepatobiliary: In homogeneous attenuation of the liver consistent with known hepatic metastasis with coarse intraparenchymal densities occupying much of the medial and portions of the lateral segment of the left hepatic lobe. No biliary dilatation is identified. Pancreas: Unremarkable. No pancreatic ductal dilatation or surrounding inflammatory changes. Spleen: Normal in  size without focal abnormality. Adrenals/Urinary Tract: No adrenal masses. Renovascular calcifications noted of the kidneys, right greater than left. No hydroureteronephrosis. Stomach/Bowel: Contracted stomach.  No bowel obstruction. Vascular/Lymphatic: Right-sided metastatic juxtacardiac lymphadenopathy is slightly larger currently estimated at 1.4 x 1.8 cm versus 1.4 x 1.6 cm previously. Bulky gastrohepatic ligament lymphadenopathy is stable estimated at 3.7 x 2.5 cm versus 3.9 x 2.5 cm previously. Minimal atherosclerosis of the abdominal aorta without aneurysm. Other: Interval slight increase in small to moderate volume of ascites. Interval increase in soft tissue metastatic masses along the anterior abdominal wall, the largest is approximately 6.1 x 4.4 cm versus 5.1 x 4.8 cm. Metastatic peritoneal implants are again noted Musculoskeletal: No aggressive lytic or blastic osseous lesions. No  acute fracture. IMPRESSION: 1. Interval clearing of right small pleural effusion with residual atelectasis seen at the right lung base. Previously noted lingular and left lower lobe nodules are not visualized, likely excluded on this study. 2. Some interval increase in size of of juxtacardiac and gastrohepatic lymphadenopathy as well as metastatic subcutaneous anterior abdominal wall masses. 3. Some interval increase in ascites more so in the right hemiabdomen. 4. Hepatomegaly with ill-defined hypodense and hyperdense metastatic lesions scattered throughout the liver, less well characterized due to lack of IV contrast. Electronically Signed   By: Ashley Royalty M.D.   On: 03/19/2017 16:03   Ct Head Wo Contrast  Result Date: 03/27/2017 CLINICAL DATA:  Altered mental status. History of abdominal cancer. Patient unable to communicate today. EXAM: CT HEAD WITHOUT CONTRAST TECHNIQUE: Contiguous axial images were obtained from the base of the skull through the vertex without intravenous contrast. COMPARISON:  None. FINDINGS: Brain:  No evidence of acute infarction, hemorrhage, hydrocephalus, extra-axial collection or mass lesion/mass effect. There are vague areas of white matter hypoattenuation most likely due to mild chronic microvascular ischemic change. Vascular: No hyperdense vessel or unexpected calcification. Skull: Normal. Negative for fracture or focal lesion. Sinuses/Orbits: Globes and orbits are unremarkable. Visualized sinuses and mastoid air cells are clear. Other: None. IMPRESSION: 1. No acute intracranial abnormalities. Electronically Signed   By: Lajean Manes M.D.   On: 03/31/2017 17:26      Lab Results  Component Value Date   HGBA1C 5.7 (H) 04/08/2015   Lab Results  Component Value Date   CREATININE 2.68 (H) 04/07/2017       Scheduled Meds: . Chlorhexidine Gluconate Cloth  6 each Topical Daily  . dexamethasone  4 mg Intravenous Q12H  . heparin subcutaneous  5,000 Units Subcutaneous Q8H  . lactulose  20 g Oral TID  . sodium chloride flush  10-40 mL Intracatheter Q12H   Continuous Infusions: . sodium chloride    . dextrose 5 % and 0.45% NaCl 100 mL (04/07/17 0056)  . piperacillin-tazobactam (ZOSYN)  IV 3.375 g (04/07/17 0620)  . vancomycin Stopped (04/06/17 1940)     LOS: 2 days    Time spent: >30 MINS    Reyne Dumas  [facility] Pager [phone]. If 7PM-7AM, please contact night-coverage at [email or web address], password [facility] 04/07/2017, 8:11 AM  LOS: 2 days

## 2017-04-08 LAB — COMPREHENSIVE METABOLIC PANEL
ALBUMIN: 1.8 g/dL — AB (ref 3.5–5.0)
ALT: 130 U/L — AB (ref 14–54)
AST: 456 U/L — AB (ref 15–41)
Alkaline Phosphatase: 522 U/L — ABNORMAL HIGH (ref 38–126)
Anion gap: 16 — ABNORMAL HIGH (ref 5–15)
BUN: 96 mg/dL — AB (ref 6–20)
CHLORIDE: 114 mmol/L — AB (ref 101–111)
CO2: 13 mmol/L — AB (ref 22–32)
CREATININE: 2.83 mg/dL — AB (ref 0.44–1.00)
Calcium: 8.7 mg/dL — ABNORMAL LOW (ref 8.9–10.3)
GFR calc Af Amer: 20 mL/min — ABNORMAL LOW (ref >60)
GFR calc non Af Amer: 18 mL/min — ABNORMAL LOW (ref >60)
GLUCOSE: 138 mg/dL — AB (ref 65–99)
POTASSIUM: 5.5 mmol/L — AB (ref 3.5–5.1)
SODIUM: 143 mmol/L (ref 135–145)
Total Bilirubin: 5.2 mg/dL — ABNORMAL HIGH (ref 0.3–1.2)
Total Protein: 5.9 g/dL — ABNORMAL LOW (ref 6.5–8.1)

## 2017-04-08 LAB — CBC
HEMATOCRIT: 34.1 % — AB (ref 36.0–46.0)
Hemoglobin: 10.7 g/dL — ABNORMAL LOW (ref 12.0–15.0)
MCH: 25.2 pg — AB (ref 26.0–34.0)
MCHC: 31.4 g/dL (ref 30.0–36.0)
MCV: 80.4 fL (ref 78.0–100.0)
PLATELETS: 358 10*3/uL (ref 150–400)
RBC: 4.24 MIL/uL (ref 3.87–5.11)
RDW: 24.1 % — AB (ref 11.5–15.5)
WBC: 28.2 10*3/uL — AB (ref 4.0–10.5)

## 2017-04-08 LAB — VANCOMYCIN, RANDOM: Vancomycin Rm: 21

## 2017-04-08 MED ORDER — FENTANYL 40 MCG/ML IV SOLN
INTRAVENOUS | Status: DC
  Administered 2017-04-08: 70.21 ug via INTRAVENOUS
  Administered 2017-04-09 (×3): 0 ug via INTRAVENOUS
  Filled 2017-04-08: qty 25

## 2017-04-08 MED ORDER — VANCOMYCIN HCL IN DEXTROSE 1-5 GM/200ML-% IV SOLN
1000.0000 mg | Freq: Once | INTRAVENOUS | Status: AC
  Administered 2017-04-08: 1000 mg via INTRAVENOUS
  Filled 2017-04-08: qty 200

## 2017-04-08 MED ORDER — PIPERACILLIN-TAZOBACTAM IN DEX 2-0.25 GM/50ML IV SOLN
2.2500 g | Freq: Three times a day (TID) | INTRAVENOUS | Status: DC
  Administered 2017-04-08 – 2017-04-12 (×12): 2.25 g via INTRAVENOUS
  Filled 2017-04-08 (×14): qty 50

## 2017-04-08 MED ORDER — PIPERACILLIN-TAZOBACTAM 3.375 G IVPB: 3.3750 g | Freq: Three times a day (TID) | INTRAVENOUS | Status: DC

## 2017-04-08 NOTE — Progress Notes (Addendum)
0843/tct-Amy Ellard Artis with [facility]/message to please call back./CB from Amy please call Felisa Bonier Robertson/message left to return call./TCF-MaryANN will come and see patient/Rhonda Davis,BSN, RN3, CCM

## 2017-04-08 NOTE — Progress Notes (Signed)
After placing foley cath due to urinary retention - bladder scan showed >433ml urine, patient asked that the foley be removed about an hour after insertion.

## 2017-04-08 NOTE — Progress Notes (Addendum)
Pharmacy Antibiotic Note  Christina [name] is a 56 y.o. female with hx metastatic colon cancer to liver presented to the ED on 03/24/2017 with sepsis.  Vancomycin and zosyn were started on admission for broad coverage.  Poor prognosis but family still wants full txment for patient.  Today, 04/08/2017: - Day #3 abx - afeb, wbc up 28 -  scr up 2.83 (crcl~23)   Plan: - with worsening of renal function, will change zosyn to 2.25 gm IV q8h  - will re-dose 1000 mg IV x1 now.  Recheck random vancomycin level in 2 days and redose if < 20 - monitor renal function  ______________________________  Height: 5\' 5"  (165.1 cm) Weight: 175 lb 14.8 oz (79.8 kg) IBW/kg (Calculated) : 57  Temp (24hrs), Avg:97.5 F (36.4 C), Min:94.4 F (34.7 C), Max:99.4 F (37.4 C)   Recent Labs Lab 04/07/2017 1311 03/28/2017 1501 04/03/2017 1809 04/06/2017 1822 04/06/17 0341 04/07/17 0614 04/08/17 0335  WBC 25.5*  --   --   --  27.8* 29.0* 28.2*  CREATININE 2.42*  --  2.17*  --  2.27* 2.68* 2.83*  LATICACIDVEN  --  3.8*  --  3.7*  --   --   --     Estimated Creatinine Clearance: 23.2 mL/min (A) (by C-G formula based on SCr of 2.83 mg/dL (H)).    Allergies  Allergen Reactions  . Lactose Intolerance (Gi) Other (See Comments)    Diarrhea, Bloated, Abdominal pain.  Marland Kitchen Morphine And Related Anaphylaxis    Sedation  . Dilaudid [Hydromorphone Hcl]     Severe cramping   . Lasix [Furosemide] Nausea And Vomiting    Problems with IV only  . Lorazepam Other (See Comments)    Pt couldn't walk straight, put her in a daze   Antimicrobials this admission:  8/28 CTX x1 8/28 Vanc>> 8/28 zosyn>>  Dose adjustments this admission:  8/311200 VR =   21 (41 hrs after last dose of 750 mg) -- redose 1000 mg  Microbiology results:  8/28 Ucx: NGF 8/28 bcx x2:NGTD 8/28 MRSA PCR: neg    Thank you for allowing pharmacy to be a part of this patient's care.  Lynelle Doctor 04/08/2017 12:02 PM

## 2017-04-08 NOTE — Progress Notes (Signed)
Nutrition Brief Note  Patient identified on the low [place] screening report.   Wt Readings from Last 15 Encounters:  04/08/17 175 lb 14.8 oz (79.8 kg)  03/24/17 145 lb 12.8 oz (66.1 kg)  03/23/17 143 lb 4.8 oz (65 kg)  03/17/17 142 lb 14.4 oz (64.8 kg)  03/09/17 147 lb 12.8 oz (67 kg)  03/03/17 148 lb 8 oz (67.4 kg)  02/21/17 153 lb (69.4 kg)  02/19/17 153 lb (69.4 kg)  02/17/17 153 lb 3.2 oz (69.5 kg)  02/15/17 153 lb 4 oz (69.5 kg)  02/11/17 154 lb 6.4 oz (70 kg)  01/14/17 160 lb 3.2 oz (72.7 kg)  01/06/17 160 lb 8 oz (72.8 kg)  12/23/16 159 lb 12.8 oz (72.5 kg)  12/09/16 162 lb 4.8 oz (73.6 kg)    Body mass index is 29.28 kg/m. Patient meets criteria for overweight based on current BMI. Skin WDL. Pt with hx of metastatic colon cancer with liver and lung mets, CHF, and HTN. Her last chemo was in July. She was admitted d/t weakness and poor pain control at home, only tolerating liquids and not solid food. Pt is being followed by Palliative Care and plan is for d/c home today with home hospice although family desires for pt to remain full code. Per Dr. Kirstie Mirza note yesterday afternoon prognosis is hours to days with temperature of 94.4 degrees and extremities being cool to the touch.   Current diet order is Regular, but pt is not consuming anything.  Medications reviewed; 20 g lactulose TID.  Labs reviewed; K: 5.5 mmol/L, Cl: 114 mmol/L, BUN: 96 mg/dL (trending up), creatinine: 2.83 mg/dL (trending up), Ca: 8.7 mg/dL, Alk Phos elevated (trending up), LFTs elevated, GFR: 18 mL/min.   IVF: D5-1/2 NS @ 100 mL/hr (408 kcal from dextrose).   No nutrition interventions warranted at this time. If nutrition issues arise, please consult RD.     Jarome Matin, MS, RD, LDN, [facility] Inpatient Clinical Dietitian Pager # [phone] After hours/weekend pager # [phone]

## 2017-04-08 NOTE — Progress Notes (Signed)
[facility] Liaison: RN visit  Notified by Velva Harman, [facility] of family request for [facility] services at home after discharge.   After speaking with the family, the patient's husband notified Dr. Domingo Cocking that he has changed his mind and does not want hospice services at this time.    Thank you,  Charlett Blake, [facility] Liaison [phone]   All hospital liaisons are on [place].

## 2017-04-08 NOTE — Progress Notes (Signed)
Pt has not voided. 100 cc in bladder per bladder scan. RN notified on-call for [facility]. Will continue to monitor.

## 2017-04-08 NOTE — Progress Notes (Signed)
Pt has not voided. 125 cc in bladder per bladder scan. RN notified on-call for [facility]. Will continue to monitor.

## 2017-04-08 NOTE — Progress Notes (Signed)
Vancomycin trough level drawn per husband's permission. Sent to lab.

## 2017-04-08 NOTE — Progress Notes (Signed)
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 [name].  History provided by husband as patient is unable to reply due to confusion/illness/encephalopathy.  She last received chemotherapy in July and was hospitalized from 8/15 through 8/19 with dehydration, AKI, weakness, and hypercalcemia.  She was hydrated and treated for her hypercalcemia and discharged home.  Since returning home, she has been in a lot of pain despite taking oxycodone every four hours. She took pain medicine around 6:40 AM today and was still in pain and weak so husband called EMS and she was brought to the ER.   presented with complaints of severe abdominal pain. Patient has declined since admission, husband refuses palliative care and want to continue full medical care   Assessment and plan Sepsis (tachycardia, leukocytosis, low grade fever) possibly due to UTI vs. Intraabdominal source.   Could also be SIRS from dehydration in the setting of metastatic cancer.  Lungs sound clear and bases clear on CT abd/pelvis.  Lactic acid 3.8 (may not be accurate in setting of liver disease).      BUN creatinine elevated, restart IV fluids, will monitor for respiratory distress   Empiric antibiotics namely vancomycin and Zosyn for underlying sepsis? Leukocytosis  worsening despite broad-spectrum antibiotics   CT abd/pelvis:  No biliary ductal dilation or evidence of bowel obstruction/diverticulitis - F/u blood cultures - F/u urine culture   Metabolic encephalopathy, may be due to sepsis, dehydration, hepatic encephalopathy -  CT head:  No acute problems or obvious mets but done without contrast -  Ammonia level now 66, continue lactulose and recheck ammonia level tomorrow -  SLP evaluation for swallow and speech-started on regular diet for comfort -  Aspiration precaution    hyperkalemia due to AKI and possibly tissue necrosis from large mets - Potassium 5.5 despite  IVF  -  Sodium bicarb amps given Not sure if the patient is a candidate for Kayexalate given metastasis  AKI with metabolic acidosis, likely due to dehydration, elevated BUN, renal function is worsening slowly -  RUS a week ago showed no hydronephrosis -  CT today, kidneys do not appear severely distended -  IVF - creatinine has increased from 1.62>2.68>2.83 , despite IV fluids  Liver failure with rising transaminitis, elevated bilirubin, rising INR.  Likely due to dehydration plus progressive malignancy.  Doubt cholecystitis -   AST ALT alkaline phosphatase, bilirubin trending up -   INR increased to 3.93, now 2.21 -  Platelets are still normal  At risk for adrenal insufficiency.  On dexamethasone 4mg  daily at home -  Increase to dexamethasone 4mg  IV BID and start stress dose if she decompensates  Generalized weakness -  PT/OT -  Falls precautions  Metastatic colon cancer, progressive with increasing pain, pain management per palliative care -  Appreciate Dr. Gearldine Shown assistance, he has  repeatedly recommended home with hospice -  Husband prefers full code, wants to continue IV fluids, antibiotics -   pain control as per palliative care recommendations  Husband would like patient to eat patient has not eaten in 2 days, has been evaluated by speech therapy  ,  started on a regular diet  Hypertension, -  Hold medications  Systolic congestive heart failure, EF 35-40% -  Daily weights -  Strict I/O -  Monitor for dyspnea  Lower extremity edema likely due to heart failure/anasarca and probably has some IVC compression from hepatomegaly -  Duplex lower extremity:  Negative for DVT    Disposition-patient is overall declining, oncology continues to refer to comfort care measures in [facility] hospice referral. Extensive family by the bedside. I notified them personally that patient's labs are deteriorating. Patient is clinically declining. Less responsive and not communicating. Family wants to continue full spectrum of care  DVT prophylaxsis heparin  Code Status:  Full code    Family Communication: Discussed in detail with the patient, all imaging results, lab results explained to the patient   Disposition Plan:  Pending oncology and palliative care recommendations     Consultants:  Oncology  Palliative care  Procedures:  None  Antibiotics: Anti-infectives    Start     Dose/Rate Route Frequency Ordered Stop   04/08/17 1300  piperacillin-tazobactam (ZOSYN) IVPB 3.375 g     3.375 g 12.5 mL/hr over 240 Minutes Intravenous Every 8 hours 04/08/17 1200     04/06/17 1800  vancomycin (VANCOCIN) IVPB 750 mg/150 ml premix  Status:  Discontinued     750 mg 150 mL/hr over 60 Minutes Intravenous Every 24 hours 03/18/2017 1528 04/07/17 0944   03/16/2017 2200  piperacillin-tazobactam (ZOSYN) IVPB 3.375 g  Status:  Discontinued     3.375 g 12.5 mL/hr over 240 Minutes Intravenous Every 8 hours 03/14/2017 1528 04/08/17 1049   03/27/2017 1515  piperacillin-tazobactam (ZOSYN) IVPB 3.375 g     3.375 g 100 mL/hr over 30 Minutes Intravenous  Once 03/17/2017 1504 03/28/2017 1916   03/21/2017 1515  vancomycin (VANCOCIN) IVPB 1000 mg/200 mL premix     1,000 mg 200 mL/hr over 60 Minutes Intravenous  Once 04/07/2017 1504 03/26/2017 1717   03/15/2017 1300  cefTRIAXone  (ROCEPHIN) 1 g in dextrose 5 % 50 mL IVPB     1 g 100 mL/hr over 30 Minutes Intravenous  Once 04/02/2017 1251 03/28/2017 1438         HPI/Subjective: Patient minimally conversive, Extended family including patient's sister is by the bedside   Objective: Vitals:   04/08/17 0800 04/08/17 1000 04/08/17 1132 04/08/17 1200  BP: (!) 135/96 (!) 141/85  136/83  Pulse: (!) 101 95  97  Resp: (!) 33 (!) 22 16 15   Temp: 98.4 F (36.9 C)     TempSrc: Oral     SpO2: 96% 96% 99% 90%  Weight:      Height:        Intake/Output Summary (Last 24 hours) at 04/08/17 1241 Last data filed at 04/08/17 1200  Gross per 24 hour  Intake             2800 ml  Output               40 ml  Net             2760 ml    Exam:  Examination:  Cardiovascular: Regular rate and rhythm, no murmurs / rubs / gallops.  2+ radial pulses. Abdomen:  Normal active bowel sounds, massive hepatomegaly throughout the right abdomen into the pelvis with palpable nodules/masses within the liver and some fungated areas with overlying ecchymotic appearing sking in the upper right to epigastric area.  Left lower quadrant with good bowel sounds, soft, nondistended, but still TTP without rebound or guarding.  Liver TTP also.   Musculoskeletal: decreased muscle tone and bulk.  No contractures.  2+ soft pitting edema of the bilateral lower extremities Skin:  no rashes, abrasions, or ulcers.  Mass on abdomen as above Neurologic:  Symmetric appearing facial expressions.  Moves both hands and wiggles toes on both feet, right more than left.  Unable to maintain eye contact.   Psychiatry: Judgement and insight appear normal. Mood & affect appropriate.     Data Reviewed: I have personally reviewed following labs and imaging studies  Micro Results Recent Results (from the past 240 hour(s))  Urine culture     Status: None   Collection Time: 03/30/2017 12:07 PM  Result Value Ref Range Status   Specimen Description URINE, CATHETERIZED   Final   Special Requests NONE  Final   Culture   Final    NO GROWTH Performed at [facility], [address] [place], [place] [address]    Report Status 04/06/2017 FINAL  Final  Culture, blood (Routine X 2) w Reflex to ID Panel     Status: None (Preliminary result)   Collection Time: 03/24/2017  5:55 PM  Result Value Ref Range Status   Specimen Description BLOOD LEFT ANTECUBITAL  Final   Special Requests IN PEDIATRIC BOTTLE Blood Culture adequate volume  Final   Culture   Final    NO GROWTH 2 DAYS Performed at [facility], [place] [address], [place], [place] [address]    Report Status PENDING  Incomplete  MRSA PCR Screening     Status: None   Collection Time: 03/12/2017  6:13 PM  Result Value Ref Range Status   MRSA by PCR NEGATIVE NEGATIVE Final    Comment:        The GeneXpert MRSA Assay (FDA approved for NASAL specimens only), is one component of a comprehensive MRSA colonization surveillance program. It is not intended to diagnose MRSA infection nor to guide or monitor treatment for MRSA infections.   Culture, blood (Routine X 2) w Reflex to ID Panel     Status: None (Preliminary result)   Collection Time: 03/10/2017  6:25 PM  Result Value Ref Range Status   Specimen Description BLOOD LEFT ARM  Final   Special Requests IN PEDIATRIC BOTTLE Blood Culture adequate volume  Final   Culture   Final    NO GROWTH 2 DAYS Performed at [facility], [place] [address], [place], [place] [address]    Report Status PENDING  Incomplete    Radiology Reports Ct Abdomen Wo Contrast  Result Date: 03/22/2017 CLINICAL DATA:  Advanced metastatic colon cancer with dehydration and likely hepatic encephalopathy. EXAM: CT ABDOMEN WITHOUT CONTRAST TECHNIQUE: Multidetector CT imaging of the abdomen was performed following the standard protocol without IV contrast. COMPARISON:  02/21/2017 FINDINGS: Assessment of solid and hollow visceral organ pathology is limited by lack of IV and oral  contrast. Lower chest: There has been interval clearing of small right pleural effusion with some residual subpleural consolidations likely representing atelectasis noted at the right lung base. There is cardiomegaly without pericardial effusion. No pneumonic consolidations. The lingular and left lower lobe pulmonary nodules  noted on recent comparison study are not visualized and are likely not included as part of this study. Hepatobiliary: In homogeneous attenuation of the liver consistent with known hepatic metastasis with coarse intraparenchymal densities occupying much of the medial and portions of the lateral segment of the left hepatic lobe. No biliary dilatation is identified. Pancreas: Unremarkable. No pancreatic ductal dilatation or surrounding inflammatory changes. Spleen: Normal in size without focal abnormality. Adrenals/Urinary Tract: No adrenal masses. Renovascular calcifications noted of the kidneys, right greater than left. No hydroureteronephrosis. Stomach/Bowel: Contracted stomach.  No bowel obstruction. Vascular/Lymphatic: Right-sided metastatic juxtacardiac lymphadenopathy is slightly larger currently estimated at 1.4 x 1.8 cm versus 1.4 x 1.6 cm previously. Bulky gastrohepatic ligament lymphadenopathy is stable estimated at 3.7 x 2.5 cm versus 3.9 x 2.5 cm previously. Minimal atherosclerosis of the abdominal aorta without aneurysm. Other: Interval slight increase in small to moderate volume of ascites. Interval increase in soft tissue metastatic masses along the anterior abdominal wall, the largest is approximately 6.1 x 4.4 cm versus 5.1 x 4.8 cm. Metastatic peritoneal implants are again noted Musculoskeletal: No aggressive lytic or blastic osseous lesions. No acute fracture. IMPRESSION: 1. Interval clearing of right small pleural effusion with residual atelectasis seen at the right lung base. Previously noted lingular and left lower lobe nodules are not visualized, likely excluded on this  study. 2. Some interval increase in size of of juxtacardiac and gastrohepatic lymphadenopathy as well as metastatic subcutaneous anterior abdominal wall masses. 3. Some interval increase in ascites more so in the right hemiabdomen. 4. Hepatomegaly with ill-defined hypodense and hyperdense metastatic lesions scattered throughout the liver, less well characterized due to lack of IV contrast. Electronically Signed   By: Ashley Royalty M.D.   On: 03/17/2017 16:03   X-ray Chest Pa And Lateral  Result Date: 03/23/2017 CLINICAL DATA:  Tachycardia EXAM: CHEST  2 VIEW COMPARISON:  02/19/2017, PET-CT 09/24/2016 FINDINGS: Right-sided central venous port tip overlies the proximal right atrium. Trace pleural effusion. Improved aeration of right base since the prior study. Stable enlarged cardiomediastinal silhouette. Slight asymmetric lucency along the right mediastinal border. IMPRESSION: 1. Trace bilateral effusions, improved aeration of right lung base since prior radiograph. Patchy residual atelectasis or infiltrate at the right base 2. Asymmetric linear lucency along the right mediastinal contour, probable artifact, with tiny anterior pneumothorax felt unlikely Electronically Signed   By: Donavan Foil M.D.   On: 03/23/2017 23:03   Ct Head Wo Contrast  Result Date: 04/04/2017 CLINICAL DATA:  Altered mental status. History of abdominal cancer. Patient unable to communicate today. EXAM: CT HEAD WITHOUT CONTRAST TECHNIQUE: Contiguous axial images were obtained from the base of the skull through the vertex without intravenous contrast. COMPARISON:  None. FINDINGS: Brain: No evidence of acute infarction, hemorrhage, hydrocephalus, extra-axial collection or mass lesion/mass effect. There are vague areas of white matter hypoattenuation most likely due to mild chronic microvascular ischemic change. Vascular: No hyperdense vessel or unexpected calcification. Skull: Normal. Negative for fracture or focal lesion. Sinuses/Orbits:  Globes and orbits are unremarkable. Visualized sinuses and mastoid air cells are clear. Other: None. IMPRESSION: 1. No acute intracranial abnormalities. Electronically Signed   By: Lajean Manes M.D.   On: 04/02/2017 17:26   US Renal  Result Date: 03/25/2017 CLINICAL DATA:  56 year old female with renal failure. History of metastatic colon cancer. EXAM: RENAL / URINARY TRACT ULTRASOUND COMPLETE COMPARISON:  CT 02/21/2017 FINDINGS: Right Kidney: Length: 11.4 cm. Echogenicity within normal limits. No mass or hydronephrosis visualized. Left Kidney: Length: 11.3  cm. Echogenicity within normal limits. No mass or hydronephrosis visualized. Bladder: Poorly distended urinary bladder is not well evaluated. IMPRESSION: 1. Unremarkable sonographic evaluation of the bilateral kidneys. No evidence for obstruction/hydronephrosis. 2. Incomplete evaluation of the decompressed urinary bladder. Electronically Signed   By: Kristopher Oppenheim M.D.   On: 03/25/2017 13:31     CBC  Recent Labs Lab 03/26/2017 1311 04/06/17 0341 04/07/17 0614 04/08/17 0335  WBC 25.5* 27.8* 29.0* 28.2*  HGB 11.3* 10.9* 10.9* 10.7*  HCT 36.3 34.6* 35.0* 34.1*  PLT 476* 392 450* 358  MCV 78.6 79.5 79.0 80.4  MCH 24.5* 25.1* 24.6* 25.2*  MCHC 31.1 31.5 31.1 31.4  RDW 23.7* 23.6* 23.9* 24.1*  LYMPHSABS 1.3  --   --   --   MONOABS 1.3*  --   --   --   EOSABS 0.0  --   --   --   BASOSABS 0.0  --   --   --     Chemistries   Recent Labs Lab 03/31/2017 1311 03/14/2017 1809 04/06/17 0341 04/07/17 0614 04/08/17 0335  NA 146* 145 144 145 143  K 6.5* 5.3* 5.5* 5.5* 5.5*  CL 115* 117* 114* 116* 114*  CO2 15* 15* 15* 14* 13*  GLUCOSE 94 138* 160* 150* 138*  BUN 87* 83* 82* 94* 96*  CREATININE 2.42* 2.17* 2.27* 2.68* 2.83*  CALCIUM 9.3 8.3* 8.8* 8.9 8.7*  AST 353*  --  406* 479* 456*  ALT 95*  --  105* 130* 130*  ALKPHOS 482*  --  428* 508* 522*  BILITOT 4.6*  --  4.2* 5.2* 5.2*    ------------------------------------------------------------------------------------------------------------------ estimated creatinine clearance is 23.2 mL/min (A) (by C-G formula based on SCr of 2.83 mg/dL (H)). ------------------------------------------------------------------------------------------------------------------ No results for input(s): HGBA1C in the last 72 hours. ------------------------------------------------------------------------------------------------------------------ No results for input(s): CHOL, HDL, LDLCALC, TRIG, CHOLHDL, LDLDIRECT in the last 72 hours. ------------------------------------------------------------------------------------------------------------------ No results for input(s): TSH, T4TOTAL, T3FREE, THYROIDAB in the last 72 hours.  Invalid input(s): FREET3 ------------------------------------------------------------------------------------------------------------------ No results for input(s): VITAMINB12, FOLATE, FERRITIN, TIBC, IRON, RETICCTPCT in the last 72 hours.  Coagulation profile  Recent Labs Lab 03/12/2017 1627 04/06/17 0341  INR 3.93 2.21    No results for input(s): DDIMER in the last 72 hours.  Cardiac Enzymes No results for input(s): CKMB, TROPONINI, MYOGLOBIN in the last 168 hours.  Invalid input(s): CK ------------------------------------------------------------------------------------------------------------------ Invalid input(s): POCBNP   CBG:  Recent Labs Lab 04/07/17 0104  GLUCAP 101*       Studies: No results found.    Lab Results  Component Value Date   HGBA1C 5.7 (H) 04/08/2015   Lab Results  Component Value Date   CREATININE 2.83 (H) 04/08/2017       Scheduled Meds: . Chlorhexidine Gluconate Cloth  6 each Topical Daily  . dexamethasone  4 mg Intravenous Q12H  . fentaNYL   Intravenous Q4H  . heparin subcutaneous  5,000 Units Subcutaneous Q8H  . lactulose  20 g Oral TID  . sodium  chloride flush  10-40 mL Intracatheter Q12H   Continuous Infusions: . sodium chloride    . dextrose 5 % and 0.45% NaCl 100 mL/hr at 04/08/17 0756  . piperacillin-tazobactam (ZOSYN)  IV       LOS: 3 days    Time spent: >30 MINS    Reyne Dumas  [facility] Pager [phone]. If 7PM-7AM, please contact night-coverage at [email or web address], password [facility] 04/08/2017, 12:41 PM  LOS: 3 days

## 2017-04-08 NOTE — Progress Notes (Signed)
Daily Progress Note   Patient Name: Christina [name]       Date: 04/08/2017 DOB: 04/04/1961  Age: 56 y.o. 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 She does not appear to be in distress and is a little more awake today but still unable to participate in any conversations. She has been comfortable on current dose of fentanyl per her husband.  Review of PCA reveals that she is typically getting only continuous infusion of 52mcg without further utilization of bolus dosing.  We did a lot of care planning regarding plan to transition home with hospice, but family then decided to continue with aggressive hospital based interventions.  See below.  Length of Stay: 3  Current Medications: Scheduled Meds:  . Chlorhexidine Gluconate Cloth  6 each Topical Daily  . dexamethasone  4 mg Intravenous Q12H  . fentaNYL   Intravenous Q4H  . heparin subcutaneous  5,000 Units Subcutaneous Q8H  . lactulose  20 g Oral TID  . sodium chloride flush  10-40 mL Intracatheter Q12H    Continuous Infusions: . sodium chloride    . dextrose 5 % and 0.45% NaCl 100 mL/hr at 04/08/17 0756  . piperacillin-tazobactam (ZOSYN)  IV      PRN Meds: bisacodyl, diphenhydrAMINE **OR** diphenhydrAMINE, fentaNYL (SUBLIMAZE) injection, naloxone **AND** sodium chloride flush, ondansetron **OR** ondansetron (ZOFRAN) IV, sodium chloride flush  Physical Exam  Constitutional: No distress.  HENT:  Head: Normocephalic and atraumatic.  No grimacing  Eyes:  Opens eyes intermittently.   Cardiovascular:  Cool to the touch, dry.     Pulmonary/Chest: Effort normal. No respiratory distress.  Neurological:  Opens eyes intermittently.             Vital Signs: BP (!) 141/85 (BP Location: Left Arm)   Pulse 95   Temp 98.4 F (36.9 C) (Oral)   Resp 16   Ht 5\' 5"  (1.651 m)   Wt 79.8 kg (175 lb 14.8 oz)   SpO2 99%   BMI 29.28 kg/m  SpO2: SpO2: 99 % O2 Device: O2 Device: Not Delivered O2 Flow Rate:    Intake/output summary:   Intake/Output Summary (Last 24 hours) at 04/08/17 1210 Last data  filed at 04/08/17 0800  Gross per 24 hour  Intake             2400 ml  Output               40 ml  Net             2360 ml   LBM: Last BM Date: 04/07/17 Baseline Weight: Weight: 71.6 kg (157 lb 13.6 oz) Most recent weight: Weight: 79.8 kg (175 lb 14.8 oz)       Palliative Assessment/Data: 20% at best      Patient Active Problem List   Diagnosis Date Noted  . Sepsis ([place]) 04/03/2017  . Acute metabolic encephalopathy 16/05/9603  . Hyperkalemia 03/17/2017  . Acute kidney injury ([place]) 03/14/2017  . Transaminitis 04/01/2017  . Elevated bilirubin 04/08/2017  . Lower extremity edema 03/12/2017  . Hypotension 03/23/2017  . Dehydration 03/23/2017  . SIRS (systemic inflammatory response syndrome) ([place]) 03/23/2017  . Weakness 03/23/2017  . Hypercalcemia 03/23/2017  . Goals of care, counseling/discussion 07/22/2016  . Congestive dilated cardiomyopathy ([place]) 06/17/2016  . Community acquired pneumonia of left lower lobe of lung ([place]) 06/16/2016  . Acute respiratory failure with hypoxia ([place]) 06/16/2016  . Acute on chronic combined systolic and diastolic congestive heart failure ([place]) 06/16/2016  . Port catheter in place 12/04/2015  . Chemotherapy induced neutropenia ([place]) 08/07/2015  . Rectal pain 07/02/2015  . Metastatic colon cancer to liver ([place]) 04/08/2015  . Hypertension   . Thalassanemia   . Nausea & vomiting 04/06/2015  .  Pelvic mass - 9cm - ?fibroid vs drop metastasis 04/06/2015  . Hypokalemia 04/06/2015  .  Iron deficiency anemia due to chronic blood loss 04/06/2015    Palliative Care Assessment & Plan   Patient Profile: Ms. Foulks is a 56 y.o.femalewith medical history significant of metastatic colon cancer, HFrEF, HTN, iron deficiency anemia/beta thal.  Recommendations/Plan:  Her husband is no longer interested in hospice.  Original care plan was to transition home with hospice support, and I worked in conjunction with [facility] and [facility] for arrangements of PCA.  After more discussion with myself, hospice, and amongst family, her husband reports that plan of care at home with hospice is "not something I can accept."  While we had discussed care plan and goals yesterday, he reports today that he did not really understand plan would be to focus on comfort and discontinue aggressive interventions not in line with this goal.  We discussed individual interventions and his hope for each one.  He reports he would be willing to stop abx on discharge, but he will not accept discontinuation of artificial hydration on discharge.  We talked about the burdens of hydration in dying individuals.  He reports that while he understands that her care team believes she is dying, "I do not accept that" and he remains hopeful that she will receive God's healing.    Her care team has attempted to address code status and goals multiple times.  While plan was going to be home with hospice, her husband is now wanting to continue with aggressive, hospital based interventions.  He is adamant that she remain a full code as he reports that she has always stated this is her wish.  I discussed with Dr. Allyson Sabal that plan is now for continued aggressive care.  I called and let pharmacy know that plan is to restart and continue antibiotics.  Her overall prognosis remains grim, and I believe this is likely to  be a terminal admission.   Code Status:    Code Status Orders        Start     Ordered   03/22/2017 1809  Full  code  Continuous     03/14/2017 1809    Code Status History    Date Active Date Inactive Code Status Order ID Comments User Context   03/23/2017  5:56 PM 03/27/2017 12:18 PM Full Code [id number]  Ladene Artist., MD Inpatient   06/16/2016 11:06 PM 06/20/2016  5:52 PM Full Code [id number]  Rise Patience, MD ED   04/08/2015  6:59 PM 04/11/2015  1:37 PM Full Code [id number]  Michael Boston, MD Inpatient   04/06/2015  1:41 PM 04/08/2015  6:59 PM Full Code [id number]  Rai, Vernelle Emerald, MD Inpatient       Prognosis:   Hours - Days if she continue along current trajectory.    Discharge Planning:  Husband is now stating that he does not want to transition home with hospice support.  He wants to continue with full aggressive care.  Her prognosis at this point is grim, and I believe that it is likely this will be a terminal admission  Care plan was discussed with Dr. Allyson Sabal, [facility] liaison, bedside RN  Thank you for allowing the Palliative Medicine Team to assist in the care of this patient.   Time In: 0930 Time Out: 1050 Total Time 80 Prolonged Time Billed Yes      Greater than 50%  of this time was spent counseling and coordinating care related to the above assessment and plan.  Micheline Rough, MD  Please contact Palliative Medicine Team phone at [phone] for questions and concerns.   Micheline Rough, MD [place] Team [phone]

## 2017-04-08 NOTE — Progress Notes (Signed)
Family endorsed that patient is unable to push the PCA pump and they have been pushing the button for her. Patient is currently non verbal, and unable to follow commands. Family said they have pushed the PCA 3 times since it was set-up. Family educated that the pump is meant to be pushed by patient alone, that it is a patient controlled device. Pump turn off due to patients mental status, MD notified.

## 2017-04-09 MED ORDER — FENTANYL CITRATE (PF) 100 MCG/2ML IJ SOLN
25.0000 ug | INTRAMUSCULAR | Status: DC | PRN
  Administered 2017-04-09 (×2): 25 ug via INTRAVENOUS
  Filled 2017-04-09 (×2): qty 2

## 2017-04-09 MED ORDER — ORAL CARE MOUTH RINSE
15.0000 mL | Freq: Two times a day (BID) | OROMUCOSAL | Status: DC
Start: 2017-04-09 — End: 2017-04-15
  Administered 2017-04-09 – 2017-04-14 (×6): 15 mL via OROMUCOSAL

## 2017-04-09 MED ORDER — FENTANYL CITRATE (PF) 2500 MCG/50ML IJ SOLN
30.0000 ug/h | INTRAMUSCULAR | Status: DC
  Administered 2017-04-09: 15 ug/h via INTRAVENOUS
  Administered 2017-04-11 – 2017-04-15 (×3): 30 ug/h via INTRAVENOUS
  Filled 2017-04-09 (×3): qty 50

## 2017-04-09 MED ORDER — FENTANYL BOLUS VIA INFUSION
20.0000 ug | INTRAVENOUS | Status: DC | PRN
  Administered 2017-04-09 – 2017-04-14 (×33): 20 ug via INTRAVENOUS
  Filled 2017-04-09: qty 20

## 2017-04-09 NOTE — Progress Notes (Signed)
Patient ID: Christina [name], female   DOB: 1961/05/17, 56 y.o.   MRN: [id number]  PROGRESS NOTE    Christina [name]  BMW:[id number] DOB: Sep 07, 1960 DOA: 04/03/2017  PCP: Benito Mccreedy, MD   Brief Narrative:  56 year old female with metastatic colon cancer, thalassemia trait, systolic CHF with EF 27-25%, last received chemo in 02/2017, hospitalized 08/15 - 808/19 for dehydration, AKI, hypercalcemia and weakness who presented to ED due to ongoing abdominal pain despite taking home analgesics. Pt has significantly declined since the admission. PCT consulted for [place]. Pt husband adamant to continue aggressive care. Due to severe pain not controlled with PCA, pt placed on fentanyl drip for pain control.   Assessment & Plan:   Sepsis secondary to UTI versus intra-abdominal source - Pt on empiric vanco and zosyn - Stopped vanco today as pt declined blood work and we cannot check vanco level - Blood and urine cx showed no growth so far  Hyperkalemia due to [facility] possibly tissue necrosis from large mets - Sodium bicarb amps given - Pt decline blood work today - Unable to check today's potassium level  AKI with metabolic acidosis - Due to dehydration - As noted above, pt declined blood work until Monday   Metastatic colon cancer - Poor prognosis - Pt family adamant about full code and aggressive care however pt is in tremendous pain, too lethargic to use PCA but moaning in pain - Family requested fentanyl drip which at this point may further cause pt to decline in regards to her resp status - Unfortunately we have not reached an agreement with family about code status but even with resuscitation her chances of meaningful survival are slim  Systolic congestive heart failure, EF 35-40% - Monitor daily - Pt declined blood work  Acute metabolic encephalopathy - Due to metastatic liver disease - Some alert but does not say much, uncomfortable due to pain   Transaminitis /  Elevated bilirubin - Due to metastatic lesions in the liver  Lower extremity edema - Likely third spacing from malignancy, ascites, CHD   DVT prophylaxis: Heparin suBQ Code Status: full code  Family Communication: husband and her sister at the bedside this am Disposition Plan: remains in SDU, not yet stable for d/c   Consultants:   Oncology  PCT  Procedures:   None   Antimicrobials:   Zosyn -->  Vanco held today as pt refused blood draw and we cannot check vanco level   Subjective: No overnight events.  Objective: Vitals:   04/09/17 0750 04/09/17 0800 04/09/17 1000 04/09/17 1130  BP:  (!) 129/94 (!) 130/106   Pulse:   (!) 103   Resp: 16 16 (!) 26   Temp: (!) 96.7 F (35.9 C)   (!) 96.7 F (35.9 C)  TempSrc: Axillary   Axillary  SpO2: 99%  99%   Weight:      Height:        Intake/Output Summary (Last 24 hours) at 04/09/17 1224 Last data filed at 04/09/17 0500  Gross per 24 hour  Intake             1800 ml  Output                0 ml  Net             1800 ml   Filed Weights   04/06/17 0344 04/08/17 0500 04/09/17 0500  Weight: 79.2 kg (174 lb 9.7 oz) 79.8 kg (175 lb 14.8 oz) 79.8 kg (175  lb 14.8 oz)    Examination:  General exam: Appears uncomfortable due to pain Respiratory system: Clear to auscultation. Respiratory effort normal. Cardiovascular system: S1 & S2 heard, RRR Gastrointestinal system: (+) BS, had abd wall masses which protrude and are palpable and visible on PE Central nervous system: Alert but does not say much, responds by smile or following with eyes Extremities: +2 LE pitting edema Skin: No rashes, lesions or ulcers Psychiatry: Not restless or agitated   Data Reviewed: I have personally reviewed following labs and imaging studies  CBC:  Recent Labs Lab 03/22/2017 1311 04/06/17 0341 04/07/17 0614 04/08/17 0335  WBC 25.5* 27.8* 29.0* 28.2*  NEUTROABS 22.9*  --   --   --   HGB 11.3* 10.9* 10.9* 10.7*  HCT 36.3 34.6* 35.0*  34.1*  MCV 78.6 79.5 79.0 80.4  PLT 476* 392 450* 008   Basic Metabolic Panel:  Recent Labs Lab 03/09/2017 1311 03/12/2017 1809 04/06/17 0341 04/07/17 0614 04/08/17 0335  NA 146* 145 144 145 143  K 6.5* 5.3* 5.5* 5.5* 5.5*  CL 115* 117* 114* 116* 114*  CO2 15* 15* 15* 14* 13*  GLUCOSE 94 138* 160* 150* 138*  BUN 87* 83* 82* 94* 96*  CREATININE 2.42* 2.17* 2.27* 2.68* 2.83*  CALCIUM 9.3 8.3* 8.8* 8.9 8.7*   GFR: Estimated Creatinine Clearance: 23.2 mL/min (A) (by C-G formula based on SCr of 2.83 mg/dL (H)). Liver Function Tests:  Recent Labs Lab 03/13/2017 1311 04/06/17 0341 04/07/17 0614 04/08/17 0335  AST 353* 406* 479* 456*  ALT 95* 105* 130* 130*  ALKPHOS 482* 428* 508* 522*  BILITOT 4.6* 4.2* 5.2* 5.2*  PROT 6.6 6.0* 6.0* 5.9*  ALBUMIN 2.2* 1.9* 1.9* 1.8*   No results for input(s): LIPASE, AMYLASE in the last 168 hours.  Recent Labs Lab 03/11/2017 1311 04/06/17 1018  AMMONIA 56* 66*   Coagulation Profile:  Recent Labs Lab 03/31/2017 1627 04/06/17 0341  INR 3.93 2.21   Cardiac Enzymes: No results for input(s): CKTOTAL, CKMB, CKMBINDEX, TROPONINI in the last 168 hours. BNP (last 3 results) No results for input(s): PROBNP in the last 8760 hours. HbA1C: No results for input(s): HGBA1C in the last 72 hours. CBG:  Recent Labs Lab 04/07/17 0104  GLUCAP 101*   Lipid Profile: No results for input(s): CHOL, HDL, LDLCALC, TRIG, CHOLHDL, LDLDIRECT in the last 72 hours. Thyroid Function Tests: No results for input(s): TSH, T4TOTAL, FREET4, T3FREE, THYROIDAB in the last 72 hours. Anemia Panel: No results for input(s): VITAMINB12, FOLATE, FERRITIN, TIBC, IRON, RETICCTPCT in the last 72 hours. Urine analysis:    Component Value Date/Time   COLORURINE BROWN (A) 03/24/2017 1207   APPEARANCEUR HAZY (A) 04/06/2017 1207   LABSPEC >1.030 (H) 03/26/2017 1207   PHURINE 6.5 03/15/2017 1207   GLUCOSEU NEGATIVE 03/27/2017 1207   HGBUR LARGE (A) 04/02/2017 1207    BILIRUBINUR MODERATE (A) 03/13/2017 1207   KETONESUR TRACE (A) 03/21/2017 1207   PROTEINUR 100 (A) 04/04/2017 1207   NITRITE POSITIVE (A) 04/02/2017 1207   LEUKOCYTESUR TRACE (A) 04/06/2017 1207   Sepsis Labs: @LABRCNTIP (procalcitonin:4,lacticidven:4)   Recent Results (from the past 240 hour(s))  Urine culture     Status: None   Collection Time: 04/08/2017 12:07 PM  Result Value Ref Range Status   Specimen Description URINE, CATHETERIZED  Final   Special Requests NONE  Final   Culture   Final    NO GROWTH Performed at [facility], [place] [address], [place], [place] [address]  Report Status 04/06/2017 FINAL  Final  Culture, blood (Routine X 2) w Reflex to ID Panel     Status: None (Preliminary result)   Collection Time: 03/28/2017  5:55 PM  Result Value Ref Range Status   Specimen Description BLOOD LEFT ANTECUBITAL  Final   Special Requests IN PEDIATRIC BOTTLE Blood Culture adequate volume  Final   Culture   Final    NO GROWTH 4 DAYS Performed at [facility], [place] [address] [place], [place] [address]    Report Status PENDING  Incomplete  MRSA PCR Screening     Status: None   Collection Time: 03/22/2017  6:13 PM  Result Value Ref Range Status   MRSA by PCR NEGATIVE NEGATIVE Final    Comment:        The GeneXpert MRSA Assay (FDA approved for NASAL specimens only), is one component of a comprehensive MRSA colonization surveillance program. It is not intended to diagnose MRSA infection nor to guide or monitor treatment for MRSA infections.   Culture, blood (Routine X 2) w Reflex to ID Panel     Status: None (Preliminary result)   Collection Time: 03/14/2017  6:25 PM  Result Value Ref Range Status   Specimen Description BLOOD LEFT ARM  Final   Special Requests IN PEDIATRIC BOTTLE Blood Culture adequate volume  Final   Culture   Final    NO GROWTH 4 DAYS Performed at [facility], [place] [address], [place], [place] [address]    Report Status PENDING   Incomplete      Radiology Studies: Ct Abdomen Wo Contrast  Result Date: 03/20/2017 CLINICAL DATA:  Advanced metastatic colon cancer with dehydration and likely hepatic encephalopathy. EXAM: CT ABDOMEN WITHOUT CONTRAST TECHNIQUE: Multidetector CT imaging of the abdomen was performed following the standard protocol without IV contrast. COMPARISON:  02/21/2017 FINDINGS: Assessment of solid and hollow visceral organ pathology is limited by lack of IV and oral contrast. Lower chest: There has been interval clearing of small right pleural effusion with some residual subpleural consolidations likely representing atelectasis noted at the right lung base. There is cardiomegaly without pericardial effusion. No pneumonic consolidations. The lingular and left lower lobe pulmonary nodules noted on recent comparison study are not visualized and are likely not included as part of this study. Hepatobiliary: In homogeneous attenuation of the liver consistent with known hepatic metastasis with coarse intraparenchymal densities occupying much of the medial and portions of the lateral segment of the left hepatic lobe. No biliary dilatation is identified. Pancreas: Unremarkable. No pancreatic ductal dilatation or surrounding inflammatory changes. Spleen: Normal in size without focal abnormality. Adrenals/Urinary Tract: No adrenal masses. Renovascular calcifications noted of the kidneys, right greater than left. No hydroureteronephrosis. Stomach/Bowel: Contracted stomach.  No bowel obstruction. Vascular/Lymphatic: Right-sided metastatic juxtacardiac lymphadenopathy is slightly larger currently estimated at 1.4 x 1.8 cm versus 1.4 x 1.6 cm previously. Bulky gastrohepatic ligament lymphadenopathy is stable estimated at 3.7 x 2.5 cm versus 3.9 x 2.5 cm previously. Minimal atherosclerosis of the abdominal aorta without aneurysm. Other: Interval slight increase in small to moderate volume of ascites. Interval increase in soft tissue  metastatic masses along the anterior abdominal wall, the largest is approximately 6.1 x 4.4 cm versus 5.1 x 4.8 cm. Metastatic peritoneal implants are again noted Musculoskeletal: No aggressive lytic or blastic osseous lesions. No acute fracture. IMPRESSION: 1. Interval clearing of right small pleural effusion with residual atelectasis seen at the right lung base. Previously noted lingular and left lower  lobe nodules are not visualized, likely excluded on this study. 2. Some interval increase in size of of juxtacardiac and gastrohepatic lymphadenopathy as well as metastatic subcutaneous anterior abdominal wall masses. 3. Some interval increase in ascites more so in the right hemiabdomen. 4. Hepatomegaly with ill-defined hypodense and hyperdense metastatic lesions scattered throughout the liver, less well characterized due to lack of IV contrast. Electronically Signed   By: Ashley Royalty M.D.   On: 04/03/2017 16:03   Ct Head Wo Contrast  Result Date: 03/25/2017 CLINICAL DATA:  Altered mental status. History of abdominal cancer. Patient unable to communicate today. EXAM: CT HEAD WITHOUT CONTRAST TECHNIQUE: Contiguous axial images were obtained from the base of the skull through the vertex without intravenous contrast. COMPARISON:  None. FINDINGS: Brain: No evidence of acute infarction, hemorrhage, hydrocephalus, extra-axial collection or mass lesion/mass effect. There are vague areas of white matter hypoattenuation most likely due to mild chronic microvascular ischemic change. Vascular: No hyperdense vessel or unexpected calcification. Skull: Normal. Negative for fracture or focal lesion. Sinuses/Orbits: Globes and orbits are unremarkable. Visualized sinuses and mastoid air cells are clear. Other: None. IMPRESSION: 1. No acute intracranial abnormalities. Electronically Signed   By: Lajean Manes M.D.   On: 04/06/2017 17:26      Scheduled Meds: . dexamethasone  4 mg Intravenous Q12H  . heparin subQ  5,000  Units Subcutaneous Q8H  . lactulose  20 g Oral TID   Continuous Infusions: . sodium chloride    . dextrose 5 % and 0.45% NaCl 100 mL/hr at 04/09/17 0758  . fentaNYL infusion INTRAVENOUS 15 mcg/hr (04/09/17 1058)  . piperacillin-tazobactam (ZOSYN)  IV Stopped (04/09/17 0718)     LOS: 4 days    Time spent: 25 minutes  Greater than 50% of the time spent on counseling and coordinating the care.   Leisa Lenz, MD [facility] Pager [phone]  If 7PM-7AM, please contact night-coverage [email or web address] Password [facility] 04/09/2017, 12:24 PM

## 2017-04-09 NOTE — Progress Notes (Signed)
Patient family declined lab draw and Heparin SQ injection. Family states that patient has had enough blood draws, they request that the next lab draw be on Monday.

## 2017-04-09 NOTE — Progress Notes (Signed)
Daily Progress Note   Patient Name: Christina [name]       Date: 04/09/2017 DOB: [birth year]  Age: 56 y.o. MRN#: [id number] Attending Physician: Robbie Lis, MD Primary Care Physician: Benito Mccreedy, MD Admit Date: 03/28/2017  Reason for Consultation/Follow-up: Establishing goals of care  Subjective: Christina [name] is lying in bed. She does not appear to be in distress at this time, but noted to be in pain earlier today by her husband and staff.  We discussed use of PCA and how she was more comfortable with low dose basal rate, and her husband reports he was hopeful that this could be restarted.  He reports that she has had a few bites of applesauce, and he interprets this as a sign that she is improving.  Attempted to broach again concern about her overall trajectory, but her husband remains clear in belief that God is going to heal her and desire to continue aggressive therapies.  Length of Stay: 4  Current Medications: Scheduled Meds:  . Chlorhexidine Gluconate Cloth  6 each Topical Daily  . dexamethasone  4 mg Intravenous Q12H  . heparin subcutaneous  5,000 Units Subcutaneous Q8H  . lactulose  20 g Oral TID  . mouth rinse  15 mL Mouth Rinse BID  . sodium chloride flush  10-40 mL Intracatheter Q12H    Continuous Infusions: . sodium chloride    . dextrose 5 % and 0.45% NaCl 100 mL/hr at 04/09/17 0758  . fentaNYL infusion INTRAVENOUS 15 mcg/hr (04/09/17 1058)  . piperacillin-tazobactam (ZOSYN)  IV Stopped (04/09/17 0718)    PRN Meds: bisacodyl, fentaNYL, fentaNYL (SUBLIMAZE) injection, ondansetron **OR** ondansetron (ZOFRAN) IV, sodium chloride flush  Physical Exam      General: Intermittently opens eyes.  Restless. Heart: Tachycardic Lungs: Fair air movement,  clear Abdomen: Soft, mildly tender globally, positive bowel sounds.  Ext: No significant edema Skin: Warm and dry     Vital Signs: BP (!) 130/106   Pulse (!) 103   Temp (!) 96.7 F (35.9 C) (Axillary)   Resp (!) 26   Ht 5\' 5"  (1.651 m)   Wt 79.8 kg (175 lb 14.8 oz)   SpO2 99%   BMI 29.28 kg/m  SpO2: SpO2: 99 % O2 Device: O2 Device: Not Delivered O2 Flow Rate:    Intake/output summary:  Intake/Output Summary (Last 24 hours) at 04/09/17 1136 Last data filed at 04/09/17 0500  Gross per 24 hour  Intake             2200 ml  Output                0 ml  Net             2200 ml   LBM: Last BM Date: 04/09/17 Baseline Weight: Weight: 71.6 kg (157 lb 13.6 oz) Most recent weight: Weight: 79.8 kg (175 lb 14.8 oz)       Palliative Assessment/Data: 20% at best      Patient Active Problem List   Diagnosis Date Noted  . Sepsis ([place]) 03/16/2017  . Acute metabolic encephalopathy 03/00/9233  . Hyperkalemia 03/20/2017  . Acute kidney injury ([place]) 04/02/2017  . Transaminitis 03/13/2017  . Elevated bilirubin 04/04/2017  . Lower extremity edema 03/30/2017  . Hypotension 03/23/2017  . Dehydration 03/23/2017  . SIRS (systemic inflammatory response syndrome) ([place]) 03/23/2017  . Weakness 03/23/2017  . Hypercalcemia 03/23/2017  . Goals of care, counseling/discussion 07/22/2016  . Congestive dilated cardiomyopathy ([place]) 06/17/2016  . Community acquired pneumonia of left lower lobe of lung ([place]) 06/16/2016  . Acute respiratory failure with hypoxia ([place]) 06/16/2016  . Acute on chronic combined systolic and diastolic congestive heart failure ([place]) 06/16/2016  . Port catheter in place 12/04/2015  . Chemotherapy induced neutropenia ([place]) 08/07/2015  . Rectal pain 07/02/2015  . Metastatic colon cancer to liver ([place]) 04/08/2015  . Hypertension   . Thalassanemia   . Nausea & vomiting 04/06/2015  .  Pelvic mass - 9cm - ?fibroid vs drop metastasis 04/06/2015  . Hypokalemia 04/06/2015  .  Iron deficiency anemia due to chronic blood loss 04/06/2015    Palliative Care Assessment & Plan   Patient Profile: Christina [name] is a 56 y.o.femalewith medical history significant of metastatic colon cancer, HFrEF, HTN, iron deficiency anemia/beta thal.  Recommendations/Plan:  Have attempted to address code status and goals multiple times.  While plan was going to be home with hospice, her husband is now wanting to continue with aggressive, hospital based interventions.  He is adamant that she remain a full code as he reports that she has always stated this is her wish.  He remains hopeful that she will receive God's healing.    PCA was discontinued due to concern of PCA by proxy from family.  She was much more comfortable on continuous infusion, and her husband would like to restart low dose continuous fentanyl.  D/c PCA and use clinician boluses for breakthrough pain.  Plan for fentanyl 68mcg/hour with additional 48mcg bolus to be given every 30 minutes as needed for breakthrough pain.   Code Status:    Code Status Orders        Start     Ordered   03/18/2017 1809  Full code  Continuous     03/21/2017 1809    Code Status History    Date Active Date Inactive Code Status Order ID Comments User Context   03/23/2017  5:56 PM 03/27/2017 12:18 PM Full Code [id number]  Ladene Artist., MD Inpatient   06/16/2016 11:06 PM 06/20/2016  5:52 PM Full Code [id number]  Rise Patience, MD ED   04/08/2015  6:59 PM 04/11/2015  1:37 PM Full Code [id number]  Michael Boston, MD Inpatient   04/06/2015  1:41 PM 04/08/2015  6:59 PM Full Code [id number]  Mendel Corning, MD Inpatient  Prognosis:   guarded  Discharge Planning:  Family wants to continue with full aggressive care.  I am still concerned this will likely be a terminal admission  Care plan was discussed with husband and bedside RN  Thank you for allowing the Palliative Medicine Team to assist in the care of this patient.   Time  In: 1010 Time Out: 1050 Total Time 40 Prolonged Time Billed No      Greater than 50%  of this time was spent counseling and coordinating care related to the above assessment and plan.  Micheline Rough, MD  Please contact Palliative Medicine Team phone at [phone] for questions and concerns.   Micheline Rough, MD [facility] Team [phone]

## 2017-04-09 NOTE — Progress Notes (Signed)
10 mg fentynal PCA wasted with Wandra Scot RN

## 2017-04-09 NOTE — Progress Notes (Signed)
Patient's Port-a-cath became dislodged by accident, order placed for IV consult for re acces of the port. Site dry and clean, no bleeding noted, needle came out intact.

## 2017-04-09 DEATH — deceased

## 2017-04-10 LAB — CULTURE, BLOOD (ROUTINE X 2)
CULTURE: NO GROWTH
CULTURE: NO GROWTH
SPECIAL REQUESTS: ADEQUATE
Special Requests: ADEQUATE

## 2017-04-10 NOTE — Progress Notes (Signed)
Daily Progress Note   Patient Name: Christina [name]       Date: 04/10/2017 DOB: Jan 02, 1961  Age: 56 y.o. 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 She does not appear to be in distress at this time, but her breathing has changed and she is less responsive.  Attempted to broach again that we are approaching end-of-life, but her husband remains clear in belief that God is going to heal her and desire to continue aggressive therapies.  Length of Stay: 5  Current Medications: Scheduled Meds:  . Chlorhexidine Gluconate Cloth  6 each Topical Daily  . dexamethasone  4 mg Intravenous Q12H  . heparin subcutaneous  5,000 Units Subcutaneous Q8H  . lactulose  20 g Oral TID  . mouth rinse  15 mL Mouth Rinse BID  . sodium chloride flush  10-40 mL Intracatheter Q12H    Continuous Infusions: . sodium chloride    . dextrose 5 % and 0.45% NaCl 100 mL (04/10/17 0439)  . fentaNYL infusion INTRAVENOUS 30 mcg/hr (04/09/17 1831)  . piperacillin-tazobactam (ZOSYN)  IV Stopped (04/10/17 0725)    PRN Meds: bisacodyl, fentaNYL, fentaNYL (SUBLIMAZE) injection, ondansetron **OR** ondansetron (ZOFRAN) IV, sodium chloride flush  Physical Exam      General: Intermittently opens eyes.  Restless. Heart: Tachycardic Lungs: Fair air movement, some brief pauses noted Abdomen: Soft, mildly tender globally, positive bowel sounds.  Ext: No significant edema Skin: Warm and dry     Vital Signs: BP 122/86   Pulse 81   Temp (!) 97.5 F (36.4 C) (Oral)   Resp 18   Ht 5\' 5"  (1.651 m)   Wt 79.8 kg (175 lb 14.8 oz)   SpO2  100%   BMI 29.28 kg/m  SpO2: SpO2: 100 % O2 Device: O2 Device: Not Delivered O2 Flow Rate:    Intake/output summary:   Intake/Output Summary (Last 24 hours) at 04/10/17 1148 Last data filed at 04/10/17 0655  Gross per 24 hour  Intake          2730.53 ml  Output                0 ml  Net  2730.53 ml   LBM: Last BM Date: 04/09/17 Baseline Weight: Weight: 71.6 kg (157 lb 13.6 oz) Most recent weight: Weight: 79.8 kg (175 lb 14.8 oz)       Palliative Assessment/Data: 20% at best      Patient Active Problem List   Diagnosis Date Noted  . Sepsis ([place]) 03/30/2017  . Acute metabolic encephalopathy 25/42/7062  . Hyperkalemia 03/19/2017  . Acute kidney injury ([place]) 03/14/2017  . Transaminitis 03/20/2017  . Elevated bilirubin 03/17/2017  . Lower extremity edema 04/02/2017  . Hypotension 03/23/2017  . Dehydration 03/23/2017  . SIRS (systemic inflammatory response syndrome) ([place]) 03/23/2017  . Weakness 03/23/2017  . Hypercalcemia 03/23/2017  . Goals of care, counseling/discussion 07/22/2016  . Congestive dilated cardiomyopathy ([place]) 06/17/2016  . Community acquired pneumonia of left lower lobe of lung ([place]) 06/16/2016  . Acute respiratory failure with hypoxia ([place]) 06/16/2016  . Acute on chronic combined systolic and diastolic congestive heart failure ([place]) 06/16/2016  . Port catheter in place 12/04/2015  . Chemotherapy induced neutropenia ([place]) 08/07/2015  . Rectal pain 07/02/2015  . Metastatic colon cancer to liver ([place]) 04/08/2015  . Hypertension   . Thalassanemia   . Nausea & vomiting 04/06/2015  .  Pelvic mass - 9cm - ?fibroid vs drop metastasis 04/06/2015  . Hypokalemia 04/06/2015  . Iron deficiency anemia due to chronic blood loss 04/06/2015    Palliative Care Assessment & Plan   Patient Profile: Christina [name] is a 56 y.o.femalewith medical history significant of metastatic colon cancer, HFrEF, HTN, iron deficiency anemia/beta  thal.  Recommendations/Plan:  Have attempted to address code status and goals multiple times.  While plan at one point was home with hospice, her husband continues to desire aggressive, hospital based interventions.  He is adamant that she remain a full code as he reports that she has always stated this is her wish.  He remains hopeful that she will receive God's healing.  My medical recommendation to family remains that, if Dr. Hilbert Bible has an arrest, a resuscitation attempt would be an inadvisable/inappropriate medical intervention in this irreversible situation which has a very poor prognosis.  Unfortunately, her husband is fixed in his decision and has been clear that he will not discuss further.  Continuous fentanyl was adjusted last night as she had been getting multiple boluses.  Currently on fentanyl 70mcg/hour with additional 20mcg bolus to be given every 30 minutes as needed for breakthrough pain.  While she remains a  FULL CODE, I would continue to give medications as needed for her pain as she appears to be actively dying and appropriately treating her suffering and pain will not impact this fact.   Code Status:    Code Status Orders        Start     Ordered   03/26/2017 1809  Full code  Continuous     03/31/2017 1809    Code Status History    Date Active Date Inactive Code Status Order ID Comments User Context   03/23/2017  5:56 PM 03/27/2017 12:18 PM Full Code [id number]  Ladene Artist., MD Inpatient   06/16/2016 11:06 PM 06/20/2016  5:52 PM Full Code [id number]  Rise Patience, MD ED   04/08/2015  6:59 PM 04/11/2015  1:37 PM Full Code [id number]  Michael Boston, MD Inpatient   04/06/2015  1:41 PM 04/08/2015  6:59 PM Full Code [id number]  Mendel Corning, MD Inpatient       Prognosis:  Hours to days  Discharge  Planning:  Husband wants to continue with full aggressive care.  I believe this will be a terminal admission  Care plan was discussed with husband and bedside  RN  Thank you for allowing the Palliative Medicine Team to assist in the care of this patient.   Total Time 30 Prolonged Time Billed No      Greater than 50%  of this time was spent counseling and coordinating care related to the above assessment and plan.  Micheline Rough, MD  Please contact Palliative Medicine Team phone at [phone] for questions and concerns.   Micheline Rough, MD [place] Team [phone]

## 2017-04-10 NOTE — Progress Notes (Addendum)
PULMONARY / CRITICAL CARE MEDICINE   Name: Christina [name] MRN: [id number] DOB: August 09, 1961    ADMISSION DATE:  03/23/2017 CONSULTATION DATE:  04/10/17  REFERRING MD:  Neil Crouch MD  CHIEF COMPLAINT:  Respiratory distress  HISTORY OF PRESENT ILLNESS:   56 year old with metastatic colon cancer, systolic heart failure. Admitted for dehydration and GI weakness, abdominal pain She has continued to decline during this admission. Multiple discussions with palliative care and family and she remains full code. Oncologist is recommending hospice. PCCM consulted for help with code status discussion.  PAST MEDICAL HISTORY :  She  has a past medical history of Endometriosis; Hypertension; met colon ca to liver (dx'd 03/2015); and Thalassanemia.  PAST SURGICAL HISTORY: She  has a past surgical history that includes Myomectomy; Diagnostic laparoscopy; Colon resection (N/A, 04/08/2015); Liver biopsy (N/A, 04/08/2015); Portacath placement (N/A, 04/08/2015); and Laparoscopic sigmoid colectomy (04/08/2015).  Allergies  Allergen Reactions  . Lactose Intolerance (Gi) Other (See Comments)    Diarrhea, Bloated, Abdominal pain.  Marland Kitchen Morphine And Related Anaphylaxis    Sedation  . Dilaudid [Hydromorphone Hcl]     Severe cramping   . Lasix [Furosemide] Nausea And Vomiting    Problems with IV only  . Lorazepam Other (See Comments)    Pt couldn't walk straight, put her in a daze    Current Facility-Administered Medications on File Prior to Encounter  Medication  . heparin lock flush 100 unit/mL  . sodium chloride flush (NS) 0.9 % injection 10 mL   Current Outpatient Prescriptions on File Prior to Encounter  Medication Sig  . carvedilol (COREG) 6.25 MG tablet Take 3.125 mg by mouth daily.   Marland Kitchen dexamethasone (DECADRON) 4 MG tablet Take 1 tablet (4 mg total) by mouth daily.  Marland Kitchen lidocaine-prilocaine (EMLA) cream Apply to port site one hour prior to use. Do not rub in. Cover with plastic.  Marland Kitchen ondansetron (ZOFRAN) 4  MG tablet Take 1 tablet (4 mg total) by mouth every 8 (eight) hours as needed for nausea or vomiting.  . Oxycodone HCl 10 MG TABS Take 1-2 tablets (10-20 mg total) by mouth every 4 (four) hours as needed.  . magic mouthwash SOLN Equal parts Nystatin, Diphenhydramine and Maalax four times a day as needed, 5-10 ml swish and spit (Patient not taking: Reported on 03/23/2017)  . prochlorperazine (COMPAZINE) 10 MG tablet Take 1 tablet (10 mg total) by mouth every 6 (six) hours as needed for nausea or vomiting. (Patient not taking: Reported on 03/23/2017)    FAMILY HISTORY:  Her indicated that the status of her maternal grandmother is unknown. She indicated that the status of her cousin is unknown.    SOCIAL HISTORY: She  reports that she has never smoked. She has never used smokeless tobacco. She reports that she drinks alcohol. She reports that she does not use drugs.  REVIEW OF SYSTEMS:   Unable to obtain due to altered mental status  SUBJECTIVE:    VITAL SIGNS: BP 104/76   Pulse 72   Temp (!) 97.4 F (36.3 C) (Oral)   Resp 14   Ht 5' 5"  (1.651 m)   Wt 175 lb 14.8 oz (79.8 kg)   SpO2 100%   BMI 29.28 kg/m   HEMODYNAMICS:    VENTILATOR SETTINGS:    INTAKE / OUTPUT: I/O last 3 completed shifts: In: 3780.5 [I.V.:3580.5; IV Piggyback:200] Out: -   PHYSICAL EXAMINATION: Blood pressure 104/76, pulse 72, temperature (!) 97.4 F (36.3 C), temperature source Oral, resp. rate 14, height 5'  5" (1.651 m), weight 175 lb 14.8 oz (79.8 kg), SpO2 100 %. Gen:      Chronically ill, agonal HEENT:  EOMI, sclera anicteric Neck:     No masses; no thyromegaly Lungs:    Clear to auscultation bilaterally; normal respiratory effort CV:         Regular rate and rhythm; no murmurs Abd:      + bowel sounds; soft, non-tender; no palpable masses, no distension Ext:    No edema; adequate peripheral perfusion Skin:      Warm and dry; no rash Neuro: Non responsive  LABS:  BMET  Recent Labs Lab  04/06/17 0341 04/07/17 0614 04/08/17 0335  NA 144 145 143  K 5.5* 5.5* 5.5*  CL 114* 116* 114*  CO2 15* 14* 13*  BUN 82* 94* 96*  CREATININE 2.27* 2.68* 2.83*  GLUCOSE 160* 150* 138*    Electrolytes  Recent Labs Lab 04/06/17 0341 04/07/17 0614 04/08/17 0335  CALCIUM 8.8* 8.9 8.7*    CBC  Recent Labs Lab 04/06/17 0341 04/07/17 0614 04/08/17 0335  WBC 27.8* 29.0* 28.2*  HGB 10.9* 10.9* 10.7*  HCT 34.6* 35.0* 34.1*  PLT 392 450* 358    Coag's  Recent Labs Lab 03/31/2017 1627 04/06/17 0341  APTT 42*  --   INR 3.93 2.21    Sepsis Markers  Recent Labs Lab 04/08/2017 1501 03/23/2017 1822  LATICACIDVEN 3.8* 3.7*    ABG No results for input(s): PHART, PCO2ART, PO2ART in the last 168 hours.  Liver Enzymes  Recent Labs Lab 04/06/17 0341 04/07/17 0614 04/08/17 0335  AST 406* 479* 456*  ALT 105* 130* 130*  ALKPHOS 428* 508* 522*  BILITOT 4.2* 5.2* 5.2*  ALBUMIN 1.9* 1.9* 1.8*    Cardiac Enzymes No results for input(s): TROPONINI, PROBNP in the last 168 hours.  Glucose  Recent Labs Lab 04/07/17 0104  GLUCAP 101*    Imaging No results found.  DISCUSSION: 56 year old with metastatic colon cancer She is actively dying and appears agonal  I had a long discussion with the husband and sisters. I told them if she remains full code she'll need to be intubated today and she'll die on the ventilator and in pain. I am willing to do it but putting on life-support will not prolong her life anymore.  After further discussion with the extended family they have decided to transition her to full DNR. The husband wants to make sure that we do not withdraw care and we will continue to treat her as we're doing now.  PCCM will be available as needed. Please call with any questions.  The patient is critically ill with multiple organ system failure and requires high complexity decision making for assessment and support, frequent evaluation and titration of  therapies, advanced monitoring, review of radiographic studies and interpretation of complex data.   Critical Care Time devoted to patient care services, exclusive of separately billable procedures, described in this note is 35 minutes.   Marshell Garfinkel MD [facility] Pager [phone] If no answer or after 3pm call: [phone] 04/10/2017, 2:20 PM

## 2017-04-10 NOTE — Progress Notes (Signed)
Pt bladder scan revealed >525cc. In and out cath attempted. Blood in catheter but no urine out. MD aware. No new orders at this time. Will continue to monitor. Pt family continues to refuse lab draws.

## 2017-04-10 NOTE — Progress Notes (Signed)
Pt with episode of coffee ground emesis. MD at bedside made aware. Zofran given. No new orders at this time. Will continue to monitor.

## 2017-04-10 NOTE — Progress Notes (Signed)
Patient ID: Christina [name], female   DOB: May 21, 1961, 56 y.o.   MRN: [id number]  PROGRESS NOTE    Christina [name]  VXB:[id number] DOB: [birth year] DOA: 03/18/2017  PCP: Benito Mccreedy, MD   Brief Narrative:  56 year old female with metastatic colon cancer, thalassemia trait, systolic CHF with EF 33-00%, last received chemo in 02/2017, hospitalized 08/15 - 808/19 for dehydration, AKI, hypercalcemia and weakness who presented to ED due to ongoing abdominal pain despite taking home analgesics. Pt has significantly declined since the admission. PCT consulted for [place]. Pt husband adamant to continue aggressive care. Due to severe pain not controlled with PCA, pt placed on fentanyl drip for pain control.  Unfortunately this is a difficult situation, family adamant about continuing full code, aggressive care, husband is trying to feed her. We spoke with pt husband as pt threw up after applesauce. PCT is following. At this time, pt prognosis is poor, she has agonal breathing. CCM consulted for help with addressing the code status.   Assessment & Plan:   Sepsis secondary to UTI versus intra-abdominal source - Stopped vanco 9/1 as pt family declined blood work - Pt on zosyn - Blood and urine cx negative  Hyperkalemia due to [employer] possibly tissue necrosis from large mets - Sodium bicarb amps given - Pt family declined blood work  AKI with metabolic acidosis - Due to dehydration - As already noted pt family declined blood work  Metastatic colon cancer - Poor prognosis - On fentanyl drip - Has agonal breaths; family adamant about full code - CCM will speak with family to encourage code status change as pt is actively dying   Systolic congestive heart failure, EF 35-40% - Monitor daily - Pt declined blood work  Acute metabolic encephalopathy - Due to metastatic liver disease  Transaminitis / Elevated bilirubin - Due to metastatic lesions in the liver  Lower extremity  edema - Likely third spacing from malignancy, ascites, CHD   DVT prophylaxis: Hep subQ Code Status: full code  Family Communication: husband, pt sisters at bedside Disposition Plan: remains acute, not stable for transfer to discharge    Consultants:   Oncology  PCT  CCM - consulted this am for help with addressing code status   Procedures:   None   Antimicrobials:   Zosyn -->  Vanco stopped 9/1   Subjective: Emesis this am.  Objective: Vitals:   04/10/17 0326 04/10/17 0600 04/10/17 0800 04/10/17 0900  BP:  (!) 133/101 109/73 122/86  Pulse:  85 73 81  Resp:  16 13 18   Temp: (!) 97.4 F (36.3 C)  (!) 97.5 F (36.4 C)   TempSrc: Axillary  Oral   SpO2:  99% 100% 100%  Weight:      Height:        Intake/Output Summary (Last 24 hours) at 04/10/17 1016 Last data filed at 04/10/17 0655  Gross per 24 hour  Intake          2730.53 ml  Output                0 ml  Net          2730.53 ml   Filed Weights   04/06/17 0344 04/08/17 0500 04/09/17 0500  Weight: 79.2 kg (174 lb 9.7 oz) 79.8 kg (175 lb 14.8 oz) 79.8 kg (175 lb 14.8 oz)    Physical Exam  Constitutional: agonal breathing, moaning  CVS: Rate controlled, S1/S2 + Pulmonary: diminished, coarse sounds  Abdominal: distended softly, (+) BS  Musculoskeletal: +2 LE edema, cool extremities  Neuro: Does not respond to verbal and only minimally responded to sternal rub Skin: cool extremities Psychiatric: Unable to assess, does not respond to verbal stimuli, only minimally responds to painful stimuli     Data Reviewed: I have personally reviewed following labs and imaging studies  CBC:  Recent Labs Lab 03/15/2017 1311 04/06/17 0341 04/07/17 0614 04/08/17 0335  WBC 25.5* 27.8* 29.0* 28.2*  NEUTROABS 22.9*  --   --   --   HGB 11.3* 10.9* 10.9* 10.7*  HCT 36.3 34.6* 35.0* 34.1*  MCV 78.6 79.5 79.0 80.4  PLT 476* 392 450* 161   Basic Metabolic Panel:  Recent Labs Lab 03/28/2017 1311 03/19/2017 1809  04/06/17 0341 04/07/17 0614 04/08/17 0335  NA 146* 145 144 145 143  K 6.5* 5.3* 5.5* 5.5* 5.5*  CL 115* 117* 114* 116* 114*  CO2 15* 15* 15* 14* 13*  GLUCOSE 94 138* 160* 150* 138*  BUN 87* 83* 82* 94* 96*  CREATININE 2.42* 2.17* 2.27* 2.68* 2.83*  CALCIUM 9.3 8.3* 8.8* 8.9 8.7*   GFR: Estimated Creatinine Clearance: 23.2 mL/min (A) (by C-G formula based on SCr of 2.83 mg/dL (H)). Liver Function Tests:  Recent Labs Lab 04/07/2017 1311 04/06/17 0341 04/07/17 0614 04/08/17 0335  AST 353* 406* 479* 456*  ALT 95* 105* 130* 130*  ALKPHOS 482* 428* 508* 522*  BILITOT 4.6* 4.2* 5.2* 5.2*  PROT 6.6 6.0* 6.0* 5.9*  ALBUMIN 2.2* 1.9* 1.9* 1.8*   No results for input(s): LIPASE, AMYLASE in the last 168 hours.  Recent Labs Lab 03/29/2017 1311 04/06/17 1018  AMMONIA 56* 66*   Coagulation Profile:  Recent Labs Lab 03/19/2017 1627 04/06/17 0341  INR 3.93 2.21   Cardiac Enzymes: No results for input(s): CKTOTAL, CKMB, CKMBINDEX, TROPONINI in the last 168 hours. BNP (last 3 results) No results for input(s): PROBNP in the last 8760 hours. HbA1C: No results for input(s): HGBA1C in the last 72 hours. CBG:  Recent Labs Lab 04/07/17 0104  GLUCAP 101*   Lipid Profile: No results for input(s): CHOL, HDL, LDLCALC, TRIG, CHOLHDL, LDLDIRECT in the last 72 hours. Thyroid Function Tests: No results for input(s): TSH, T4TOTAL, FREET4, T3FREE, THYROIDAB in the last 72 hours. Anemia Panel: No results for input(s): VITAMINB12, FOLATE, FERRITIN, TIBC, IRON, RETICCTPCT in the last 72 hours. Urine analysis:    Component Value Date/Time   COLORURINE BROWN (A) 03/30/2017 1207   APPEARANCEUR HAZY (A) 04/01/2017 1207   LABSPEC >1.030 (H) 03/09/2017 1207   PHURINE 6.5 04/08/2017 1207   GLUCOSEU NEGATIVE 03/16/2017 1207   HGBUR LARGE (A) 03/24/2017 1207   BILIRUBINUR MODERATE (A) 03/12/2017 1207   KETONESUR TRACE (A) 04/07/2017 1207   PROTEINUR 100 (A) 03/16/2017 1207   NITRITE POSITIVE  (A) 04/04/2017 1207   LEUKOCYTESUR TRACE (A) 03/30/2017 1207   Sepsis Labs: @LABRCNTIP (procalcitonin:4,lacticidven:4)   Recent Results (from the past 240 hour(s))  Urine culture     Status: None   Collection Time: 03/28/2017 12:07 PM  Result Value Ref Range Status   Specimen Description URINE, CATHETERIZED  Final   Special Requests NONE  Final   Culture   Final    NO GROWTH Performed at [facility], [place] [address] [place], [place] [address]    Report Status 04/06/2017 FINAL  Final  Culture, blood (Routine X 2) w Reflex to ID Panel     Status: None (Preliminary result)   Collection Time: 03/10/2017  5:55 PM  Result Value  Ref Range Status   Specimen Description BLOOD LEFT ANTECUBITAL  Final   Special Requests IN PEDIATRIC BOTTLE Blood Culture adequate volume  Final   Culture   Final    NO GROWTH 4 DAYS Performed at [facility], [place] [address], [place], [place] [address]    Report Status PENDING  Incomplete  MRSA PCR Screening     Status: None   Collection Time: 04/07/2017  6:13 PM  Result Value Ref Range Status   MRSA by PCR NEGATIVE NEGATIVE Final    Comment:        The GeneXpert MRSA Assay (FDA approved for NASAL specimens only), is one component of a comprehensive MRSA colonization surveillance program. It is not intended to diagnose MRSA infection nor to guide or monitor treatment for MRSA infections.   Culture, blood (Routine X 2) w Reflex to ID Panel     Status: None (Preliminary result)   Collection Time: 03/24/2017  6:25 PM  Result Value Ref Range Status   Specimen Description BLOOD LEFT ARM  Final   Special Requests IN PEDIATRIC BOTTLE Blood Culture adequate volume  Final   Culture   Final    NO GROWTH 4 DAYS Performed at [facility], [place] [address], [place], [place] [address]    Report Status PENDING  Incomplete      Radiology Studies: No results found.    Scheduled Meds: . dexamethasone  4 mg Intravenous Q12H  . heparin subQ  5,000  Units Subcutaneous Q8H  . lactulose  20 g Oral TID   Continuous Infusions: . sodium chloride    . dextrose 5 % and 0.45% NaCl 100 mL (04/10/17 0439)  . fentaNYL infusion INTRAVENOUS 30 mcg/hr (04/09/17 1831)  . piperacillin-tazobactam (ZOSYN)  IV Stopped (04/10/17 0725)     LOS: 5 days    Time spent: 25 minutes  Greater than 50% of the time spent on counseling and coordinating the care.   Leisa Lenz, MD [facility] Pager [phone]  If 7PM-7AM, please contact night-coverage [email or web address] Password [email or web address] 04/10/2017, 10:16 AM

## 2017-04-10 NOTE — Progress Notes (Signed)
Pharmacy Antibiotic Note  Christina [name] is a 56 y.o. female with hx metastatic colon cancer to liver presented to the ED on 04/04/2017 with sepsis.  Vancomycin and zosyn were started on admission for broad coverage.  Poor prognosis but family still wants full txment for patient.  Today, 04/10/2017: - Day #5 abx - all cultures have been negative thus far - afeb, wbc up 28 on 8/31 -  scr up 2.83 (crcl~23) on 8/31 - family currently refusing lab draw   Plan: - continue zosyn to 2.25 gm IV q8h  ______________________________  Height: 5\' 5"  (165.1 cm) Weight: 175 lb 14.8 oz (79.8 kg) IBW/kg (Calculated) : 57  Temp (24hrs), Avg:97.1 F (36.2 C), Min:96.5 F (35.8 C), Max:97.5 F (36.4 C)   Recent Labs Lab 04/06/2017 1311 03/15/2017 1501 03/28/2017 1809 03/18/2017 1822 04/06/17 0341 04/07/17 0614 04/08/17 0335 04/08/17 1200  WBC 25.5*  --   --   --  27.8* 29.0* 28.2*  --   CREATININE 2.42*  --  2.17*  --  2.27* 2.68* 2.83*  --   LATICACIDVEN  --  3.8*  --  3.7*  --   --   --   --   VANCORANDOM  --   --   --   --   --   --   --  21    Estimated Creatinine Clearance: 23.2 mL/min (A) (by C-G formula based on SCr of 2.83 mg/dL (H)).    Allergies  Allergen Reactions  . Lactose Intolerance (Gi) Other (See Comments)    Diarrhea, Bloated, Abdominal pain.  Marland Kitchen Morphine And Related Anaphylaxis    Sedation  . Dilaudid [Hydromorphone Hcl]     Severe cramping   . Lasix [Furosemide] Nausea And Vomiting    Problems with IV only  . Lorazepam Other (See Comments)    Pt couldn't walk straight, put her in a daze   Antimicrobials this admission:  8/28 CTX x1 8/28 Vanc>>9/1 8/28 zosyn>>  Dose adjustments this admission:  8/30 1730 VT= ______ on 750mg  IV q24h (before 3rd dose)- lab dc'd - husband said MD said no more labs draws 8/31 VR at noon=   21 (41 hrs after last dose of 750 mg) -- redose 1000 mg  Microbiology results:  8/28 Ucx: NGF 8/28 bcx x2:NGTD 8/28 MRSA PCR: neg   Thank  you for allowing pharmacy to be a part of this patient's care.  Lynelle Doctor 04/10/2017 10:33 AM

## 2017-04-11 NOTE — Progress Notes (Signed)
I talked with sister and husband about lower BP. They were very concerned about lower BP so they called a doctor friend who they had me talk with in a 3 way speaker call. I explained pt was moaning and I believed maybe in pain. However if I give pain meds without medications to raise BP she may have even lower BP. After decision with doctor on phone and family they requested we "hold the course" and continue the fluids running but NOT give meds to increase blood pressure. About 30 minutes later the husband came with the sister and asked that I give pain medications so I gave her Fentanyl bolus and advised if she continued to have pain/moaning let me know. I called Elink and advised.

## 2017-04-11 NOTE — Care Management Note (Signed)
Case Management Note  Patient Details  Name: Christina [name] MRN: [id number] Date of Birth: 1961-05-08  Subjective/Objective:                  Resp.distress, hypotension, hospice patient pending.  Action/Plan: Date:  April 11, 2017 Chart reviewed for concurrent status and case management needs. Will continue to follow patient progress. Discharge Planning: following for needs Expected discharge date: [id number] Velva Harman, BSN, [place], [place]  Expected Discharge Date:   (unknown)               Expected Discharge Plan:  [place]  In-House Referral:     Discharge planning Services  CM Consult  Post Acute Care Choice:  Durable Medical Equipment Choice offered to:  Spouse  DME Arranged:  3-N-1, Hospital bed DME Agency:  [place]:    [facility] Agency:     Status of Service:  In process, will continue to follow  If discussed at Long Length of Stay Meetings, dates discussed:    Additional Comments:  Leeroy Cha, RN 04/11/2017, 8:55 AM

## 2017-04-11 NOTE — Progress Notes (Signed)
Daily Progress Note   Patient Name: Christina [name]       Date: 04/11/2017 DOB: 1960/12/15  Age: 56 y.o. 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 She does not appear to be in distress at this time, she is unresponsive.  Discussed symptom management with family and recommended using pain medication as needed regardless of her BP when she appears to be in distress.  Length of Stay: 6  Current Medications: Scheduled Meds:  . Chlorhexidine Gluconate Cloth  6 each Topical Daily  . dexamethasone  4 mg Intravenous Q12H  . heparin subcutaneous  5,000 Units Subcutaneous Q8H  . mouth rinse  15 mL Mouth Rinse BID  . sodium chloride flush  10-40 mL Intracatheter Q12H    Continuous Infusions: . dextrose 5 % and 0.45% NaCl 100 mL/hr at 04/11/17 1240  . fentaNYL infusion INTRAVENOUS 30 mcg/hr (04/11/17 0207)  . piperacillin-tazobactam (ZOSYN)  IV Stopped (04/11/17 0703)    PRN Meds: bisacodyl, fentaNYL, fentaNYL (SUBLIMAZE) injection, ondansetron **OR** ondansetron (ZOFRAN) IV, sodium chloride flush  Physical Exam      General: Somnolent.  Appears comfortable at this time. Heart: Tachycardic Lungs: Fair air movement, some brief pauses noted Abdomen: Soft, mildly tender globally, positive bowel sounds.  Ext: No significant edema Skin: Warm and dry     Vital Signs: BP (!) 72/42   Pulse 67   Temp (!) 97.2 F (36.2 C) (Axillary)   Resp 18   Ht 5\' 5"  (1.651 m)   Wt 79.8 kg (175 lb 14.8 oz)   SpO2 98%   BMI 29.28 kg/m  SpO2: SpO2: 98 % O2 Device: O2 Device: Not Delivered O2 Flow Rate:     Intake/output summary:   Intake/Output Summary (Last 24 hours) at 04/11/17 1349 Last data filed at 04/10/17 2200  Gross per 24 hour  Intake          1233.58 ml  Output              550 ml  Net           683.58 ml   LBM: Last BM Date: 04/09/17 Baseline Weight: Weight: 71.6 kg (157 lb 13.6 oz)  Most recent weight: Weight: 79.8 kg (175 lb 14.8 oz)       Palliative Assessment/Data: 20% at best      Patient Active Problem List   Diagnosis Date Noted  . Sepsis ([place]) 04/07/2017  . Acute metabolic encephalopathy 52/84/1324  . Hyperkalemia 03/29/2017  . Acute kidney injury ([place]) 03/26/2017  . Transaminitis 03/09/2017  . Elevated bilirubin 03/27/2017  . Lower extremity edema 03/11/2017  . Hypotension 03/23/2017  . Dehydration 03/23/2017  . SIRS (systemic inflammatory response syndrome) ([place]) 03/23/2017  . Weakness 03/23/2017  . Hypercalcemia 03/23/2017  . Goals of care, counseling/discussion 07/22/2016  . Congestive dilated cardiomyopathy ([place]) 06/17/2016  . Community acquired pneumonia of left lower lobe of lung ([place]) 06/16/2016  . Acute respiratory failure with hypoxia ([place]) 06/16/2016  . Acute on chronic combined systolic and diastolic congestive heart failure ([place]) 06/16/2016  . Port catheter in place 12/04/2015  . Chemotherapy induced neutropenia ([place]) 08/07/2015  . Rectal pain 07/02/2015  . Metastatic colon cancer to liver ([place]) 04/08/2015  . Hypertension   . Thalassanemia   . Nausea & vomiting 04/06/2015  .  Pelvic mass - 9cm - ?fibroid vs drop metastasis 04/06/2015  . Hypokalemia 04/06/2015  . Iron deficiency anemia due to chronic blood loss 04/06/2015    Palliative Care Assessment & Plan   Patient Profile: Christina [name] is a 56 y.o.femalewith medical history significant of metastatic colon cancer, HFrEF, HTN, iron deficiency anemia/beta thal.  Recommendations/Plan:  Husband discussed with PCCM and patient is now DNR but otherwise plan to continue current  medical therapies.  She appears to be actively dying on exam and appears comfortable at this time.  Currently on fentanyl 39mcg/hour with additional 74mcg bolus to be given every 30 minutes as needed for breakthrough pain.  While plan remains for continued  Medical care and she is not full comfort, I would continue to give medications as needed for her pain as she appears to be actively dying and appropriately treating her suffering and pain will not impact this fact.   Code Status:    Code Status Orders        Start     Ordered   03/25/2017 1809  Full code  Continuous     03/10/2017 1809    Code Status History    Date Active Date Inactive Code Status Order ID Comments User Context   03/23/2017  5:56 PM 03/27/2017 12:18 PM Full Code [id number]  Ladene Artist., MD Inpatient   06/16/2016 11:06 PM 06/20/2016  5:52 PM Full Code [id number]  Rise Patience, MD ED   04/08/2015  6:59 PM 04/11/2015  1:37 PM Full Code [id number]  Michael Boston, MD Inpatient   04/06/2015  1:41 PM 04/08/2015  6:59 PM Full Code [id number]  Mendel Corning, MD Inpatient       Prognosis:  Hours to days  Discharge Planning:  It is likely this will be a terminal admission  Care plan was discussed with husband, sister, and bedside RN  Thank you for allowing the Palliative Medicine Team to assist in the care of this patient.   Total Time 30 Prolonged Time Billed No      Greater than 50%  of this time was spent counseling and coordinating care related to the above assessment and plan.  Micheline Rough, MD  Please contact Palliative Medicine Team phone at [phone] for questions and concerns.   Micheline Rough, MD [place] Team [phone]

## 2017-04-11 NOTE — Progress Notes (Signed)
Patient ID: Christina [name], female   DOB: [birth year], 56 y.o.   MRN: [id number]  PROGRESS NOTE    Christina [name]  ZSW:[id number] DOB: 06-24-1961 DOA: 03/16/2017  PCP: Benito Mccreedy, MD   Brief Narrative:  56 year old female with metastatic colon cancer, thalassemia trait, systolic CHF with EF 32-20%, last received chemo in 02/2017, hospitalized 08/15 - 808/19 for dehydration, AKI, hypercalcemia and weakness who presented to ED due to ongoing abdominal pain despite taking home analgesics. Pt has significantly declined since the admission. PCT consulted for [place]. Pt husband adamant to continue aggressive care. Due to severe pain not controlled with PCA, pt placed on fentanyl drip for pain control.   Assessment & Plan:   Sepsis secondary to UTI versus intra-abdominal source - Continue zosyn - Pt family declined blood work  Hyperkalemia due to [place] possibly tissue necrosis from large mets - Sodium bicarb amps given - Pt family decline blood work  Coffee ground emesis - Has had coffea ground emesis 9/2 after pt husband fed pt apple sauce - NG tube in place   AKI with metabolic acidosis - Due to dehydration  Metastatic colon cancer - Poor prognosis - On fentanyl drip  Systolic congestive heart failure, EF 35-40% - Has LE edema - Agonal breathing   Acute metabolic encephalopathy - Due to metastatic liver disease  Transaminitis / Elevated bilirubin - Due to metastatic lesions in the liver  Lower extremity edema - Likely third spacing from malignancy, ascites, CHD   DVT prophylaxis: Stop hep subQ due to coffee ground emesis  Code Status: DNR/DNI  Family Communication: husband and sisters at the bedside  Disposition Plan: remains in SDU, still family wants everything to be doen other than DNR/DNI   Consultants:   Oncology  PCT  CCM - consulted this am for help with addressing code status   Procedures:   NG tube 9/2 -->  Antimicrobials:   Zosyn  -->  Vanco stopped 9/1   Subjective: Coffee ground emesis.  Objective: Vitals:   04/11/17 0700 04/11/17 0800 04/11/17 0804 04/11/17 0900  BP: (!) 82/54 (!) 78/55  (!) 75/49  Pulse: 66 63  65  Resp: 16 17  15   Temp:   (!) 96.4 F (35.8 C)   TempSrc:   Axillary   SpO2: 98% 94%  97%  Weight:      Height:        Intake/Output Summary (Last 24 hours) at 04/11/17 1051 Last data filed at 04/10/17 2200  Gross per 24 hour  Intake          1233.58 ml  Output              550 ml  Net           683.58 ml   Filed Weights   04/06/17 0344 04/08/17 0500 04/09/17 0500  Weight: 79.2 kg (174 lb 9.7 oz) 79.8 kg (175 lb 14.8 oz) 79.8 kg (175 lb 14.8 oz)    Physical Exam  Constitutional: Appears ill, agonal breathing  CVS: RRR, S1/S2 + Pulmonary: diminished, coarse breath sounds  Abdominal: Soft. BS +,  Distended  Musculoskeletal: +2 LE edema Neuro: not responding to verbal or painful stimuli Skin: cold skin Psychiatric: No restlessness       Data Reviewed: I have personally reviewed following labs and imaging studies  CBC:  Recent Labs Lab 03/18/2017 1311 04/06/17 0341 04/07/17 0614 04/08/17 0335  WBC 25.5* 27.8* 29.0* 28.2*  NEUTROABS 22.9*  --   --   --  HGB 11.3* 10.9* 10.9* 10.7*  HCT 36.3 34.6* 35.0* 34.1*  MCV 78.6 79.5 79.0 80.4  PLT 476* 392 450* 885   Basic Metabolic Panel:  Recent Labs Lab 04/04/2017 1311 03/21/2017 1809 04/06/17 0341 04/07/17 0614 04/08/17 0335  NA 146* 145 144 145 143  K 6.5* 5.3* 5.5* 5.5* 5.5*  CL 115* 117* 114* 116* 114*  CO2 15* 15* 15* 14* 13*  GLUCOSE 94 138* 160* 150* 138*  BUN 87* 83* 82* 94* 96*  CREATININE 2.42* 2.17* 2.27* 2.68* 2.83*  CALCIUM 9.3 8.3* 8.8* 8.9 8.7*   GFR: Estimated Creatinine Clearance: 23.2 mL/min (A) (by C-G formula based on SCr of 2.83 mg/dL (H)). Liver Function Tests:  Recent Labs Lab 03/25/2017 1311 04/06/17 0341 04/07/17 0614 04/08/17 0335  AST 353* 406* 479* 456*  ALT 95* 105* 130* 130*    ALKPHOS 482* 428* 508* 522*  BILITOT 4.6* 4.2* 5.2* 5.2*  PROT 6.6 6.0* 6.0* 5.9*  ALBUMIN 2.2* 1.9* 1.9* 1.8*   No results for input(s): LIPASE, AMYLASE in the last 168 hours.  Recent Labs Lab 03/15/2017 1311 04/06/17 1018  AMMONIA 56* 66*   Coagulation Profile:  Recent Labs Lab 04/02/2017 1627 04/06/17 0341  INR 3.93 2.21   Cardiac Enzymes: No results for input(s): CKTOTAL, CKMB, CKMBINDEX, TROPONINI in the last 168 hours. BNP (last 3 results) No results for input(s): PROBNP in the last 8760 hours. HbA1C: No results for input(s): HGBA1C in the last 72 hours. CBG:  Recent Labs Lab 04/07/17 0104  GLUCAP 101*   Lipid Profile: No results for input(s): CHOL, HDL, LDLCALC, TRIG, CHOLHDL, LDLDIRECT in the last 72 hours. Thyroid Function Tests: No results for input(s): TSH, T4TOTAL, FREET4, T3FREE, THYROIDAB in the last 72 hours. Anemia Panel: No results for input(s): VITAMINB12, FOLATE, FERRITIN, TIBC, IRON, RETICCTPCT in the last 72 hours. Urine analysis:    Component Value Date/Time   COLORURINE BROWN (A) 03/25/2017 1207   APPEARANCEUR HAZY (A) 03/27/2017 1207   LABSPEC >1.030 (H) 03/11/2017 1207   PHURINE 6.5 03/16/2017 1207   GLUCOSEU NEGATIVE 04/03/2017 1207   HGBUR LARGE (A) 03/13/2017 1207   BILIRUBINUR MODERATE (A) 03/31/2017 1207   KETONESUR TRACE (A) 03/19/2017 1207   PROTEINUR 100 (A) 04/02/2017 1207   NITRITE POSITIVE (A) 03/19/2017 1207   LEUKOCYTESUR TRACE (A) 03/27/2017 1207   Sepsis Labs: @LABRCNTIP (procalcitonin:4,lacticidven:4)   Recent Results (from the past 240 hour(s))  Urine culture     Status: None   Collection Time: 03/29/2017 12:07 PM  Result Value Ref Range Status   Specimen Description URINE, CATHETERIZED  Final   Special Requests NONE  Final   Culture   Final    NO GROWTH Performed at [facility], [place] [address], [place], [place] [address]    Report Status 04/06/2017 FINAL  Final  Culture, blood (Routine X 2) w Reflex to  ID Panel     Status: None   Collection Time: 03/20/2017  5:55 PM  Result Value Ref Range Status   Specimen Description BLOOD LEFT ANTECUBITAL  Final   Special Requests IN PEDIATRIC BOTTLE Blood Culture adequate volume  Final   Culture   Final    NO GROWTH 5 DAYS Performed at [facility], [place] [address], [place], [place] [address]    Report Status 04/10/2017 FINAL  Final  MRSA PCR Screening     Status: None   Collection Time: 03/23/2017  6:13 PM  Result Value Ref Range Status   MRSA by  PCR NEGATIVE NEGATIVE Final    Comment:        The GeneXpert MRSA Assay (FDA approved for NASAL specimens only), is one component of a comprehensive MRSA colonization surveillance program. It is not intended to diagnose MRSA infection nor to guide or monitor treatment for MRSA infections.   Culture, blood (Routine X 2) w Reflex to ID Panel     Status: None   Collection Time: 03/09/2017  6:25 PM  Result Value Ref Range Status   Specimen Description BLOOD LEFT ARM  Final   Special Requests IN PEDIATRIC BOTTLE Blood Culture adequate volume  Final   Culture   Final    NO GROWTH 5 DAYS Performed at [facility], [address], [place], [place] [address]    Report Status 04/10/2017 FINAL  Final      Radiology Studies: No results found.    Scheduled Meds: . dexamethasone  4 mg Intravenous Q12H  . heparin subQ  5,000 Units Subcutaneous Q8H  . lactulose  20 g Oral TID   Continuous Infusions: . sodium chloride    . dextrose 5 % and 0.45% NaCl 100 mL/hr (04/11/17 0208)  . fentaNYL infusion INTRAVENOUS 30 mcg/hr (04/11/17 0207)  . piperacillin-tazobactam (ZOSYN)  IV Stopped (04/11/17 0703)     LOS: 6 days    Time spent: 25 minutes  Greater than 50% of the time spent on counseling and coordinating the care.   Leisa Lenz, MD [facility] Pager [phone]  If 7PM-7AM, please contact night-coverage [email or web address] Password [email or web address] 04/11/2017, 10:51 AM

## 2017-04-12 DIAGNOSIS — Z515 Encounter for palliative care: Secondary | ICD-10-CM

## 2017-04-12 NOTE — Progress Notes (Addendum)
Patient ID: Christina [name], female   DOB: 1961-04-28, 56 y.o.   MRN: [id number]  PROGRESS NOTE    Olivette Beckmann  GEX:[id number] DOB: Jun 11, 1961 DOA: 04/02/2017  PCP: Benito Mccreedy, MD   Brief Narrative:  56 year old female with metastatic colon cancer, thalassemia trait, systolic CHF with EF 01-02%, last received chemo in 02/2017, hospitalized 08/15 - 808/19 for dehydration, AKI, hypercalcemia and weakness who presented to ED due to ongoing abdominal pain despite taking home analgesics. Pt has significantly declined since the admission. PCT consulted for [place]. Pt husband adamant to continue aggressive care. Due to severe pain not controlled with PCA, pt placed on fentanyl drip for pain control.   Assessment & Plan:   Sepsis secondary to UTI versus intra-abdominal source - Stop zosyn today - She has been on abx since admission. At this time, her breathing is agonal and she is approaching the end of life. Abx will not prolong her life so will stop zosyn today.  Hyperkalemia due to [facility] possibly tissue necrosis from large mets - Sodium bicarb amps given - Pt family decline blood work  Coffee ground emesis - Has had coffea ground emesis 9/2 after pt husband fed pt apple sauce - Has NG tube   AKI with metabolic acidosis - Due to dehydration - No further blood work due to discomfort per family request   Metastatic colon cancer - On fentanyl drip for comfort  - Will continue decadron IV  Systolic congestive heart failure, EF 35-40% - Agonal breathing  Acute metabolic encephalopathy - Due to metastatic liver disease  Transaminitis / Elevated bilirubin - Due to metastatic lesions in the liver - No further blood work per family at the bedside  Lower extremity edema - Likely third spacing from malignancy, ascites, CHD   DVT prophylaxis: SCD's Code Status: DNR/DNI  Family Communication: multiple family at the bedside Disposition Plan: remains in  SDU   Consultants:   Oncology  PCT  CCM - consulted this am for help with addressing code status   Procedures:   NG tube 9/2 -->  Antimicrobials:   Zosyn --> 9/4  Vanco stopped 9/1   Subjective: No overnight events.   Objective: Vitals:   04/12/17 0700 04/12/17 0800 04/12/17 0900 04/12/17 1000  BP: (!) 67/42 (!) 67/40 (!) 61/40 (!) 64/41  Pulse: 64 64 65 65  Resp: 14 14 14 14   Temp:      TempSrc:      SpO2: 96% 97% 96% 91%  Weight:      Height:        Intake/Output Summary (Last 24 hours) at 04/12/17 1242 Last data filed at 04/12/17 0635  Gross per 24 hour  Intake          1662.75 ml  Output              900 ml  Net           762.75 ml   Filed Weights   04/06/17 0344 04/08/17 0500 04/09/17 0500  Weight: 79.2 kg (174 lb 9.7 oz) 79.8 kg (175 lb 14.8 oz) 79.8 kg (175 lb 14.8 oz)   Physical Exam  Constitutional: Appears agonal, not responding to verbal or painful stimuli CVS: RRR, S1/S2 + Pulmonary: diminished breath sounds Abdominal: Soft. BS +,  Distended abdomen, has a protrusion of mass on abdomen family says its ever since biopsy; Has NG tube Musculoskeletal:+2 LE pitting edema, cool extremities Neuro: Agonal, not alert Skin: Skin is cool, dry Psychiatric:Unable to  assess due to unresponsiveness to verbal or painful stimuli   Data Reviewed: I have personally reviewed following labs and imaging studies  CBC:  Recent Labs Lab 03/24/2017 1311 04/06/17 0341 04/07/17 0614 04/08/17 0335  WBC 25.5* 27.8* 29.0* 28.2*  NEUTROABS 22.9*  --   --   --   HGB 11.3* 10.9* 10.9* 10.7*  HCT 36.3 34.6* 35.0* 34.1*  MCV 78.6 79.5 79.0 80.4  PLT 476* 392 450* 659   Basic Metabolic Panel:  Recent Labs Lab 03/12/2017 1311 03/22/2017 1809 04/06/17 0341 04/07/17 0614 04/08/17 0335  NA 146* 145 144 145 143  K 6.5* 5.3* 5.5* 5.5* 5.5*  CL 115* 117* 114* 116* 114*  CO2 15* 15* 15* 14* 13*  GLUCOSE 94 138* 160* 150* 138*  BUN 87* 83* 82* 94* 96*  CREATININE  2.42* 2.17* 2.27* 2.68* 2.83*  CALCIUM 9.3 8.3* 8.8* 8.9 8.7*   GFR: Estimated Creatinine Clearance: 23.2 mL/min (A) (by C-G formula based on SCr of 2.83 mg/dL (H)). Liver Function Tests:  Recent Labs Lab 03/31/2017 1311 04/06/17 0341 04/07/17 0614 04/08/17 0335  AST 353* 406* 479* 456*  ALT 95* 105* 130* 130*  ALKPHOS 482* 428* 508* 522*  BILITOT 4.6* 4.2* 5.2* 5.2*  PROT 6.6 6.0* 6.0* 5.9*  ALBUMIN 2.2* 1.9* 1.9* 1.8*   No results for input(s): LIPASE, AMYLASE in the last 168 hours.  Recent Labs Lab 04/02/2017 1311 04/06/17 1018  AMMONIA 56* 66*   Coagulation Profile:  Recent Labs Lab 04/03/2017 1627 04/06/17 0341  INR 3.93 2.21   Cardiac Enzymes: No results for input(s): CKTOTAL, CKMB, CKMBINDEX, TROPONINI in the last 168 hours. BNP (last 3 results) No results for input(s): PROBNP in the last 8760 hours. HbA1C: No results for input(s): HGBA1C in the last 72 hours. CBG:  Recent Labs Lab 04/07/17 0104  GLUCAP 101*   Lipid Profile: No results for input(s): CHOL, HDL, LDLCALC, TRIG, CHOLHDL, LDLDIRECT in the last 72 hours. Thyroid Function Tests: No results for input(s): TSH, T4TOTAL, FREET4, T3FREE, THYROIDAB in the last 72 hours. Anemia Panel: No results for input(s): VITAMINB12, FOLATE, FERRITIN, TIBC, IRON, RETICCTPCT in the last 72 hours. Urine analysis:    Component Value Date/Time   COLORURINE BROWN (A) 04/01/2017 1207   APPEARANCEUR HAZY (A) 04/07/2017 1207   LABSPEC >1.030 (H) 03/13/2017 1207   PHURINE 6.5 03/22/2017 1207   GLUCOSEU NEGATIVE 03/21/2017 1207   HGBUR LARGE (A) 04/02/2017 1207   BILIRUBINUR MODERATE (A) 03/13/2017 1207   KETONESUR TRACE (A) 03/22/2017 1207   PROTEINUR 100 (A) 03/31/2017 1207   NITRITE POSITIVE (A) 03/31/2017 1207   LEUKOCYTESUR TRACE (A) 03/10/2017 1207   Sepsis Labs: @LABRCNTIP (procalcitonin:4,lacticidven:4)   Recent Results (from the past 240 hour(s))  Urine culture     Status: None   Collection Time:  04/06/2017 12:07 PM  Result Value Ref Range Status   Specimen Description URINE, CATHETERIZED  Final   Special Requests NONE  Final   Culture   Final    NO GROWTH Performed at [facility], [place] [address], [place], [place] [address]    Report Status 04/06/2017 FINAL  Final  Culture, blood (Routine X 2) w Reflex to ID Panel     Status: None   Collection Time: 03/16/2017  5:55 PM  Result Value Ref Range Status   Specimen Description BLOOD LEFT ANTECUBITAL  Final   Special Requests IN PEDIATRIC BOTTLE Blood Culture adequate volume  Final   Culture   Final  NO GROWTH 5 DAYS Performed at [facility], [place] [address] [place], [place] [address]    Report Status 04/10/2017 FINAL  Final  MRSA PCR Screening     Status: None   Collection Time: 04/04/2017  6:13 PM  Result Value Ref Range Status   MRSA by PCR NEGATIVE NEGATIVE Final    Comment:        The GeneXpert MRSA Assay (FDA approved for NASAL specimens only), is one component of a comprehensive MRSA colonization surveillance program. It is not intended to diagnose MRSA infection nor to guide or monitor treatment for MRSA infections.   Culture, blood (Routine X 2) w Reflex to ID Panel     Status: None   Collection Time: 03/11/2017  6:25 PM  Result Value Ref Range Status   Specimen Description BLOOD LEFT ARM  Final   Special Requests IN PEDIATRIC BOTTLE Blood Culture adequate volume  Final   Culture   Final    NO GROWTH 5 DAYS Performed at [facility], [address], [place], [place] [address]    Report Status 04/10/2017 FINAL  Final      Radiology Studies: No results found.    Scheduled Meds: . dexamethasone  4 mg Intravenous Q12H  . heparin subQ  5,000 Units Subcutaneous Q8H  . lactulose  20 g Oral TID   Continuous Infusions: . dextrose 5 % and 0.45% NaCl 125 mL/hr at 04/11/17 1530  . fentaNYL infusion INTRAVENOUS 30 mcg/hr (04/11/17 2157)  . piperacillin-tazobactam (ZOSYN)  IV Stopped (04/12/17  0635)     LOS: 7 days    Time spent: 25 minutes  Greater than 50% of the time spent on counseling and coordinating the care.   Leisa Lenz, MD [facility] Pager [phone]  If 7PM-7AM, please contact night-coverage [email or web address] Password [email or web address] 04/12/2017, 12:42 PM

## 2017-04-12 NOTE — Progress Notes (Signed)
Daily Progress Note   Patient Name: Christina [name]       Date: 04/12/2017 DOB: [birth year]  Age: 56 y.o. MRN#: [id number] Attending Physician: Robbie Lis, MD Primary Care Physician: Benito Mccreedy, MD Admit Date: 03/17/2017  Reason for Consultation/Follow-up: Establishing goals of care  Subjective: appears to be actively dying, appears unresponsive but comfortable Eyes open, glazed look, family holding vigil at the bedside.   Medication history reviewed, agree with current measures, see below:     Length of Stay: 7  Current Medications: Scheduled Meds:  . Chlorhexidine Gluconate Cloth  6 each Topical Daily  . dexamethasone  4 mg Intravenous Q12H  . mouth rinse  15 mL Mouth Rinse BID  . sodium chloride flush  10-40 mL Intracatheter Q12H    Continuous Infusions: . dextrose 5 % and 0.45% NaCl 125 mL/hr at 04/11/17 1530  . fentaNYL infusion INTRAVENOUS 30 mcg/hr (04/11/17 2157)  . piperacillin-tazobactam (ZOSYN)  IV Stopped (04/12/17 0635)    PRN Meds: bisacodyl, fentaNYL, fentaNYL (SUBLIMAZE) injection, ondansetron **OR** ondansetron (ZOFRAN) IV, sodium chloride flush  Physical Exam      General: Somnolent.  Appears comfortable at this time. Heart: Tachycardic Lungs: Fair air movement, some brief pauses noted Abdomen: Soft, mildly tender globally, positive bowel sounds.  Ext: No significant edema Skin: Warm and dry    Eyes open, glazed look.   Vital Signs: BP (!) 64/41   Pulse 65   Temp (!) 97.2 F (36.2 C) (Axillary)   Resp 14   Ht 5\' 5"  (1.651 m)   Wt 79.8 kg (175 lb 14.8 oz)   SpO2 91%   BMI 29.28 kg/m  SpO2: SpO2: 91 % O2 Device: O2 Device: Not Delivered O2 Flow Rate:    Intake/output summary:   Intake/Output Summary (Last 24 hours) at 04/12/17  1210 Last data filed at 04/12/17 6295  Gross per 24 hour  Intake          1662.75 ml  Output              900 ml  Net           762.75 ml   LBM: Last BM Date: 04/09/17 Baseline Weight: Weight: 71.6 kg (157 lb 13.6 oz) Most recent weight: Weight: 79.8 kg (175 lb 14.8 oz)  Palliative Assessment/Data: 10%       Patient Active Problem List   Diagnosis Date Noted  . Sepsis ([place]) 03/29/2017  . Acute metabolic encephalopathy 99/35/7017  . Hyperkalemia 04/04/2017  . Acute kidney injury ([place]) 03/31/2017  . Transaminitis 03/27/2017  . Elevated bilirubin 03/10/2017  . Lower extremity edema 03/29/2017  . Hypotension 03/23/2017  . Dehydration 03/23/2017  . SIRS (systemic inflammatory response syndrome) ([place]) 03/23/2017  . Weakness 03/23/2017  . Hypercalcemia 03/23/2017  . Goals of care, counseling/discussion 07/22/2016  . Congestive dilated cardiomyopathy ([place]) 06/17/2016  . Community acquired pneumonia of left lower lobe of lung ([place]) 06/16/2016  . Acute respiratory failure with hypoxia ([place]) 06/16/2016  . Acute on chronic combined systolic and diastolic congestive heart failure ([place]) 06/16/2016  . Port catheter in place 12/04/2015  . Chemotherapy induced neutropenia ([place]) 08/07/2015  . Rectal pain 07/02/2015  . Metastatic colon cancer to liver ([place]) 04/08/2015  . Hypertension   . Thalassanemia   . Nausea & vomiting 04/06/2015  .  Pelvic mass - 9cm - ?fibroid vs drop metastasis 04/06/2015  . Hypokalemia 04/06/2015  . Iron deficiency anemia due to chronic blood loss 04/06/2015    Palliative Care Assessment & Plan   Patient Profile: Ms. Drawdy is a 56 y.o.femalewith medical history significant of metastatic colon cancer, HFrEF, HTN, iron deficiency anemia/beta thal.  Recommendations/Plan: Continue current measures, agree with Fentanyl infusion, continue boluses prn, continue to support the family.    Code Status:    Code Status Orders        Start      Ordered   03/26/2017 1809  Full code  Continuous     03/30/2017 1809    Code Status History    Date Active Date Inactive Code Status Order ID Comments User Context   03/23/2017  5:56 PM 03/27/2017 12:18 PM Full Code [id number]  Ladene Artist., MD Inpatient   06/16/2016 11:06 PM 06/20/2016  5:52 PM Full Code [id number]  Rise Patience, MD ED   04/08/2015  6:59 PM 04/11/2015  1:37 PM Full Code [id number]  Michael Boston, MD Inpatient   04/06/2015  1:41 PM 04/08/2015  6:59 PM Full Code [id number]  Mendel Corning, MD Inpatient       Prognosis:  Hours to days  Discharge Planning: Anticipated hospital death  Care plan was discussed with  bedside RN  Thank you for allowing the Palliative Medicine Team to assist in the care of this patient.   Total Time 15 Prolonged Time Billed No      Greater than 50%  of this time was spent counseling and coordinating care related to the above assessment and plan.  Loistine Chance, MD  Please contact Palliative Medicine Team phone at [phone] for questions and concerns.   [facility] Team [phone]

## 2017-04-13 DIAGNOSIS — D649 Anemia, unspecified: Secondary | ICD-10-CM

## 2017-04-13 NOTE — Progress Notes (Addendum)
Patient ID: Christina [name], female   DOB: August 01, 1961, 56 y.o.   MRN: [id number]  PROGRESS NOTE    Dai Apel  QIO:[id number] DOB: 06-Feb-1961 DOA: 03/26/2017  PCP: Benito Mccreedy, MD   Brief Narrative:  56 year old female with metastatic colon cancer, thalassemia trait, systolic CHF with EF 32-44%, last received chemo in 02/2017, hospitalized 08/15 - 808/19 for dehydration, AKI, hypercalcemia and weakness who presented to ED due to ongoing abdominal pain despite taking home analgesics. Pt has significantly declined since the admission. PCT consulted for [place]. Pt husband adamant to continue aggressive care. Due to severe pain not controlled with PCA, pt placed on fentanyl drip for pain control.   Assessment & Plan:   Sepsis due to likely intra-abdominal source versus UTI -She completed 7 days of IV Zosyn without any clinical improvement, antibiotics stopped yesterday by Dr. Charlies Silvers after an eight-day course, and especially given the patient is near end-of-life  Metastatic colon cancer -With extensive metastases to liver, lungs, subcutaneous masses, suspected malignant ascites  now near end-of-life with encephalopathy, severe third spacing/anasarca, hypotension and multisystem failure  -Husband remains resistant to full comfort care, continue ongoing discussions with Dr. Ammie Dalton and palliative care  -Family has been told multiple times that her prognosis is likely hours  - now agrees to cutting back on the fluids a little due to severe third spacing  - labs stopped -She is on a fentanyl drip for comfort  Hyperkalemia due to [facility] possibly tissue necrosis from large mets - Sodium bicarb amps given earlier this admission  - Pt family decline blood work  Coffee ground emesis - Has had coffea ground emesis 9/2  - Has NG tube  -  continue supportive care   AKI with metabolic acidosis - Due to dehydration - No further blood work  Systolic congestive heart failure, EF  35-40% - Agonal breathing  Acute metabolic encephalopathy - Due to metastatic liver disease  Transaminitis / Elevated bilirubin - Due to metastatic lesions in the liver  Lower extremity edema - Likely third spacing from malignancy, ascites, CHD   DVT prophylaxis: SCD's Code Status: DNR/DNI  Family Communication: multiple family at the bedside Disposition Plan: remains in SDU   Consultants:   Oncology  PCT  CCM - consulted this am for help with addressing code status   Procedures:   NG tube 9/2 -->  Antimicrobials:   Zosyn --> 9/4  Vanco stopped 9/1   Subjective: Remains hypotensive with blood pressures in the 60s overnight  Objective: Vitals:   04/13/17 0900 04/13/17 1000 04/13/17 1100 04/13/17 1200  BP: (!) 60/36 (!) 65/38 (!) 62/35 (!) 62/34  Pulse: 60 61 61 61  Resp: 15 14 13 16   Temp:      TempSrc:      SpO2: 97% 97% 97% 97%  Weight:      Height:        Intake/Output Summary (Last 24 hours) at 04/13/17 1302 Last data filed at 04/13/17 1200  Gross per 24 hour  Intake          4043.89 ml  Output              700 ml  Net          3343.89 ml   Filed Weights   04/06/17 0344 04/08/17 0500 04/09/17 0500  Weight: 79.2 kg (174 lb 9.7 oz) 79.8 kg (175 lb 14.8 oz) 79.8 kg (175 lb 14.8 oz)   Physical Exam  Gen: Frail, cachectic, chronically  ill-appearing female, moaning HEENT: PERRLA, Neck supple, no JVD Lungs: Decreased breath sounds at the bases CVS: S1-S2, regular rate and rhythm Abd: Abdomen is very firm, nontender, mildly distended, bowel sounds diminished Extremities: 2-3+ edema extending up to upper thigh Skin: no new rashes  Data Reviewed: I have personally reviewed following labs and imaging studies  CBC:  Recent Labs Lab 04/07/17 0614 04/08/17 0335  WBC 29.0* 28.2*  HGB 10.9* 10.7*  HCT 35.0* 34.1*  MCV 79.0 80.4  PLT 450* 621   Basic Metabolic Panel:  Recent Labs Lab 04/07/17 0614 04/08/17 0335  NA 145 143  K 5.5* 5.5*    CL 116* 114*  CO2 14* 13*  GLUCOSE 150* 138*  BUN 94* 96*  CREATININE 2.68* 2.83*  CALCIUM 8.9 8.7*   GFR: Estimated Creatinine Clearance: 23.2 mL/min (A) (by C-G formula based on SCr of 2.83 mg/dL (H)). Liver Function Tests:  Recent Labs Lab 04/07/17 0614 04/08/17 0335  AST 479* 456*  ALT 130* 130*  ALKPHOS 508* 522*  BILITOT 5.2* 5.2*  PROT 6.0* 5.9*  ALBUMIN 1.9* 1.8*   No results for input(s): LIPASE, AMYLASE in the last 168 hours. No results for input(s): AMMONIA in the last 168 hours. Coagulation Profile: No results for input(s): INR, PROTIME in the last 168 hours. Cardiac Enzymes: No results for input(s): CKTOTAL, CKMB, CKMBINDEX, TROPONINI in the last 168 hours. BNP (last 3 results) No results for input(s): PROBNP in the last 8760 hours. HbA1C: No results for input(s): HGBA1C in the last 72 hours. CBG:  Recent Labs Lab 04/07/17 0104  GLUCAP 101*   Lipid Profile: No results for input(s): CHOL, HDL, LDLCALC, TRIG, CHOLHDL, LDLDIRECT in the last 72 hours. Thyroid Function Tests: No results for input(s): TSH, T4TOTAL, FREET4, T3FREE, THYROIDAB in the last 72 hours. Anemia Panel: No results for input(s): VITAMINB12, FOLATE, FERRITIN, TIBC, IRON, RETICCTPCT in the last 72 hours. Urine analysis:    Component Value Date/Time   COLORURINE BROWN (A) 03/14/2017 1207   APPEARANCEUR HAZY (A) 03/20/2017 1207   LABSPEC >1.030 (H) 03/12/2017 1207   PHURINE 6.5 03/13/2017 1207   GLUCOSEU NEGATIVE 04/01/2017 1207   HGBUR LARGE (A) 04/06/2017 1207   BILIRUBINUR MODERATE (A) 03/28/2017 1207   KETONESUR TRACE (A) 03/29/2017 1207   PROTEINUR 100 (A) 03/24/2017 1207   NITRITE POSITIVE (A) 03/29/2017 1207   LEUKOCYTESUR TRACE (A) 03/25/2017 1207   Sepsis Labs: @LABRCNTIP (procalcitonin:4,lacticidven:4)   Recent Results (from the past 240 hour(s))  Urine culture     Status: None   Collection Time: 03/12/2017 12:07 PM  Result Value Ref Range Status   Specimen  Description URINE, CATHETERIZED  Final   Special Requests NONE  Final   Culture   Final    NO GROWTH Performed at [facility], [place] [address] [place], [place] [address]    Report Status 04/06/2017 FINAL  Final  Culture, blood (Routine X 2) w Reflex to ID Panel     Status: None   Collection Time: 03/18/2017  5:55 PM  Result Value Ref Range Status   Specimen Description BLOOD LEFT ANTECUBITAL  Final   Special Requests IN PEDIATRIC BOTTLE Blood Culture adequate volume  Final   Culture   Final    NO GROWTH 5 DAYS Performed at [facility], [place] [address] [place], [place] [address]    Report Status 04/10/2017 FINAL  Final  MRSA PCR Screening     Status: None   Collection Time: 03/10/2017  6:13  PM  Result Value Ref Range Status   MRSA by PCR NEGATIVE NEGATIVE Final    Comment:        The GeneXpert MRSA Assay (FDA approved for NASAL specimens only), is one component of a comprehensive MRSA colonization surveillance program. It is not intended to diagnose MRSA infection nor to guide or monitor treatment for MRSA infections.   Culture, blood (Routine X 2) w Reflex to ID Panel     Status: None   Collection Time: 03/23/2017  6:25 PM  Result Value Ref Range Status   Specimen Description BLOOD LEFT ARM  Final   Special Requests IN PEDIATRIC BOTTLE Blood Culture adequate volume  Final   Culture   Final    NO GROWTH 5 DAYS Performed at [facility], [address], [place], [place] [address]    Report Status 04/10/2017 FINAL  Final      Radiology Studies: No results found.    Scheduled Meds: . dexamethasone  4 mg Intravenous Q12H  . heparin subQ  5,000 Units Subcutaneous Q8H  . lactulose  20 g Oral TID   Continuous Infusions: . dextrose 5 % and 0.45% NaCl 100 mL/hr at 04/13/17 1200  . fentaNYL infusion INTRAVENOUS 30 mcg/hr (04/13/17 1200)     LOS: 8 days    Time spent: 25 minutes  Greater than 50% of the time spent on counseling and coordinating the  care.   Domenic Polite, MD [facility] Pager [phone]  If 7PM-7AM, please contact night-coverage [email or web address] Password [email or web address] 04/13/2017, 1:02 PM

## 2017-04-13 NOTE — Progress Notes (Addendum)
PMT no charge note.   I met with the patient's RN outside the room, the door is shut, family members continue to hold vigil and be present with the patient who is actively dying, family has notified RN that they will let us know if they need anything.   Patient continues on fentanyl infusion 30 mcg per hour, discussed with RN about using prns for ongoing comfort.   Discussed about issues pertaining to artificial nutrition and hydration at end of life.  Vitals and med history reviewed.  Continue current mode of care, prognosis hours to some very limited number of days, anticipate hospital death, no additional palliative recommendations at this time.   Loistine Chance MD [place] Team

## 2017-04-14 DIAGNOSIS — Z515 Encounter for palliative care: Secondary | ICD-10-CM

## 2017-04-14 MED ORDER — LIP MEDEX EX OINT
TOPICAL_OINTMENT | CUTANEOUS | Status: AC
  Filled 2017-04-14: qty 7

## 2017-04-14 MED ORDER — LORAZEPAM 2 MG/ML IJ SOLN: 0.5000 mg | INTRAMUSCULAR | Status: DC | PRN

## 2017-04-14 MED ORDER — STERILE WATER FOR INJECTION IV SOLN
INTRAVENOUS | Status: DC
  Filled 2017-04-14: qty 850

## 2017-04-14 NOTE — Care Management Note (Signed)
Case Management Note  Patient Details  Name: Christina [name] MRN: [id number] Date of Birth: 01-07-1961  Subjective/Objective:     Hypotensive, husband still wants aggressive care,iv flds and iv fentanyl drip               Action/Plan: Date:  April 14, 2017 Chart reviewed for concurrent status and case management needs. Will continue to follow patient progress. Discharge Planning: following for needs Expected discharge date: [id number] Velva Harman, BSN, [place], [place]  Expected Discharge Date:   (unknown)               Expected Discharge Plan:  [place]  In-House Referral:     Discharge planning Services  CM Consult  Post Acute Care Choice:  Durable Medical Equipment Choice offered to:  Spouse  DME Arranged:  3-N-1, Hospital bed DME Agency:  Man:    [facility] Agency:     Status of Service:  In process, will continue to follow  If discussed at Long Length of Stay Meetings, dates discussed:    Additional Comments:  Leeroy Cha, RN 04/14/2017, 11:12 AM

## 2017-04-14 NOTE — Progress Notes (Signed)
Patient ID: Christina [name], female   DOB: Dec 09, 1960, 56 y.o.   MRN: [id number]  PROGRESS NOTE    Christina [name]  [id number]:[id number] DOB: June 15, 1961 DOA: 03/12/2017  PCP: Benito Mccreedy, MD   Brief Narrative:  56 year old female with metastatic colon cancer, thalassemia trait, systolic CHF with EF 89-38%, last received chemo in 02/2017, hospitalized 08/15 - 808/19 for dehydration, AKI, hypercalcemia and weakness who presented to ED due to ongoing abdominal pain despite taking home analgesics. Pt has significantly declined since the admission. PCT consulted for [place]. Pt husband adamant to continue aggressive care. Due to severe pain not controlled with PCA, pt placed on fentanyl drip for pain control.   Assessment & Plan:   Sepsis due to likely intra-abdominal source versus UTI -She completed 7 days of IV Zosyn without any clinical improvement, antibiotics stopped 9/4 by Dr. Charlies Silvers after an eight-day course, and especially given the patient is near end-of-life  Metastatic colon cancer -With extensive metastases to liver, lungs, subcutaneous masses, suspected malignant ascites  now near end-of-life with encephalopathy, severe third spacing/anasarca, hypotension and multisystem failure  -Husband remains resistant to full comfort care, continue ongoing discussions with family, Dr. Ammie Dalton and palliative care following -Family has been told multiple times that her prognosis is likely hours, they are still hoping for a miracle - 9/5 agreed to cutting back on the fluids a little due to severe third spacing  - labs stopped -She is on a fentanyl drip for comfort at 33mcg  Hyperkalemia due to [facility] possibly tissue necrosis from large mets - Sodium bicarb amps given earlier this admission  - Pt family declined blood work  Coffee ground emesis - Has had coffea ground emesis 9/2  - Has NG tube  -  continue supportive care, no blood draws  AKI with metabolic acidosis - Due to  dehydration - No further blood work  Systolic congestive heart failure, EF 35-40% - Agonal breathing  Acute metabolic encephalopathy - Due to metastatic liver disease, advanced cancer  Transaminitis / Elevated bilirubin - Due to metastatic lesions in the liver  Lower extremity edema - Likely third spacing from malignancy, hypoalbuminemia  DVT prophylaxis: SCD's Code Status: DNR/DNI  Family Communication: multiple family at the bedside Disposition Plan: remains in SDU   Consultants:   Oncology  PCT  CCM - consulted for help with addressing code status   Procedures:   NG tube 9/2 -->  Antimicrobials:   Zosyn --> 9/4  Vanco stopped 9/1   Subjective: -continues to moan and groan, remains hypotensive in 50s  Objective: Vitals:   04/14/17 0600 04/14/17 0800 04/14/17 1000 04/14/17 1200  BP: (!) 43/24 (!) 46/21 (!) 48/29 (!) 50/24  Pulse: (!) 59 (!) 59 60 60  Resp: 12 16 16 15   Temp:      TempSrc:      SpO2: 100% 100% 100% 100%  Weight:      Height:        Intake/Output Summary (Last 24 hours) at 04/14/17 1300 Last data filed at 04/14/17 1200  Gross per 24 hour  Intake             2219 ml  Output                0 ml  Net             2219 ml   Filed Weights   04/08/17 0500 04/09/17 0500 04/14/17 0347  Weight: 79.8 kg (175 lb 14.8 oz) 79.8 kg (  175 lb 14.8 oz) 94.1 kg (207 lb 7.3 oz)   Physical Exam   Gen: frail, cachectic, chronically ill appearing female, moaning HEENT: PERRLA, Neck supple, no JVD Lungs: decreased BS at bases CVS: RRR,No Gallops,Rubs or new Murmurs Abd: abd is very firm, distended, BS diminished  Extremities: 2plus edema upto thigh Skin: no new rashes  Data Reviewed: I have personally reviewed following labs and imaging studies  CBC:  Recent Labs Lab 04/08/17 0335  WBC 28.2*  HGB 10.7*  HCT 34.1*  MCV 80.4  PLT 267   Basic Metabolic Panel:  Recent Labs Lab 04/08/17 0335  NA 143  K 5.5*  CL 114*  CO2 13*   GLUCOSE 138*  BUN 96*  CREATININE 2.83*  CALCIUM 8.7*   GFR: Estimated Creatinine Clearance: 25.2 mL/min (A) (by C-G formula based on SCr of 2.83 mg/dL (H)). Liver Function Tests:  Recent Labs Lab 04/08/17 0335  AST 456*  ALT 130*  ALKPHOS 522*  BILITOT 5.2*  PROT 5.9*  ALBUMIN 1.8*   No results for input(s): LIPASE, AMYLASE in the last 168 hours. No results for input(s): AMMONIA in the last 168 hours. Coagulation Profile: No results for input(s): INR, PROTIME in the last 168 hours. Cardiac Enzymes: No results for input(s): CKTOTAL, CKMB, CKMBINDEX, TROPONINI in the last 168 hours. BNP (last 3 results) No results for input(s): PROBNP in the last 8760 hours. HbA1C: No results for input(s): HGBA1C in the last 72 hours. CBG: No results for input(s): GLUCAP in the last 168 hours. Lipid Profile: No results for input(s): CHOL, HDL, LDLCALC, TRIG, CHOLHDL, LDLDIRECT in the last 72 hours. Thyroid Function Tests: No results for input(s): TSH, T4TOTAL, FREET4, T3FREE, THYROIDAB in the last 72 hours. Anemia Panel: No results for input(s): VITAMINB12, FOLATE, FERRITIN, TIBC, IRON, RETICCTPCT in the last 72 hours. Urine analysis:    Component Value Date/Time   COLORURINE BROWN (A) 03/12/2017 1207   APPEARANCEUR HAZY (A) 04/03/2017 1207   LABSPEC >1.030 (H) 04/01/2017 1207   PHURINE 6.5 03/30/2017 1207   GLUCOSEU NEGATIVE 03/20/2017 1207   HGBUR LARGE (A) 03/28/2017 1207   BILIRUBINUR MODERATE (A) 04/06/2017 1207   KETONESUR TRACE (A) 03/19/2017 1207   PROTEINUR 100 (A) 03/25/2017 1207   NITRITE POSITIVE (A) 03/14/2017 1207   LEUKOCYTESUR TRACE (A) 03/19/2017 1207   Sepsis Labs: @LABRCNTIP (procalcitonin:4,lacticidven:4)   Recent Results (from the past 240 hour(s))  Urine culture     Status: None   Collection Time: 04/08/2017 12:07 PM  Result Value Ref Range Status   Specimen Description URINE, CATHETERIZED  Final   Special Requests NONE  Final   Culture   Final    NO  GROWTH Performed at [facility], [place] [address], [place], [place] [address]    Report Status 04/06/2017 FINAL  Final  Culture, blood (Routine X 2) w Reflex to ID Panel     Status: None   Collection Time: 03/21/2017  5:55 PM  Result Value Ref Range Status   Specimen Description BLOOD LEFT ANTECUBITAL  Final   Special Requests IN PEDIATRIC BOTTLE Blood Culture adequate volume  Final   Culture   Final    NO GROWTH 5 DAYS Performed at [facility], [place] [address] [place], [place] [address]    Report Status 04/10/2017 FINAL  Final  MRSA PCR Screening     Status: None   Collection Time: 03/14/2017  6:13 PM  Result Value Ref Range Status   MRSA by PCR NEGATIVE  NEGATIVE Final    Comment:        The GeneXpert MRSA Assay (FDA approved for NASAL specimens only), is one component of a comprehensive MRSA colonization surveillance program. It is not intended to diagnose MRSA infection nor to guide or monitor treatment for MRSA infections.   Culture, blood (Routine X 2) w Reflex to ID Panel     Status: None   Collection Time: 03/17/2017  6:25 PM  Result Value Ref Range Status   Specimen Description BLOOD LEFT ARM  Final   Special Requests IN PEDIATRIC BOTTLE Blood Culture adequate volume  Final   Culture   Final    NO GROWTH 5 DAYS Performed at [facility], [address] [place], [place] [address]    Report Status 04/10/2017 FINAL  Final      Radiology Studies: No results found.    Scheduled Meds: . dexamethasone  4 mg Intravenous Q12H  . heparin subQ  5,000 Units Subcutaneous Q8H  . lactulose  20 g Oral TID   Continuous Infusions: . dextrose 5 % and 0.45% NaCl 75 mL/hr at 04/14/17 1200  . fentaNYL infusion INTRAVENOUS 30 mcg/hr (04/14/17 1200)     LOS: 9 days    Time spent: 25 minutes  Greater than 50% of the time spent on counseling and coordinating the care.   Domenic Polite, MD [facility] Pager [phone]  If 7PM-7AM, please contact  night-coverage [email or web address] Password [email or web address] 04/14/2017, 1:00 PM

## 2017-05-09 NOTE — Progress Notes (Signed)
Pt. Husband was concerned that everything is not being done medical necessary. He wanted to change pt to full code, stated they wanted everything done except chest compressions. MD was notified. Sodium bicarb was ordered and husband refused the sodium bicarb. Stated he wanted vasopressors instead. MD was notified and spoke to family about realistic goals of care. Critical care was also notified. Family refused bipap. No vasopressors were ordered and patient remained DNR.

## 2017-05-09 NOTE — Progress Notes (Signed)
Postmortem care was performed on patient.  Upon performing care, patient was noted to have jewelry in place, bracelet on right wrist, watch on left hand, and wedding ring on left ring finger. Family is not present to give the jewelry to.  Jewelry will remain on patient.

## 2017-05-09 NOTE — Discharge Summary (Signed)
Death Summary  Christina [name] HWE:[id number] DOB: [birth year] DOA: 04-20-2017  PCP: Benito Mccreedy, MD  Admit date: 2017-04-20 Date of Death: 04-30-2017  Final Diagnoses:    Sepsis   Widely Metastatic Colon cancer   Hypertension   Metastatic colon cancer to liver ([place])   Dehydration   Weakness   Sepsis ([place])   Acute metabolic encephalopathy   Hyperkalemia   Acute kidney injury ([place])   Transaminitis   Elevated bilirubin   Lower extremity edema   Encounter for palliative care   History of present illness:  56 year old female with metastatic colon cancer, thalassemia trait, systolic CHF with EF 89-38%, last received chemo in 02/2017, hospitalized 08/15 - 808/19 for dehydration, AKI, hypercalcemia and weakness who presented to ED due to ongoing abdominal pain despite    Hospital Course:  Sepsis due to likely intra-abdominal source -She completed 7 days of IV Zosyn without any clinical improvement, antibiotics stopped 9/4 by Dr. Charlies Silvers after an eight-day course, and especially given the patient is near end-of-life  Metastatic colon cancer -With extensive metastases to liver, lungs, subcutaneous masses, suspected malignant ascites  now near end-of-life with encephalopathy, severe third spacing/anasarca, hypotension and multisystem failure  -Followed by Palliative care and Dr.Sherril, Family was been told multiple times that her prognosis is likely hours, they kept hoping for a miracle - FInally labs stopped and she was started on a fentanyl drip for comfort subsequently expired on 05/01/2023  Hyperkalemia due to [facility] possibly tissue necrosis from large mets - Sodium bicarb amps given earlier this admission   Coffee ground emesis - Has had coffea ground emesis off and on since 9/2, Had NG tube  - treated with PPI,  supportive care, stopped blood draws  AKI with metabolic acidosis - Due to dehydration - No further blood work  Systolic congestive heart failure, EF 35-40% -  Agonal breathing  Acute metabolic encephalopathy - Due to metastatic liver disease, advanced cancer  Transaminitis / Elevated bilirubin - Due to metastatic lesions in the liver  Lower extremity edema - Likely third spacing from malignancy, hypoalbuminemia     Time: 56min  Signed:  Britani Beattie  [facility] 04/28/2017, 3:11 PM

## 2017-05-09 NOTE — Progress Notes (Signed)
Patient's monitor alarmed "asystole." This RN and Buren Kos, RN went into the room and made the family aware.  Confirmation was performed via auscultation.  Time of death was called at 49.  Family remains at bedside; they are accepting of the situation, and they are aware of the support that is available to them.    Staff will continue to provide support to the family.

## 2017-05-09 NOTE — Progress Notes (Signed)
Pt vitals- BP 36/19, HR 30's-40s, O2 Sat currently 82% on 15 L NRB. [employer] called with concern for Pt cardiac status. RN informed them that Pt is actively dying, however family is unwilling to switch Pt to "comfort care" status.

## 2017-05-09 DEATH — deceased

## 2017-05-20 ENCOUNTER — Other Ambulatory Visit: Payer: Self-pay | Admitting: Nurse Practitioner

## 2017-05-24 IMAGING — CR DG CHEST 2V
3 series · 3 of 3 positions shown · non-contrast
Comparison: April 08, 2015

CLINICAL DATA: Shortness of breath and nausea.  Chest pain

EXAM:
CHEST  2 VIEW

[w chest pa]
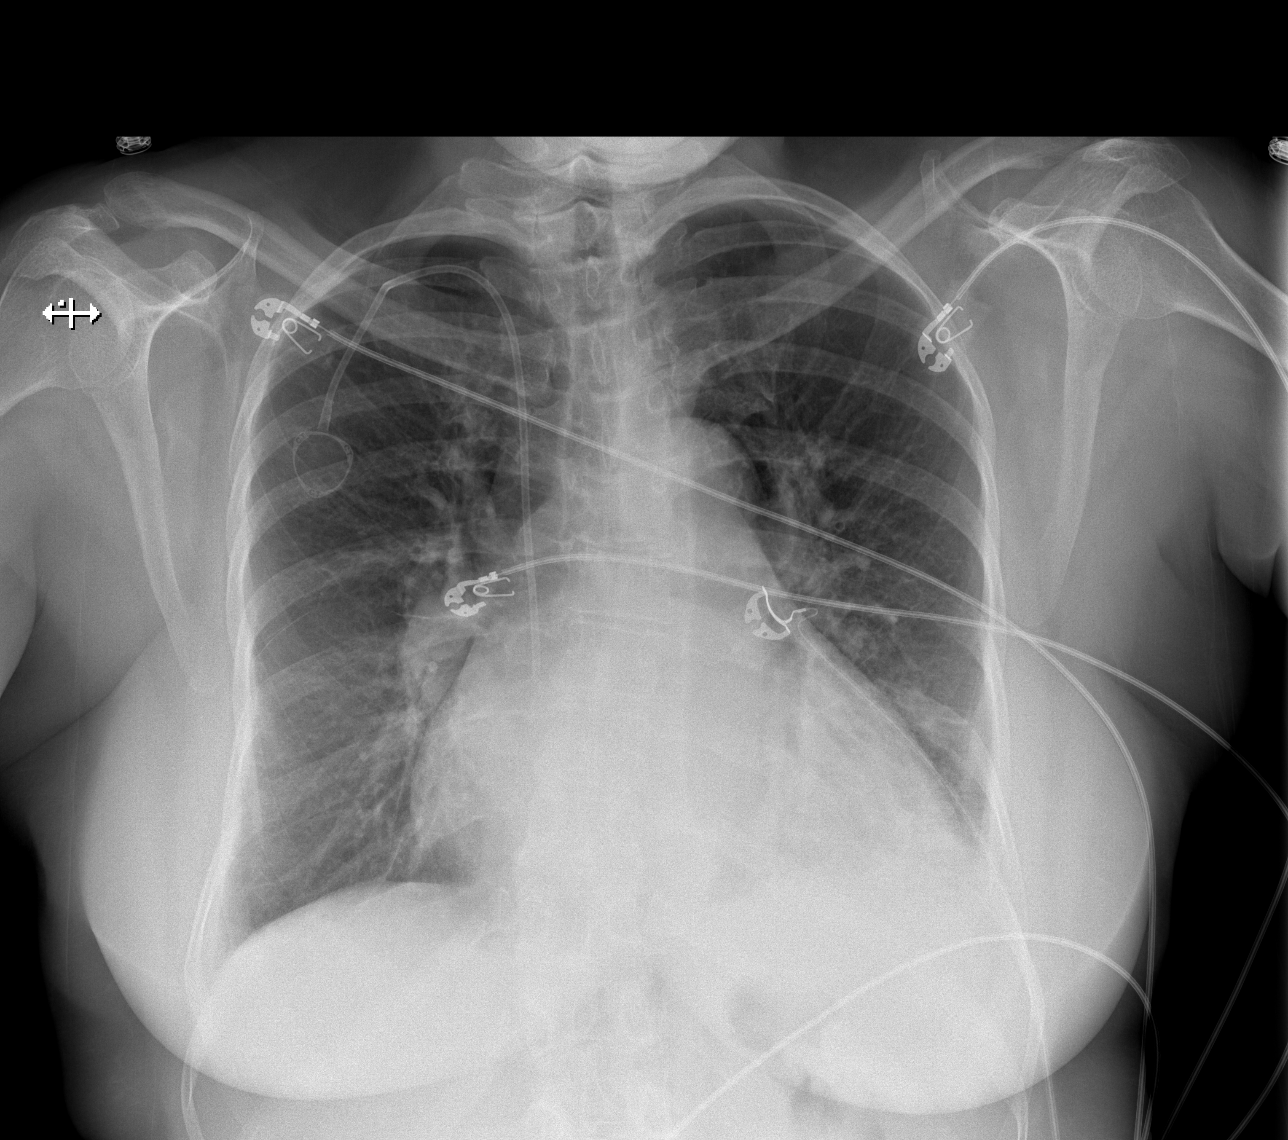

[w chest lat (1 of 2)]
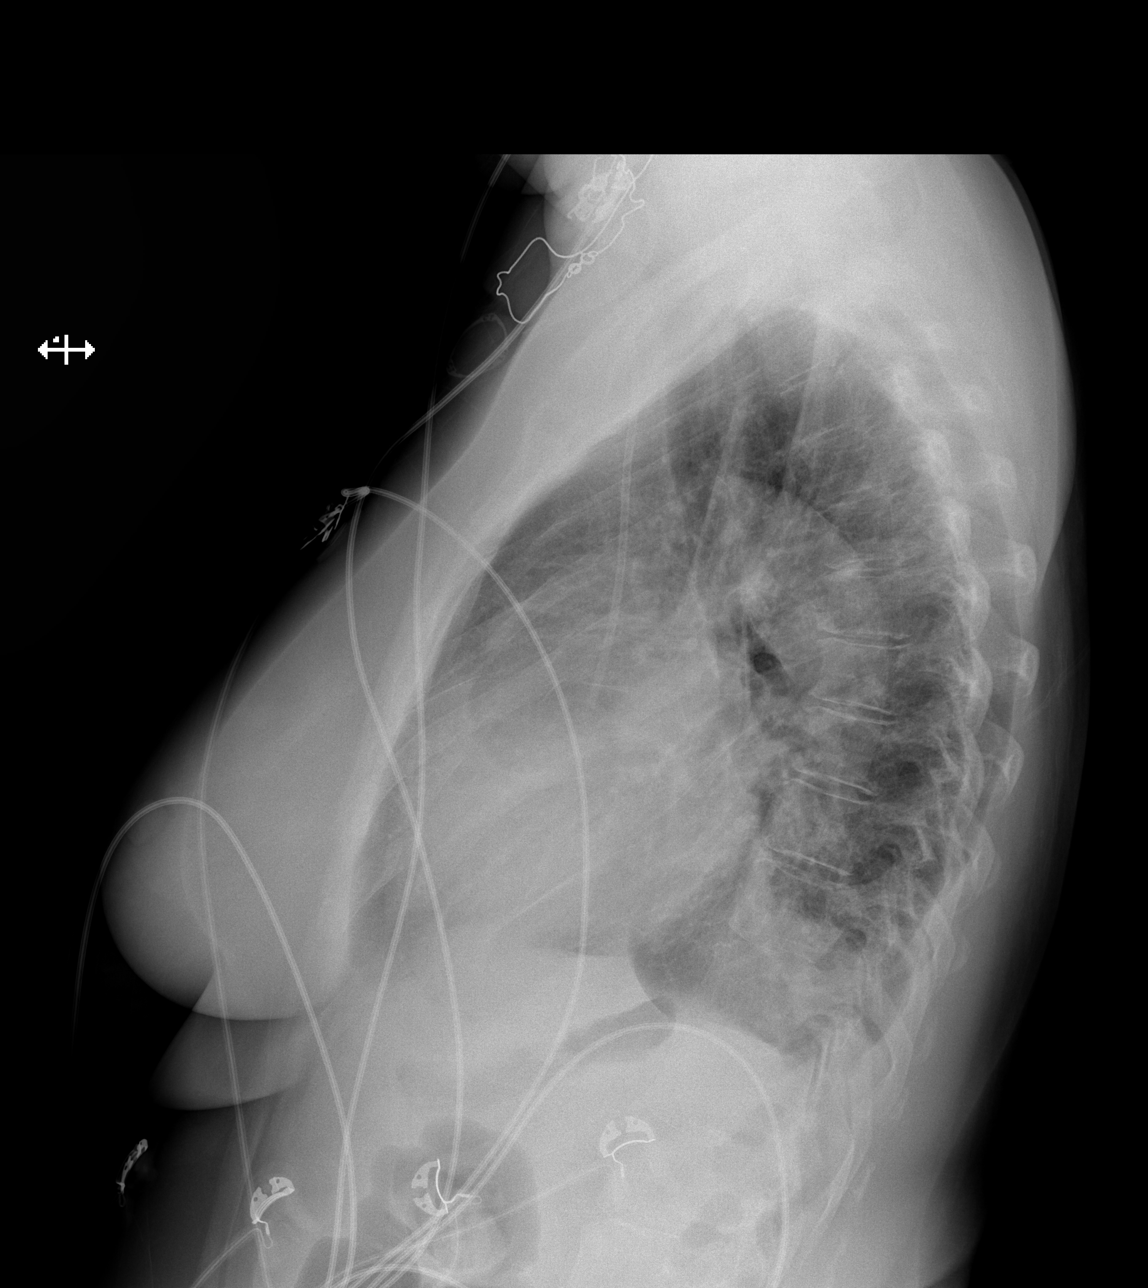

[w chest lat (2 of 2)]
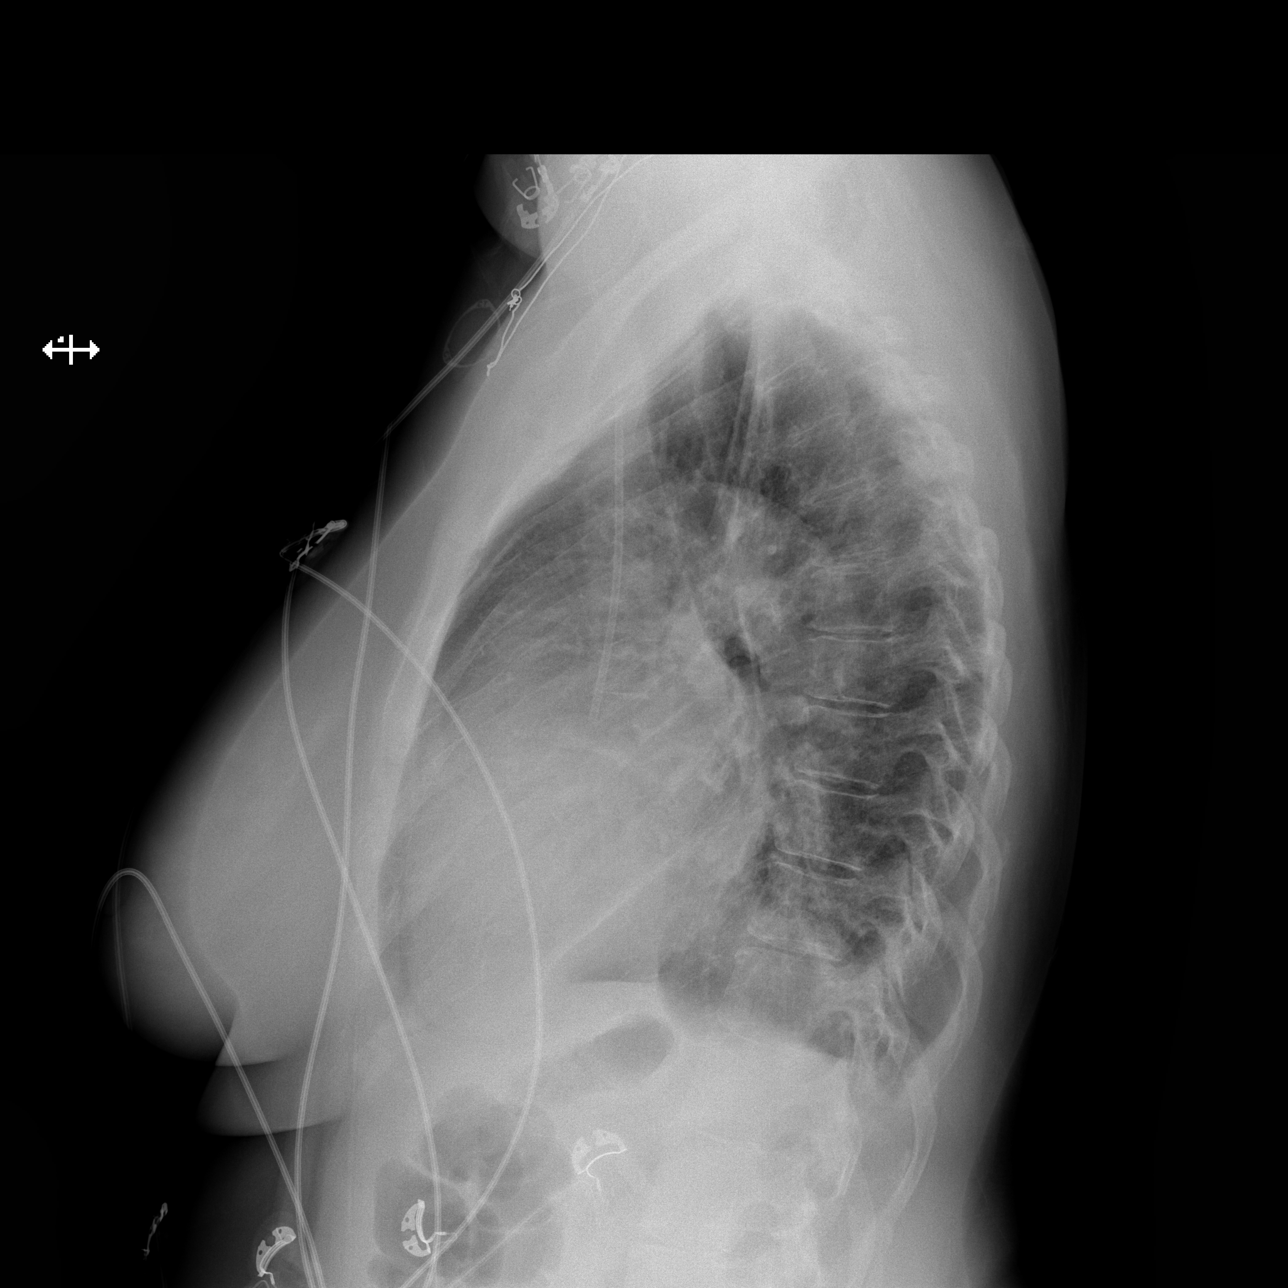

[3 of 3 positions shown; findings below may reference images not displayed]

FINDINGS: There is patchy consolidation in the posterior left base region.
Lungs elsewhere clear. There is cardiomegaly with mild pulmonary
venous hypertension. No adenopathy. Port-A-Cath tip is in the
superior vena cava. No pneumothorax. No bone lesions.
IMPRESSION: Posterior segment left lower lobe airspace consolidation consistent
with pneumonia. Cardiomegaly with mild pulmonary venous hypertension
consistent with a degree of pulmonary vascular congestion. No edema.

Port-A-Cath tip in superior vena cava.  No pneumothorax.

Followup PA and lateral chest radiographs recommended in 3-4 weeks
following trial of antibiotic therapy to ensure resolution and
exclude underlying malignancy.

## 2017-09-13 IMAGING — US US RENAL
1 series · 14 of 25 positions shown · non-contrast
Comparison: CT 02/21/2017

CLINICAL DATA: 55-year-old female with renal failure. History of
metastatic colon cancer.

EXAM:
RENAL / URINARY TRACT ULTRASOUND COMPLETE

[Series 1: us renal · 0.33mm/px · 14 of 32 slices shown]
[im 1/32]
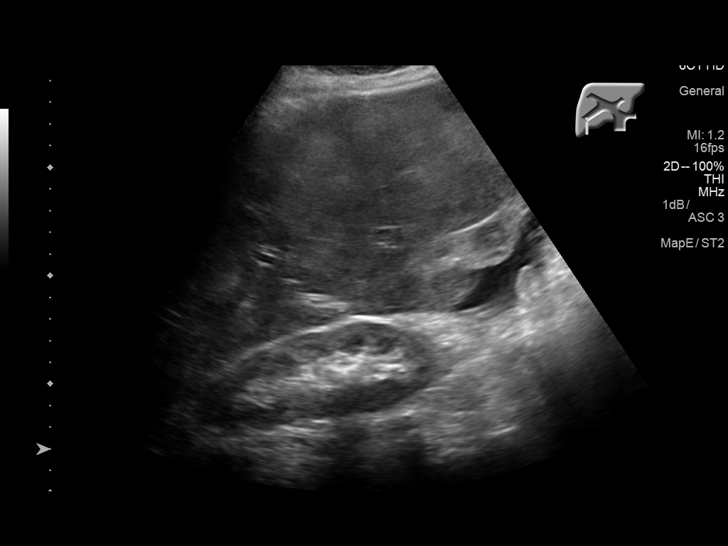
[im 3/32]
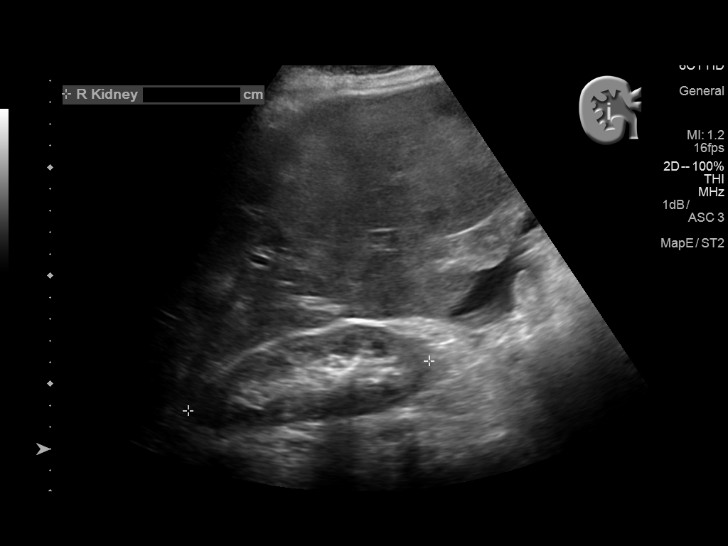
[im 6/32]
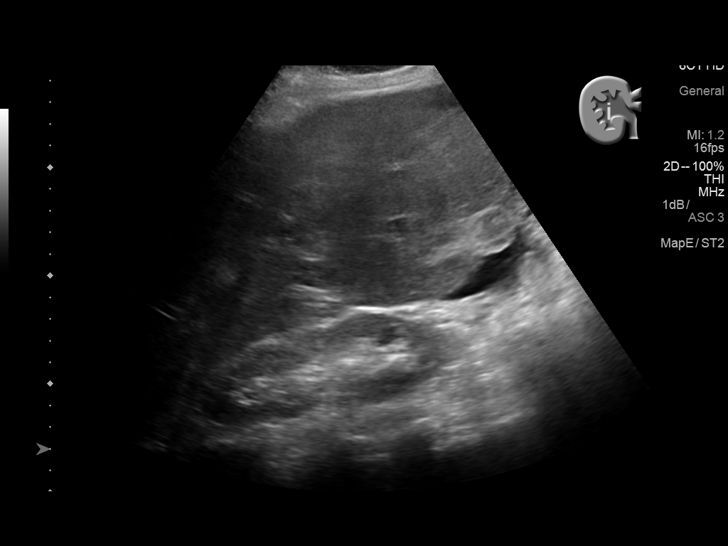
[im 8/32]
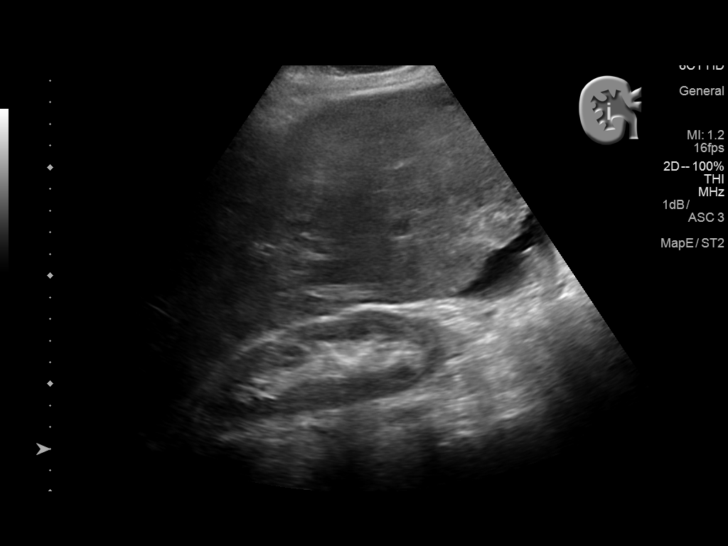
[im 11/32]
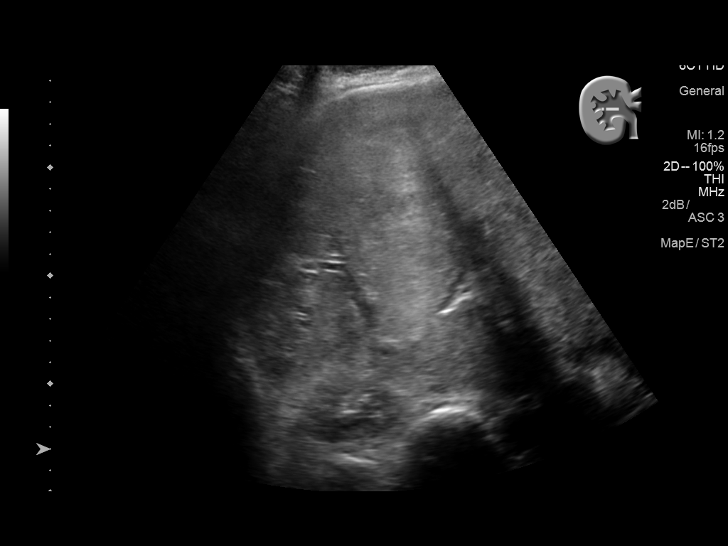
[im 12/32]
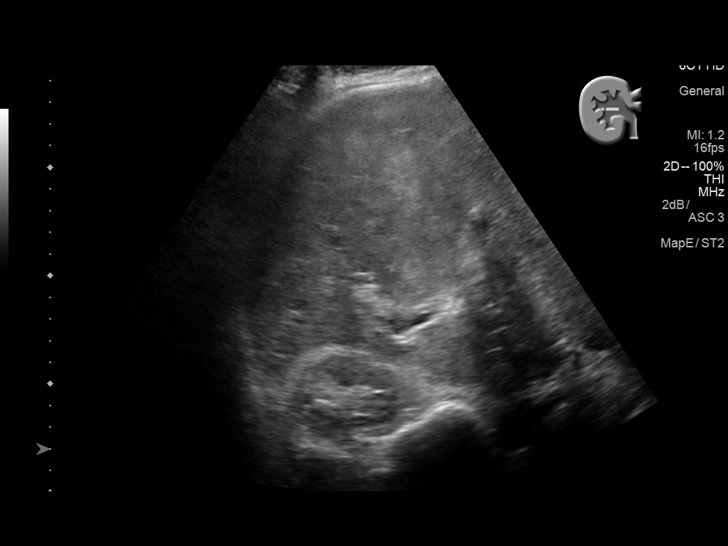
[im 15/32]
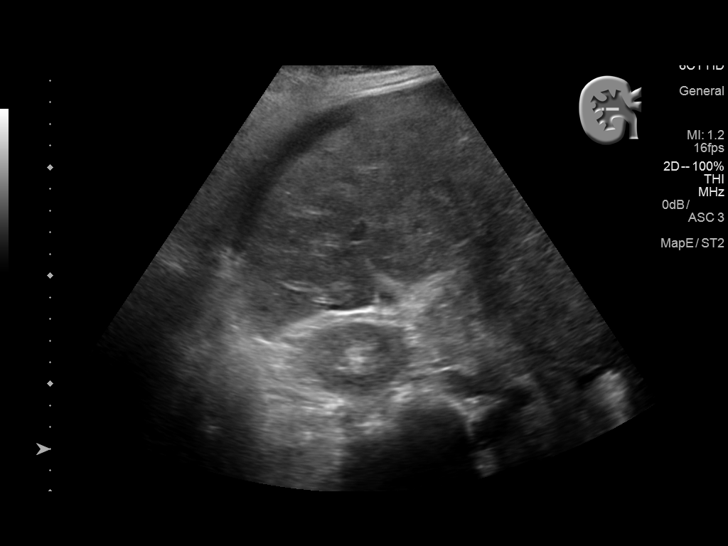
[im 17/32]
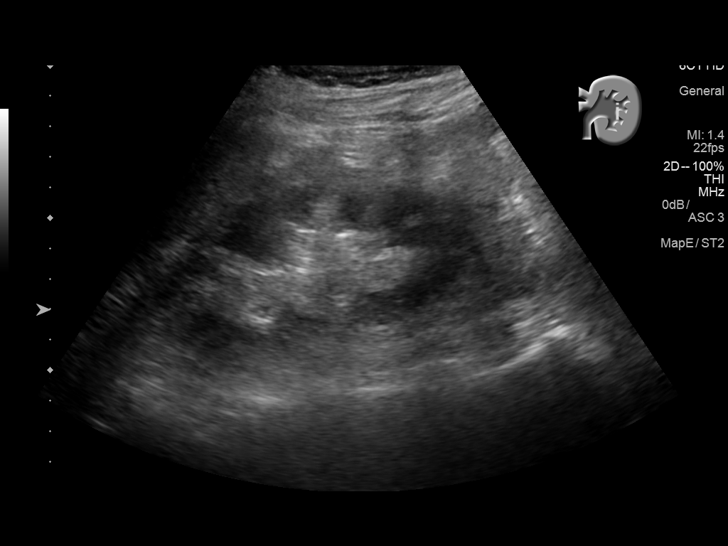
[im 20/32]
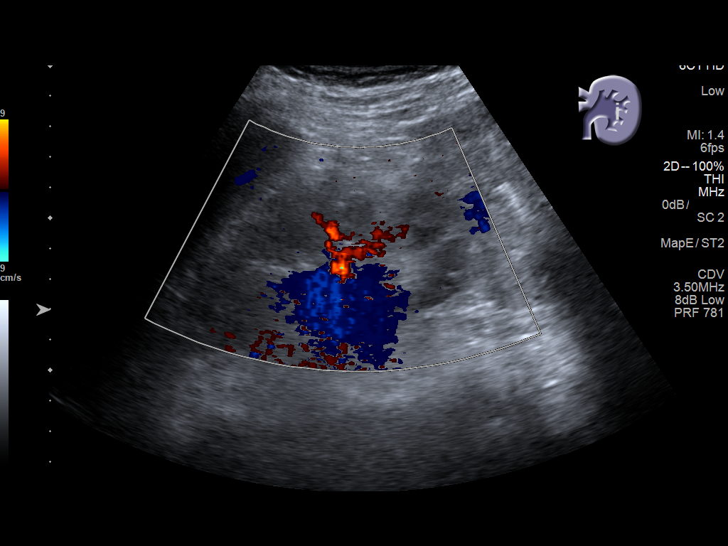
[im 21/32]
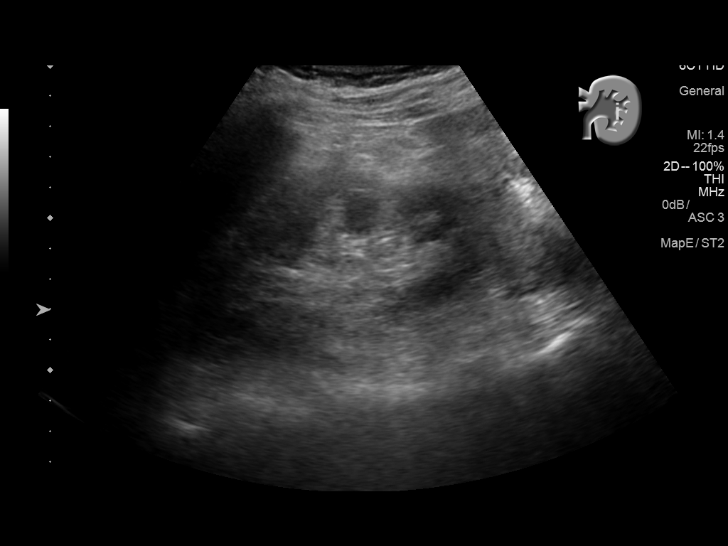
[im 24/32]
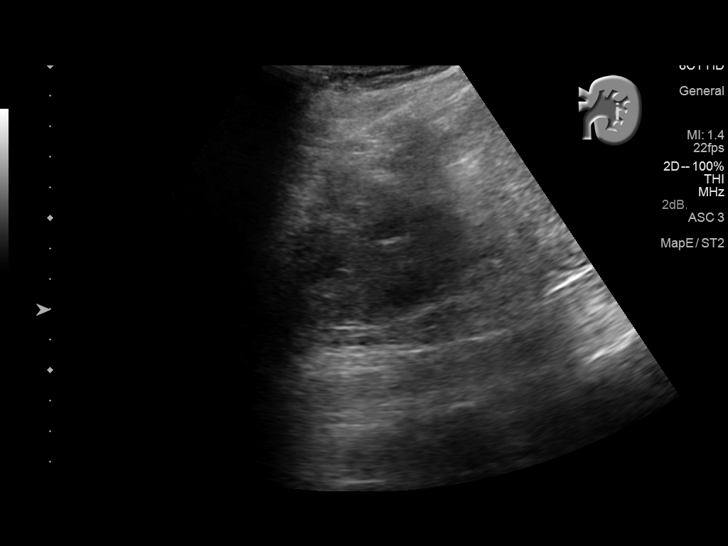
[im 26/32]
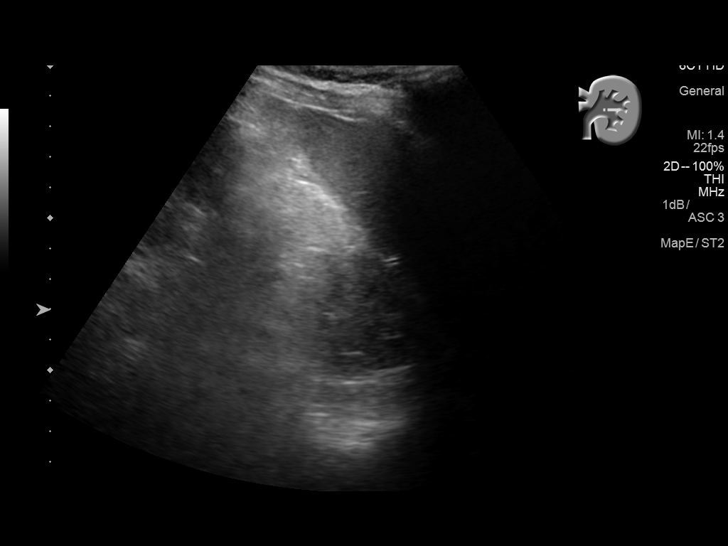
[im 29/32]
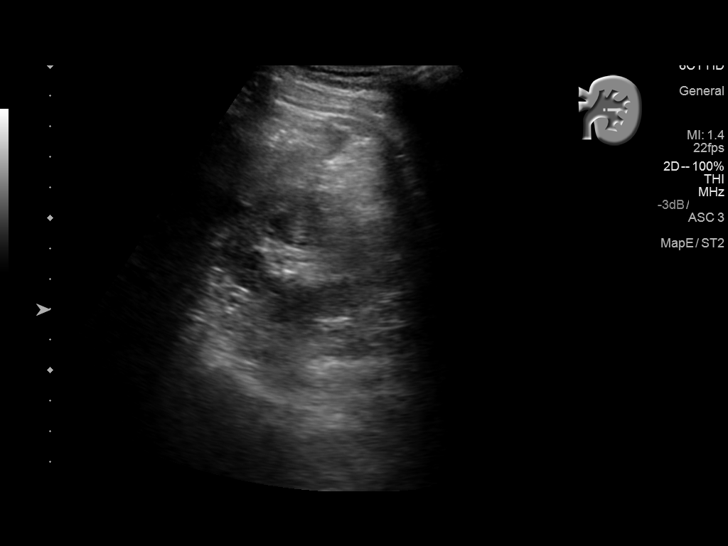
[im 32/32]
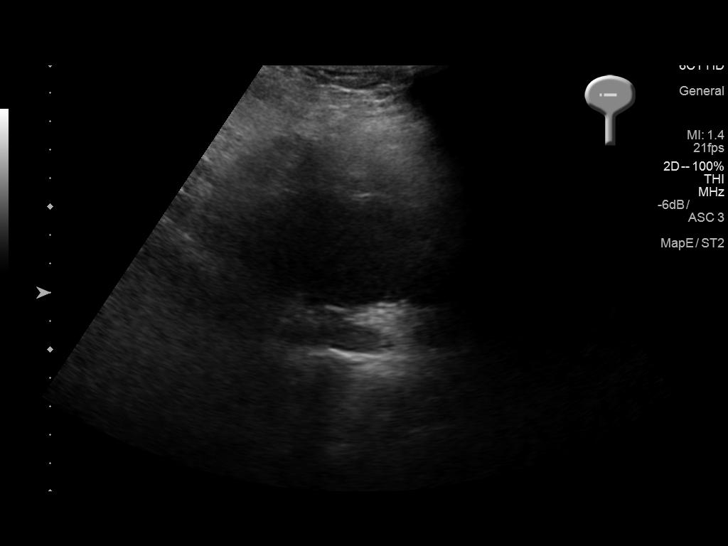

[14 of 25 positions shown; findings below may reference images not displayed]

FINDINGS: Right Kidney:

Length: 11.4 cm. Echogenicity within normal limits. No mass or
hydronephrosis visualized.

Left Kidney:

Length: 11.3 cm. Echogenicity within normal limits. No mass or
hydronephrosis visualized.

Bladder:

Poorly distended urinary bladder is not well evaluated.
IMPRESSION: 1. Unremarkable sonographic evaluation of the bilateral kidneys. No
evidence for obstruction/hydronephrosis.
2. Incomplete evaluation of the decompressed urinary bladder.

## 2018-02-28 IMAGING — DX DG CHEST 2V
2 series · 2 of 2 positions shown · non-contrast
Comparison: 02/19/2017, PET-CT 09/24/2016

CLINICAL DATA: Tachycardia

EXAM:
CHEST  2 VIEW

[chest lat]
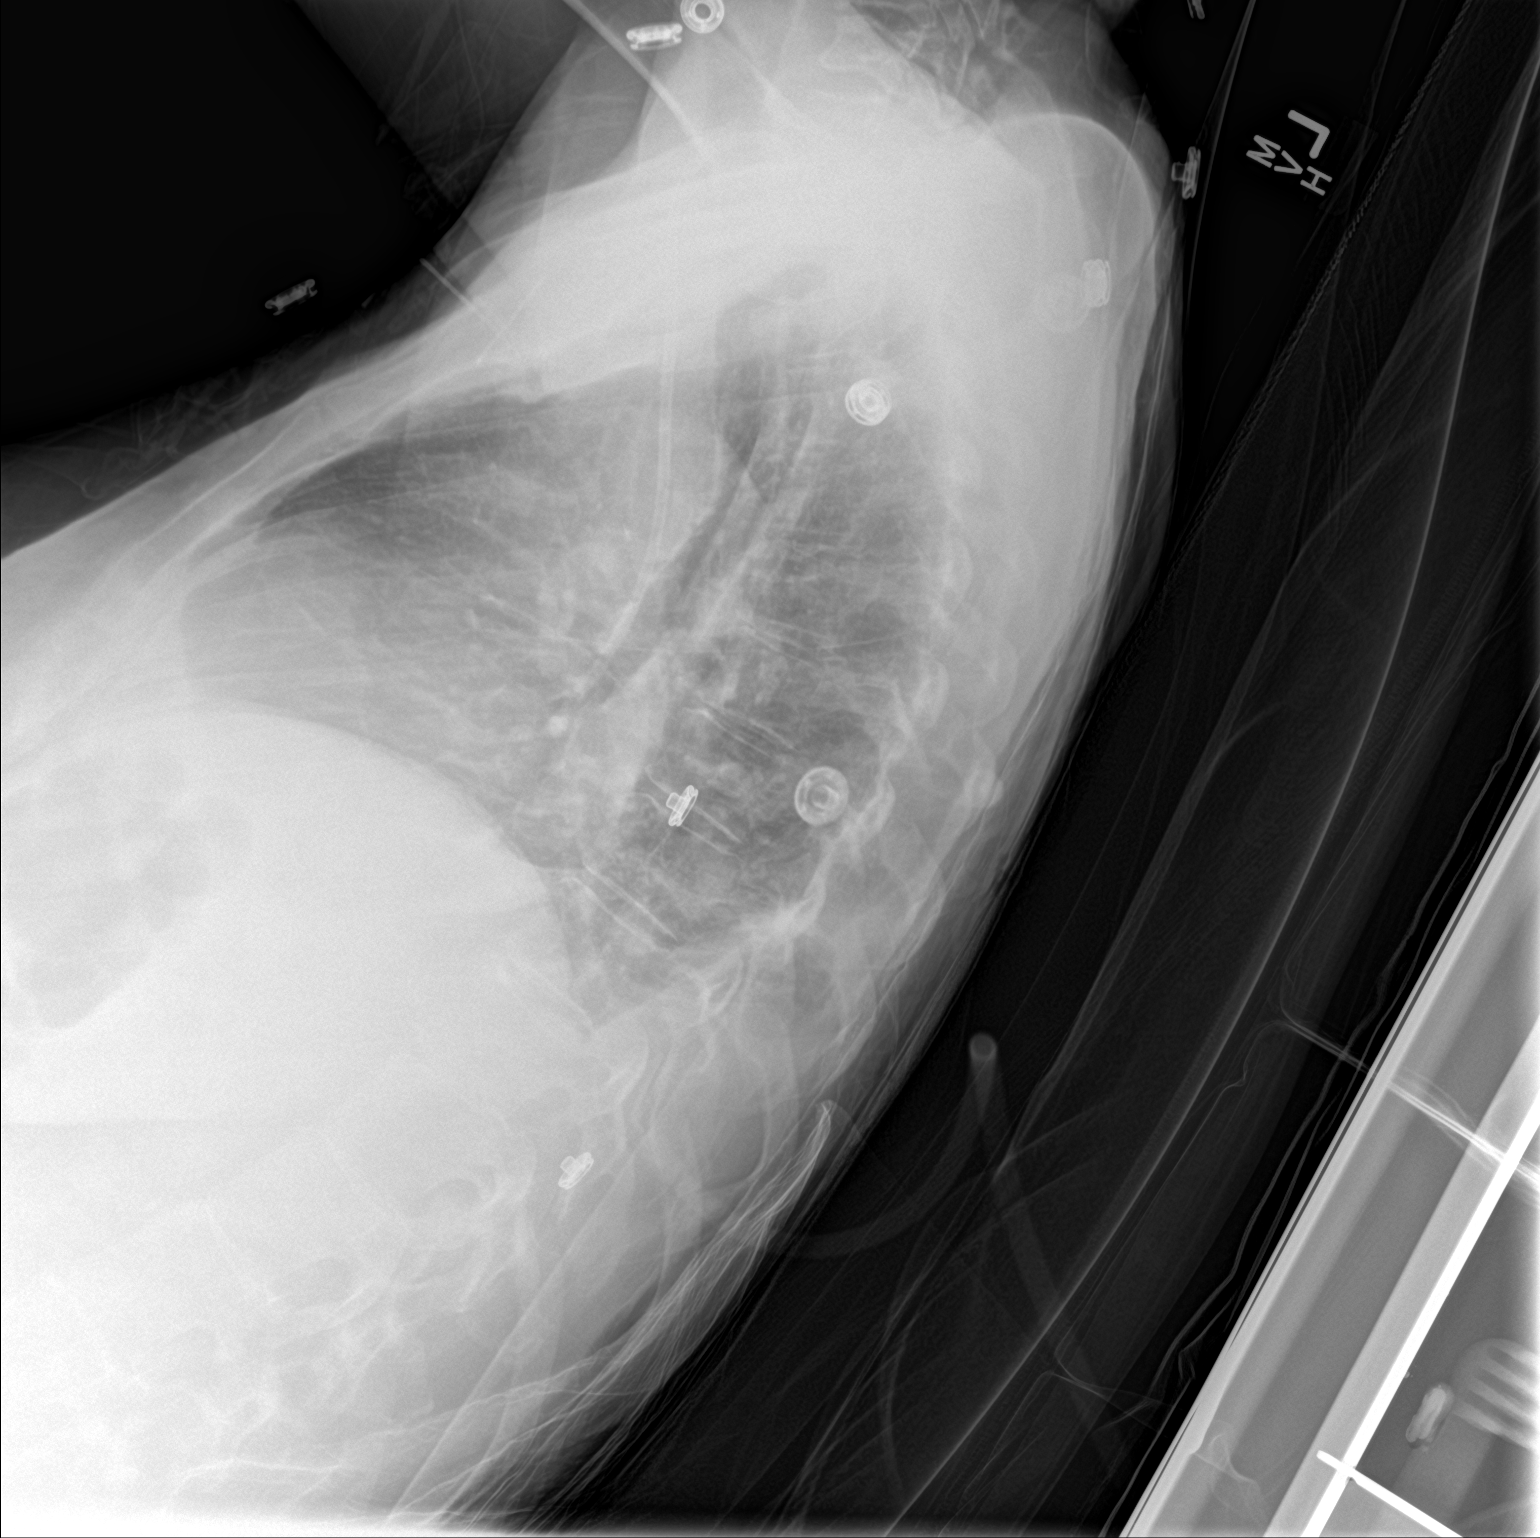

[chest ap]
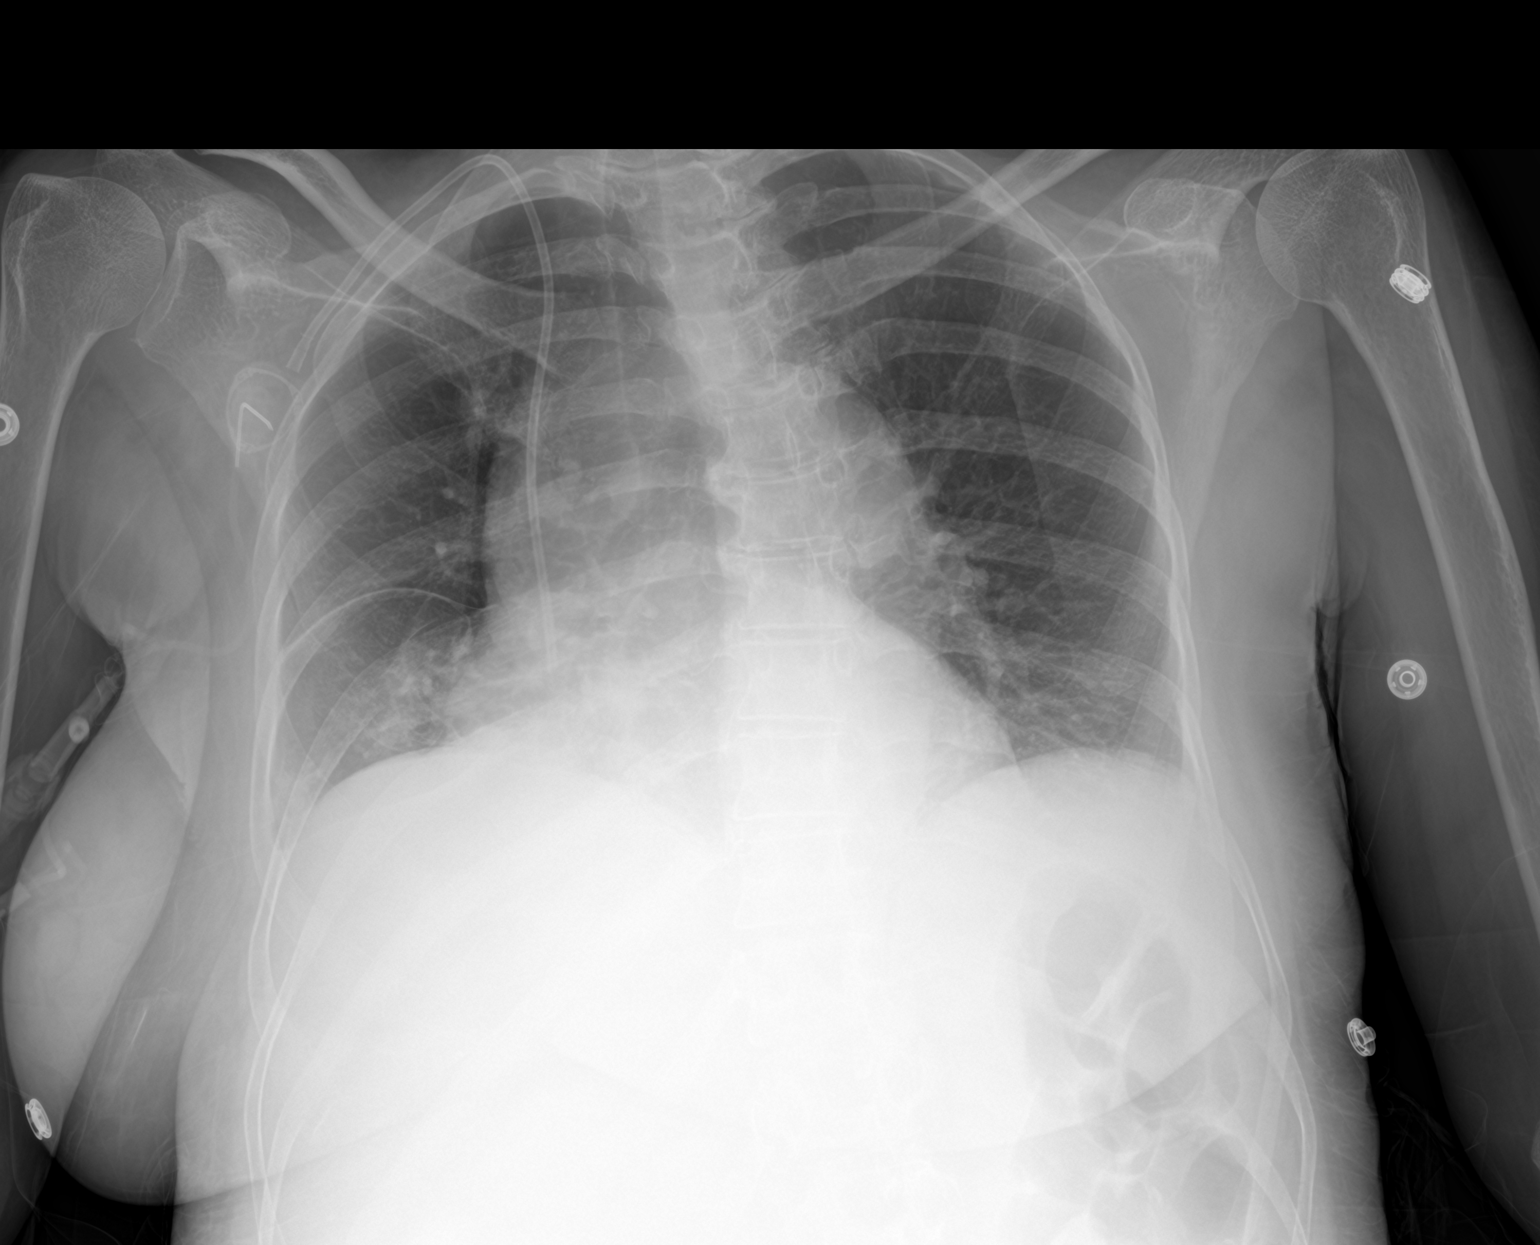

[2 of 2 positions shown; findings below may reference images not displayed]

FINDINGS: Right-sided central venous port tip overlies the proximal right
atrium. Trace pleural effusion. Improved aeration of right base
since the prior study. Stable enlarged cardiomediastinal silhouette.
Slight asymmetric lucency along the right mediastinal border.
IMPRESSION: 1. Trace bilateral effusions, improved aeration of right lung base
since prior radiograph. Patchy residual atelectasis or infiltrate at
the right base
2. Asymmetric linear lucency along the right mediastinal contour,
probable artifact, with tiny anterior pneumothorax felt unlikely
